# Patient Record
Sex: Female | Born: 1935 | Race: Black or African American | Hispanic: No | Marital: Single | Smoking: Never smoker
Health system: Southern US, Community
[De-identification: ages and names within clinical notes are randomized; demographics above are authoritative.]

## PROBLEM LIST (undated history)

## (undated) DIAGNOSIS — M949 Disorder of cartilage, unspecified: Secondary | ICD-10-CM

## (undated) DIAGNOSIS — Z86010 Personal history of colonic polyps: Secondary | ICD-10-CM

## (undated) DIAGNOSIS — M109 Gout, unspecified: Secondary | ICD-10-CM

## (undated) DIAGNOSIS — R42 Dizziness and giddiness: Secondary | ICD-10-CM

## (undated) DIAGNOSIS — D509 Iron deficiency anemia, unspecified: Secondary | ICD-10-CM

## (undated) DIAGNOSIS — E119 Type 2 diabetes mellitus without complications: Secondary | ICD-10-CM

## (undated) DIAGNOSIS — N259 Disorder resulting from impaired renal tubular function, unspecified: Secondary | ICD-10-CM

## (undated) DIAGNOSIS — Z905 Acquired absence of kidney: Secondary | ICD-10-CM

## (undated) DIAGNOSIS — C649 Malignant neoplasm of unspecified kidney, except renal pelvis: Secondary | ICD-10-CM

## (undated) DIAGNOSIS — I10 Essential (primary) hypertension: Secondary | ICD-10-CM

## (undated) DIAGNOSIS — G4710 Hypersomnia, unspecified: Secondary | ICD-10-CM

## (undated) DIAGNOSIS — M899 Disorder of bone, unspecified: Secondary | ICD-10-CM

## (undated) DIAGNOSIS — F411 Generalized anxiety disorder: Secondary | ICD-10-CM

## (undated) DIAGNOSIS — M1710 Unilateral primary osteoarthritis, unspecified knee: Secondary | ICD-10-CM

## (undated) DIAGNOSIS — S82401A Unspecified fracture of shaft of right fibula, initial encounter for closed fracture: Secondary | ICD-10-CM

## (undated) DIAGNOSIS — F0390 Unspecified dementia without behavioral disturbance: Secondary | ICD-10-CM

## (undated) DIAGNOSIS — G4733 Obstructive sleep apnea (adult) (pediatric): Secondary | ICD-10-CM

## (undated) DIAGNOSIS — N390 Urinary tract infection, site not specified: Secondary | ICD-10-CM

## (undated) DIAGNOSIS — K219 Gastro-esophageal reflux disease without esophagitis: Secondary | ICD-10-CM

## (undated) DIAGNOSIS — E785 Hyperlipidemia, unspecified: Secondary | ICD-10-CM

## (undated) DIAGNOSIS — M79609 Pain in unspecified limb: Secondary | ICD-10-CM

## (undated) HISTORY — DX: Iron deficiency anemia, unspecified: D50.9

## (undated) HISTORY — DX: Hypersomnia, unspecified: G47.10

## (undated) HISTORY — DX: Pain in unspecified limb: M79.609

## (undated) HISTORY — DX: Gout, unspecified: M10.9

## (undated) HISTORY — DX: Generalized anxiety disorder: F41.1

## (undated) HISTORY — DX: Acquired absence of kidney: Z90.5

## (undated) HISTORY — DX: Type 2 diabetes mellitus without complications: E11.9

## (undated) HISTORY — DX: Personal history of colonic polyps: Z86.010

## (undated) HISTORY — DX: Unilateral primary osteoarthritis, unspecified knee: M17.10

## (undated) HISTORY — DX: Obstructive sleep apnea (adult) (pediatric): G47.33

## (undated) HISTORY — DX: Essential (primary) hypertension: I10

## (undated) HISTORY — DX: Dizziness and giddiness: R42

## (undated) HISTORY — DX: Disorder of cartilage, unspecified: M94.9

## (undated) HISTORY — DX: Disorder resulting from impaired renal tubular function, unspecified: N25.9

## (undated) HISTORY — DX: Gastro-esophageal reflux disease without esophagitis: K21.9

## (undated) HISTORY — PX: OTHER SURGICAL HISTORY: SHX169

## (undated) HISTORY — DX: Disorder of bone, unspecified: M89.9

## (undated) HISTORY — DX: Hyperlipidemia, unspecified: E78.5

---

## 1997-11-22 ENCOUNTER — Emergency Department (HOSPITAL_COMMUNITY): Admission: EM | Admit: 1997-11-22 | Discharge: 1997-11-23 | Payer: Self-pay | Admitting: Internal Medicine

## 1997-11-22 ENCOUNTER — Emergency Department (HOSPITAL_COMMUNITY): Admission: EM | Admit: 1997-11-22 | Discharge: 1997-11-22 | Payer: Self-pay | Admitting: Internal Medicine

## 1998-01-11 ENCOUNTER — Inpatient Hospital Stay (HOSPITAL_COMMUNITY): Admission: RE | Admit: 1998-01-11 | Discharge: 1998-01-15 | Payer: Self-pay | Admitting: Urology

## 1999-01-10 ENCOUNTER — Other Ambulatory Visit: Admission: RE | Admit: 1999-01-10 | Discharge: 1999-01-10 | Payer: Self-pay | Admitting: Obstetrics and Gynecology

## 2000-12-08 ENCOUNTER — Inpatient Hospital Stay (HOSPITAL_COMMUNITY): Admission: EM | Admit: 2000-12-08 | Discharge: 2000-12-11 | Payer: Self-pay | Admitting: Emergency Medicine

## 2002-11-06 ENCOUNTER — Emergency Department (HOSPITAL_COMMUNITY): Admission: EM | Admit: 2002-11-06 | Discharge: 2002-11-06 | Payer: Self-pay | Admitting: Emergency Medicine

## 2002-11-06 ENCOUNTER — Encounter: Payer: Self-pay | Admitting: Emergency Medicine

## 2005-02-12 ENCOUNTER — Ambulatory Visit: Payer: Self-pay | Admitting: Internal Medicine

## 2005-06-05 ENCOUNTER — Inpatient Hospital Stay (HOSPITAL_COMMUNITY): Admission: EM | Admit: 2005-06-05 | Discharge: 2005-06-11 | Payer: Self-pay | Admitting: Emergency Medicine

## 2005-06-05 ENCOUNTER — Ambulatory Visit: Payer: Self-pay | Admitting: Internal Medicine

## 2005-06-24 ENCOUNTER — Ambulatory Visit: Payer: Self-pay | Admitting: Internal Medicine

## 2005-08-08 ENCOUNTER — Ambulatory Visit: Payer: Self-pay | Admitting: Internal Medicine

## 2005-12-01 ENCOUNTER — Ambulatory Visit: Payer: Self-pay | Admitting: Internal Medicine

## 2006-09-01 ENCOUNTER — Ambulatory Visit: Payer: Self-pay | Admitting: Internal Medicine

## 2006-09-01 LAB — CONVERTED CEMR LAB
Cholesterol: 145 mg/dL (ref 0–200)
HDL: 29.4 mg/dL — ABNORMAL LOW (ref >39.0)
Hgb A1c MFr Bld: 7.3 % — ABNORMAL HIGH (ref 4.6–6.0)
LDL Cholesterol: 94 mg/dL (ref 0–99)
Pro B Natriuretic peptide (BNP): 42 pg/mL (ref 0.0–100.0)
Total CHOL/HDL Ratio: 4.9
Triglycerides: 108 mg/dL (ref 0–149)
VLDL: 22 mg/dL (ref 0–40)

## 2007-04-22 DIAGNOSIS — E119 Type 2 diabetes mellitus without complications: Secondary | ICD-10-CM

## 2007-04-22 DIAGNOSIS — Z860100 Personal history of colon polyps, unspecified: Secondary | ICD-10-CM | POA: Insufficient documentation

## 2007-04-22 DIAGNOSIS — E785 Hyperlipidemia, unspecified: Secondary | ICD-10-CM

## 2007-04-22 DIAGNOSIS — D509 Iron deficiency anemia, unspecified: Secondary | ICD-10-CM | POA: Insufficient documentation

## 2007-04-22 DIAGNOSIS — G4733 Obstructive sleep apnea (adult) (pediatric): Secondary | ICD-10-CM

## 2007-04-22 DIAGNOSIS — N259 Disorder resulting from impaired renal tubular function, unspecified: Secondary | ICD-10-CM

## 2007-04-22 DIAGNOSIS — M1710 Unilateral primary osteoarthritis, unspecified knee: Secondary | ICD-10-CM | POA: Insufficient documentation

## 2007-04-22 DIAGNOSIS — Z905 Acquired absence of kidney: Secondary | ICD-10-CM

## 2007-04-22 DIAGNOSIS — M109 Gout, unspecified: Secondary | ICD-10-CM

## 2007-04-22 DIAGNOSIS — K219 Gastro-esophageal reflux disease without esophagitis: Secondary | ICD-10-CM

## 2007-04-22 DIAGNOSIS — IMO0002 Reserved for concepts with insufficient information to code with codable children: Secondary | ICD-10-CM

## 2007-04-22 DIAGNOSIS — F411 Generalized anxiety disorder: Secondary | ICD-10-CM | POA: Insufficient documentation

## 2007-04-22 DIAGNOSIS — G4730 Sleep apnea, unspecified: Secondary | ICD-10-CM

## 2007-04-22 DIAGNOSIS — I10 Essential (primary) hypertension: Secondary | ICD-10-CM | POA: Insufficient documentation

## 2007-04-22 DIAGNOSIS — Z794 Long term (current) use of insulin: Secondary | ICD-10-CM

## 2007-04-22 HISTORY — DX: Obstructive sleep apnea (adult) (pediatric): G47.33

## 2007-04-22 HISTORY — DX: Hyperlipidemia, unspecified: E78.5

## 2007-04-22 HISTORY — DX: Disorder resulting from impaired renal tubular function, unspecified: N25.9

## 2007-04-22 HISTORY — DX: Iron deficiency anemia, unspecified: D50.9

## 2007-04-22 HISTORY — DX: Reserved for concepts with insufficient information to code with codable children: IMO0002

## 2007-04-22 HISTORY — DX: Generalized anxiety disorder: F41.1

## 2007-04-22 HISTORY — DX: Gout, unspecified: M10.9

## 2007-04-22 HISTORY — DX: Personal history of colon polyps, unspecified: Z86.0100

## 2007-04-22 HISTORY — DX: Essential (primary) hypertension: I10

## 2007-04-22 HISTORY — DX: Gastro-esophageal reflux disease without esophagitis: K21.9

## 2007-04-22 HISTORY — DX: Type 2 diabetes mellitus without complications: E11.9

## 2007-04-22 HISTORY — DX: Personal history of colonic polyps: Z86.010

## 2007-04-22 HISTORY — DX: Acquired absence of kidney: Z90.5

## 2007-05-06 ENCOUNTER — Emergency Department (HOSPITAL_COMMUNITY): Admission: EM | Admit: 2007-05-06 | Discharge: 2007-05-06 | Payer: Self-pay | Admitting: Emergency Medicine

## 2007-06-04 ENCOUNTER — Encounter: Payer: Self-pay | Admitting: Internal Medicine

## 2007-08-26 ENCOUNTER — Encounter: Payer: Self-pay | Admitting: Internal Medicine

## 2007-08-30 ENCOUNTER — Encounter: Payer: Self-pay | Admitting: Internal Medicine

## 2007-12-13 ENCOUNTER — Telehealth: Payer: Self-pay | Admitting: Internal Medicine

## 2007-12-20 ENCOUNTER — Encounter: Payer: Self-pay | Admitting: Internal Medicine

## 2007-12-23 ENCOUNTER — Encounter (INDEPENDENT_AMBULATORY_CARE_PROVIDER_SITE_OTHER): Payer: Self-pay | Admitting: *Deleted

## 2007-12-23 ENCOUNTER — Ambulatory Visit: Payer: Self-pay | Admitting: Internal Medicine

## 2007-12-23 DIAGNOSIS — M79609 Pain in unspecified limb: Secondary | ICD-10-CM

## 2007-12-23 HISTORY — DX: Pain in unspecified limb: M79.609

## 2007-12-27 ENCOUNTER — Telehealth (INDEPENDENT_AMBULATORY_CARE_PROVIDER_SITE_OTHER): Payer: Self-pay | Admitting: *Deleted

## 2008-01-05 ENCOUNTER — Encounter: Payer: Self-pay | Admitting: Internal Medicine

## 2008-03-06 ENCOUNTER — Telehealth: Payer: Self-pay | Admitting: Internal Medicine

## 2008-04-03 ENCOUNTER — Emergency Department (HOSPITAL_COMMUNITY): Admission: EM | Admit: 2008-04-03 | Discharge: 2008-04-03 | Payer: Self-pay | Admitting: Emergency Medicine

## 2008-05-06 ENCOUNTER — Emergency Department (HOSPITAL_COMMUNITY): Admission: EM | Admit: 2008-05-06 | Discharge: 2008-05-06 | Payer: Self-pay | Admitting: Emergency Medicine

## 2008-05-18 ENCOUNTER — Telehealth: Payer: Self-pay | Admitting: Internal Medicine

## 2008-07-11 ENCOUNTER — Telehealth (INDEPENDENT_AMBULATORY_CARE_PROVIDER_SITE_OTHER): Payer: Self-pay | Admitting: *Deleted

## 2008-11-30 ENCOUNTER — Telehealth: Payer: Self-pay | Admitting: Internal Medicine

## 2008-12-12 ENCOUNTER — Telehealth: Payer: Self-pay | Admitting: Internal Medicine

## 2009-01-04 ENCOUNTER — Encounter: Payer: Self-pay | Admitting: Internal Medicine

## 2009-01-11 ENCOUNTER — Ambulatory Visit: Payer: Self-pay | Admitting: Internal Medicine

## 2009-01-11 DIAGNOSIS — G4710 Hypersomnia, unspecified: Secondary | ICD-10-CM | POA: Insufficient documentation

## 2009-01-11 HISTORY — DX: Hypersomnia, unspecified: G47.10

## 2009-01-18 ENCOUNTER — Ambulatory Visit: Payer: Self-pay | Admitting: Pulmonary Disease

## 2009-01-27 ENCOUNTER — Encounter: Payer: Self-pay | Admitting: Internal Medicine

## 2009-02-09 ENCOUNTER — Telehealth: Payer: Self-pay | Admitting: Internal Medicine

## 2009-02-12 ENCOUNTER — Encounter: Payer: Self-pay | Admitting: Internal Medicine

## 2009-02-12 DIAGNOSIS — M899 Disorder of bone, unspecified: Secondary | ICD-10-CM | POA: Insufficient documentation

## 2009-02-12 DIAGNOSIS — M949 Disorder of cartilage, unspecified: Secondary | ICD-10-CM

## 2009-02-12 HISTORY — DX: Disorder of bone, unspecified: M89.9

## 2009-03-30 ENCOUNTER — Inpatient Hospital Stay (HOSPITAL_COMMUNITY): Admission: EM | Admit: 2009-03-30 | Discharge: 2009-04-05 | Payer: Self-pay | Admitting: Emergency Medicine

## 2009-03-30 ENCOUNTER — Ambulatory Visit: Payer: Self-pay | Admitting: Internal Medicine

## 2009-04-02 ENCOUNTER — Encounter (INDEPENDENT_AMBULATORY_CARE_PROVIDER_SITE_OTHER): Payer: Self-pay | Admitting: Internal Medicine

## 2009-04-02 ENCOUNTER — Ambulatory Visit: Payer: Self-pay | Admitting: Vascular Surgery

## 2009-04-13 ENCOUNTER — Ambulatory Visit: Payer: Self-pay | Admitting: Internal Medicine

## 2009-04-19 ENCOUNTER — Telehealth: Payer: Self-pay | Admitting: Internal Medicine

## 2009-04-20 ENCOUNTER — Ambulatory Visit: Payer: Self-pay | Admitting: Internal Medicine

## 2009-04-20 DIAGNOSIS — R42 Dizziness and giddiness: Secondary | ICD-10-CM

## 2009-04-20 HISTORY — DX: Dizziness and giddiness: R42

## 2009-04-21 ENCOUNTER — Encounter: Payer: Self-pay | Admitting: Internal Medicine

## 2009-04-23 ENCOUNTER — Telehealth: Payer: Self-pay | Admitting: Internal Medicine

## 2009-04-23 LAB — CONVERTED CEMR LAB
BUN: 31 mg/dL — ABNORMAL HIGH (ref 6–23)
Basophils Absolute: 0.1 10*3/uL (ref 0.0–0.1)
Basophils Relative: 0.8 % (ref 0.0–3.0)
CO2: 27 meq/L (ref 19–32)
Calcium: 10 mg/dL (ref 8.4–10.5)
Chloride: 107 meq/L (ref 96–112)
Creatinine, Ser: 1.3 mg/dL — ABNORMAL HIGH (ref 0.4–1.2)
Eosinophils Absolute: 0.3 10*3/uL (ref 0.0–0.7)
Eosinophils Relative: 3.6 % (ref 0.0–5.0)
Folate: 6.8 ng/mL
GFR calc non Af Amer: 51.62 mL/min (ref >60)
Glucose, Bld: 105 mg/dL — ABNORMAL HIGH (ref 70–99)
HCT: 33 % — ABNORMAL LOW (ref 36.0–46.0)
Hemoglobin: 11.3 g/dL — ABNORMAL LOW (ref 12.0–15.0)
Iron: 52 ug/dL (ref 42–145)
Lymphocytes Relative: 16.4 % (ref 12.0–46.0)
Lymphs Abs: 1.3 10*3/uL (ref 0.7–4.0)
MCHC: 34.3 g/dL (ref 30.0–36.0)
MCV: 89.7 fL (ref 78.0–100.0)
Monocytes Absolute: 0.5 10*3/uL (ref 0.1–1.0)
Monocytes Relative: 6.5 % (ref 3.0–12.0)
Neutro Abs: 5.9 10*3/uL (ref 1.4–7.7)
Neutrophils Relative %: 72.7 % (ref 43.0–77.0)
Platelets: 263 10*3/uL (ref 150.0–400.0)
Potassium: 4.3 meq/L (ref 3.5–5.1)
RBC: 3.68 M/uL — ABNORMAL LOW (ref 3.87–5.11)
RDW: 14.1 % (ref 11.5–14.6)
Saturation Ratios: 17.3 % — ABNORMAL LOW (ref 20.0–50.0)
Sodium: 142 meq/L (ref 135–145)
Transferrin: 215.3 mg/dL (ref 212.0–360.0)
Vitamin B-12: 355 pg/mL (ref 211–911)
WBC: 8.1 10*3/uL (ref 4.5–10.5)

## 2009-04-30 ENCOUNTER — Encounter: Payer: Self-pay | Admitting: Internal Medicine

## 2009-05-16 ENCOUNTER — Telehealth: Payer: Self-pay | Admitting: Internal Medicine

## 2009-05-24 ENCOUNTER — Telehealth: Payer: Self-pay | Admitting: Internal Medicine

## 2009-06-21 ENCOUNTER — Encounter: Payer: Self-pay | Admitting: Internal Medicine

## 2009-07-09 ENCOUNTER — Encounter: Payer: Self-pay | Admitting: Internal Medicine

## 2009-07-17 ENCOUNTER — Encounter: Payer: Self-pay | Admitting: Internal Medicine

## 2009-07-17 ENCOUNTER — Telehealth: Payer: Self-pay | Admitting: Internal Medicine

## 2009-07-25 ENCOUNTER — Encounter: Payer: Self-pay | Admitting: Internal Medicine

## 2009-10-23 ENCOUNTER — Encounter: Payer: Self-pay | Admitting: Internal Medicine

## 2009-11-09 ENCOUNTER — Encounter: Payer: Self-pay | Admitting: Internal Medicine

## 2009-11-22 ENCOUNTER — Encounter: Payer: Self-pay | Admitting: Internal Medicine

## 2010-01-31 ENCOUNTER — Telehealth: Payer: Self-pay | Admitting: Internal Medicine

## 2010-02-06 ENCOUNTER — Telehealth: Payer: Self-pay | Admitting: Internal Medicine

## 2010-03-28 ENCOUNTER — Encounter: Payer: Self-pay | Admitting: Internal Medicine

## 2010-06-02 ENCOUNTER — Observation Stay (HOSPITAL_COMMUNITY)
Admission: EM | Admit: 2010-06-02 | Discharge: 2010-06-04 | Payer: Self-pay | Source: Home / Self Care | Attending: Internal Medicine | Admitting: Internal Medicine

## 2010-06-23 ENCOUNTER — Encounter: Payer: Self-pay | Admitting: Internal Medicine

## 2010-06-24 ENCOUNTER — Encounter: Payer: Self-pay | Admitting: Internal Medicine

## 2010-07-04 NOTE — Progress Notes (Signed)
Summary: call expected  Phone Note Call from Patient Call back at Home Phone [phone]   Summary of Call: FYI--[facility], and they will give [place] a call in regards to the patient. Initial call taken by: Lucious Groves,  July 17, 2009 4:54 PM  Follow-up for Phone Call        Closed phone note, paperwork for patient diabetes supplies has been completed. Follow-up by: Lucious Groves,  July 23, 2009 10:30 AM

## 2010-07-04 NOTE — Medication Information (Signed)
Summary: Glucose Testing Supplies/[facility]  Glucose Testing Supplies/[facility]   Imported By: Sherian Rein 07/10/2009 10:46:51  _____________________________________________________________________  External Attachment:    Type:   Image     Comment:   External Document

## 2010-07-04 NOTE — Medication Information (Signed)
Summary: [facility]  [facility]   Imported By: Lester Armington 07/21/2009 08:22:14  _____________________________________________________________________  External Attachment:    Type:   Image     Comment:   External Document

## 2010-07-04 NOTE — Letter (Signed)
Summary: Request for medical records/[facility]  Request for medical records/[facility]   Imported By: Sherian Rein 11/26/2009 10:21:07  _____________________________________________________________________  External Attachment:    Type:   Image     Comment:   External Document

## 2010-07-04 NOTE — Medication Information (Signed)
[facility]   Imported By: Lester Perry 07/30/2009 08:03:13  _____________________________________________________________________  External Attachment:    Type:   Image     Comment:   External Document

## 2010-07-04 NOTE — Medication Information (Signed)
Summary: Diabetes Supplies / [facility]  Diabetes Supplies / [facility]   Imported By: Lennie Odor 04/01/2010 09:50:41  _____________________________________________________________________  External Attachment:    Type:   Image     Comment:   External Document

## 2010-07-04 NOTE — Medication Information (Signed)
Summary: [facility]   Imported By: Lester Woodland 11/13/2009 13:20:54  _____________________________________________________________________  External Attachment:    Type:   Image     Comment:   External Document

## 2010-07-04 NOTE — Progress Notes (Signed)
Summary: medication refll  Phone Note Refill Request Message from:  Fax from Pharmacy on January 31, 2010 10:18 AM  Refills Requested: Medication #1:  LORAZEPAM 1 MG  TABS 1 by mouth two times a day as needed nerves   Dosage confirmed as above?Dosage Confirmed   Last Refilled: 10/18/2009   Notes: [facility] [phone] Initial call taken by: Zella Ball Ewing CMA Duncan Dull),  January 31, 2010 10:19 AM  Follow-up for Phone Call        done hardcopy to [facility] side B - dahlia  Follow-up by: Corwin Levins MD,  January 31, 2010 1:20 PM  Additional Follow-up for Phone Call Additional follow up Details #1::        Rx faxed to pharmacy Additional Follow-up by: Margaret Pyle, CMA,  January 31, 2010 1:21 PM    New/Updated Medications: LORAZEPAM 1 MG  TABS (LORAZEPAM) 1 by mouth two times a day as needed nerves - needs return office visit for further refills please Prescriptions: LORAZEPAM 1 MG  TABS (LORAZEPAM) 1 by mouth two times a day as needed nerves - needs return office visit for further refills please  #60 x 2   Entered and Authorized by:   Corwin Levins MD   Signed by:   Corwin Levins MD on 01/31/2010   Method used:   Print then Give to Patient   RxID:   [id number]

## 2010-07-04 NOTE — Medication Information (Signed)
Summary: [facility]  [facility]  [facility]  [facility]   Imported By: Lester Cimarron 07/27/2009 07:28:59  _____________________________________________________________________  External Attachment:    Type:   Image     Comment:   External Document

## 2010-07-04 NOTE — Letter (Signed)
Summary: [facility]  [facility]   Imported By: Sherian Rein 10/25/2009 12:20:53  _____________________________________________________________________  External Attachment:    Type:   Image     Comment:   External Document

## 2010-07-04 NOTE — Medication Information (Signed)
Summary: Diabetic Supplies/[facility]  Di[facility]   Imported By: Sherian Rein 07/26/2009 12:53:59  _____________________________________________________________________  External Attachment:    Type:   Image     Comment:   External Document

## 2010-07-04 NOTE — Letter (Signed)
Summary: CMN for Diabetes Supplies/[facility]  CMN for Diabetes Supplies/[facility]   Imported By: Sherian Rein 06/26/2009 08:22:02  _____________________________________________________________________  External Attachment:    Type:   Image     Comment:   External Document

## 2010-07-04 NOTE — Progress Notes (Signed)
  Phone Note Refill Request Message from:  Fax from Pharmacy on February 06, 2010 3:26 PM  Refills Requested: Medication #1:  KLOR-CON M20 20 MEQ  TBCR 1 by mouth once daily   Dosage confirmed as above?Dosage Confirmed   Notes: [facility] Initial call taken by: Scharlene Gloss CMA Duncan Dull),  February 06, 2010 3:26 PM    Prescriptions: KLOR-CON M20 20 MEQ  TBCR (POTASSIUM CHLORIDE CRYS CR) 1 by mouth once daily  #180 x 0   Entered by:   Scharlene Gloss CMA (AAMA)   Authorized by:   Corwin Levins MD   Signed by:   Scharlene Gloss CMA (AAMA) on 02/06/2010   Method used:   Faxed to ...       [facility]* (retail)       [address]       [place], [place]  [id number]       Ph: [phone]       Fax: [phone]   RxID:   [id number]

## 2010-07-17 ENCOUNTER — Telehealth: Payer: Self-pay | Admitting: Internal Medicine

## 2010-07-24 NOTE — Progress Notes (Signed)
  Phone Note Refill Request Message from:  Fax from Pharmacy on July 17, 2010 4:48 PM  Refills Requested: Medication #1:  GLIPIZIDE 10 MG TB24 take 2 by mouth once daily   Dosage confirmed as above?Dosage Confirmed   Notes: [facility] Initial call taken by: Scharlene Gloss CMA Duncan Dull),  July 17, 2010 4:48 PM    Prescriptions: GLIPIZIDE 10 MG TB24 (GLIPIZIDE) take 2 by mouth once daily  #60 x 0   Entered by:   Scharlene Gloss CMA (AAMA)   Authorized by:   Corwin Levins MD   Signed by:   Scharlene Gloss CMA (AAMA) on 07/17/2010   Method used:   Faxed to ...       [facility]* (retail)       [address]       [place], [place]  [id number]       Ph: [phone]       Fax: [phone]   RxID:   [id number]

## 2010-08-12 LAB — POCT I-STAT, CHEM 8
BUN: 16 mg/dL (ref 6–23)
Calcium, Ion: 1.22 mmol/L (ref 1.12–1.32)
Chloride: 109 mEq/L (ref 96–112)
Creatinine, Ser: 1.3 mg/dL — ABNORMAL HIGH (ref 0.4–1.2)
Glucose, Bld: 129 mg/dL — ABNORMAL HIGH (ref 70–99)
HCT: 26 % — ABNORMAL LOW (ref 36.0–46.0)
Hemoglobin: 8.8 g/dL — ABNORMAL LOW (ref 12.0–15.0)
Potassium: 3.8 mEq/L (ref 3.5–5.1)
Sodium: 142 mEq/L (ref 135–145)
TCO2: 25 mmol/L (ref 0–100)

## 2010-08-12 LAB — DIFFERENTIAL
Basophils Absolute: 0 10*3/uL (ref 0.0–0.1)
Basophils Relative: 0 % (ref 0–1)
Eosinophils Absolute: 0.1 10*3/uL (ref 0.0–0.7)
Eosinophils Relative: 1 % (ref 0–5)
Lymphocytes Relative: 13 % (ref 12–46)
Lymphs Abs: 1.6 10*3/uL (ref 0.7–4.0)
Monocytes Absolute: 1.2 10*3/uL — ABNORMAL HIGH (ref 0.1–1.0)
Monocytes Relative: 10 % (ref 3–12)
Neutro Abs: 9.5 10*3/uL — ABNORMAL HIGH (ref 1.7–7.7)
Neutrophils Relative %: 76 % (ref 43–77)

## 2010-08-12 LAB — COMPREHENSIVE METABOLIC PANEL
ALT: 26 U/L (ref 0–35)
AST: 19 U/L (ref 0–37)
Albumin: 2.5 g/dL — ABNORMAL LOW (ref 3.5–5.2)
Alkaline Phosphatase: 93 U/L (ref 39–117)
BUN: 18 mg/dL (ref 6–23)
CO2: 26 mEq/L (ref 19–32)
Calcium: 9.5 mg/dL (ref 8.4–10.5)
Chloride: 108 mEq/L (ref 96–112)
Creatinine, Ser: 1.33 mg/dL — ABNORMAL HIGH (ref 0.4–1.2)
GFR calc Af Amer: 47 mL/min — ABNORMAL LOW (ref >60)
GFR calc non Af Amer: 39 mL/min — ABNORMAL LOW (ref >60)
Glucose, Bld: 147 mg/dL — ABNORMAL HIGH (ref 70–99)
Potassium: 3.9 mEq/L (ref 3.5–5.1)
Sodium: 143 mEq/L (ref 135–145)
Total Bilirubin: 0.7 mg/dL (ref 0.3–1.2)
Total Protein: 6.9 g/dL (ref 6.0–8.3)

## 2010-08-12 LAB — URINALYSIS, ROUTINE W REFLEX MICROSCOPIC
Bilirubin Urine: NEGATIVE
Glucose, UA: NEGATIVE mg/dL
Hgb urine dipstick: NEGATIVE
Ketones, ur: NEGATIVE mg/dL
Nitrite: NEGATIVE
Protein, ur: NEGATIVE mg/dL
Specific Gravity, Urine: 1.016 (ref 1.005–1.030)
Urobilinogen, UA: 0.2 mg/dL (ref 0.0–1.0)
pH: 5 (ref 5.0–8.0)

## 2010-08-12 LAB — LIPID PANEL
Cholesterol: 115 mg/dL (ref 0–200)
HDL: 36 mg/dL — ABNORMAL LOW (ref >39)
Total CHOL/HDL Ratio: 3.2 RATIO
VLDL: 15 mg/dL (ref 0–40)

## 2010-08-12 LAB — CULTURE, BLOOD (ROUTINE X 2)
Culture  Setup Time: 201201021123
Culture  Setup Time: 201201021123
Culture: NO GROWTH
Culture: NO GROWTH

## 2010-08-12 LAB — CBC
HCT: 27.6 % — ABNORMAL LOW (ref 36.0–46.0)
HCT: 29 % — ABNORMAL LOW (ref 36.0–46.0)
Hemoglobin: 8.8 g/dL — ABNORMAL LOW (ref 12.0–15.0)
Hemoglobin: 9.2 g/dL — ABNORMAL LOW (ref 12.0–15.0)
MCH: 28.9 pg (ref 26.0–34.0)
MCH: 29 pg (ref 26.0–34.0)
MCHC: 31.7 g/dL (ref 30.0–36.0)
MCHC: 31.9 g/dL (ref 30.0–36.0)
MCV: 90.8 fL (ref 78.0–100.0)
MCV: 91.5 fL (ref 78.0–100.0)
Platelets: 321 10*3/uL (ref 150–400)
Platelets: 328 10*3/uL (ref 150–400)
RBC: 3.04 MIL/uL — ABNORMAL LOW (ref 3.87–5.11)
RBC: 3.17 MIL/uL — ABNORMAL LOW (ref 3.87–5.11)
RDW: 14.2 % (ref 11.5–15.5)
RDW: 14.3 % (ref 11.5–15.5)
WBC: 11.3 10*3/uL — ABNORMAL HIGH (ref 4.0–10.5)
WBC: 12.4 10*3/uL — ABNORMAL HIGH (ref 4.0–10.5)

## 2010-08-12 LAB — GLUCOSE, CAPILLARY
Glucose-Capillary: 130 mg/dL — ABNORMAL HIGH (ref 70–99)
Glucose-Capillary: 130 mg/dL — ABNORMAL HIGH (ref 70–99)
Glucose-Capillary: 180 mg/dL — ABNORMAL HIGH (ref 70–99)
Glucose-Capillary: 233 mg/dL — ABNORMAL HIGH (ref 70–99)

## 2010-08-12 LAB — HEMOGLOBIN A1C
Hgb A1c MFr Bld: 6.4 % — ABNORMAL HIGH (ref <5.7)
Mean Plasma Glucose: 137 mg/dL — ABNORMAL HIGH (ref <117)

## 2010-08-12 LAB — TSH: TSH: 2.199 u[IU]/mL (ref 0.350–4.500)

## 2010-08-12 LAB — URIC ACID: Uric Acid, Serum: 8.8 mg/dL — ABNORMAL HIGH (ref 2.4–7.0)

## 2010-08-20 ENCOUNTER — Other Ambulatory Visit: Payer: Self-pay | Admitting: Internal Medicine

## 2010-08-21 ENCOUNTER — Other Ambulatory Visit: Payer: Self-pay | Admitting: Internal Medicine

## 2010-08-21 NOTE — Telephone Encounter (Signed)
To robin   

## 2010-08-22 ENCOUNTER — Other Ambulatory Visit: Payer: Self-pay | Admitting: Internal Medicine

## 2010-08-30 ENCOUNTER — Other Ambulatory Visit: Payer: Self-pay

## 2010-08-30 MED ORDER — GLIPIZIDE ER 10 MG PO TB24: 10.0000 mg | ORAL_TABLET | Freq: Two times a day (BID) | ORAL | Status: DC

## 2010-09-03 ENCOUNTER — Ambulatory Visit: Payer: Self-pay | Admitting: Internal Medicine

## 2010-09-04 LAB — CBC
Hemoglobin: 9.6 g/dL — ABNORMAL LOW (ref 12.0–15.0)
MCV: 90.8 fL (ref 78.0–100.0)
RBC: 3.14 MIL/uL — ABNORMAL LOW (ref 3.87–5.11)
WBC: 7.7 10*3/uL (ref 4.0–10.5)

## 2010-09-04 LAB — GLUCOSE, CAPILLARY
Glucose-Capillary: 124 mg/dL — ABNORMAL HIGH (ref 70–99)
Glucose-Capillary: 126 mg/dL — ABNORMAL HIGH (ref 70–99)
Glucose-Capillary: 131 mg/dL — ABNORMAL HIGH (ref 70–99)
Glucose-Capillary: 143 mg/dL — ABNORMAL HIGH (ref 70–99)
Glucose-Capillary: 146 mg/dL — ABNORMAL HIGH (ref 70–99)
Glucose-Capillary: 148 mg/dL — ABNORMAL HIGH (ref 70–99)
Glucose-Capillary: 156 mg/dL — ABNORMAL HIGH (ref 70–99)
Glucose-Capillary: 164 mg/dL — ABNORMAL HIGH (ref 70–99)
Glucose-Capillary: 166 mg/dL — ABNORMAL HIGH (ref 70–99)

## 2010-09-04 LAB — BASIC METABOLIC PANEL
CO2: 27 mEq/L (ref 19–32)
Chloride: 106 mEq/L (ref 96–112)
Creatinine, Ser: 1.93 mg/dL — ABNORMAL HIGH (ref 0.4–1.2)
GFR calc Af Amer: 31 mL/min — ABNORMAL LOW (ref >60)
Sodium: 140 mEq/L (ref 135–145)

## 2010-09-05 LAB — URINALYSIS, MICROSCOPIC ONLY
Hgb urine dipstick: NEGATIVE
Nitrite: NEGATIVE
Specific Gravity, Urine: 1.017 (ref 1.005–1.030)
Urobilinogen, UA: 0.2 mg/dL (ref 0.0–1.0)

## 2010-09-05 LAB — GLUCOSE, CAPILLARY
Glucose-Capillary: 127 mg/dL — ABNORMAL HIGH (ref 70–99)
Glucose-Capillary: 138 mg/dL — ABNORMAL HIGH (ref 70–99)

## 2010-09-05 LAB — DIFFERENTIAL
Basophils Absolute: 0 10*3/uL (ref 0.0–0.1)
Eosinophils Relative: 2 % (ref 0–5)
Lymphocytes Relative: 12 % (ref 12–46)
Neutro Abs: 7.4 10*3/uL (ref 1.7–7.7)
Neutrophils Relative %: 80 % — ABNORMAL HIGH (ref 43–77)

## 2010-09-05 LAB — COMPREHENSIVE METABOLIC PANEL
Alkaline Phosphatase: 54 U/L (ref 39–117)
BUN: 16 mg/dL (ref 6–23)
BUN: 18 mg/dL (ref 6–23)
CO2: 28 mEq/L (ref 19–32)
CO2: 31 mEq/L (ref 19–32)
Chloride: 105 mEq/L (ref 96–112)
Creatinine, Ser: 1.18 mg/dL (ref 0.4–1.2)
GFR calc non Af Amer: 45 mL/min — ABNORMAL LOW (ref >60)
GFR calc non Af Amer: 50 mL/min — ABNORMAL LOW (ref >60)
Glucose, Bld: 131 mg/dL — ABNORMAL HIGH (ref 70–99)
Glucose, Bld: 144 mg/dL — ABNORMAL HIGH (ref 70–99)
Potassium: 3.2 mEq/L — ABNORMAL LOW (ref 3.5–5.1)
Total Bilirubin: 0.7 mg/dL (ref 0.3–1.2)
Total Protein: 5.9 g/dL — ABNORMAL LOW (ref 6.0–8.3)

## 2010-09-05 LAB — HOMOCYSTEINE: Homocysteine: 12.8 umol/L (ref 4.0–15.4)

## 2010-09-05 LAB — URINALYSIS, ROUTINE W REFLEX MICROSCOPIC
Bilirubin Urine: NEGATIVE
Nitrite: NEGATIVE
Protein, ur: NEGATIVE mg/dL
Urobilinogen, UA: 0.2 mg/dL (ref 0.0–1.0)

## 2010-09-05 LAB — CBC
HCT: 32 % — ABNORMAL LOW (ref 36.0–46.0)
HCT: 34.1 % — ABNORMAL LOW (ref 36.0–46.0)
Hemoglobin: 10.6 g/dL — ABNORMAL LOW (ref 12.0–15.0)
MCHC: 33 g/dL (ref 30.0–36.0)
MCHC: 33.3 g/dL (ref 30.0–36.0)
MCV: 90.7 fL (ref 78.0–100.0)
RBC: 3.76 MIL/uL — ABNORMAL LOW (ref 3.87–5.11)
RDW: 15.2 % (ref 11.5–15.5)
WBC: 9.2 10*3/uL (ref 4.0–10.5)

## 2010-09-05 LAB — LIPID PANEL
HDL: 26 mg/dL — ABNORMAL LOW (ref >39)
Triglycerides: 128 mg/dL (ref <150)

## 2010-09-05 LAB — URINE CULTURE: Culture: NO GROWTH

## 2010-09-05 LAB — HEMOGLOBIN A1C
Hgb A1c MFr Bld: 6.5 % — ABNORMAL HIGH (ref 4.6–6.1)
Mean Plasma Glucose: 140 mg/dL

## 2010-09-05 LAB — POCT CARDIAC MARKERS: Myoglobin, poc: 142 ng/mL (ref 12–200)

## 2010-09-27 ENCOUNTER — Other Ambulatory Visit: Payer: Self-pay | Admitting: Internal Medicine

## 2010-09-27 NOTE — Telephone Encounter (Signed)
Faxed hardcopy to pharmacy. 

## 2010-10-18 NOTE — Discharge Summary (Signed)
Katie Dawson, Katie Dawson                ACCOUNT NO.:  [id number]   MEDICAL RECORD NO.:  [id number]          PATIENT TYPE:  INP   LOCATION:  3038                         FACILITY:  [facility]   PHYSICIAN:  Rene Paci, M.D. LHCDATE OF BIRTH:  [birth year]   DATE OF ADMISSION:  06/05/2005  DATE OF DISCHARGE:  06/11/2005                                 DISCHARGE SUMMARY   DISCHARGE DIAGNOSES:  1.  Acute community acquired pneumonia, right lower lobe, improved.      Discharge O2 sats 95% on room air, tolerating p.o. antibiotics to      continue Ceftin for 2 week total treatment.  2.  Mild chronic renal insufficiency with solitary kidney status post right      nephrectomy secondary to mass in 2000.  3.  Acute flare of gout, left foot, improved continued p.o. prednisone      taper.  4.  History of hypertension, apparently severe with mild hypotension this      hospitalization.  Decrease medications as described below.  5.  Type II diabetes.  Slight exacerbation with steroids, but overall well      controlled.  6.  History of iron deficiency anemia with chronic disease secondary to      renal insufficiency.  7.  History of dyslipidemia.  8.  History of anxiety.  9.  History of right humerus fracture in September of 2002.  10. History of obstructive sleep apnea, not on CPAP.  11. History of chronic constipation.   DISCHARGE MEDICATIONS:  1.  Ceftin 500 mg p.o. b.i.d. x 5 days.  2.  Prednisone 10 mg tablets with 6 day taper from 60 mg, then 50 mg, then      40 mg, etc.  3.  Mucinex 1200 mg p.o. b.i.d. x 5 days, then p.r.n.  4.  Tussionex syrup 1 tsp q.8 hours p.r.n. severe cough.   Otherwise the medications are basically as prior to admission including:  1.  Iron 325 p.o. b.i.d.  2.  Stool softener b.i.d.  3.  Lasix 40 mg q. day.  4.  M-Dur 60 mg p.o. q.p.m.  5.  Labetalol 400 mg b.i.d.  6.  Hydralazine 25 mg t.i.d.  7.  Clonidine 0.3 mg b.i.d.  8.  Avandia 4 mg q. a.m.  9.  Potassium  20 mEq p.o. q. a.m.  10. Diovan HCTZ 80/12.5 p.o. q. a.m.  11. Lipitor 10 mg p.o. q.h.s.  12. Glucotrol 10 mg 1 q. a.m. (not 2 q. a.m. as prior to admission).  13. Norvasc 10 mg q. a.m. (not b.i.d. as prior to admission).  14. Lotensin 40 mg q. a.m. (not b.i.d. as prior to admission).  15. Lorazepam 0.5 mg p.o. q.4 hours p.r.n. anxiety.   Hospital follow up scheduled with primary care physician, Dr. Efrain Sella for  Friday January 19 at 2:15 p.m. to review pneumonia and other symptomatic  control as well as chronic medical issues.  The patient is also instructed  to call her nephrologist in [facility] in the next 2-3 weeks in follow up as  needed especially regarding control  of blood pressure.   CONDITION ON DISCHARGE:  Medically improved.  Tolerating p.o.  Ambulating  independently.   HOSPITAL COURSE:  1.  Acute pneumonia.  The patient is a pleasant 77 year old woman with      multiple medical issues as listed above who came to the emergency room      on the day of admission complaining of shortness of breath and fever and      chest tightness.  X-rays showed right lower lobe infiltration and she      had a white count of 13.6 with low grade fever.  She was begun on IV      Rocephin and Azithromycin as well as symptomatic treatment of Tussionex      and Mucinex plus p.r.n. O2 and nebs.  She persisted with cough and      shortness of breath for several days but was eventually improved and      changed to p.o. Ceftin which she has tolerated well after 4 days of IV      antibiotics.  At this time she has been weaned from O2.  She is using an      inhaler only as needed much less symptomatic with Mucinex and Tussionex      as listed above.  2.  Acute gout flare.  On the onset of hospitalization, the patient began to      complain of swelling and pain in her left foot inhibiting ambulation.      She has a history of gout and symptoms consistent with gout on physical      exam during this  time.  She was given an IV dose of Solu-Medrol over 24      hours with improvement of her symptoms and then changed to p.o.      prednisone taper as described above.  She is now ambulatory      independently with use of a walker as needed which is the same as her      status as prior to admission.  Of note she is not on any allopurinol or      Colchicine for prophylaxis.  We will defer to primary M.D. and/or renal      physician to address depending on episodic flares.  3.  Type II diabetes.  Even prior to steroids being initiated, the patient      had mild hypoglycemia on her home regimen.  As such Glucotrol was      decreased from 20 mg daily to 10 mg daily.  She did well on this      regimen, even on steroids.  Outpatient follow up to avoid over treatment      of diabetes.  4.  Hypertension.  Likewise, on patient's usual home regimen, the patient      had mild hypotension with systolic pressure in the 90's to 100 even in      the absence of Sepsis type symptoms.  Norvasc was reduced as with her      Lotensin which she has tolerated well.  She has not had any dizziness      and her systolic pressures are in the 161'W which she feels is normal      for her.  Continue other medications for what appears to be a history of      aggressive malignant hypertension with outpatient follow up per renal      M.D. whom she says follows her blood pressure.  5.  Other medical issues.  Patient's other medical issues are as listed      above and other medications are without change.      Rene Paci, M.D. [facility]  Electronically Signed     VL/MEDQ  D:  06/11/2005  T:  06/11/2005  Job:  [id number]

## 2010-10-18 NOTE — Discharge Summary (Signed)
[place]  Patient:    Katie Dawson, Katie Dawson                    MRN: [id number] Adm. Date:  [id number] Disc. Date: [id number] Attending:  Pierce Crane Dictator:   Sammuel Cooper. Mahar, P.A.                           Discharge Summary  DATE OF BIRTH:  2033/10/24  ADMISSION DIAGNOSES: 1. Right humeral neck fracture. 2. Type 2 diabetes mellitus. 3. Hypertension. 4. Right nephrectomy.  DISCHARGE DIAGNOSES: 1. Right humeral neck fracture, non-operative, currently in a sling. 2. Type 2 diabetes mellitus. 3. Hypertension. 4. Right nephrectomy.  PROCEDURES:  None.  CONSULTS:  None.  BRIEF HISTORY:  The patient is a 77 year old female who fell while getting out of her daughters car.  Her foot got tripped around the door, causing her fall out of the car onto the right elbow.  She had immediate onset of pain in her right upper extremity.  She was brought to the [facility] Emergency Department where x-rays revealed a right humeral neck fracture.  She was seen in the emergency department with a moderate amount of shoulder pain and admitted for observation and pain control purposes.  LABORATORY DATA:  A UA done on December 10, 2000, was negative.  X-RAYS:  X-rays done on December 08, 2000, of the humerus and right shoulder revealed a proximal right humerus fracture.  There was a transverse fracture across the surgical neck of the humerus.  This was impacted with posterior angulation of the shaft with respect to the humeral head.  Minimal degenerative changes in the acromioclavicular joint were noted.  The humerus with the surgical neck fracture as detailed above with impaction and angulation.  No additional bone abnormalities were identified in the humerus.   ELECTROCARDIOGRAPHY:  There was no EKG seen on the chart.  HOSPITAL COURSE:  On December 08, 2000, the patient was admitted for pain control management of the right humerus fracture.   The patient was non-operative. Physical therapy, as well as occupational therapy were consulted to assist with management of this patient to teach her activities of daily living and help her with ambulation purposes.  Both physical therapy and occupational therapy recommended that the patient will need some assistance at home for activities of daily living and 24-hour assistance initially secondary to some balance problems with her positioning the sling.  Discharge planning and care management were consulted to assist with the patients home needs and setting up some things for her for assistance at home.  On December 09, 2000, the patient was experiencing some dizziness that she thought was possibly secondary to the pain medications that she was on.  This continued to December 10, 2000.  She continued having some dizziness.  The dizziness was thought maybe secondary to the narcotics.  Therefore, we decreased narcotic use, but changed it to Darvocet in an attempt to relieve this patients dizziness as that was the risk for further falls.  It was decided to keep her overnight one more night until this dizziness was cleared up and the patient would be safe to go home and ambulate safely.  She did continue to have problems with pain management throughout this day.  The Darvocet was only holding her for about two to three hours before she was requesting another pain pill and we did not want  to put her back on the Percocet because it was causing the dizziness.  She was no longer dizzy when she was taking the Darvocet.  On December 11, 2000, the dizziness had cleared up.  Her only complaint at that point was just difficulty getting in and out of bed.  She appeared comfortable on examination.  Neurovascular checks were done on this patient throughout her hospital stay and she remained neurovascularly in the right upper extremity. She was hesitant to go home on December 11, 2000, secondary to she thought she would  have difficulty getting in and out of bed.  I did discuss at length with her that we would sent her home with assistance and that family members would be there throughout the weekend to assist her with her needs and assist her with activities of daily living as she needed.  At the conclusion of her conversation, she felt much more comfortable with going home and thought she would be ready to go home later that day.  In the afternoon on December 11, 2000, I did speak with the patients family members also regarding her home needs and her need for some assistance with activities of daily living.  It was felt that she would be safe to go home and able to ambulate without any assistance. [facility] was contacted for home physical therapy, as well as occupational therapy to begin upon discharge of this patient.  FOLLOW-UP:  Follow up in one week with Javier Docker, M.D.  Call for an appointment.  ACTIVITY:  The right upper extremity is to remain in an immobilizer at all times.  Nonweightbearing to the right upper extremity.  Physical therapy, occupational therapy, and an aid will be set up for the patients home needs through [facility].  The patient may apply ice to the right shoulder as needed.  No motion to the right upper extremity.  DIET:  Recommended ADA 1800 calorie diet.  Also recommend a low-sodium diet as tolerated.  MEDICATIONS ON DISCHARGE: 1. Darvocet-N, #40, one to two p.o. q.4-6h. p.r.n. pain. 2. Robaxin 500 mg one p.o. q.6-8h. p.r.n. pain, #20.  DISPOSITION:  The patient is being discharged to her home with home health physical therapy, occupational therapy, and an aid through [facility].  CONDITION ON DISCHARGE:  Stable and improved. DD:  12/25/00 TD:  12/26/00 Job: [id number]

## 2010-10-29 ENCOUNTER — Other Ambulatory Visit: Payer: Self-pay | Admitting: Internal Medicine

## 2010-10-31 ENCOUNTER — Other Ambulatory Visit: Payer: Self-pay | Admitting: Internal Medicine

## 2010-11-03 ENCOUNTER — Encounter: Payer: Self-pay | Admitting: Internal Medicine

## 2010-11-03 DIAGNOSIS — Z0000 Encounter for general adult medical examination without abnormal findings: Secondary | ICD-10-CM | POA: Insufficient documentation

## 2010-11-05 ENCOUNTER — Encounter: Payer: Self-pay | Admitting: Internal Medicine

## 2010-11-05 ENCOUNTER — Other Ambulatory Visit (INDEPENDENT_AMBULATORY_CARE_PROVIDER_SITE_OTHER): Payer: Medicare Other

## 2010-11-05 ENCOUNTER — Other Ambulatory Visit: Payer: Self-pay | Admitting: Internal Medicine

## 2010-11-05 ENCOUNTER — Ambulatory Visit (INDEPENDENT_AMBULATORY_CARE_PROVIDER_SITE_OTHER): Payer: Medicare Other | Admitting: Internal Medicine

## 2010-11-05 VITALS — BP 154/80 | HR 71 | Temp 97.8°F | Ht 65.0 in | Wt 295.2 lb

## 2010-11-05 DIAGNOSIS — Z23 Encounter for immunization: Secondary | ICD-10-CM

## 2010-11-05 DIAGNOSIS — M109 Gout, unspecified: Secondary | ICD-10-CM

## 2010-11-05 DIAGNOSIS — R5381 Other malaise: Secondary | ICD-10-CM

## 2010-11-05 DIAGNOSIS — Z0000 Encounter for general adult medical examination without abnormal findings: Secondary | ICD-10-CM

## 2010-11-05 DIAGNOSIS — R5383 Other fatigue: Secondary | ICD-10-CM | POA: Insufficient documentation

## 2010-11-05 DIAGNOSIS — D509 Iron deficiency anemia, unspecified: Secondary | ICD-10-CM

## 2010-11-05 DIAGNOSIS — E119 Type 2 diabetes mellitus without complications: Secondary | ICD-10-CM

## 2010-11-05 LAB — FOLATE: Folate: 5.4 ng/mL — ABNORMAL LOW (ref >5.9)

## 2010-11-05 LAB — CBC WITH DIFFERENTIAL/PLATELET
Basophils Relative: 0.4 % (ref 0.0–3.0)
Eosinophils Relative: 2.6 % (ref 0.0–5.0)
HCT: 35.4 % — ABNORMAL LOW (ref 36.0–46.0)
Hemoglobin: 12 g/dL (ref 12.0–15.0)
Lymphocytes Relative: 15.7 % (ref 12.0–46.0)
Lymphs Abs: 1.7 10*3/uL (ref 0.7–4.0)
Monocytes Relative: 5.5 % (ref 3.0–12.0)
Neutro Abs: 8 10*3/uL — ABNORMAL HIGH (ref 1.4–7.7)
RBC: 4.02 Mil/uL (ref 3.87–5.11)
WBC: 10.6 10*3/uL — ABNORMAL HIGH (ref 4.5–10.5)

## 2010-11-05 LAB — BASIC METABOLIC PANEL
Calcium: 9.8 mg/dL (ref 8.4–10.5)
GFR: 67.25 mL/min (ref >60.00)
Glucose, Bld: 228 mg/dL — ABNORMAL HIGH (ref 70–99)
Potassium: 4.4 mEq/L (ref 3.5–5.1)
Sodium: 140 mEq/L (ref 135–145)

## 2010-11-05 LAB — VITAMIN B12: Vitamin B-12: 354 pg/mL (ref 211–911)

## 2010-11-05 LAB — LIPID PANEL
HDL: 36.2 mg/dL — ABNORMAL LOW (ref >39.00)
Total CHOL/HDL Ratio: 4
VLDL: 42.4 mg/dL — ABNORMAL HIGH (ref 0.0–40.0)

## 2010-11-05 LAB — IBC PANEL: Saturation Ratios: 23.1 % (ref 20.0–50.0)

## 2010-11-05 LAB — TSH: TSH: 2 u[IU]/mL (ref 0.35–5.50)

## 2010-11-05 MED ORDER — TETANUS-DIPHTH-ACELL PERTUSSIS 5-2.5-18.5 LF-MCG/0.5 IM SUSP
0.5000 mL | Freq: Once | INTRAMUSCULAR | Status: AC
  Administered 2010-11-05: 0.5 mL via INTRAMUSCULAR

## 2010-11-05 NOTE — Assessment & Plan Note (Signed)
stable overall by hx and exam, most recent data reviewed with pt, and pt to continue medical treatment as before  Lab Results  Component Value Date   HGBA1C  Value: 6.4 (NOTE)                                                                       According to the ADA Clinical Practice Recommendations for 2011, when HbA1c is used as a screening test:   >=6.5%   Diagnostic of Diabetes Mellitus           (if abnormal result  is confirmed)  5.7-6.4%   Increased risk of developing Diabetes Mellitus  References:Diagnosis and Classification of Diabetes Mellitus,Diabetes Care,2011,34(Suppl 1):S62-S69 and Standards of Medical Care in         Diabetes - 2011,Diabetes Care,2011,34  (Suppl 1):S11-S61.* 06/03/2010

## 2010-11-05 NOTE — Assessment & Plan Note (Signed)
Due for f/u colonoscopy (was supposed to return at one yr, due to mult polyps the first time), and pt plans to review her papers at home, and call for appt

## 2010-11-05 NOTE — Progress Notes (Signed)
Subjective:    Patient ID: Katie Dawson, female    DOB: 1934-03-31, 77 y.o.   MRN: [id number]  HPI Here to f/u; overall doing ok,  Pt denies chest pain, increased sob or doe, wheezing, orthopnea, PND, increased LE swelling, palpitations, dizziness or syncope.  Pt denies new neurological symptoms such as new headache, or facial or extremity weakness or numbness   Pt denies polydipsia, polyuria, or low sugar symptoms such as weakness or confusion improved with po intake.  Pt states overall good compliance with meds, trying to follow lower cholesterol, diabetic diet, wt overall stable but little exercise however.  Did stop the actos due to ads on TV saying "bad things".   Does have sense of ongoing fatigue, but denies signficant hypersomnolence.  Has hx of anemia, no overt bleeding or bruising. Past Medical History  Diagnosis Date  . DIABETES MELLITUS, TYPE II 04/22/2007  . HYPERLIPIDEMIA 04/22/2007  . GOUT 04/22/2007  . ANEMIA-IRON DEFICIENCY 04/22/2007  . ANXIETY 04/22/2007  . OBSTRUCTIVE SLEEP APNEA 04/22/2007  . HYPERTENSION 04/22/2007  . GERD 04/22/2007  . RENAL INSUFFICIENCY 04/22/2007  . 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 FOOT PAIN, LEFT 12/23/2007  . OSTEOPENIA 02/12/2009  . INTERMITTENT VERTIGO 04/20/2009  . HYPERSOMNIA 01/11/2009  . COLONIC POLYPS, HX OF 04/22/2007  . NEPHRECTOMY, HX OF 04/22/2007   Past Surgical History  Procedure Date  . S/p right nephrectomy   . S/p parathyroid surgury     reports that she has never smoked. She does not have any smokeless tobacco history on file. Her alcohol and drug histories not on file. family history includes Heart disease in her mother. Allergies  Allergen Reactions  . Indomethacin     REACTION: renal insuff   Current Outpatient Prescriptions on File Prior to Visit  Medication Sig Dispense Refill  . amLODipine (NORVASC) 10 MG tablet Take 10 mg by mouth daily.        . ATIVAN 1 MG tablet TAKE 1 TABLET TWICE DAILYAS  NEEDED FOR NERVES.  60 each  2  . benazepril (LOTENSIN) 40 MG tablet Take 40 mg by mouth 2 (two) times daily.        . cloNIDine (CATAPRES) 0.1 MG tablet TAKE 1 TABLET TWICE      DAILY.  30 tablet  5  . Ferrous Sulfate (IRON) 325 (65 FE) MG TABS Take by mouth daily.        . furosemide (LASIX) 40 MG tablet Take 40 mg by mouth daily.        Marland Kitchen glucose blood (EASY CHECK GLUCOSE TEST) test strip 1 each by Other route 2 (two) times daily. Use as instructed       . GLUCOTROL XL 10 MG 24 hr tablet TAKE (2) TABLETS DAILY.  60 each  0  . isosorbide mononitrate (IMDUR) 60 MG 24 hr tablet Take 60 mg by mouth at bedtime.        Marland Kitchen labetalol (NORMODYNE) 200 MG tablet Take 200 mg by mouth. 2 by mouth two times daily       . simvastatin (ZOCOR) 20 MG tablet Take 20 mg by mouth daily.        Marland Kitchen DISCONTD: meclizine (ANTIVERT) 25 MG tablet Take 25 mg by mouth. 1 by mouth every 6 hours as needed for dizziness       . DISCONTD: minoxidil (LONITEN) 2.5 MG tablet Take 2.5 mg by mouth. 1 1/2 mg every day       . DISCONTD: pioglitazone (ACTOS)  45 MG tablet Take 45 mg by mouth daily.        Marland Kitchen DISCONTD: potassium chloride SA (KLOR-CON M20) 20 MEQ tablet Take 20 mEq by mouth daily.         No current facility-administered medications on file prior to visit.   Review of Systems All otherwise neg per pt     Objective:   Physical Exam BP 154/80  Pulse 71  Temp(Src) 97.8 F (36.6 C) (Oral)  Ht 5\' 5"  (1.651 m)  Wt 295 lb 4 oz (133.925 kg)  BMI 49.13 kg/m2  SpO2 95% Physical Exam  VS noted Constitutional: Pt appears well-developed and well-nourished.  HENT: Head: Normocephalic.  Right Ear: External ear normal.  Left Ear: External ear normal.  Eyes: Conjunctivae and EOM are normal. Pupils are equal, round, and reactive to light.  Neck: Normal range of motion. Neck supple.  Cardiovascular: Normal rate and regular rhythm.   Pulmonary/Chest: Effort normal and breath sounds normal.  Abd:  Soft, NT, non-distended, +  BS Neurological: Pt is alert. No cranial nerve deficit.  Skin: Skin is warm. No erythema.  Psychiatric: Pt behavior is normal. Thought content normal.         Assessment & Plan:

## 2010-11-05 NOTE — Assessment & Plan Note (Signed)
Etiology unclear, Exam otherwise benign, to check labs as documented, follow with expectant management  

## 2010-11-05 NOTE — Patient Instructions (Signed)
You had the tetanus shot today Continue all other medications as before Please go to LAB in the Basement for the blood and/or urine tests to be done today Please call the phone number [phone] (the PhoneTree System) for results of testing in 2-3 days;  When calling, simply dial the number, and when prompted enter the MRN number above (the Medical Record Number) and the # key, then the message should start. Please keep your appointments with your specialists as you have planned; the kidney doctor, as well as the GI doctor to re-do the colonoscopy (as you are due) Please return in 6 months, or sooner if needed

## 2010-11-05 NOTE — Assessment & Plan Note (Signed)
stable overall by hx and exam, most recent data reviewed with pt, and pt to continue medical treatment as before  Lab Results  Component Value Date   HGB 9.2* 06/03/2010

## 2010-11-05 NOTE — Assessment & Plan Note (Signed)
Mild recent symptoms, toes cleared up now;  stable overall by hx and exam, most recent data reviewed with pt, and pt to continue medical treatment as before  To check uric acid

## 2010-11-06 LAB — LDL CHOLESTEROL, DIRECT: Direct LDL: 109 mg/dL

## 2010-11-07 ENCOUNTER — Other Ambulatory Visit: Payer: Self-pay | Admitting: Internal Medicine

## 2010-11-07 MED ORDER — SITAGLIPTIN PHOS-METFORMIN HCL 50-1000 MG PO TABS: 1.0000 | ORAL_TABLET | Freq: Every day | ORAL | Status: DC

## 2010-11-07 MED ORDER — ATORVASTATIN CALCIUM 20 MG PO TABS: 20.0000 mg | ORAL_TABLET | Freq: Every day | ORAL | Status: DC

## 2010-11-08 ENCOUNTER — Telehealth: Payer: Self-pay

## 2010-11-08 NOTE — Telephone Encounter (Signed)
Pharmacy received Lipitor rx. Please advise as pt. Had been taking zocor from Dr. Leretha Dykes, is Lipitor to replace Zocor?

## 2010-11-08 NOTE — Telephone Encounter (Signed)
Yes, ok to stop the zocor as it is not working well

## 2010-11-08 NOTE — Telephone Encounter (Signed)
Pharmacy informed of MD's instructions 

## 2010-11-26 ENCOUNTER — Other Ambulatory Visit: Payer: Self-pay | Admitting: Internal Medicine

## 2010-12-19 ENCOUNTER — Ambulatory Visit: Payer: Medicare Other | Admitting: Internal Medicine

## 2010-12-19 DIAGNOSIS — Z0289 Encounter for other administrative examinations: Secondary | ICD-10-CM

## 2011-01-19 ENCOUNTER — Other Ambulatory Visit: Payer: Self-pay | Admitting: Internal Medicine

## 2011-01-20 NOTE — Telephone Encounter (Signed)
Faxed hardcopy to [facility] [address] [place] [phone] 

## 2011-02-02 ENCOUNTER — Other Ambulatory Visit: Payer: Self-pay | Admitting: Internal Medicine

## 2011-03-04 LAB — GLUCOSE, CAPILLARY: Glucose-Capillary: 108 — ABNORMAL HIGH

## 2011-03-04 LAB — POCT I-STAT, CHEM 8
BUN: 42 — ABNORMAL HIGH
Calcium, Ion: 1.21
Chloride: 105
Glucose, Bld: 69 — ABNORMAL LOW
HCT: 29 — ABNORMAL LOW
Potassium: 3.2 — ABNORMAL LOW

## 2011-03-06 LAB — URINE CULTURE

## 2011-03-06 LAB — URINALYSIS, ROUTINE W REFLEX MICROSCOPIC
Ketones, ur: NEGATIVE mg/dL
Nitrite: NEGATIVE
Specific Gravity, Urine: 1.015 (ref 1.005–1.030)
pH: 5.5 (ref 5.0–8.0)

## 2011-03-06 LAB — URINE MICROSCOPIC-ADD ON

## 2011-05-08 ENCOUNTER — Ambulatory Visit: Payer: Medicare Other | Admitting: Internal Medicine

## 2011-05-08 DIAGNOSIS — Z0289 Encounter for other administrative examinations: Secondary | ICD-10-CM

## 2011-05-12 ENCOUNTER — Other Ambulatory Visit: Payer: Self-pay | Admitting: Internal Medicine

## 2011-05-12 NOTE — Telephone Encounter (Signed)
Done hardcopy to [facility]  

## 2011-05-13 NOTE — Telephone Encounter (Signed)
Faxed hardcopy to [facility] 

## 2011-10-02 ENCOUNTER — Other Ambulatory Visit: Payer: Self-pay

## 2011-10-02 ENCOUNTER — Other Ambulatory Visit: Payer: Self-pay | Admitting: Internal Medicine

## 2011-10-02 MED ORDER — LORAZEPAM 1 MG PO TABS: 1.0000 mg | ORAL_TABLET | Freq: Two times a day (BID) | ORAL | Status: DC | PRN

## 2011-10-02 NOTE — Telephone Encounter (Signed)
Faxed hardcopy to pharmacy. 

## 2011-10-02 NOTE — Telephone Encounter (Signed)
Done hardcopy to [facility]  

## 2011-11-21 ENCOUNTER — Other Ambulatory Visit: Payer: Self-pay

## 2011-11-21 MED ORDER — BENAZEPRIL HCL 40 MG PO TABS: 40.0000 mg | ORAL_TABLET | Freq: Two times a day (BID) | ORAL | Status: DC

## 2011-11-27 ENCOUNTER — Ambulatory Visit: Payer: Medicare Other | Admitting: Internal Medicine

## 2011-11-27 DIAGNOSIS — Z0289 Encounter for other administrative examinations: Secondary | ICD-10-CM

## 2011-12-01 DIAGNOSIS — S82401A Unspecified fracture of shaft of right fibula, initial encounter for closed fracture: Secondary | ICD-10-CM

## 2011-12-01 HISTORY — DX: Unspecified fracture of shaft of right fibula, initial encounter for closed fracture: S82.401A

## 2011-12-05 ENCOUNTER — Other Ambulatory Visit: Payer: Self-pay | Admitting: Internal Medicine

## 2011-12-24 ENCOUNTER — Encounter (HOSPITAL_COMMUNITY): Payer: Self-pay | Admitting: Emergency Medicine

## 2011-12-24 ENCOUNTER — Inpatient Hospital Stay (HOSPITAL_COMMUNITY)
Admission: EM | Admit: 2011-12-24 | Discharge: 2011-12-29 | DRG: 563 | Disposition: A | Discharge status: Skilled Nursing Facility | Payer: Medicare HMO | Attending: Internal Medicine | Admitting: Internal Medicine

## 2011-12-24 ENCOUNTER — Emergency Department (HOSPITAL_COMMUNITY): Payer: Medicare HMO

## 2011-12-24 DIAGNOSIS — Y92009 Unspecified place in unspecified non-institutional (private) residence as the place of occurrence of the external cause: Secondary | ICD-10-CM

## 2011-12-24 DIAGNOSIS — Z6841 Body Mass Index (BMI) 40.0 and over, adult: Secondary | ICD-10-CM

## 2011-12-24 DIAGNOSIS — I10 Essential (primary) hypertension: Secondary | ICD-10-CM | POA: Diagnosis present

## 2011-12-24 DIAGNOSIS — F411 Generalized anxiety disorder: Secondary | ICD-10-CM

## 2011-12-24 DIAGNOSIS — N289 Disorder of kidney and ureter, unspecified: Secondary | ICD-10-CM | POA: Diagnosis present

## 2011-12-24 DIAGNOSIS — M899 Disorder of bone, unspecified: Secondary | ICD-10-CM | POA: Diagnosis present

## 2011-12-24 DIAGNOSIS — Z860100 Personal history of colon polyps, unspecified: Secondary | ICD-10-CM

## 2011-12-24 DIAGNOSIS — Z79899 Other long term (current) drug therapy: Secondary | ICD-10-CM

## 2011-12-24 DIAGNOSIS — IMO0001 Reserved for inherently not codable concepts without codable children: Secondary | ICD-10-CM | POA: Diagnosis present

## 2011-12-24 DIAGNOSIS — E119 Type 2 diabetes mellitus without complications: Secondary | ICD-10-CM | POA: Diagnosis present

## 2011-12-24 DIAGNOSIS — IMO0002 Reserved for concepts with insufficient information to code with codable children: Secondary | ICD-10-CM | POA: Diagnosis present

## 2011-12-24 DIAGNOSIS — K219 Gastro-esophageal reflux disease without esophagitis: Secondary | ICD-10-CM

## 2011-12-24 DIAGNOSIS — S62109A Fracture of unspecified carpal bone, unspecified wrist, initial encounter for closed fracture: Secondary | ICD-10-CM | POA: Diagnosis present

## 2011-12-24 DIAGNOSIS — R42 Dizziness and giddiness: Secondary | ICD-10-CM

## 2011-12-24 DIAGNOSIS — S82839A Other fracture of upper and lower end of unspecified fibula, initial encounter for closed fracture: Secondary | ICD-10-CM | POA: Diagnosis present

## 2011-12-24 DIAGNOSIS — M949 Disorder of cartilage, unspecified: Secondary | ICD-10-CM | POA: Diagnosis present

## 2011-12-24 DIAGNOSIS — S52599A Other fractures of lower end of unspecified radius, initial encounter for closed fracture: Principal | ICD-10-CM | POA: Diagnosis present

## 2011-12-24 DIAGNOSIS — N259 Disorder resulting from impaired renal tubular function, unspecified: Secondary | ICD-10-CM | POA: Diagnosis present

## 2011-12-24 DIAGNOSIS — M79609 Pain in unspecified limb: Secondary | ICD-10-CM

## 2011-12-24 DIAGNOSIS — R5383 Other fatigue: Secondary | ICD-10-CM

## 2011-12-24 DIAGNOSIS — W19XXXA Unspecified fall, initial encounter: Secondary | ICD-10-CM

## 2011-12-24 DIAGNOSIS — S82409A Unspecified fracture of shaft of unspecified fibula, initial encounter for closed fracture: Secondary | ICD-10-CM | POA: Diagnosis present

## 2011-12-24 DIAGNOSIS — G4733 Obstructive sleep apnea (adult) (pediatric): Secondary | ICD-10-CM

## 2011-12-24 DIAGNOSIS — W010XXA Fall on same level from slipping, tripping and stumbling without subsequent striking against object, initial encounter: Secondary | ICD-10-CM | POA: Diagnosis present

## 2011-12-24 DIAGNOSIS — N390 Urinary tract infection, site not specified: Secondary | ICD-10-CM | POA: Diagnosis present

## 2011-12-24 DIAGNOSIS — M1710 Unilateral primary osteoarthritis, unspecified knee: Secondary | ICD-10-CM

## 2011-12-24 DIAGNOSIS — E785 Hyperlipidemia, unspecified: Secondary | ICD-10-CM | POA: Diagnosis present

## 2011-12-24 DIAGNOSIS — M109 Gout, unspecified: Secondary | ICD-10-CM

## 2011-12-24 DIAGNOSIS — Z8249 Family history of ischemic heart disease and other diseases of the circulatory system: Secondary | ICD-10-CM

## 2011-12-24 DIAGNOSIS — D509 Iron deficiency anemia, unspecified: Secondary | ICD-10-CM | POA: Diagnosis present

## 2011-12-24 DIAGNOSIS — Z0000 Encounter for general adult medical examination without abnormal findings: Secondary | ICD-10-CM

## 2011-12-24 DIAGNOSIS — Z794 Long term (current) use of insulin: Secondary | ICD-10-CM

## 2011-12-24 DIAGNOSIS — Z905 Acquired absence of kidney: Secondary | ICD-10-CM

## 2011-12-24 LAB — CBC
HCT: 40.5 % (ref 36.0–46.0)
Hemoglobin: 13.3 g/dL (ref 12.0–15.0)
WBC: 13.8 10*3/uL — ABNORMAL HIGH (ref 4.0–10.5)

## 2011-12-24 LAB — COMPREHENSIVE METABOLIC PANEL
Alkaline Phosphatase: 93 U/L (ref 39–117)
BUN: 14 mg/dL (ref 6–23)
Chloride: 102 mEq/L (ref 96–112)
GFR calc Af Amer: >90 mL/min (ref >90)
Glucose, Bld: 372 mg/dL — ABNORMAL HIGH (ref 70–99)
Potassium: 3.7 mEq/L (ref 3.5–5.1)
Total Bilirubin: 0.5 mg/dL (ref 0.3–1.2)

## 2011-12-24 LAB — URINALYSIS, ROUTINE W REFLEX MICROSCOPIC
Ketones, ur: NEGATIVE mg/dL
Nitrite: NEGATIVE
Protein, ur: 100 mg/dL — AB
Urobilinogen, UA: 0.2 mg/dL (ref 0.0–1.0)
pH: 5.5 (ref 5.0–8.0)

## 2011-12-24 LAB — URINE MICROSCOPIC-ADD ON

## 2011-12-24 LAB — GLUCOSE, CAPILLARY
Glucose-Capillary: 351 mg/dL — ABNORMAL HIGH (ref 70–99)
Glucose-Capillary: 340 mg/dL — ABNORMAL HIGH (ref 70–99)

## 2011-12-24 LAB — DIFFERENTIAL
Basophils Relative: 0 % (ref 0–1)
Eosinophils Absolute: 0 10*3/uL (ref 0.0–0.7)
Neutrophils Relative %: 88 % — ABNORMAL HIGH (ref 43–77)

## 2011-12-24 MED ORDER — BIOTENE DRY MOUTH MT LIQD
15.0000 mL | Freq: Two times a day (BID) | OROMUCOSAL | Status: DC
  Administered 2011-12-24 – 2011-12-29 (×6): 15 mL via OROMUCOSAL

## 2011-12-24 MED ORDER — HYDROCODONE-ACETAMINOPHEN 5-325 MG PO TABS
1.0000 | ORAL_TABLET | ORAL | Status: DC | PRN
  Administered 2011-12-24 – 2011-12-25 (×3): 2 via ORAL
  Administered 2011-12-26: 1 via ORAL
  Administered 2011-12-27 – 2011-12-29 (×8): 2 via ORAL
  Filled 2011-12-24: qty 1
  Filled 2011-12-24 (×9): qty 2
  Filled 2011-12-24: qty 1
  Filled 2011-12-24 (×2): qty 2

## 2011-12-24 MED ORDER — AMLODIPINE BESYLATE 10 MG PO TABS
10.0000 mg | ORAL_TABLET | Freq: Every day | ORAL | Status: DC
  Administered 2011-12-24 – 2011-12-29 (×6): 10 mg via ORAL
  Filled 2011-12-24 (×6): qty 1

## 2011-12-24 MED ORDER — INSULIN ASPART 100 UNIT/ML ~~LOC~~ SOLN
0.0000 [IU] | Freq: Three times a day (TID) | SUBCUTANEOUS | Status: DC
  Administered 2011-12-24: 15 [IU] via SUBCUTANEOUS
  Administered 2011-12-25: 5 [IU] via SUBCUTANEOUS
  Administered 2011-12-25 (×2): 8 [IU] via SUBCUTANEOUS
  Administered 2011-12-26: 5 [IU] via SUBCUTANEOUS
  Administered 2011-12-26 – 2011-12-27 (×3): 8 [IU] via SUBCUTANEOUS
  Administered 2011-12-27: 5 [IU] via SUBCUTANEOUS
  Administered 2011-12-27 – 2011-12-28 (×2): 8 [IU] via SUBCUTANEOUS
  Administered 2011-12-28 – 2011-12-29 (×2): 3 [IU] via SUBCUTANEOUS
  Administered 2011-12-29: 5 [IU] via SUBCUTANEOUS

## 2011-12-24 MED ORDER — HEPARIN SODIUM (PORCINE) 5000 UNIT/ML IJ SOLN
5000.0000 [IU] | Freq: Three times a day (TID) | INTRAMUSCULAR | Status: DC
  Administered 2011-12-24 – 2011-12-29 (×16): 5000 [IU] via SUBCUTANEOUS
  Filled 2011-12-24 (×18): qty 1

## 2011-12-24 MED ORDER — CLONIDINE HCL 0.1 MG PO TABS
0.1000 mg | ORAL_TABLET | Freq: Two times a day (BID) | ORAL | Status: DC
  Administered 2011-12-24 – 2011-12-29 (×10): 0.1 mg via ORAL
  Filled 2011-12-24 (×11): qty 1

## 2011-12-24 MED ORDER — LIDOCAINE HCL 2 % IJ SOLN: 20.0000 mL | Freq: Once | INTRAMUSCULAR | Status: DC

## 2011-12-24 MED ORDER — LABETALOL HCL 200 MG PO TABS
200.0000 mg | ORAL_TABLET | Freq: Once | ORAL | Status: AC
  Administered 2011-12-24: 200 mg via ORAL
  Filled 2011-12-24: qty 1

## 2011-12-24 MED ORDER — ACETAMINOPHEN 650 MG RE SUPP: 650.0000 mg | Freq: Four times a day (QID) | RECTAL | Status: DC | PRN

## 2011-12-24 MED ORDER — DEXTROSE 5 % IV SOLN
1.0000 g | Freq: Once | INTRAVENOUS | Status: AC
  Administered 2011-12-24: 1 g via INTRAVENOUS
  Filled 2011-12-24: qty 10

## 2011-12-24 MED ORDER — GLIPIZIDE ER 5 MG PO TB24
5.0000 mg | ORAL_TABLET | Freq: Every day | ORAL | Status: DC
  Administered 2011-12-25 – 2011-12-26 (×2): 5 mg via ORAL
  Filled 2011-12-24 (×3): qty 1

## 2011-12-24 MED ORDER — ONDANSETRON HCL 4 MG/2ML IJ SOLN: 4.0000 mg | Freq: Four times a day (QID) | INTRAMUSCULAR | Status: DC | PRN

## 2011-12-24 MED ORDER — POLYETHYLENE GLYCOL 3350 17 G PO PACK
17.0000 g | PACK | Freq: Every day | ORAL | Status: DC | PRN
  Administered 2011-12-28: 17 g via ORAL
  Filled 2011-12-24 (×2): qty 1

## 2011-12-24 MED ORDER — ACETAMINOPHEN 325 MG PO TABS: 650.0000 mg | ORAL_TABLET | Freq: Four times a day (QID) | ORAL | Status: DC | PRN

## 2011-12-24 MED ORDER — SIMVASTATIN 20 MG PO TABS
20.0000 mg | ORAL_TABLET | Freq: Every evening | ORAL | Status: DC
  Administered 2011-12-24 – 2011-12-29 (×6): 20 mg via ORAL
  Filled 2011-12-24 (×6): qty 1

## 2011-12-24 MED ORDER — LORAZEPAM 1 MG PO TABS
1.0000 mg | ORAL_TABLET | Freq: Two times a day (BID) | ORAL | Status: DC | PRN
  Administered 2011-12-24: 1 mg via ORAL
  Filled 2011-12-24: qty 1

## 2011-12-24 MED ORDER — FENTANYL CITRATE 0.05 MG/ML IJ SOLN
50.0000 ug | Freq: Once | INTRAMUSCULAR | Status: AC
  Administered 2011-12-24: 50 ug via INTRAVENOUS
  Filled 2011-12-24: qty 2

## 2011-12-24 MED ORDER — BENAZEPRIL HCL 40 MG PO TABS
40.0000 mg | ORAL_TABLET | Freq: Two times a day (BID) | ORAL | Status: DC
  Administered 2011-12-24 – 2011-12-29 (×11): 40 mg via ORAL
  Filled 2011-12-24 (×12): qty 1

## 2011-12-24 MED ORDER — SODIUM CHLORIDE 0.9 % IV SOLN
INTRAVENOUS | Status: DC
  Administered 2011-12-24 – 2011-12-25 (×2): via INTRAVENOUS
  Administered 2011-12-27: 20 mL via INTRAVENOUS

## 2011-12-24 MED ORDER — ONDANSETRON HCL 4 MG PO TABS: 4.0000 mg | ORAL_TABLET | Freq: Four times a day (QID) | ORAL | Status: DC | PRN

## 2011-12-24 NOTE — Progress Notes (Signed)
Orthopedic Tech Progress Note Patient Details:  Katie Dawson 05-14-1934 [id number]  Ortho Devices Type of Ortho Device: Arm foam sling;Sugartong splint Ortho Device/Splint Location: finger traps Ortho Device/Splint Interventions: Application   Cammer, Mickie Bail 12/24/2011, 11:31 AM

## 2011-12-24 NOTE — ED Notes (Signed)
Patient transferred to 5511.

## 2011-12-24 NOTE — ED Provider Notes (Addendum)
Medical screening examination/treatment/procedure(s) were conducted as a shared visit with non-physician practitioner(s) and myself.  I personally evaluated the patient during the encounter     Pt slipped and fell in the bathroom while going to urinate. States she hit her head, complaining of pain in R wrist and R leg. Rolled from LSB. Sent for imaging.   Cherryl Babin B. Bernette Mayers, MD 12/24/11 0950  11:20 AM Discussed imaging with Dr. Mina Marble who is at the bedside to reduce and splint the wrist. Will then need to address mobility as the patient also has a proximal fibula fracture. She has no tenderness to medial maleolus. Doubt Maisonneuve fx.   Jazmin Vensel B. Bernette Mayers, MD 12/24/11 1122

## 2011-12-24 NOTE — Progress Notes (Signed)
Katie Dawson [id number] Admission Data: 12/24/2011 6:24 PM Attending Provider: Marinda Elk, MD  XBM:[id number] Jonny Ruiz, MD Consults/ Treatment Team:    Katie Dawson is a 78 y.o. female patient admitted from ED after a fall at home and is positive for a UTI awake, alert  & orientated  X 3,  No Order, VSS - Blood pressure 204/78, pulse 82, temperature 98.5 F (36.9 C), temperature source Oral, resp. rate 20, SpO2 96.00%., no c/o shortness of breath, no c/o chest pain, no distress noted.   IV site WDL:  hand left, condition patent, no redness and leaking with a transparent dsg. IV team called for in IV site  Allergies:   Allergies  Allergen Reactions  . Indomethacin     REACTION: renal insuff     Past Medical History  Diagnosis Date  . DIABETES MELLITUS, TYPE II 04/22/2007  . HYPERLIPIDEMIA 04/22/2007  . GOUT 04/22/2007  . ANEMIA-IRON DEFICIENCY 04/22/2007  . ANXIETY 04/22/2007  . OBSTRUCTIVE SLEEP APNEA 04/22/2007  . HYPERTENSION 04/22/2007  . GERD 04/22/2007  . RENAL INSUFFICIENCY 04/22/2007  . 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 FOOT PAIN, LEFT 12/23/2007  . OSTEOPENIA 02/12/2009  . INTERMITTENT VERTIGO 04/20/2009  . HYPERSOMNIA 01/11/2009  . COLONIC POLYPS, HX OF 04/22/2007  . NEPHRECTOMY, HX OF 04/22/2007    History:  obtained from the patient.   Pt orientation to unit, room and routine. Information packet given to patient/family and safety video watched.  Admission INP armband ID verified with patient/family, and in place. SR up x 2, fall risk assessment complete with Patient and family verbalizing understanding of risks associated with falls. Pt verbalizes an understanding of how to use the call bell and to call for help before getting out of bed.  Skin, clean-dry- intact without evidence of bruising, or skin tears.   Pt is obese and has yeast and redness under breast folds, abdominal folds and leg folds. Antifungal powder applied.Pt also has a cast on her  right arm wrapped in an ace bandage.     Will cont to monitor and assist as needed.  Cindra Eves, RN 12/24/2011 6:24 PM

## 2011-12-24 NOTE — ED Notes (Signed)
Patient transported to X-ray 

## 2011-12-24 NOTE — Consult Note (Signed)
Reason for Consult:right radius fracture Referring Physician: NANCE MCCOMBS is an 78 y.o. female.  HPI: s/p fall with displaced right distal radius fracture  Past Medical History  Diagnosis Date  . DIABETES MELLITUS, TYPE II 04/22/2007  . HYPERLIPIDEMIA 04/22/2007  . GOUT 04/22/2007  . ANEMIA-IRON DEFICIENCY 04/22/2007  . ANXIETY 04/22/2007  . OBSTRUCTIVE SLEEP APNEA 04/22/2007  . HYPERTENSION 04/22/2007  . GERD 04/22/2007  . RENAL INSUFFICIENCY 04/22/2007  . 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 FOOT PAIN, LEFT 12/23/2007  . OSTEOPENIA 02/12/2009  . INTERMITTENT VERTIGO 04/20/2009  . HYPERSOMNIA 01/11/2009  . COLONIC POLYPS, HX OF 04/22/2007  . NEPHRECTOMY, HX OF 04/22/2007    Past Surgical History  Procedure Date  . S/p right nephrectomy   . S/p parathyroid surgury     Family History  Problem Relation Age of Onset  . Heart disease Mother     Social History:  reports that she has never smoked. She does not have any smokeless tobacco history on file. She reports that she does not drink alcohol or use illicit drugs.  Allergies:  Allergies  Allergen Reactions  . Indomethacin     REACTION: renal insuff    Medications: I have reviewed the patient's current medications.  No results found for this or any previous visit (from the past 48 hour(s)).  Dg Wrist Complete Right  12/24/2011  *RADIOLOGY REPORT*  Clinical Data: Fall, wrist deformity.  RIGHT WRIST - COMPLETE 3+ VIEW  Comparison: None.  Findings: There is a comminuted fracture of the distal radius which extends to the joint space. A long segment oblique fracture extends more proximally toward the radial shaft.   Mild dorsal angulation. Moderate displacement of the distal radius and carpal bones away from the ulna.  Ulnar styloid intact.  No carpal bone fracture.  IMPRESSION: Distal radius fracture as described.  Original Report Authenticated By: Elsie Stain, M.D.   Ct Head Wo  Contrast  12/24/2011  *RADIOLOGY REPORT*  Clinical Data:  Fall.  Trauma to the head and neck.  CT HEAD WITHOUT CONTRAST CT CERVICAL SPINE WITHOUT CONTRAST  Technique:  Multidetector CT imaging of the head and cervical spine was performed following the standard protocol without intravenous contrast.  Multiplanar CT image reconstructions of the cervical spine were also generated.  Comparison:  03/30/2009  CT HEAD  Findings: The brain shows age related atrophy with chronic appearing small vessel changes affecting the hemispheric deep white matter.  No sign of acute infarction, mass lesion, hemorrhage, hydrocephalus or extra-axial collection.  No skull fracture.  No fluid in the sinuses.  There is right frontal scalp swelling. Atherosclerotic calcification effects major vessels at the base of the brain.  IMPRESSION: Age related atrophy and chronic small vessel change.  No acute intracranial finding.  Right frontal scalp swelling.  No skull fracture.  CT CERVICAL SPINE  Findings: No fracture or traumatic malalignment.  No soft tissue swelling.  There is ordinary degenerative arthritis at the C1-2 level.  There are shallow central disc protrusions at C2-3 and C3- 4.  C4-5 shows spondylosis with endplate osteophytes and shallow protrusion of disc material.  The C5-6 shows pronounced chronic spondylosis with endplate osteophytes more prominent on the right than the left.  This narrows the right side of the canal and both neural foramina right more than left.  IMPRESSION: No acute or traumatic finding.  Degenerative changes as above with spinal stenosis because of osteophytes at the C5-6 level worse on the right.  Original Report Authenticated By: Thomasenia Sales, M.D.   Ct Cervical Spine Wo Contrast  12/24/2011  *RADIOLOGY REPORT*  Clinical Data:  Fall.  Trauma to the head and neck.  CT HEAD WITHOUT CONTRAST CT CERVICAL SPINE WITHOUT CONTRAST  Technique:  Multidetector CT imaging of the head and cervical spine was  performed following the standard protocol without intravenous contrast.  Multiplanar CT image reconstructions of the cervical spine were also generated.  Comparison:  03/30/2009  CT HEAD  Findings: The brain shows age related atrophy with chronic appearing small vessel changes affecting the hemispheric deep white matter.  No sign of acute infarction, mass lesion, hemorrhage, hydrocephalus or extra-axial collection.  No skull fracture.  No fluid in the sinuses.  There is right frontal scalp swelling. Atherosclerotic calcification effects major vessels at the base of the brain.  IMPRESSION: Age related atrophy and chronic small vessel change.  No acute intracranial finding.  Right frontal scalp swelling.  No skull fracture.  CT CERVICAL SPINE  Findings: No fracture or traumatic malalignment.  No soft tissue swelling.  There is ordinary degenerative arthritis at the C1-2 level.  There are shallow central disc protrusions at C2-3 and C3- 4.  C4-5 shows spondylosis with endplate osteophytes and shallow protrusion of disc material.  The C5-6 shows pronounced chronic spondylosis with endplate osteophytes more prominent on the right than the left.  This narrows the right side of the canal and both neural foramina right more than left.  IMPRESSION: No acute or traumatic finding.  Degenerative changes as above with spinal stenosis because of osteophytes at the C5-6 level worse on the right.  Original Report Authenticated By: Thomasenia Sales, M.D.   Dg Knee Complete 4 Views Right  12/24/2011  *RADIOLOGY REPORT*  Clinical Data: Fall.  Swelling.  RIGHT KNEE - COMPLETE 4+ VIEW  Comparison: None.  Findings: Fibular head fracture.  Sclerotic appearance of the right lateral femoral condyle may represent result of osteonecrosis or bony infarct.  There is slight incongruity of the articular surface which may be related to superimposed degenerative changes although difficult to exclude subtle impaction fracture of the right lateral  femoral condyle.  Tricompartment degenerative changes.  Small suprapatellar joint effusion.  IMPRESSION: Fibular head fracture.  Sclerotic appearance of the right lateral femoral condyle may represent result of osteonecrosis or bony infarct.  There is slight incongruity of the articular surface which may be related to superimposed degenerative changes although difficult to exclude subtle impaction fracture of the right lateral femoral condyle.  Tricompartment degenerative changes.  Small suprapatellar joint effusion.  Original Report Authenticated By: Fuller Canada, M.D.    Review of Systems  All other systems reviewed and are negative.   Blood pressure 169/76, pulse 71, temperature 98.5 F (36.9 C), temperature source Oral, resp. rate 16, SpO2 96.00%. Physical Exam  Constitutional: She is oriented to person, place, and time. She appears well-developed and well-nourished.  HENT:  Head: Normocephalic.  Cardiovascular: Normal rate.   Respiratory: Effort normal.  Musculoskeletal:       Right wrist: She exhibits tenderness, bony tenderness, swelling and deformity.  Neurological: She is alert and oriented to person, place, and time.  Skin: Skin is warm.  Psychiatric: She has a normal mood and affect. Her speech is normal and behavior is normal. Thought content normal.    Assessment/Plan: As above  Patient given 2% lidocaine hematoma block at bedside and then gentle closed reduction and splinting   Will see in my office this  Tuesday to discuss further treatment options   Pain control as per ER staff  Malissia Rabbani A 12/24/2011, 11:28 AM

## 2011-12-24 NOTE — ED Provider Notes (Signed)
History     CSN: [id number]  Arrival date & time 12/24/11  4098   First MD Initiated Contact with Patient 12/24/11 [phone]      Chief Complaint  Patient presents with  . Fall    (Consider location/radiation/quality/duration/timing/severity/associated sxs/prior treatment) HPI Comments: 78 y/o female brought to ED by EMS s/p fall this morning. States she got out of bed to go to the bathroom and could not hold it, began urinating on herself and slipped on her urine causing her to hit her forehead and mouth on the counter and landed on her right wrist. Fall was not witnessed. She screamed for help and her grandson called EMS. Denies any LOC, dizziness, or visual disturbance. She usually does not have an incontinence issue. Denies being on any blood thinners. Rates wrist pain 10/10. Patient has dentures which were not in place at the time of fall. She has 2 teeth of her own on the bottom which are still intact. Admits to right knee pain.  Patient is a 78 y.o. female presenting with fall. The history is provided by the patient.  Fall Pertinent negatives include no abdominal pain.    Past Medical History  Diagnosis Date  . DIABETES MELLITUS, TYPE II 04/22/2007  . HYPERLIPIDEMIA 04/22/2007  . GOUT 04/22/2007  . ANEMIA-IRON DEFICIENCY 04/22/2007  . ANXIETY 04/22/2007  . OBSTRUCTIVE SLEEP APNEA 04/22/2007  . HYPERTENSION 04/22/2007  . GERD 04/22/2007  . RENAL INSUFFICIENCY 04/22/2007  . 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 FOOT PAIN, LEFT 12/23/2007  . OSTEOPENIA 02/12/2009  . INTERMITTENT VERTIGO 04/20/2009  . HYPERSOMNIA 01/11/2009  . COLONIC POLYPS, HX OF 04/22/2007  . NEPHRECTOMY, HX OF 04/22/2007    Past Surgical History  Procedure Date  . S/p right nephrectomy   . S/p parathyroid surgury     Family History  Problem Relation Age of Onset  . Heart disease Mother     History  Substance Use Topics  . Smoking status: Never Smoker   . Smokeless tobacco: Not on file    . Alcohol Use: No    OB History    Grav Para Term Preterm Abortions TAB SAB Ect Mult Living                  Review of Systems  HENT: Positive for neck pain. Negative for nosebleeds.   Respiratory: Negative for shortness of breath.   Cardiovascular: Negative for chest pain.  Gastrointestinal: Negative for abdominal pain.  Genitourinary: Positive for urgency.       Incontinence  Musculoskeletal:       Forehead injury, right wrist and right knee pain  Skin: Positive for wound.  Neurological: Negative for dizziness and syncope.       No LOC  Psychiatric/Behavioral: Negative for confusion.    Allergies  Indomethacin  Home Medications   Current Outpatient Rx  Name Route Sig Dispense Refill  . AMLODIPINE BESYLATE 10 MG PO TABS Oral Take 10 mg by mouth daily.      . ATORVASTATIN CALCIUM 20 MG PO TABS Oral Take 1 tablet (20 mg total) by mouth daily. 90 tablet 3  . BENAZEPRIL HCL 40 MG PO TABS Oral Take 1 tablet (40 mg total) by mouth 2 (two) times daily. 180 tablet 1  . CLONIDINE HCL 0.1 MG PO TABS  TAKE 1 TABLET TWICE DAILY. 60 tablet 9  . IRON 325 (65 FE) MG PO TABS Oral Take by mouth daily.      . FUROSEMIDE 40 MG PO  TABS Oral Take 40 mg by mouth daily.      Marland Kitchen GLUCOSE BLOOD VI STRP Other 1 each by Other route 2 (two) times daily. Use as instructed     . GLUCOTROL XL 10 MG PO TB24  TAKE (2) TABLETS DAILY. 180 each 0  . ISOSORBIDE MONONITRATE 60 MG PO TB24 Oral Take 60 mg by mouth at bedtime.      Marland Kitchen LABETALOL HCL 200 MG PO TABS  TAKE 1 TABLET TWICE DAILY. 60 tablet 0  . LORAZEPAM 1 MG PO TABS Oral Take 1 tablet (1 mg total) by mouth 2 (two) times daily as needed for anxiety. 60 tablet 2  . SITAGLIPTIN-METFORMIN HCL 50-1000 MG PO TABS Oral Take 1 tablet by mouth daily with breakfast. 90 tablet 3    BP 169/76  Pulse 71  Temp 98.5 F (36.9 C) (Oral)  Resp 16  SpO2 96%  Physical Exam  Constitutional: She is oriented to person, place, and time. She is cooperative. She  appears distressed. Cervical collar in place.  HENT:       Square 1 in diameter mass on forehead. Multiple abrasions present near lips and on face. No lacerations. Her two permanent teeth intact.  Eyes: Conjunctivae and EOM are normal. Pupils are equal, round, and reactive to light.  Neck: No spinous process tenderness present.  Cardiovascular: Normal rate and regular rhythm.   Pulmonary/Chest: Effort normal and breath sounds normal.  Abdominal: Soft. Bowel sounds are normal. There is no tenderness.  Musculoskeletal:       Right wrist: She exhibits tenderness and deformity.       Right knee: tenderness found. Medial joint line tenderness noted.  Neurological: She is alert and oriented to person, place, and time. No cranial nerve deficit or sensory deficit.  Skin: Abrasion (face) noted.  Psychiatric: Her behavior is normal. Her mood appears anxious.    ED Course  Procedures (including critical care time)  Labs Reviewed  URINALYSIS, ROUTINE W REFLEX MICROSCOPIC - Abnormal; Notable for the following:    APPearance HAZY (*)     Glucose, UA >1000 (*)     Hgb urine dipstick SMALL (*)     Protein, ur 100 (*)     Leukocytes, UA SMALL (*)     All other components within normal limits  URINE MICROSCOPIC-ADD ON - Abnormal; Notable for the following:    Squamous Epithelial / LPF FEW (*)     Bacteria, UA FEW (*)     All other components within normal limits   Dg Wrist Complete Right  12/24/2011  *RADIOLOGY REPORT*  Clinical Data: Fall, wrist deformity.  RIGHT WRIST - COMPLETE 3+ VIEW  Comparison: None.  Findings: There is a comminuted fracture of the distal radius which extends to the joint space. A long segment oblique fracture extends more proximally toward the radial shaft.   Mild dorsal angulation. Moderate displacement of the distal radius and carpal bones away from the ulna.  Ulnar styloid intact.  No carpal bone fracture.  IMPRESSION: Distal radius fracture as described.  Original Report  Authenticated By: Elsie Stain, M.D.   Ct Head Wo Contrast  12/24/2011  *RADIOLOGY REPORT*  Clinical Data:  Fall.  Trauma to the head and neck.  CT HEAD WITHOUT CONTRAST CT CERVICAL SPINE WITHOUT CONTRAST  Technique:  Multidetector CT imaging of the head and cervical spine was performed following the standard protocol without intravenous contrast.  Multiplanar CT image reconstructions of the cervical spine were also generated.  Comparison:  03/30/2009  CT HEAD  Findings: The brain shows age related atrophy with chronic appearing small vessel changes affecting the hemispheric deep white matter.  No sign of acute infarction, mass lesion, hemorrhage, hydrocephalus or extra-axial collection.  No skull fracture.  No fluid in the sinuses.  There is right frontal scalp swelling. Atherosclerotic calcification effects major vessels at the base of the brain.  IMPRESSION: Age related atrophy and chronic small vessel change.  No acute intracranial finding.  Right frontal scalp swelling.  No skull fracture.  CT CERVICAL SPINE  Findings: No fracture or traumatic malalignment.  No soft tissue swelling.  There is ordinary degenerative arthritis at the C1-2 level.  There are shallow central disc protrusions at C2-3 and C3- 4.  C4-5 shows spondylosis with endplate osteophytes and shallow protrusion of disc material.  The C5-6 shows pronounced chronic spondylosis with endplate osteophytes more prominent on the right than the left.  This narrows the right side of the canal and both neural foramina right more than left.  IMPRESSION: No acute or traumatic finding.  Degenerative changes as above with spinal stenosis because of osteophytes at the C5-6 level worse on the right.  Original Report Authenticated By: Thomasenia Sales, M.D.   Ct Cervical Spine Wo Contrast  12/24/2011  *RADIOLOGY REPORT*  Clinical Data:  Fall.  Trauma to the head and neck.  CT HEAD WITHOUT CONTRAST CT CERVICAL SPINE WITHOUT CONTRAST  Technique:   Multidetector CT imaging of the head and cervical spine was performed following the standard protocol without intravenous contrast.  Multiplanar CT image reconstructions of the cervical spine were also generated.  Comparison:  03/30/2009  CT HEAD  Findings: The brain shows age related atrophy with chronic appearing small vessel changes affecting the hemispheric deep white matter.  No sign of acute infarction, mass lesion, hemorrhage, hydrocephalus or extra-axial collection.  No skull fracture.  No fluid in the sinuses.  There is right frontal scalp swelling. Atherosclerotic calcification effects major vessels at the base of the brain.  IMPRESSION: Age related atrophy and chronic small vessel change.  No acute intracranial finding.  Right frontal scalp swelling.  No skull fracture.  CT CERVICAL SPINE  Findings: No fracture or traumatic malalignment.  No soft tissue swelling.  There is ordinary degenerative arthritis at the C1-2 level.  There are shallow central disc protrusions at C2-3 and C3- 4.  C4-5 shows spondylosis with endplate osteophytes and shallow protrusion of disc material.  The C5-6 shows pronounced chronic spondylosis with endplate osteophytes more prominent on the right than the left.  This narrows the right side of the canal and both neural foramina right more than left.  IMPRESSION: No acute or traumatic finding.  Degenerative changes as above with spinal stenosis because of osteophytes at the C5-6 level worse on the right.  Original Report Authenticated By: Thomasenia Sales, M.D.   Dg Knee Complete 4 Views Right  12/24/2011  *RADIOLOGY REPORT*  Clinical Data: Fall.  Swelling.  RIGHT KNEE - COMPLETE 4+ VIEW  Comparison: None.  Findings: Fibular head fracture.  Sclerotic appearance of the right lateral femoral condyle may represent result of osteonecrosis or bony infarct.  There is slight incongruity of the articular surface which may be related to superimposed degenerative changes although difficult  to exclude subtle impaction fracture of the right lateral femoral condyle.  Tricompartment degenerative changes.  Small suprapatellar joint effusion.  IMPRESSION: Fibular head fracture.  Sclerotic appearance of the right lateral femoral condyle may represent  result of osteonecrosis or bony infarct.  There is slight incongruity of the articular surface which may be related to superimposed degenerative changes although difficult to exclude subtle impaction fracture of the right lateral femoral condyle.  Tricompartment degenerative changes.  Small suprapatellar joint effusion.  Original Report Authenticated By: Fuller Canada, M.D.     No diagnosis found.    MDM  Patient being admitted. Multiple injuries and presence of UTI. Patient care overseen by Dr. Bernette Mayers.        Trevor Mace, PA-C 12/24/11 1235

## 2011-12-24 NOTE — ED Notes (Signed)
Report called to [facility] on 5500. Patient is unchanged from baseline. Right arm in cast. No c/o pain.  Alert, oriented x4. Granddaughter went home for awhile- will be back later. IV site is clean, dry, intact and covered with Tegaderm.

## 2011-12-24 NOTE — H&P (Signed)
[facility] History and Physical  Katie Dawson ZOX:[id number] DOB: 1933-08-09 DOA: 12/24/2011   PCP: Oliver Barre, MD   Chief Complaint: Fall  HPI:  78 year old female with past medical history of hypertension, diabetes mellitus status post nephrectomy appropriate answers liver lesion and no chemotherapy that comes in for fall. She relates she has been having dysuria more frequently than usual, she woke up this morning try to go to the bathroom but became incontinent and slipped and fell in her legs, she also hit her head. She relates she has been slightly dizzy upon standing, but she continued to take her blood pressure medication. She relates she feels like she is always thirsty. She does not lose consciousness during this time. He does relate that after she fell she started having aches in her wrist and her left leg. She relates no back pain.  Review of Systems:  Constitutional:  No weight loss, night sweats, Fevers, chills, fatigue.  HEENT:  No headaches, Difficulty swallowing,Tooth/dental problems,Sore throat,  No sneezing, itching, ear ache, nasal congestion, post nasal drip,  Cardio-vascular:  No chest pain, Orthopnea, PND, swelling in lower extremities, anasarca, dizziness, palpitations  GI:  No heartburn, indigestion, abdominal pain, nausea, vomiting, diarrhea, change in bowel habits, loss of appetite  Resp:  No shortness of breath with exertion or at rest. No excess mucus, no productive cough, No non-productive cough, No coughing up of blood.No change in color of mucus.No wheezing.No chest wall deformity  Skin:  no rash or lesions.   Musculoskeletal:  No joint pain or swelling. No decreased range of motion. No back pain.  Psych:  No change in mood or affect. No depression or anxiety. No memory loss.    Past Medical History  Diagnosis Date  . DIABETES MELLITUS, TYPE II 04/22/2007  . HYPERLIPIDEMIA 04/22/2007  . GOUT 04/22/2007  . ANEMIA-IRON DEFICIENCY 04/22/2007    . ANXIETY 04/22/2007  . OBSTRUCTIVE SLEEP APNEA 04/22/2007  . HYPERTENSION 04/22/2007  . GERD 04/22/2007  . RENAL INSUFFICIENCY 04/22/2007  . 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 FOOT PAIN, LEFT 12/23/2007  . OSTEOPENIA 02/12/2009  . INTERMITTENT VERTIGO 04/20/2009  . HYPERSOMNIA 01/11/2009  . COLONIC POLYPS, HX OF 04/22/2007  . NEPHRECTOMY, HX OF 04/22/2007   Past Surgical History  Procedure Date  . S/p right nephrectomy   . S/p parathyroid surgury    Social History:  reports that she has never smoked. She does not have any smokeless tobacco history on file. She reports that she does not drink alcohol or use illicit drugs.  Allergies  Allergen Reactions  . Indomethacin     REACTION: renal insuff    Family History  Problem Relation Age of Onset  . Heart disease Mother     Prior to Admission medications   Medication Sig Start Date End Date Taking? Authorizing Provider  amLODipine (NORVASC) 10 MG tablet Take 10 mg by mouth daily.     Yes Historical Provider, MD  benazepril (LOTENSIN) 40 MG tablet Take 1 tablet (40 mg total) by mouth 2 (two) times daily. 11/21/11  Yes Corwin Levins, MD  cloNIDine (CATAPRES) 0.1 MG tablet TAKE 1 TABLET TWICE DAILY. 02/02/11  Yes Corwin Levins, MD  Ferrous Sulfate (IRON) 325 (65 FE) MG TABS Take by mouth daily.     Yes Historical Provider, MD  GLUCOTROL XL 10 MG 24 hr tablet TAKE (2) TABLETS DAILY. 12/05/11  Yes Corwin Levins, MD  isosorbide mononitrate (IMDUR) 60 MG 24 hr tablet Take 60  mg by mouth at bedtime.     Yes Historical Provider, MD  labetalol (NORMODYNE) 200 MG tablet TAKE 1 TABLET TWICE DAILY. 12/05/11  Yes Corwin Levins, MD  LORazepam (ATIVAN) 1 MG tablet Take 1 tablet (1 mg total) by mouth 2 (two) times daily as needed for anxiety. 10/02/11  Yes Corwin Levins, MD  potassium chloride SA (K-DUR,KLOR-CON) 20 MEQ tablet Take 20 mEq by mouth daily.   Yes Historical Provider, MD  simvastatin (ZOCOR) 20 MG tablet Take 20 mg by mouth every  evening.   Yes Historical Provider, MD   Physical Exam: Filed Vitals:   12/24/11 0917 12/24/11 1338  BP: 169/76 180/88  Pulse: 71 73  Temp: 98.5 F (36.9 C) 98 F (36.7 C)  TempSrc: Oral Oral  Resp: 16 28  SpO2: 96% 98%   BP 180/88  Pulse 73  Temp 98 F (36.7 C) (Oral)  Resp 28  SpO2 98%  General Appearance:    Alert, cooperative, no distress, appears stated age, morbidly obese there is a bad smell when you enter   Head:    Normocephalic, without obvious abnormality, atraumatic  Eyes:    PERRL, conjunctiva/corneas clear, EOM's intact, fundi    benign, both eyes  Ears:    Normal TM's and external ear canals, both ears  Nose:   Nares normal, septum midline, mucosa normal, no drainage    or sinus tenderness  Throat:   Lips, mucosa, and tongue normal; teeth and gums normal  Neck:   Supple, symmetrical, trachea midline, no adenopathy;    thyroid:  no enlargement/tenderness/nodules; no carotid   bruit or JVD  Back:     Symmetric, no curvature, ROM normal, no CVA tenderness  Lungs:     Clear to auscultation bilaterally, respirations unlabored  Chest Wall:    No tenderness or deformity   Heart:    Regular rate and rhythm, S1 and S2 normal, no murmur, rub   or gallop     Abdomen:     Soft, non-tender, bowel sounds active all four quadrants,    no masses, no organomegaly, mild suprapubic tenderness         Extremities:   Extremities normal, atraumatic, no cyanosis or edema her left upper extremity is in a cast. The left lower extremity is painful to palpation she is able to mobilize joint.   Pulses:   2+ and symmetric all extremities  Skin:   Skin color, texture, turgor normal, no rashes or lesions  Lymph nodes:   Cervical, supraclavicular, and axillary nodes normal  Neurologic:   CNII-XII intact, normal strength, sensation and reflexes    throughout    Labs on Admission:  Basic Metabolic Panel: No results found for this basename:  NA:5,K:2,CL:5,CO2:5,GLUCOSE:5,BUN:5,CREATININE:5,CALCIUM:5,MG:5,PHOS:5 in the last 168 hours Liver Function Tests: No results found for this basename: AST:5,ALT:5,ALKPHOS:5,BILITOT:5,PROT:5,ALBUMIN:5 in the last 168 hours No results found for this basename: LIPASE:5,AMYLASE:5 in the last 168 hours No results found for this basename: AMMONIA:5 in the last 168 hours CBC: No results found for this basename: WBC:5,NEUTROABS:5,HGB:5,HCT:5,MCV:5,PLT:5 in the last 168 hours Cardiac Enzymes: No results found for this basename: CKTOTAL:5,CKMB:5,CKMBINDEX:5,TROPONINI:5 in the last 168 hours BNP: No components found with this basename: POCBNP:5 CBG: No results found for this basename: GLUCAP:5 in the last 168 hours  Radiological Exams on Admission: Dg Wrist Complete Right  12/24/2011  *RADIOLOGY REPORT*  Clinical Data: Fall, wrist deformity.  RIGHT WRIST - COMPLETE 3+ VIEW  Comparison: None.  Findings: There is a comminuted  fracture of the distal radius which extends to the joint space. A long segment oblique fracture extends more proximally toward the radial shaft.   Mild dorsal angulation. Moderate displacement of the distal radius and carpal bones away from the ulna.  Ulnar styloid intact.  No carpal bone fracture.  IMPRESSION: Distal radius fracture as described.  Original Report Authenticated By: Elsie Stain, M.D.   Ct Head Wo Contrast  12/24/2011  *RADIOLOGY REPORT*  Clinical Data:  Fall.  Trauma to the head and neck.  CT HEAD WITHOUT CONTRAST CT CERVICAL SPINE WITHOUT CONTRAST  Technique:  Multidetector CT imaging of the head and cervical spine was performed following the standard protocol without intravenous contrast.  Multiplanar CT image reconstructions of the cervical spine were also generated.  Comparison:  03/30/2009  CT HEAD  Findings: The brain shows age related atrophy with chronic appearing small vessel changes affecting the hemispheric deep white matter.  No sign of acute infarction,  mass lesion, hemorrhage, hydrocephalus or extra-axial collection.  No skull fracture.  No fluid in the sinuses.  There is right frontal scalp swelling. Atherosclerotic calcification effects major vessels at the base of the brain.  IMPRESSION: Age related atrophy and chronic small vessel change.  No acute intracranial finding.  Right frontal scalp swelling.  No skull fracture.  CT CERVICAL SPINE  Findings: No fracture or traumatic malalignment.  No soft tissue swelling.  There is ordinary degenerative arthritis at the C1-2 level.  There are shallow central disc protrusions at C2-3 and C3- 4.  C4-5 shows spondylosis with endplate osteophytes and shallow protrusion of disc material.  The C5-6 shows pronounced chronic spondylosis with endplate osteophytes more prominent on the right than the left.  This narrows the right side of the canal and both neural foramina right more than left.  IMPRESSION: No acute or traumatic finding.  Degenerative changes as above with spinal stenosis because of osteophytes at the C5-6 level worse on the right.  Original Report Authenticated By: Thomasenia Sales, M.D.   Ct Cervical Spine Wo Contrast  12/24/2011  *RADIOLOGY REPORT*  Clinical Data:  Fall.  Trauma to the head and neck.  CT HEAD WITHOUT CONTRAST CT CERVICAL SPINE WITHOUT CONTRAST  Technique:  Multidetector CT imaging of the head and cervical spine was performed following the standard protocol without intravenous contrast.  Multiplanar CT image reconstructions of the cervical spine were also generated.  Comparison:  03/30/2009  CT HEAD  Findings: The brain shows age related atrophy with chronic appearing small vessel changes affecting the hemispheric deep white matter.  No sign of acute infarction, mass lesion, hemorrhage, hydrocephalus or extra-axial collection.  No skull fracture.  No fluid in the sinuses.  There is right frontal scalp swelling. Atherosclerotic calcification effects major vessels at the base of the brain.   IMPRESSION: Age related atrophy and chronic small vessel change.  No acute intracranial finding.  Right frontal scalp swelling.  No skull fracture.  CT CERVICAL SPINE  Findings: No fracture or traumatic malalignment.  No soft tissue swelling.  There is ordinary degenerative arthritis at the C1-2 level.  There are shallow central disc protrusions at C2-3 and C3- 4.  C4-5 shows spondylosis with endplate osteophytes and shallow protrusion of disc material.  The C5-6 shows pronounced chronic spondylosis with endplate osteophytes more prominent on the right than the left.  This narrows the right side of the canal and both neural foramina right more than left.  IMPRESSION: No acute or traumatic finding.  Degenerative changes as above with spinal stenosis because of osteophytes at the C5-6 level worse on the right.  Original Report Authenticated By: Thomasenia Sales, M.D.   Dg Knee Complete 4 Views Right  12/24/2011  *RADIOLOGY REPORT*  Clinical Data: Fall.  Swelling.  RIGHT KNEE - COMPLETE 4+ VIEW  Comparison: None.  Findings: Fibular head fracture.  Sclerotic appearance of the right lateral femoral condyle may represent result of osteonecrosis or bony infarct.  There is slight incongruity of the articular surface which may be related to superimposed degenerative changes although difficult to exclude subtle impaction fracture of the right lateral femoral condyle.  Tricompartment degenerative changes.  Small suprapatellar joint effusion.  IMPRESSION: Fibular head fracture.  Sclerotic appearance of the right lateral femoral condyle may represent result of osteonecrosis or bony infarct.  There is slight incongruity of the articular surface which may be related to superimposed degenerative changes although difficult to exclude subtle impaction fracture of the right lateral femoral condyle.  Tricompartment degenerative changes.  Small suprapatellar joint effusion.  Original Report Authenticated By: Fuller Canada, M.D.     EKG: Independently reviewed. None  Assessment/Plan: Principal Problem: Fall most likely secondary to UTI (lower urinary tract infection) Admit her to MedSurg, we'll get urine culture, and continue her on Rocephin. She is status post nephrectomy secondary to cancerous lesion, so will need inpatient IV antibiotics. We'll start her on IV fluids get a basic metabolic panel and a CBC with differential. I will also go ahead and start her on IV fluids as she has been complaining of mild dizziness upon standing, and polyuria. Will stop her nitrates as this may be contributing to orthostatic hypotension.  Wrist fracture: Hand surgeon Dr. Alanda Amass has evaluated her he did a closed reduction in the emergency room and will like to followup with her as an outpatient to discuss further management.  Fibular head closed fracture: I'm going to call orthopedics to further evaluate her. Also get physical therapy to see her. I've already discussed with her the options about home health versus skilled nursing facility she seems to understand.  DIABETES MELLITUS, TYPE II: Continue glipizide check a hemoglobin A1c, start on sliding scale insulin.   History of ANEMIA-IRON DEFICIENCY: Check CBC we'll continue her sulfate.   HYPERTENSION Her blood pressure is mildly elevated. This could be secondary to pain. We'll continue all her other blood pressure medications except for him to her and she is complaining of orthostatic hypotension. We'll start her on IV fluids and continue to monitor her blood pressure very closely. We will pressure would be hard to control he would be noted that she would need a diuretic and she's currently not on one.  RENAL INSUFFICIENCY Baseline creatinine 1.0 from 11/05/2010. She's currently only has one kidney. There is no basic metabolic panel ordered at this time we'll go ahead and order one.  Time spend: Greater than 45 minutes Code Status: Full code Family Communication: Patient  and granddaughter Disposition Plan: Inpatient  Marinda Elk, MD  [facility] Pager [phone]  If 7PM-7AM, please contact night-coverage [email or web address] Password [email or web address] 12/24/2011, 4:10 PM

## 2011-12-24 NOTE — ED Notes (Signed)
Ring taken off right hand given to family member at bedside.  Hand surgeon at bedside.

## 2011-12-24 NOTE — ED Notes (Signed)
Patient lives at home walking to bathroom could not hold her urine and urinated while walking slipped and fall. Hit head and counter right forehead deformity right wrist pain 10/10 sharp. Fentanyl 150 mcg IV pain 3-4/10 achy Left hand 20G. Radial pulses +2 full sensation. NSR on monitor. Ax4 No LOC.

## 2011-12-25 ENCOUNTER — Inpatient Hospital Stay (HOSPITAL_COMMUNITY): Payer: Medicare HMO

## 2011-12-25 LAB — HEMOGLOBIN A1C: Hgb A1c MFr Bld: 11.8 % — ABNORMAL HIGH (ref <5.7)

## 2011-12-25 LAB — COMPREHENSIVE METABOLIC PANEL
Albumin: 2.9 g/dL — ABNORMAL LOW (ref 3.5–5.2)
BUN: 17 mg/dL (ref 6–23)
Chloride: 104 mEq/L (ref 96–112)
Creatinine, Ser: 0.9 mg/dL (ref 0.50–1.10)
GFR calc Af Amer: 71 mL/min — ABNORMAL LOW (ref >90)
Total Bilirubin: 0.4 mg/dL (ref 0.3–1.2)
Total Protein: 6.7 g/dL (ref 6.0–8.3)

## 2011-12-25 LAB — CBC
MCV: 88.9 fL (ref 78.0–100.0)
Platelets: 263 10*3/uL (ref 150–400)
RBC: 4.31 MIL/uL (ref 3.87–5.11)
WBC: 12.6 10*3/uL — ABNORMAL HIGH (ref 4.0–10.5)

## 2011-12-25 LAB — TSH: TSH: 1.324 u[IU]/mL (ref 0.350–4.500)

## 2011-12-25 LAB — GLUCOSE, CAPILLARY
Glucose-Capillary: 254 mg/dL — ABNORMAL HIGH (ref 70–99)
Glucose-Capillary: 243 mg/dL — ABNORMAL HIGH (ref 70–99)
Glucose-Capillary: 251 mg/dL — ABNORMAL HIGH (ref 70–99)

## 2011-12-25 MED ORDER — ISOSORBIDE MONONITRATE ER 60 MG PO TB24
60.0000 mg | ORAL_TABLET | Freq: Every day | ORAL | Status: DC
  Filled 2011-12-25: qty 1

## 2011-12-25 MED ORDER — INSULIN GLARGINE 100 UNIT/ML ~~LOC~~ SOLN
20.0000 [IU] | Freq: Every day | SUBCUTANEOUS | Status: DC
  Administered 2011-12-25: 20 [IU] via SUBCUTANEOUS

## 2011-12-25 MED ORDER — POTASSIUM CHLORIDE CRYS ER 20 MEQ PO TBCR
20.0000 meq | EXTENDED_RELEASE_TABLET | Freq: Every day | ORAL | Status: DC
  Administered 2011-12-26 – 2011-12-29 (×4): 20 meq via ORAL
  Filled 2011-12-25 (×4): qty 1

## 2011-12-25 MED ORDER — ISOSORBIDE MONONITRATE ER 60 MG PO TB24
120.0000 mg | ORAL_TABLET | Freq: Every day | ORAL | Status: DC
  Administered 2011-12-25 – 2011-12-28 (×4): 120 mg via ORAL
  Filled 2011-12-25 (×5): qty 2

## 2011-12-25 MED ORDER — DEXTROSE 5 % IV SOLN
1.0000 g | INTRAVENOUS | Status: DC
  Administered 2011-12-25 – 2011-12-27 (×3): 1 g via INTRAVENOUS
  Filled 2011-12-25 (×3): qty 10

## 2011-12-25 MED ORDER — POTASSIUM CHLORIDE CRYS ER 20 MEQ PO TBCR
40.0000 meq | EXTENDED_RELEASE_TABLET | Freq: Two times a day (BID) | ORAL | Status: AC
  Administered 2011-12-25 (×2): 40 meq via ORAL
  Filled 2011-12-25 (×2): qty 2

## 2011-12-25 MED ORDER — LABETALOL HCL 5 MG/ML IV SOLN
5.0000 mg | INTRAVENOUS | Status: DC | PRN
  Administered 2011-12-25: 5 mg via INTRAVENOUS
  Filled 2011-12-25 (×4): qty 4

## 2011-12-25 MED ORDER — MORPHINE SULFATE 2 MG/ML IJ SOLN
2.0000 mg | INTRAMUSCULAR | Status: DC | PRN
  Administered 2011-12-27 – 2011-12-28 (×3): 2 mg via INTRAVENOUS
  Filled 2011-12-25 (×3): qty 1

## 2011-12-25 NOTE — Care Management Note (Unsigned)
    Page 1 of 1   12/25/2011     5:21:41 PM   CARE MANAGEMENT NOTE 12/25/2011  Patient:  ALIYAH, ABEYTA   Account Number:  [id number]  Date Initiated:  12/25/2011  Documentation initiated by:  Letha Cape  Subjective/Objective Assessment:   dx uti  admit- lives with spouse.     Action/Plan:   pt eval- snf   Anticipated DC Date:  12/28/2011   Anticipated DC Plan:  HOME W HOME HEALTH SERVICES  In-house referral  Clinical Social Worker      DC Planning Services  CM consult      Choice offered to / List presented to:             Status of service:  In process, will continue to follow Medicare Important Message given?   (If response is "NO", the following Medicare IM given date fields will be blank) Date Medicare IM given:   Date Additional Medicare IM given:    Discharge Disposition:    Per UR Regulation:  Reviewed for med. necessity/level of care/duration of stay  If discussed at Long Length of Stay Meetings, dates discussed:    Comments:  12/25/11 15:30 Letha Cape RN, BSN [phone] patient lives with spouse, per phsycial therapy recs snf, CSW referral. Spoke to patient about going to snf, she was not really sure about but states she would think about it, informed her CSW would talke to her more about the process.

## 2011-12-25 NOTE — Progress Notes (Signed)
Physical Therapy Note: order received, chart reviewed and spoke with RN and Dr.Feliz-Ortiz continue to await weight bearing status of Right hand and leg prior to evaluation initiation. Thanks Delaney Meigs, PT [phone]

## 2011-12-25 NOTE — Evaluation (Signed)
Physical Therapy Evaluation Patient Details Name: Katie Dawson MRN: [id number] DOB: 1933/09/05 Today's Date: 12/25/2011 Time: 4098-1191 PT Time Calculation (min): 29 min  PT Assessment / Plan / Recommendation Clinical Impression  Pt admitted with UTI after fall at home with right wrist fx and right fibula fx, awaiting weight bearing status and assuming NWB both until clarified. Pt very limited by pain with all movement. Will follow acutely to maximize mobility and transfers. Pt incontinent of urine end of session and assisted nursing with positioning and linen change.     PT Assessment  Patient needs continued PT services    Follow Up Recommendations  Skilled nursing facility    Barriers to Discharge Decreased caregiver support      Equipment Recommendations  Defer to next venue    Recommendations for Other Services     Frequency Min 3X/week    Precautions / Restrictions Precautions Precautions: Fall Restrictions Weight Bearing Restrictions: Yes RUE Weight Bearing: Non weight bearing RLE Weight Bearing: Non weight bearing Other Position/Activity Restrictions: assuming NWB RUE and RLE until clarified by MD due to fx   Pertinent Vitals/Pain Unable to rate but states pain at RUE and RLE, RN aware      Mobility  Bed Mobility Bed Mobility: Rolling Right;Rolling Left;Supine to Sit;Sit to Supine;Sitting - Scoot to [place] of Bed;Scooting to [facility] Rolling Right: 1: +1 Total assist Rolling Left: 1: +1 Total assist Supine to Sit: 4: Min assist;HOB elevated (HOB 20 degrees) Sitting - Scoot to Edge of Bed: 3: Mod assist Sit to Supine: 3: Mod assist;HOB flat Scooting to HOB: 3: Mod assist;Other (comment) (bed in trendelenburg, pt able to bridge with LLE to scoot) Details for Bed Mobility Assistance: assist to elevate bil LE onto bed from sitting. For sitting assist to elevate trunk and pt able to move legs to EOB with encouragement and increased time. Pt with extensive assist for  rolling.  Transfers Transfers: Not assessed    Exercises General Exercises - Lower Extremity Short Arc Quad: AROM;Right;5 reps;Seated;Left;10 reps (5 reps on RLE with limited motion) Hip Flexion/Marching: AROM;Left;10 reps;Seated   PT Diagnosis: Difficulty walking;Acute pain;Generalized weakness  PT Problem List: Decreased strength;Decreased range of motion;Decreased activity tolerance;Pain;Obesity PT Treatment Interventions: Functional mobility training;Therapeutic activities;Therapeutic exercise;Patient/family education   PT Goals Acute Rehab PT Goals PT Goal Formulation: With patient Time For Goal Achievement: 01/08/12 Potential to Achieve Goals: Fair Pt will go Supine/Side to Sit: with supervision PT Goal: Supine/Side to Sit - Progress: Goal set today Pt will go Sit to Supine/Side: with min assist PT Goal: Sit to Supine/Side - Progress: Goal set today Pt will Transfer Bed to Chair/Chair to Bed: with +2 total assist;Other (comment) (pt =50%) PT Transfer Goal: Bed to Chair/Chair to Bed - Progress: Goal set today Pt will Perform Home Exercise Program: with supervision, verbal cues required/provided PT Goal: Perform Home Exercise Program - Progress: Goal set today  Visit Information  Last PT Received On: 12/25/11 Assistance Needed: +2    Subjective Data  Subjective: oh, I can't move that leg Patient Stated Goal: be able to go home   Prior Functioning  Home Living Lives With: Spouse Available Help at Discharge: Available 24 hours/day (grandchildren) Type of Home: House Home Access: Stairs to enter [employer] of Steps: 4 Entrance Stairs-Rails: Right;Left;Can reach both Home Layout: Two level;Able to live on main level with bedroom/bathroom Bathroom Shower/Tub: Engineer, manufacturing systems: Standard Home Adaptive Equipment: Shower chair with back;Walker - rolling;Quad cane;Bedside commode/3-in-1;Wheelchair - manual Prior Function  Level of Independence:  Independent Able to Take Stairs?: Yes Driving: Yes 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 Communication Communication: No difficulties Dominant Hand: Right    Cognition  Overall Cognitive Status: Appears within functional limits for tasks assessed/performed Arousal/Alertness: Awake/alert Orientation Level: Appears intact for tasks assessed Behavior During Session: [facility] for tasks performed    Extremity/Trunk Assessment Right Upper Extremity Assessment RUE ROM/Strength/Tone: Deficits;Due to pain Right Lower Extremity Assessment RLE ROM/Strength/Tone: Deficits;Due to pain;Unable to fully assess RLE ROM/Strength/Tone Deficits: pt would barely lift right foot off bed and floor and would state pain with any touch to leg even at thigh Left Lower Extremity Assessment LLE ROM/Strength/Tone: Deficits LLE ROM/Strength/Tone Deficits: grossly 2+/5 strength with limited ROM due to body habitus   Balance Balance Balance Assessed: Yes Static Sitting Balance Static Sitting - Balance Support: No upper extremity supported;Feet supported Static Sitting - Level of Assistance: 5: Stand by assistance Static Sitting - Comment/# of Minutes: 5  End of Session PT - End of Session Activity Tolerance: Patient limited by pain Patient left: in bed;with call bell/phone within reach Nurse Communication: Mobility status  GP     Delorse Lek 12/25/2011, 4:00 PM  Delaney Meigs, PT [phone]

## 2011-12-25 NOTE — Progress Notes (Signed)
Inpatient Diabetes Program Recommendations  AACE/ADA: New Consensus Statement on Inpatient Glycemic Control (2009)  Target Ranges:  Prepandial:   less than 140 mg/dL      Peak postprandial:   less than 180 mg/dL (1-2 hours)      Critically ill patients:  140 - 180 mg/dL    Inpatient Diabetes Program Recommendations Insulin - Basal: Lantus or Levemir 10 units  HgbA1C: =11.8  May need to add basal insulin to home regimen.    Thank you  Katie Dawson [facility] Inpatient Diabetes Coordinator [phone]

## 2011-12-25 NOTE — Progress Notes (Signed)
Pt has an elevated bp of 194/76. MD on call was notified. RN will wait for medication that was ordered to arrive to unit

## 2011-12-25 NOTE — Progress Notes (Addendum)
[facility] PROGRESS NOTE  Katie Dawson ZOX:[id number] DOB: [birth year] DOA: 12/24/2011   Assessment/Plan: Patient Active Hospital Problem List: UTI (lower urinary tract infection) (12/24/2011) -continue Rocephin. Has remained afebrile leukocytosis improving. -Urine cultures are pending   DIABETES MELLITUS, TYPE II (04/22/2007) -blood glucose continued to be high resume her glipizide. -My guess she'll need Lantus we'll start her on low-dose Lantus and continue sliding scale insulin.  ANEMIA-IRON DEFICIENCY (04/22/2007) -MCV within normal limits, continue her sulfate.   HYPERTENSION (04/22/2007) -Blood pressure continued to be high  -Increase and/or continue to monitor her blood pressure.  RENAL INSUFFICIENCY (04/22/2007) -continues to be at baseline. It is to note that she's had a previous nephrectomy for a cancerous lesion.  Closed fibular fracture (12/24/2011) -further recommendations per pending. PT consult pending at this time for full weightbearing exercises. -? Weight bearing status.  Wrist fracture, closed (12/24/2011) -close reduction in the ED followup with Dr. Liz Malady as an outpatient in 2 weeks. -Increase narcotics , will add IV medications when necessary.    Code Status: Full code Family Communication: Other [phone] Disposition Plan: SNF     LOS: 1 day   Procedures:  Closed reduction  Antibiotics:  Rocephin 12/23/2028    Subjective: She continues to have pain in her left wrist. Tolerating her diet, however was no further complaints.  Objective: Filed Vitals:   12/24/11 1423 12/24/11 2034 12/25/11 0056 12/25/11 0604  BP: 204/78 178/71  194/76  Pulse: 82 73  78  Temp: 98.5 F (36.9 C) 98 F (36.7 C)  97.9 F (36.6 C)  TempSrc: Oral Oral  Oral  Resp: 20 20  18   Height:   5\' 6"  (1.676 m)   Weight:   133.811 kg (295 lb)   SpO2: 96% 95%  95%    Intake/Output Summary (Last 24 hours) at 12/25/11 0947 Last data filed at 12/25/11  0500  Gross per 24 hour  Intake 1227.5 ml  Output      0 ml  Net 1227.5 ml   Weight change:   Exam:  General: Alert, awake, oriented x3, in no acute distress.  HEENT: No bruits, no goiter.  Heart: Regular rate and rhythm, without murmurs, rubs, gallops.  Lungs: Good air movement, bilateral air movement.  Abdomen: Soft, nontender, nondistended, positive bowel sounds.  Neuro: Grossly intact, nonfocal.   Data Reviewed: Basic Metabolic Panel:  Lab 12/25/11 5621 12/24/11 1740  NA 142 141  K 3.4* 3.7  CL 104 102  CO2 27 26  GLUCOSE 323* 372*  BUN 17 14  CREATININE 0.90 0.76  CALCIUM 9.6 9.6  MG -- --  PHOS -- --   Liver Function Tests:  Lab 12/25/11 0526 12/24/11 1740  AST 10 14  ALT 13 16  ALKPHOS 82 93  BILITOT 0.4 0.5  PROT 6.7 7.3  ALBUMIN 2.9* 3.2*   No results found for this basename: LIPASE:5,AMYLASE:5 in the last 168 hours No results found for this basename: AMMONIA:5 in the last 168 hours CBC:  Lab 12/25/11 0526 12/24/11 1740  WBC 12.6* 13.8*  NEUTROABS -- 12.2*  HGB 12.4 13.3  HCT 38.3 40.5  MCV 88.9 88.0  PLT 263 261   Cardiac Enzymes: No results found for this basename: CKTOTAL:5,CKMB:5,CKMBINDEX:5,TROPONINI:5 in the last 168 hours BNP: No components found with this basename: POCBNP:5 CBG:  Lab 12/25/11 0805 12/24/11 2142 12/24/11 1717  GLUCAP 254* 340* 351*    No results found for this or any previous visit (from the past 240 hour(s)).  Studies: Dg Wrist Complete Right  12/24/2011  *RADIOLOGY REPORT*  Clinical Data: Fall, wrist deformity.  RIGHT WRIST - COMPLETE 3+ VIEW  Comparison: None.  Findings: There is a comminuted fracture of the distal radius which extends to the joint space. A long segment oblique fracture extends more proximally toward the radial shaft.   Mild dorsal angulation. Moderate displacement of the distal radius and carpal bones away from the ulna.  Ulnar styloid intact.  No carpal bone fracture.  IMPRESSION: Distal  radius fracture as described.  Original Report Authenticated By: Elsie Stain, M.D.   Ct Head Wo Contrast  12/24/2011  *RADIOLOGY REPORT*  Clinical Data:  Fall.  Trauma to the head and neck.  CT HEAD WITHOUT CONTRAST CT CERVICAL SPINE WITHOUT CONTRAST  Technique:  Multidetector CT imaging of the head and cervical spine was performed following the standard protocol without intravenous contrast.  Multiplanar CT image reconstructions of the cervical spine were also generated.  Comparison:  03/30/2009  CT HEAD  Findings: The brain shows age related atrophy with chronic appearing small vessel changes affecting the hemispheric deep white matter.  No sign of acute infarction, mass lesion, hemorrhage, hydrocephalus or extra-axial collection.  No skull fracture.  No fluid in the sinuses.  There is right frontal scalp swelling. Atherosclerotic calcification effects major vessels at the base of the brain.  IMPRESSION: Age related atrophy and chronic small vessel change.  No acute intracranial finding.  Right frontal scalp swelling.  No skull fracture.  CT CERVICAL SPINE  Findings: No fracture or traumatic malalignment.  No soft tissue swelling.  There is ordinary degenerative arthritis at the C1-2 level.  There are shallow central disc protrusions at C2-3 and C3- 4.  C4-5 shows spondylosis with endplate osteophytes and shallow protrusion of disc material.  The C5-6 shows pronounced chronic spondylosis with endplate osteophytes more prominent on the right than the left.  This narrows the right side of the canal and both neural foramina right more than left.  IMPRESSION: No acute or traumatic finding.  Degenerative changes as above with spinal stenosis because of osteophytes at the C5-6 level worse on the right.  Original Report Authenticated By: Thomasenia Sales, M.D.   Ct Cervical Spine Wo Contrast  12/24/2011  *RADIOLOGY REPORT*  Clinical Data:  Fall.  Trauma to the head and neck.  CT HEAD WITHOUT CONTRAST CT CERVICAL  SPINE WITHOUT CONTRAST  Technique:  Multidetector CT imaging of the head and cervical spine was performed following the standard protocol without intravenous contrast.  Multiplanar CT image reconstructions of the cervical spine were also generated.  Comparison:  03/30/2009  CT HEAD  Findings: The brain shows age related atrophy with chronic appearing small vessel changes affecting the hemispheric deep white matter.  No sign of acute infarction, mass lesion, hemorrhage, hydrocephalus or extra-axial collection.  No skull fracture.  No fluid in the sinuses.  There is right frontal scalp swelling. Atherosclerotic calcification effects major vessels at the base of the brain.  IMPRESSION: Age related atrophy and chronic small vessel change.  No acute intracranial finding.  Right frontal scalp swelling.  No skull fracture.  CT CERVICAL SPINE  Findings: No fracture or traumatic malalignment.  No soft tissue swelling.  There is ordinary degenerative arthritis at the C1-2 level.  There are shallow central disc protrusions at C2-3 and C3- 4.  C4-5 shows spondylosis with endplate osteophytes and shallow protrusion of disc material.  The C5-6 shows pronounced chronic spondylosis with endplate osteophytes more  prominent on the right than the left.  This narrows the right side of the canal and both neural foramina right more than left.  IMPRESSION: No acute or traumatic finding.  Degenerative changes as above with spinal stenosis because of osteophytes at the C5-6 level worse on the right.  Original Report Authenticated By: Thomasenia Sales, M.D.   Dg Knee Complete 4 Views Right  12/24/2011  *RADIOLOGY REPORT*  Clinical Data: Fall.  Swelling.  RIGHT KNEE - COMPLETE 4+ VIEW  Comparison: None.  Findings: Fibular head fracture.  Sclerotic appearance of the right lateral femoral condyle may represent result of osteonecrosis or bony infarct.  There is slight incongruity of the articular surface which may be related to superimposed  degenerative changes although difficult to exclude subtle impaction fracture of the right lateral femoral condyle.  Tricompartment degenerative changes.  Small suprapatellar joint effusion.  IMPRESSION: Fibular head fracture.  Sclerotic appearance of the right lateral femoral condyle may represent result of osteonecrosis or bony infarct.  There is slight incongruity of the articular surface which may be related to superimposed degenerative changes although difficult to exclude subtle impaction fracture of the right lateral femoral condyle.  Tricompartment degenerative changes.  Small suprapatellar joint effusion.  Original Report Authenticated By: Fuller Canada, M.D.    Scheduled Meds:   . amLODipine  10 mg Oral Daily  . antiseptic oral rinse  15 mL Mouth Rinse BID  . benazepril  40 mg Oral BID  . cefTRIAXone (ROCEPHIN)  IV  1 g Intravenous Once  . cefTRIAXone (ROCEPHIN)  IV  1 g Intravenous Q24H  . cloNIDine  0.1 mg Oral BID  . fentaNYL  50 mcg Intravenous Once  . glipiZIDE  5 mg Oral Q breakfast  . heparin  5,000 Units Subcutaneous Q8H  . insulin aspart  0-15 Units Subcutaneous TID WC  . isosorbide mononitrate  60 mg Oral QHS  . labetalol  200 mg Oral Once  . simvastatin  20 mg Oral QPM  . DISCONTD: lidocaine  20 mL Other Once   Continuous Infusions:   . sodium chloride 75 mL/hr at 12/25/11 0500    Lambert Keto, MD  [facility] Pager [phone]  If 7PM-7AM, please contact night-coverage [email or web address] Password [facility] 12/25/2011, 9:47 AM

## 2011-12-25 NOTE — Consult Note (Signed)
Katie Barre, MD Chief Complaint: fall History: 78 year old female with past medical history of hypertension, diabetes mellitus status post nephrectomy appropriate answers liver lesion and no chemotherapy that comes in for fall. She relates she has been having dysuria more frequently than usual, she woke up this morning try to go to the bathroom but became incontinent and slipped and fell in her legs, she also hit her head. She relates she has been slightly dizzy upon standing, but she continued to take her blood pressure medication. She relates she feels like she is always thirsty. She does not lose consciousness during this time. He does relate that after she fell she started having aches in her wrist and her left leg. She relates no back pain.  Past Medical History  Diagnosis Date  . DIABETES MELLITUS, TYPE II 04/22/2007  . HYPERLIPIDEMIA 04/22/2007  . GOUT 04/22/2007  . ANEMIA-IRON DEFICIENCY 04/22/2007  . ANXIETY 04/22/2007  . OBSTRUCTIVE SLEEP APNEA 04/22/2007  . HYPERTENSION 04/22/2007  . GERD 04/22/2007  . RENAL INSUFFICIENCY 04/22/2007  . 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 FOOT PAIN, LEFT 12/23/2007  . OSTEOPENIA 02/12/2009  . INTERMITTENT VERTIGO 04/20/2009  . HYPERSOMNIA 01/11/2009  . COLONIC POLYPS, HX OF 04/22/2007  . NEPHRECTOMY, HX OF 04/22/2007    Allergies  Allergen Reactions  . Indomethacin     REACTION: renal insuff    No current facility-administered medications on file prior to encounter.   Current Outpatient Prescriptions on File Prior to Encounter  Medication Sig Dispense Refill  . amLODipine (NORVASC) 10 MG tablet Take 10 mg by mouth daily.        . benazepril (LOTENSIN) 40 MG tablet Take 1 tablet (40 mg total) by mouth 2 (two) times daily.  180 tablet  1  . cloNIDine (CATAPRES) 0.1 MG tablet TAKE 1 TABLET TWICE DAILY.  60 tablet  9  . Ferrous Sulfate (IRON) 325 (65 FE) MG TABS Take by mouth daily.        Marland Kitchen GLUCOTROL XL 10 MG 24 hr tablet TAKE  (2) TABLETS DAILY.  180 each  0  . isosorbide mononitrate (IMDUR) 60 MG 24 hr tablet Take 60 mg by mouth at bedtime.        Marland Kitchen labetalol (NORMODYNE) 200 MG tablet TAKE 1 TABLET TWICE DAILY.  60 tablet  0  . LORazepam (ATIVAN) 1 MG tablet Take 1 tablet (1 mg total) by mouth 2 (two) times daily as needed for anxiety.  60 tablet  2  . potassium chloride SA (K-DUR,KLOR-CON) 20 MEQ tablet Take 20 mEq by mouth daily.      . simvastatin (ZOCOR) 20 MG tablet Take 20 mg by mouth every evening.        Physical Exam: Filed Vitals:   12/25/11 0956  BP: 167/74  Pulse:   Temp:   Resp:   no sob/cp A+O x3 abd soft/nt Right wrist:  Swelling in the hand.  spint inplace R le: NVI         Compartments soft/nt         Sensation intact         Pain with ankle ROM         No deformity No other extremity complaints  Image: Dg Wrist Complete Right  12/24/2011  *RADIOLOGY REPORT*  Clinical Data: Fall, wrist deformity.  RIGHT WRIST - COMPLETE 3+ VIEW  Comparison: None.  Findings: There is a comminuted fracture of the distal radius which extends to the joint space. A long segment oblique  fracture extends more proximally toward the radial shaft.   Mild dorsal angulation. Moderate displacement of the distal radius and carpal bones away from the ulna.  Ulnar styloid intact.  No carpal bone fracture.  IMPRESSION: Distal radius fracture as described.  Original Report Authenticated By: Elsie Stain, M.D.   Ct Head Wo Contrast  12/24/2011  *RADIOLOGY REPORT*  Clinical Data:  Fall.  Trauma to the head and neck.  CT HEAD WITHOUT CONTRAST CT CERVICAL SPINE WITHOUT CONTRAST  Technique:  Multidetector CT imaging of the head and cervical spine was performed following the standard protocol without intravenous contrast.  Multiplanar CT image reconstructions of the cervical spine were also generated.  Comparison:  03/30/2009  CT HEAD  Findings: The brain shows age related atrophy with chronic appearing small vessel changes  affecting the hemispheric deep white matter.  No sign of acute infarction, mass lesion, hemorrhage, hydrocephalus or extra-axial collection.  No skull fracture.  No fluid in the sinuses.  There is right frontal scalp swelling. Atherosclerotic calcification effects major vessels at the base of the brain.  IMPRESSION: Age related atrophy and chronic small vessel change.  No acute intracranial finding.  Right frontal scalp swelling.  No skull fracture.  CT CERVICAL SPINE  Findings: No fracture or traumatic malalignment.  No soft tissue swelling.  There is ordinary degenerative arthritis at the C1-2 level.  There are shallow central disc protrusions at C2-3 and C3- 4.  C4-5 shows spondylosis with endplate osteophytes and shallow protrusion of disc material.  The C5-6 shows pronounced chronic spondylosis with endplate osteophytes more prominent on the right than the left.  This narrows the right side of the canal and both neural foramina right more than left.  IMPRESSION: No acute or traumatic finding.  Degenerative changes as above with spinal stenosis because of osteophytes at the C5-6 level worse on the right.  Original Report Authenticated By: Thomasenia Sales, M.D.   Ct Cervical Spine Wo Contrast  12/24/2011  *RADIOLOGY REPORT*  Clinical Data:  Fall.  Trauma to the head and neck.  CT HEAD WITHOUT CONTRAST CT CERVICAL SPINE WITHOUT CONTRAST  Technique:  Multidetector CT imaging of the head and cervical spine was performed following the standard protocol without intravenous contrast.  Multiplanar CT image reconstructions of the cervical spine were also generated.  Comparison:  03/30/2009  CT HEAD  Findings: The brain shows age related atrophy with chronic appearing small vessel changes affecting the hemispheric deep white matter.  No sign of acute infarction, mass lesion, hemorrhage, hydrocephalus or extra-axial collection.  No skull fracture.  No fluid in the sinuses.  There is right frontal scalp swelling.  Atherosclerotic calcification effects major vessels at the base of the brain.  IMPRESSION: Age related atrophy and chronic small vessel change.  No acute intracranial finding.  Right frontal scalp swelling.  No skull fracture.  CT CERVICAL SPINE  Findings: No fracture or traumatic malalignment.  No soft tissue swelling.  There is ordinary degenerative arthritis at the C1-2 level.  There are shallow central disc protrusions at C2-3 and C3- 4.  C4-5 shows spondylosis with endplate osteophytes and shallow protrusion of disc material.  The C5-6 shows pronounced chronic spondylosis with endplate osteophytes more prominent on the right than the left.  This narrows the right side of the canal and both neural foramina right more than left.  IMPRESSION: No acute or traumatic finding.  Degenerative changes as above with spinal stenosis because of osteophytes at the C5-6 level worse  on the right.  Original Report Authenticated By: Thomasenia Sales, M.D.   Dg Knee Complete 4 Views Right  12/24/2011  *RADIOLOGY REPORT*  Clinical Data: Fall.  Swelling.  RIGHT KNEE - COMPLETE 4+ VIEW  Comparison: None.  Findings: Fibular head fracture.  Sclerotic appearance of the right lateral femoral condyle may represent result of osteonecrosis or bony infarct.  There is slight incongruity of the articular surface which may be related to superimposed degenerative changes although difficult to exclude subtle impaction fracture of the right lateral femoral condyle.  Tricompartment degenerative changes.  Small suprapatellar joint effusion.  IMPRESSION: Fibular head fracture.  Sclerotic appearance of the right lateral femoral condyle may represent result of osteonecrosis or bony infarct.  There is slight incongruity of the articular surface which may be related to superimposed degenerative changes although difficult to exclude subtle impaction fracture of the right lateral femoral condyle.  Tricompartment degenerative changes.  Small suprapatellar  joint effusion.  Original Report Authenticated By: Fuller Canada, M.D.    A/P: 78 yr old with multiple medical issues who fell.  Complains of right leg and wrist pain.  Right wrist fracture managed by Dr Mina Marble.  Called to see patient concerning right fibular head fracture.  No evidence of peroneal nerve dysfunction.  Complains of ankle pain - recommend xrays to r/o fracture.  Will monitor exam.  No need for surgical management of fibular head fracture.

## 2011-12-26 ENCOUNTER — Inpatient Hospital Stay (HOSPITAL_COMMUNITY): Payer: Medicare HMO

## 2011-12-26 DIAGNOSIS — I10 Essential (primary) hypertension: Secondary | ICD-10-CM | POA: Diagnosis present

## 2011-12-26 LAB — GLUCOSE, CAPILLARY
Glucose-Capillary: 256 mg/dL — ABNORMAL HIGH (ref 70–99)
Glucose-Capillary: 267 mg/dL — ABNORMAL HIGH (ref 70–99)
Glucose-Capillary: 253 mg/dL — ABNORMAL HIGH (ref 70–99)

## 2011-12-26 LAB — URINE CULTURE: Colony Count: 2000

## 2011-12-26 MED ORDER — HYDROCHLOROTHIAZIDE 25 MG PO TABS
12.5000 mg | ORAL_TABLET | Freq: Every day | ORAL | Status: DC
  Administered 2011-12-26: 12.5 mg via ORAL
  Filled 2011-12-26 (×2): qty 0.5

## 2011-12-26 MED ORDER — INSULIN GLARGINE 100 UNIT/ML ~~LOC~~ SOLN
40.0000 [IU] | Freq: Every day | SUBCUTANEOUS | Status: DC
  Administered 2011-12-26: 40 [IU] via SUBCUTANEOUS

## 2011-12-26 MED ORDER — GLIPIZIDE ER 10 MG PO TB24
10.0000 mg | ORAL_TABLET | Freq: Every day | ORAL | Status: DC
  Administered 2011-12-27 – 2011-12-29 (×3): 10 mg via ORAL
  Filled 2011-12-26 (×4): qty 1

## 2011-12-26 NOTE — Progress Notes (Signed)
Subjective: Pt c/o pain at right ankel and left foot.  No splint applied yet.    Objective: Vital signs in last 24 hours: Temp:  [98.3 F (36.8 C)-99.9 F (37.7 C)] 99.9 F (37.7 C) (07/26 0538) Pulse Rate:  [83-92] 85  (07/26 0538) Resp:  [16-20] 20  (07/26 0538) BP: (153-209)/(73-84) 165/82 mmHg (07/26 1006) SpO2:  [91 %-95 %] 93 % (07/26 0538)  Intake/Output from previous day: 07/25 0701 - 07/26 0700 In: 532.3 [I.V.:532.3] Out: -  Intake/Output this shift:     Basename 12/25/11 0526 12/24/11 1740  HGB 12.4 13.3    Basename 12/25/11 0526 12/24/11 1740  WBC 12.6* 13.8*  RBC 4.31 4.60  HCT 38.3 40.5  PLT 263 261    Basename 12/25/11 0526 12/24/11 1740  NA 142 141  K 3.4* 3.7  CL 104 102  CO2 27 26  BUN 17 14  CREATININE 0.90 0.76  GLUCOSE 323* 372*  CALCIUM 9.6 9.6   No results found for this basename: LABPT:2,INR:2 in the last 72 hours  PE:  Left foot without gross deformity, bruising or swelling.  TTP at hallux.  R ankle swollen.  No gros deformity.  TTP at [facility].  5/5 strength in PF and DF.  Sens to LT intact.  Xray:  L foot films show no fx or dislocation.  R ankle films show nondisplaced lat mal fx.  Assessment/Plan: R ankle / proximal fibula fx.  Closed treatment with SLS.  NWB on R LE.  Pt will need to f/u with me in two weeks in clnic to have a cast put on.  L foot contusion.  wbat in regular footwear.  Expect sxs to resolve spontaneously.  I'll see the pt again Sunday.   Toni Arthurs 12/26/2011, 12:46 PM

## 2011-12-26 NOTE — Progress Notes (Addendum)
Clinical Social Work Department CLINICAL SOCIAL WORK PLACEMENT NOTE 12/26/2011  Patient:  PARKER, SAWATZKY  Account Number:  [id number] Admit date:  12/24/2011  Clinical Social Worker:  Jacelyn Grip  Date/time:  12/26/2011 10:30 AM  Clinical Social Work is seeking post-discharge placement for this patient at the following level of care:   SKILLED NURSING   (*CSW will update this form in Epic as items are completed)   12/26/2011  Patient/family provided with [facility] Department of Clinical Social Work's list of facilities offering this level of care within the geographic area requested by the patient (or if unable, by the patient's family).  12/26/2011  Patient/family informed of their freedom to choose among providers that offer the needed level of care, that participate in Medicare, Medicaid or managed care program needed by the patient, have an available bed and are willing to accept the patient.  12/26/2011  Patient/family informed of [facility]' ownership interest in [facility], as well as of the fact that they are under no obligation to receive care at this facility.  PASARR submitted to EDS on 12/26/2011 PASARR number received from EDS on 12/29/2011  FL2 transmitted to all facilities in geographic area requested by pt/family on  12/26/2011 FL2 transmitted to all facilities within larger geographic area on   Patient informed that his/her managed care company has contracts with or will negotiate with  certain facilities, including the following:     Patient/family informed of bed offers received:12/26/2011 and 12/29/2011   Patient chooses bed at [facility] Physician recommends and patient chooses bed at    Patient to be transferred to  on  [facility] on 12/29/2011 Patient to be transferred to facility by ambulance  The following physician request were entered in Epic:   Additional Comments:   Jacklynn Lewis,  MSW, [facility]  Clinical Social Work [phone]

## 2011-12-26 NOTE — Progress Notes (Signed)
Orthopedic Tech Progress Note Patient Details:  Katie Dawson 1933/06/08 [id number]  Casting Type of Cast: Short leg cast Cast Location: right LE Cast Material: Fiberglass Cast Intervention: Application     Kenyetta Wimbish T 12/26/2011, 5:00 PM

## 2011-12-26 NOTE — Progress Notes (Signed)
PT Cancellation Note  Treatment cancelled today due to pt awaiting splint from ortho tech for RLE prior to further mobility. Will attempt at later date once splint in place and continue with NWB status.  Delaney Meigs, PT [phone]

## 2011-12-26 NOTE — Progress Notes (Deleted)
Ortho tech pager number: [phone]

## 2011-12-26 NOTE — Progress Notes (Signed)
[facility] PROGRESS NOTE  RADA ZEGERS WUJ:[id number] DOB: 08/25/1933 DOA: 12/24/2011   Assessment/Plan: Patient Active Hospital Problem List: UTI (lower urinary tract infection) (12/24/2011) -continue Rocephin. Has remained afebrile leukocytosis improving. -Urine cultures are pending   DIABETES MELLITUS, TYPE II (04/22/2007) -blood glucose continues to be high, increase her glipizide. -Go ahead and increase Lantus.  ANEMIA-IRON DEFICIENCY (04/22/2007) -MCV within normal limits, continue her sulfate.   HYPERTENSION (04/22/2007) -Blood pressure continued to be high  -Increase and/or continue to monitor her blood pressure. -Start hydrochlorothiazide.  RENAL INSUFFICIENCY (04/22/2007) -continues to be at baseline. It is to note that she's had a previous nephrectomy for a cancerous lesion.  Closed fibular fracture (12/24/2011) -old who was consulted they recommended conservative management. -Nonweightbearing status for one month. -We'll need skilled nursing facility  Wrist fracture, closed (12/24/2011) -close reduction in the ED followup with Dr. Liz Malady as an outpatient in 2 weeks. -Increase narcotics , will add IV medications when necessary.    Code Status: Full code Family Communication: Other [phone] Disposition Plan: SNF     LOS: 2 days   Procedures:  Closed reduction  Antibiotics:  Rocephin 12/23/2028    Subjective: She continues to have pain in her left wrist. Tolerating her diet, however was no further complaints.  Objective: Filed Vitals:   12/25/11 2053 12/25/11 2231 12/26/11 0538 12/26/11 1006  BP: 209/77 170/84 165/84 165/82  Pulse: 92 86 85   Temp: 99.3 F (37.4 C) 99.2 F (37.3 C) 99.9 F (37.7 C)   TempSrc: Oral Oral Oral   Resp: 16 18 20    Height:      Weight:      SpO2:  91% 93%     Intake/Output Summary (Last 24 hours) at 12/26/11 1234 Last data filed at 12/25/11 1800  Gross per 24 hour  Intake 312.25 ml  Output       0 ml  Net 312.25 ml   Weight change:   Exam:  General: Alert, awake, oriented x3, in no acute distress.  HEENT: No bruits, no goiter.  Heart: Regular rate and rhythm, without murmurs, rubs, gallops.  Lungs: Good air movement, bilateral air movement.  Abdomen: Soft, nontender, nondistended, positive bowel sounds.  Neuro: Grossly intact, nonfocal.   Data Reviewed: Basic Metabolic Panel:  Lab 12/25/11 7846 12/24/11 1740  NA 142 141  K 3.4* 3.7  CL 104 102  CO2 27 26  GLUCOSE 323* 372*  BUN 17 14  CREATININE 0.90 0.76  CALCIUM 9.6 9.6  MG -- --  PHOS -- --   Liver Function Tests:  Lab 12/25/11 0526 12/24/11 1740  AST 10 14  ALT 13 16  ALKPHOS 82 93  BILITOT 0.4 0.5  PROT 6.7 7.3  ALBUMIN 2.9* 3.2*   No results found for this basename: LIPASE:5,AMYLASE:5 in the last 168 hours No results found for this basename: AMMONIA:5 in the last 168 hours CBC:  Lab 12/25/11 0526 12/24/11 1740  WBC 12.6* 13.8*  NEUTROABS -- 12.2*  HGB 12.4 13.3  HCT 38.3 40.5  MCV 88.9 88.0  PLT 263 261   Cardiac Enzymes: No results found for this basename: CKTOTAL:5,CKMB:5,CKMBINDEX:5,TROPONINI:5 in the last 168 hours BNP: No components found with this basename: POCBNP:5 CBG:  Lab 12/26/11 1127 12/26/11 0801 12/25/11 2121 12/25/11 1746 12/25/11 1151  GLUCAP 267* 256* 209* 251* 243*    No results found for this or any previous visit (from the past 240 hour(s)).   Studies: Dg Wrist Complete Right  12/24/2011  *  RADIOLOGY REPORT*  Clinical Data: Fall, wrist deformity.  RIGHT WRIST - COMPLETE 3+ VIEW  Comparison: None.  Findings: There is a comminuted fracture of the distal radius which extends to the joint space. A long segment oblique fracture extends more proximally toward the radial shaft.   Mild dorsal angulation. Moderate displacement of the distal radius and carpal bones away from the ulna.  Ulnar styloid intact.  No carpal bone fracture.  IMPRESSION: Distal radius fracture as  described.  Original Report Authenticated By: Elsie Stain, M.D.   Ct Head Wo Contrast  12/24/2011  *RADIOLOGY REPORT*  Clinical Data:  Fall.  Trauma to the head and neck.  CT HEAD WITHOUT CONTRAST CT CERVICAL SPINE WITHOUT CONTRAST  Technique:  Multidetector CT imaging of the head and cervical spine was performed following the standard protocol without intravenous contrast.  Multiplanar CT image reconstructions of the cervical spine were also generated.  Comparison:  03/30/2009  CT HEAD  Findings: The brain shows age related atrophy with chronic appearing small vessel changes affecting the hemispheric deep white matter.  No sign of acute infarction, mass lesion, hemorrhage, hydrocephalus or extra-axial collection.  No skull fracture.  No fluid in the sinuses.  There is right frontal scalp swelling. Atherosclerotic calcification effects major vessels at the base of the brain.  IMPRESSION: Age related atrophy and chronic small vessel change.  No acute intracranial finding.  Right frontal scalp swelling.  No skull fracture.  CT CERVICAL SPINE  Findings: No fracture or traumatic malalignment.  No soft tissue swelling.  There is ordinary degenerative arthritis at the C1-2 level.  There are shallow central disc protrusions at C2-3 and C3- 4.  C4-5 shows spondylosis with endplate osteophytes and shallow protrusion of disc material.  The C5-6 shows pronounced chronic spondylosis with endplate osteophytes more prominent on the right than the left.  This narrows the right side of the canal and both neural foramina right more than left.  IMPRESSION: No acute or traumatic finding.  Degenerative changes as above with spinal stenosis because of osteophytes at the C5-6 level worse on the right.  Original Report Authenticated By: Thomasenia Sales, M.D.   Ct Cervical Spine Wo Contrast  12/24/2011  *RADIOLOGY REPORT*  Clinical Data:  Fall.  Trauma to the head and neck.  CT HEAD WITHOUT CONTRAST CT CERVICAL SPINE WITHOUT  CONTRAST  Technique:  Multidetector CT imaging of the head and cervical spine was performed following the standard protocol without intravenous contrast.  Multiplanar CT image reconstructions of the cervical spine were also generated.  Comparison:  03/30/2009  CT HEAD  Findings: The brain shows age related atrophy with chronic appearing small vessel changes affecting the hemispheric deep white matter.  No sign of acute infarction, mass lesion, hemorrhage, hydrocephalus or extra-axial collection.  No skull fracture.  No fluid in the sinuses.  There is right frontal scalp swelling. Atherosclerotic calcification effects major vessels at the base of the brain.  IMPRESSION: Age related atrophy and chronic small vessel change.  No acute intracranial finding.  Right frontal scalp swelling.  No skull fracture.  CT CERVICAL SPINE  Findings: No fracture or traumatic malalignment.  No soft tissue swelling.  There is ordinary degenerative arthritis at the C1-2 level.  There are shallow central disc protrusions at C2-3 and C3- 4.  C4-5 shows spondylosis with endplate osteophytes and shallow protrusion of disc material.  The C5-6 shows pronounced chronic spondylosis with endplate osteophytes more prominent on the right than the left.  This narrows the right side of the canal and both neural foramina right more than left.  IMPRESSION: No acute or traumatic finding.  Degenerative changes as above with spinal stenosis because of osteophytes at the C5-6 level worse on the right.  Original Report Authenticated By: Thomasenia Sales, M.D.   Dg Knee Complete 4 Views Right  12/24/2011  *RADIOLOGY REPORT*  Clinical Data: Fall.  Swelling.  RIGHT KNEE - COMPLETE 4+ VIEW  Comparison: None.  Findings: Fibular head fracture.  Sclerotic appearance of the right lateral femoral condyle may represent result of osteonecrosis or bony infarct.  There is slight incongruity of the articular surface which may be related to superimposed degenerative  changes although difficult to exclude subtle impaction fracture of the right lateral femoral condyle.  Tricompartment degenerative changes.  Small suprapatellar joint effusion.  IMPRESSION: Fibular head fracture.  Sclerotic appearance of the right lateral femoral condyle may represent result of osteonecrosis or bony infarct.  There is slight incongruity of the articular surface which may be related to superimposed degenerative changes although difficult to exclude subtle impaction fracture of the right lateral femoral condyle.  Tricompartment degenerative changes.  Small suprapatellar joint effusion.  Original Report Authenticated By: Fuller Canada, M.D.    Scheduled Meds:    . amLODipine  10 mg Oral Daily  . antiseptic oral rinse  15 mL Mouth Rinse BID  . benazepril  40 mg Oral BID  . cefTRIAXone (ROCEPHIN)  IV  1 g Intravenous Q24H  . cloNIDine  0.1 mg Oral BID  . glipiZIDE  5 mg Oral Q breakfast  . heparin  5,000 Units Subcutaneous Q8H  . insulin aspart  0-15 Units Subcutaneous TID WC  . insulin glargine  20 Units Subcutaneous QHS  . isosorbide mononitrate  120 mg Oral QHS  . potassium chloride SA  20 mEq Oral Daily  . potassium chloride  40 mEq Oral BID  . simvastatin  20 mg Oral QPM  . DISCONTD: isosorbide mononitrate  60 mg Oral QHS   Continuous Infusions:    . sodium chloride 20 mL/hr at 12/25/11 0957    Lambert Keto, MD  [facility] Pager [phone]  If 7PM-7AM, please contact night-coverage [email or web address] Password [facility] 12/26/2011, 12:34 PM

## 2011-12-26 NOTE — Clinical Social Work Psychosocial (Signed)
     Clinical Social Work Department BRIEF PSYCHOSOCIAL ASSESSMENT 12/26/2011  Patient:  Katie Dawson, Katie Dawson     Account Number:  [id number]     Admit date:  12/24/2011  Clinical Social Worker:  Jacelyn Grip  Date/Time:  12/26/2011 10:45 AM  Referred by:  Physician  Date Referred:  12/26/2011 Referred for  SNF Placement   Other Referral:   Interview type:  Patient Other interview type:    PSYCHOSOCIAL DATA Living Status:  FAMILY Admitted from facility:   Level of care:   Primary support name:  Marchelle Folks [place]/other/[phone] Primary support relationship to patient:  FAMILY Degree of support available:   unclear at this time, but patient reports that she lives with family    CURRENT CONCERNS Current Concerns  Post-Acute Placement   Other Concerns:    SOCIAL WORK ASSESSMENT / PLAN CSW met with pt at bedside to discuss discharge planning. Discussed recommendation for SNF placement at discharge. Pt reports that she lives with family and recognizes that she is going to require a higher level of care at discharge due to not being able to bear weight on left leg. CSW explained process of SNF placement and pt reports that she has been to SNF before but unable to recall the name of SNF. Pt agreeable to SNF search in [facility]. CSW explained that pt insurance may require copayments and will require authorization prior to discharge from hospital. CSW completed FL2 and initiated SNF search to [facility]. CSW to follow up with pt in regard to bed offers. CSW to facilitate pt discharge needs when pt medically stable for discharge and insurance authorization received.   Assessment/plan status:  Psychosocial Support/Ongoing Assessment of Needs Other assessment/ plan:   discharge planning   Information/referral to community resources:   [facility] list    PATIENTS/FAMILYS RESPONSE TO PLAN OF CARE: Pt alert and oriented. Pt hesitant about rehab knowing that she  will be unable to bear weight on her left leg. Pt reports that her family may assist her with decision, but knows that ultimately it will be her decision on where she wants to go for rehab.

## 2011-12-26 NOTE — Progress Notes (Signed)
Clinical Social Worker provided pt bed offers available at this time. Pt reviewed bed offers and stated that she wanted to take time to look over bed offers and discuss with family. Clinical Social Worker will ask weekend Clinical Social Worker to follow up with any updated bed offers and get decision for SNF. Pt expressed interest in [name] during discussion. Clinical Social Worker notified [name] of pt interest. Clinical Social Worker contacted [facility] hospital liaison, Hortencia Pilar and notified of pt need for authorization for SNF placement. Clinical Social Worker to continue to follow and facilitate pt discharge needs when pt medically ready for discharge and insurance authorization received.  Katie Dawson, MSW, LCSWA  Clinical Social Work [phone]

## 2011-12-26 NOTE — Progress Notes (Signed)
Inpatient Diabetes Program Recommendations  AACE/ADA: New Consensus Statement on Inpatient Glycemic Control (2009)  Target Ranges:  Prepandial:   less than 140 mg/dL      Peak postprandial:   less than 180 mg/dL (1-2 hours)      Critically ill patients:  140 - 180 mg/dL   Reason for Visit: CBGs 12/25/11  [phone] mg/dl       09[birth year]02/45  425-956 mg/dl  Inpatient Diabetes Program Recommendations Insulin - Basal: . Increase Lantus to 25 units daily if CBGs continue to be greater than 180 mg/dl.  Titrate as needed. HgbA1C: .  Note:

## 2011-12-27 DIAGNOSIS — I10 Essential (primary) hypertension: Secondary | ICD-10-CM

## 2011-12-27 LAB — GLUCOSE, CAPILLARY
Glucose-Capillary: 224 mg/dL — ABNORMAL HIGH (ref 70–99)
Glucose-Capillary: 255 mg/dL — ABNORMAL HIGH (ref 70–99)
Glucose-Capillary: 299 mg/dL — ABNORMAL HIGH (ref 70–99)
Glucose-Capillary: 213 mg/dL — ABNORMAL HIGH (ref 70–99)

## 2011-12-27 MED ORDER — HYDROCHLOROTHIAZIDE 12.5 MG PO CAPS
12.5000 mg | ORAL_CAPSULE | Freq: Every day | ORAL | Status: DC
  Administered 2011-12-27 – 2011-12-29 (×3): 12.5 mg via ORAL
  Filled 2011-12-27 (×3): qty 1

## 2011-12-27 MED ORDER — INSULIN GLARGINE 100 UNIT/ML ~~LOC~~ SOLN
60.0000 [IU] | Freq: Every day | SUBCUTANEOUS | Status: DC
  Administered 2011-12-27 – 2011-12-28 (×2): 60 [IU] via SUBCUTANEOUS

## 2011-12-27 NOTE — Progress Notes (Signed)
Noted her fingers on rt.hand are more swollen & sl.discolored .fingers warm to touch & mobile.;Elevated on pillow,& called the ortho.MD on call.Endorsed to am nurse to follow it up.

## 2011-12-27 NOTE — Progress Notes (Signed)
[facility] PROGRESS NOTE  Katie Dawson:[id number] DOB: [birth year] DOA: 12/24/2011   Assessment/Plan: Patient Active Hospital Problem List: UTI (lower urinary tract infection) (12/24/2011) -D/c Rocephin. Has remained afebrile leukocytosis improving. -Urine cultures <2,000 colonies.  DIABETES MELLITUS, TYPE II (04/22/2007) -blood glucose continues to be high, increase her glipizide. -Increase Lantus.  HYPERTENSION (04/22/2007) -Blood pressure continues to improve but high, monitor.  -cont hydrochlorothiazide, clonidine, Norvasc, benazepril and Imdur. HCTZ can take up to 2 weeks to see effect. -basic metabolic panel in am.  RENAL INSUFFICIENCY (04/22/2007) -continues to be at baseline. It is to note that she's had a previous nephrectomy for a cancerous lesion.  Closed fibular fracture (12/24/2011) -Ortho consulted they recommended conservative management. -Nonweightbearing status for one month. -We'll need skilled nursing facility  Wrist fracture, closed (12/24/2011) -close reduction in the ED followup with Dr. Liz Malady as an outpatient in 2 weeks. -Increase narcotics , will add IV medications when necessary.    Code Status: Full code Family Communication: Other [phone] Disposition Plan: SNF     LOS: 3 days   Procedures:  Closed reduction  Antibiotics:  Rocephin 12/23/2028    Subjective: She continues to have pain in her left wrist. Tolerating her diet, however was no further complaints.  Objective: Filed Vitals:   12/26/11 1006 12/26/11 2210 12/26/11 2330 12/27/11 0626  BP: 165/82 184/68 166/73 155/73  Pulse:  90  73  Temp:  99 F (37.2 C)  99.5 F (37.5 C)  TempSrc:  Oral  Oral  Resp:  18  18  Height:      Weight:    125.4 kg (276 lb 7.3 oz)  SpO2:  93%  94%    Intake/Output Summary (Last 24 hours) at 12/27/11 1140 Last data filed at 12/27/11 0900  Gross per 24 hour  Intake 1389.33 ml  Output      0 ml  Net 1389.33 ml    Weight change:   Exam:  General: Alert, awake, oriented x3, in no acute distress.  Heart: Regular rate and rhythm, without murmurs, rubs, gallops.  Lungs: Good air movement, good air movement Abdomen: Soft, nontender, nondistended, positive bowel sounds.  Extremities: warm to touch, bruise. Able to make a fist good capillary refill.   Data Reviewed: Basic Metabolic Panel:  Lab 12/25/11 5621 12/24/11 1740  NA 142 141  K 3.4* 3.7  CL 104 102  CO2 27 26  GLUCOSE 323* 372*  BUN 17 14  CREATININE 0.90 0.76  CALCIUM 9.6 9.6  MG -- --  PHOS -- --   Liver Function Tests:  Lab 12/25/11 0526 12/24/11 1740  AST 10 14  ALT 13 16  ALKPHOS 82 93  BILITOT 0.4 0.5  PROT 6.7 7.3  ALBUMIN 2.9* 3.2*   No results found for this basename: LIPASE:5,AMYLASE:5 in the last 168 hours No results found for this basename: AMMONIA:5 in the last 168 hours CBC:  Lab 12/25/11 0526 12/24/11 1740  WBC 12.6* 13.8*  NEUTROABS -- 12.2*  HGB 12.4 13.3  HCT 38.3 40.5  MCV 88.9 88.0  PLT 263 261   Cardiac Enzymes: No results found for this basename: CKTOTAL:5,CKMB:5,CKMBINDEX:5,TROPONINI:5 in the last 168 hours BNP: No components found with this basename: POCBNP:5 CBG:  Lab 12/27/11 0801 12/26/11 2205 12/26/11 1656 12/26/11 1127 12/26/11 0801  GLUCAP 224* 253* 246* 267* 256*    Recent Results (from the past 240 hour(s))  URINE CULTURE     Status: Normal   Collection Time   12/25/11 10:17  AM      Component Value Range Status Comment   Specimen Description URINE, CLEAN CATCH   Final    Special Requests NONE   Final    Culture  Setup Time 12/25/2011 10:34   Final    Colony Count 2,000 COLONIES/ML   Final    Culture INSIGNIFICANT GROWTH   Final    Report Status 12/26/2011 FINAL   Final      Studies: Dg Wrist Complete Right  12/24/2011  *RADIOLOGY REPORT*  Clinical Data: Fall, wrist deformity.  RIGHT WRIST - COMPLETE 3+ VIEW  Comparison: None.  Findings: There is a comminuted fracture  of the distal radius which extends to the joint space. A long segment oblique fracture extends more proximally toward the radial shaft.   Mild dorsal angulation. Moderate displacement of the distal radius and carpal bones away from the ulna.  Ulnar styloid intact.  No carpal bone fracture.  IMPRESSION: Distal radius fracture as described.  Original Report Authenticated By: Elsie Stain, M.D.   Ct Head Wo Contrast  12/24/2011  *RADIOLOGY REPORT*  Clinical Data:  Fall.  Trauma to the head and neck.  CT HEAD WITHOUT CONTRAST CT CERVICAL SPINE WITHOUT CONTRAST  Technique:  Multidetector CT imaging of the head and cervical spine was performed following the standard protocol without intravenous contrast.  Multiplanar CT image reconstructions of the cervical spine were also generated.  Comparison:  03/30/2009  CT HEAD  Findings: The brain shows age related atrophy with chronic appearing small vessel changes affecting the hemispheric deep white matter.  No sign of acute infarction, mass lesion, hemorrhage, hydrocephalus or extra-axial collection.  No skull fracture.  No fluid in the sinuses.  There is right frontal scalp swelling. Atherosclerotic calcification effects major vessels at the base of the brain.  IMPRESSION: Age related atrophy and chronic small vessel change.  No acute intracranial finding.  Right frontal scalp swelling.  No skull fracture.  CT CERVICAL SPINE  Findings: No fracture or traumatic malalignment.  No soft tissue swelling.  There is ordinary degenerative arthritis at the C1-2 level.  There are shallow central disc protrusions at C2-3 and C3- 4.  C4-5 shows spondylosis with endplate osteophytes and shallow protrusion of disc material.  The C5-6 shows pronounced chronic spondylosis with endplate osteophytes more prominent on the right than the left.  This narrows the right side of the canal and both neural foramina right more than left.  IMPRESSION: No acute or traumatic finding.  Degenerative  changes as above with spinal stenosis because of osteophytes at the C5-6 level worse on the right.  Original Report Authenticated By: Thomasenia Sales, M.D.   Ct Cervical Spine Wo Contrast  12/24/2011  *RADIOLOGY REPORT*  Clinical Data:  Fall.  Trauma to the head and neck.  CT HEAD WITHOUT CONTRAST CT CERVICAL SPINE WITHOUT CONTRAST  Technique:  Multidetector CT imaging of the head and cervical spine was performed following the standard protocol without intravenous contrast.  Multiplanar CT image reconstructions of the cervical spine were also generated.  Comparison:  03/30/2009  CT HEAD  Findings: The brain shows age related atrophy with chronic appearing small vessel changes affecting the hemispheric deep white matter.  No sign of acute infarction, mass lesion, hemorrhage, hydrocephalus or extra-axial collection.  No skull fracture.  No fluid in the sinuses.  There is right frontal scalp swelling. Atherosclerotic calcification effects major vessels at the base of the brain.  IMPRESSION: Age related atrophy and chronic small vessel change.  No acute intracranial finding.  Right frontal scalp swelling.  No skull fracture.  CT CERVICAL SPINE  Findings: No fracture or traumatic malalignment.  No soft tissue swelling.  There is ordinary degenerative arthritis at the C1-2 level.  There are shallow central disc protrusions at C2-3 and C3- 4.  C4-5 shows spondylosis with endplate osteophytes and shallow protrusion of disc material.  The C5-6 shows pronounced chronic spondylosis with endplate osteophytes more prominent on the right than the left.  This narrows the right side of the canal and both neural foramina right more than left.  IMPRESSION: No acute or traumatic finding.  Degenerative changes as above with spinal stenosis because of osteophytes at the C5-6 level worse on the right.  Original Report Authenticated By: Thomasenia Sales, M.D.   Dg Knee Complete 4 Views Right  12/24/2011  *RADIOLOGY REPORT*  Clinical  Data: Fall.  Swelling.  RIGHT KNEE - COMPLETE 4+ VIEW  Comparison: None.  Findings: Fibular head fracture.  Sclerotic appearance of the right lateral femoral condyle may represent result of osteonecrosis or bony infarct.  There is slight incongruity of the articular surface which may be related to superimposed degenerative changes although difficult to exclude subtle impaction fracture of the right lateral femoral condyle.  Tricompartment degenerative changes.  Small suprapatellar joint effusion.  IMPRESSION: Fibular head fracture.  Sclerotic appearance of the right lateral femoral condyle may represent result of osteonecrosis or bony infarct.  There is slight incongruity of the articular surface which may be related to superimposed degenerative changes although difficult to exclude subtle impaction fracture of the right lateral femoral condyle.  Tricompartment degenerative changes.  Small suprapatellar joint effusion.  Original Report Authenticated By: Fuller Canada, M.D.    Scheduled Meds:    . amLODipine  10 mg Oral Daily  . antiseptic oral rinse  15 mL Mouth Rinse BID  . benazepril  40 mg Oral BID  . cefTRIAXone (ROCEPHIN)  IV  1 g Intravenous Q24H  . cloNIDine  0.1 mg Oral BID  . glipiZIDE  10 mg Oral Q breakfast  . heparin  5,000 Units Subcutaneous Q8H  . hydrochlorothiazide  12.5 mg Oral Daily  . insulin aspart  0-15 Units Subcutaneous TID WC  . insulin glargine  40 Units Subcutaneous QHS  . isosorbide mononitrate  120 mg Oral QHS  . potassium chloride SA  20 mEq Oral Daily  . simvastatin  20 mg Oral QPM  . DISCONTD: glipiZIDE  5 mg Oral Q breakfast  . DISCONTD: hydrochlorothiazide  12.5 mg Oral Daily  . DISCONTD: insulin glargine  20 Units Subcutaneous QHS   Continuous Infusions:    . sodium chloride 20 mL/hr at 12/25/11 0957    Lambert Keto, MD  [facility] Pager [phone]  If 7PM-7AM, please contact night-coverage [email or web address] Password [email or web address] 12/27/2011,  11:40 AM

## 2011-12-28 DIAGNOSIS — M79609 Pain in unspecified limb: Secondary | ICD-10-CM

## 2011-12-28 LAB — BASIC METABOLIC PANEL
CO2: 26 mEq/L (ref 19–32)
Calcium: 9.4 mg/dL (ref 8.4–10.5)
GFR calc non Af Amer: 54 mL/min — ABNORMAL LOW (ref >90)
Potassium: 3.9 mEq/L (ref 3.5–5.1)
Sodium: 142 mEq/L (ref 135–145)

## 2011-12-28 LAB — CBC
MCH: 29 pg (ref 26.0–34.0)
MCHC: 32.3 g/dL (ref 30.0–36.0)
Platelets: 289 10*3/uL (ref 150–400)
RBC: 3.66 MIL/uL — ABNORMAL LOW (ref 3.87–5.11)

## 2011-12-28 LAB — GLUCOSE, CAPILLARY: Glucose-Capillary: 116 mg/dL — ABNORMAL HIGH (ref 70–99)

## 2011-12-28 MED ORDER — HYDROCHLOROTHIAZIDE 12.5 MG PO CAPS: 12.5000 mg | ORAL_CAPSULE | Freq: Every day | ORAL | Status: DC

## 2011-12-28 MED ORDER — METFORMIN HCL 500 MG PO TABS: 500.0000 mg | ORAL_TABLET | Freq: Two times a day (BID) | ORAL | Status: DC

## 2011-12-28 MED ORDER — INSULIN GLARGINE 100 UNIT/ML ~~LOC~~ SOLN: 60.0000 [IU] | Freq: Every day | SUBCUTANEOUS | Status: DC

## 2011-12-28 MED ORDER — HYDROCODONE-ACETAMINOPHEN 5-325 MG PO TABS: 1.0000 | ORAL_TABLET | ORAL | Status: AC | PRN

## 2011-12-28 NOTE — Progress Notes (Signed)
[facility] PROGRESS NOTE  Katie Dawson AVW:[id number] DOB: 1933/12/19 DOA: 12/24/2011   Assessment/Plan: Patient Active Hospital Problem List: Closed fibular fracture (12/24/2011) -Ortho consulted they recommended conservative management. -Nonweightbearing status for one month. -We'll need skilled nursing facility  Wrist fracture, closed (12/24/2011) -close reduction in the ED followup with Dr. Liz Malady as an outpatient in 2 weeks. -Pain slightly control better than yesterday with narcotics.   DIABETES MELLITUS, TYPE II (04/22/2007) -blood glucose continues to be high, increase her glipizide. -Increase Lantus.  HYPERTENSION (04/22/2007) -Blood pressure continues to improve but high, monitor.  -cont hydrochlorothiazide, clonidine, Norvasc, benazepril and Imdur. HCTZ can take up to 2 weeks to see effect. -basic metabolic panel shows no electrolyte imbalance the  RENAL INSUFFICIENCY (04/22/2007) -continues to be at baseline. It is to note that she's had a previous nephrectomy for a cancerous lesion.    Code Status: Full code Family Communication: Other [phone] Disposition Plan: SNF     LOS: 4 days   Procedures:  Closed reduction  Antibiotics:  Rocephin 12/23/2028    Subjective: She continues to have pain in her left wrist. Complaining of left wrist and hand swelling   Objective: Filed Vitals:   12/27/11 0626 12/27/11 1358 12/27/11 2144 12/28/11 0526  BP: 155/73 169/77 170/75 151/71  Pulse: 73 78 75 85  Temp: 99.5 F (37.5 C) 98.2 F (36.8 C) 98.3 F (36.8 C) 98.5 F (36.9 C)  TempSrc: Oral Oral Oral Oral  Resp: 18 18 18 18   Height:      Weight: 125.4 kg (276 lb 7.3 oz)     SpO2: 94% 95% 96% 91%    Intake/Output Summary (Last 24 hours) at 12/28/11 0956 Last data filed at 12/28/11 0600  Gross per 24 hour  Intake 950.67 ml  Output      0 ml  Net 950.67 ml   Weight change:   Exam:  General: Alert, awake, oriented x3, in no acute  distress.  Heart: Regular rate and rhythm, without murmurs, rubs, gallops.  Lungs: Good air movement, good air movement Abdomen: Soft, nontender, nondistended, positive bowel sounds.  Extremities: warm to touch, bruise. Able to make a fist good capillary refill.   Data Reviewed: Basic Metabolic Panel:  Lab 12/28/11 6578 12/25/11 0526 12/24/11 1740  NA 142 142 141  K 3.9 3.4* --  CL 106 104 102  CO2 26 27 26   GLUCOSE 126* 323* 372*  BUN 17 17 14   CREATININE 0.99 0.90 0.76  CALCIUM 9.4 9.6 9.6  MG -- -- --  PHOS -- -- --   Liver Function Tests:  Lab 12/25/11 0526 12/24/11 1740  AST 10 14  ALT 13 16  ALKPHOS 82 93  BILITOT 0.4 0.5  PROT 6.7 7.3  ALBUMIN 2.9* 3.2*   No results found for this basename: LIPASE:5,AMYLASE:5 in the last 168 hours No results found for this basename: AMMONIA:5 in the last 168 hours CBC:  Lab 12/28/11 0535 12/25/11 0526 12/24/11 1740  WBC 11.8* 12.6* 13.8*  NEUTROABS -- -- 12.2*  HGB 10.6* 12.4 13.3  HCT 32.8* 38.3 40.5  MCV 89.6 88.9 88.0  PLT 289 263 261   Cardiac Enzymes: No results found for this basename: CKTOTAL:5,CKMB:5,CKMBINDEX:5,TROPONINI:5 in the last 168 hours BNP: No components found with this basename: POCBNP:5 CBG:  Lab 12/28/11 0754 12/27/11 2134 12/27/11 1630 12/27/11 1207 12/27/11 0801  GLUCAP 116* 213* 299* 255* 224*    Recent Results (from the past 240 hour(s))  URINE CULTURE  Status: Normal   Collection Time   12/25/11 10:17 AM      Component Value Range Status Comment   Specimen Description URINE, CLEAN CATCH   Final    Special Requests NONE   Final    Culture  Setup Time 12/25/2011 10:34   Final    Colony Count 2,000 COLONIES/ML   Final    Culture INSIGNIFICANT GROWTH   Final    Report Status 12/26/2011 FINAL   Final      Studies: Dg Wrist Complete Right  12/24/2011  *RADIOLOGY REPORT*  Clinical Data: Fall, wrist deformity.  RIGHT WRIST - COMPLETE 3+ VIEW  Comparison: None.  Findings: There is a  comminuted fracture of the distal radius which extends to the joint space. A long segment oblique fracture extends more proximally toward the radial shaft.   Mild dorsal angulation. Moderate displacement of the distal radius and carpal bones away from the ulna.  Ulnar styloid intact.  No carpal bone fracture.  IMPRESSION: Distal radius fracture as described.  Original Report Authenticated By: Elsie Stain, M.D.   Ct Head Wo Contrast  12/24/2011  *RADIOLOGY REPORT*  Clinical Data:  Fall.  Trauma to the head and neck.  CT HEAD WITHOUT CONTRAST CT CERVICAL SPINE WITHOUT CONTRAST  Technique:  Multidetector CT imaging of the head and cervical spine was performed following the standard protocol without intravenous contrast.  Multiplanar CT image reconstructions of the cervical spine were also generated.  Comparison:  03/30/2009  CT HEAD  Findings: The brain shows age related atrophy with chronic appearing small vessel changes affecting the hemispheric deep white matter.  No sign of acute infarction, mass lesion, hemorrhage, hydrocephalus or extra-axial collection.  No skull fracture.  No fluid in the sinuses.  There is right frontal scalp swelling. Atherosclerotic calcification effects major vessels at the base of the brain.  IMPRESSION: Age related atrophy and chronic small vessel change.  No acute intracranial finding.  Right frontal scalp swelling.  No skull fracture.  CT CERVICAL SPINE  Findings: No fracture or traumatic malalignment.  No soft tissue swelling.  There is ordinary degenerative arthritis at the C1-2 level.  There are shallow central disc protrusions at C2-3 and C3- 4.  C4-5 shows spondylosis with endplate osteophytes and shallow protrusion of disc material.  The C5-6 shows pronounced chronic spondylosis with endplate osteophytes more prominent on the right than the left.  This narrows the right side of the canal and both neural foramina right more than left.  IMPRESSION: No acute or traumatic  finding.  Degenerative changes as above with spinal stenosis because of osteophytes at the C5-6 level worse on the right.  Original Report Authenticated By: Thomasenia Sales, M.D.   Ct Cervical Spine Wo Contrast  12/24/2011  *RADIOLOGY REPORT*  Clinical Data:  Fall.  Trauma to the head and neck.  CT HEAD WITHOUT CONTRAST CT CERVICAL SPINE WITHOUT CONTRAST  Technique:  Multidetector CT imaging of the head and cervical spine was performed following the standard protocol without intravenous contrast.  Multiplanar CT image reconstructions of the cervical spine were also generated.  Comparison:  03/30/2009  CT HEAD  Findings: The brain shows age related atrophy with chronic appearing small vessel changes affecting the hemispheric deep white matter.  No sign of acute infarction, mass lesion, hemorrhage, hydrocephalus or extra-axial collection.  No skull fracture.  No fluid in the sinuses.  There is right frontal scalp swelling. Atherosclerotic calcification effects major vessels at the base of the brain.  IMPRESSION: Age related atrophy and chronic small vessel change.  No acute intracranial finding.  Right frontal scalp swelling.  No skull fracture.  CT CERVICAL SPINE  Findings: No fracture or traumatic malalignment.  No soft tissue swelling.  There is ordinary degenerative arthritis at the C1-2 level.  There are shallow central disc protrusions at C2-3 and C3- 4.  C4-5 shows spondylosis with endplate osteophytes and shallow protrusion of disc material.  The C5-6 shows pronounced chronic spondylosis with endplate osteophytes more prominent on the right than the left.  This narrows the right side of the canal and both neural foramina right more than left.  IMPRESSION: No acute or traumatic finding.  Degenerative changes as above with spinal stenosis because of osteophytes at the C5-6 level worse on the right.  Original Report Authenticated By: Thomasenia Sales, M.D.   Dg Knee Complete 4 Views Right  12/24/2011   *RADIOLOGY REPORT*  Clinical Data: Fall.  Swelling.  RIGHT KNEE - COMPLETE 4+ VIEW  Comparison: None.  Findings: Fibular head fracture.  Sclerotic appearance of the right lateral femoral condyle may represent result of osteonecrosis or bony infarct.  There is slight incongruity of the articular surface which may be related to superimposed degenerative changes although difficult to exclude subtle impaction fracture of the right lateral femoral condyle.  Tricompartment degenerative changes.  Small suprapatellar joint effusion.  IMPRESSION: Fibular head fracture.  Sclerotic appearance of the right lateral femoral condyle may represent result of osteonecrosis or bony infarct.  There is slight incongruity of the articular surface which may be related to superimposed degenerative changes although difficult to exclude subtle impaction fracture of the right lateral femoral condyle.  Tricompartment degenerative changes.  Small suprapatellar joint effusion.  Original Report Authenticated By: Fuller Canada, M.D.    Scheduled Meds:    . amLODipine  10 mg Oral Daily  . antiseptic oral rinse  15 mL Mouth Rinse BID  . benazepril  40 mg Oral BID  . cloNIDine  0.1 mg Oral BID  . glipiZIDE  10 mg Oral Q breakfast  . heparin  5,000 Units Subcutaneous Q8H  . hydrochlorothiazide  12.5 mg Oral Daily  . insulin aspart  0-15 Units Subcutaneous TID WC  . insulin glargine  60 Units Subcutaneous QHS  . isosorbide mononitrate  120 mg Oral QHS  . potassium chloride SA  20 mEq Oral Daily  . simvastatin  20 mg Oral QPM  . DISCONTD: cefTRIAXone (ROCEPHIN)  IV  1 g Intravenous Q24H  . DISCONTD: hydrochlorothiazide  12.5 mg Oral Daily  . DISCONTD: insulin glargine  40 Units Subcutaneous QHS   Continuous Infusions:    . sodium chloride 20 mL (12/27/11 1345)    Lambert Keto, MD  [facility] Pager [phone]  If 7PM-7AM, please contact night-coverage [email or web address] Password [facility] 12/28/2011, 9:56  AM

## 2011-12-28 NOTE — Discharge Summary (Addendum)
Physician Discharge Summary  Katie Dawson UJW:[id number] DOB: [birth year] DOA: 12/24/2011  PCP: Oliver Barre, MD  Admit date: 12/24/2011 Discharge date: 12/29/2011  Discharge Diagnoses:  Principal Problem:  *Wrist fracture, closed Active Problems:  DIABETES MELLITUS, TYPE II  ANEMIA-IRON DEFICIENCY  HYPERTENSION  RENAL INSUFFICIENCY  Closed fibular fracture  Hypertension   Discharge Condition: Stable for DC  Disposition:  Follow-up Information    Follow up with Oliver Barre, MD in 2 weeks. [facility] follow)    Contact information:   [address] [facility] [address] [place] [address] [place] [address] [phone]       Follow up with Toni Arthurs, MD in 3 weeks. (Further evaluation of fractures)    Contact information:   [address] [place] [address] [phone]          Diet: Low-carb modified diet Wt Readings from Last 3 Encounters:  12/27/11 125.4 kg (276 lb 7.3 oz)  11/05/10 133.925 kg (295 lb 4 oz)  04/20/09 132.45 kg (292 lb)    History of present illness:  78 year old female with past medical history of hypertension, diabetes mellitus status post nephrectomy appropriate answers liver lesion and no chemotherapy that comes in for fall. She relates she has been having dysuria more frequently than usual, she woke up this morning try to go to the bathroom but became incontinent and slipped and fell in her legs, she also hit her head. She relates she has been slightly dizzy upon standing, but she continued to take her blood pressure medication. She relates she feels like she is always thirsty. She does not lose consciousness during this time. He does relate that after she fell she started having aches in her wrist and her left leg. She relates no back pain.   Hospital Course:  Principal Problem:  *Wrist fracture, closed: -She was evaluated by ortho hand surgeon -close reduction in the ED followup with Dr. Deirdre Pippins as an  outpatient in 2 weeks.  -Pain slightly control better than yesterday with narcotics.   Closed fibular fracture (12/24/2011) -Ortho consulted they recommended conservative management.  -Nonweightbearing status for one month.  -We'll need skilled nursing facility  DIABETES MELLITUS, TYPE II That  was only on glipizide here she was started on Lantus and increase it to 60 units plus sliding and her blood sugars remain controlled ranging from 213-116 she will need further titration of her Lantus as an outpatient. Also metformin was added for better blood glucose control. Her hemoglobin A1c at the time of admission was 11.8 and followup with her primary care Dr. as an outpatient and titrate medications as tolerated   ANEMIA-IRON DEFICIENCY -Remained stable no changes were made.  HYPERTENSION -Her blood pressure was high on admission current home dose of medications. She was started on a diuretic as she was not a diuretic at home. Her home medications were maxed out and her blood pressure started trending to go down. We will reevaluate in 2 weeks when her hydrochlorothiazide has taken full effect on her blood pressure and followup with her primary care doctor and titrate blood pressure medications as tolerated as an outpatient.   RENAL INSUFFICIENCY  -Stable no changes were made.  Discharge Exam: Filed Vitals:   12/28/11 0526  BP: 151/71  Pulse: 85  Temp: 98.5 F (36.9 C)  Resp: 18   Filed Vitals:   12/27/11 0626 12/27/11 1358 12/27/11 2144 12/28/11 0526  BP: 155/73 169/77 170/75 151/71  Pulse: 73 78 75  85  Temp: 99.5 F (37.5 C) 98.2 F (36.8 C) 98.3 F (36.8 C) 98.5 F (36.9 C)  TempSrc: Oral Oral Oral Oral  Resp: 18 18 18 18   Height:      Weight: 125.4 kg (276 lb 7.3 oz)     SpO2: 94% 95% 96% 91%   General: Awake alert oriented x3 no acute distress Cardiovascular: Regular rate and rhythm with positive S1 and S2 Respiratory: Air movement clear to auscultation  Discharge  Instructions  Discharge Orders    Future Orders Please Complete By Expires   Diet - low sodium heart healthy      Increase activity slowly        Medication List  As of 12/28/2011 10:02 AM   TAKE these medications         amLODipine 10 MG tablet   Commonly known as: NORVASC   Take 10 mg by mouth daily.      benazepril 40 MG tablet   Commonly known as: LOTENSIN   Take 1 tablet (40 mg total) by mouth 2 (two) times daily.      cloNIDine 0.1 MG tablet   Commonly known as: CATAPRES   TAKE 1 TABLET TWICE DAILY.      GLUCOTROL XL 10 MG 24 hr tablet   Generic drug: glipiZIDE   TAKE (2) TABLETS DAILY.      hydrochlorothiazide 12.5 MG capsule   Commonly known as: MICROZIDE   Take 1 capsule (12.5 mg total) by mouth daily.      HYDROcodone-acetaminophen 5-325 MG per tablet   Commonly known as: NORCO/VICODIN   Take 1-2 tablets by mouth every 4 (four) hours as needed.      insulin glargine 100 UNIT/ML injection   Commonly known as: LANTUS   Inject 60 Units into the skin at bedtime.      Iron 325 (65 FE) MG Tabs   Take by mouth daily.      isosorbide mononitrate 60 MG 24 hr tablet   Commonly known as: IMDUR   Take 60 mg by mouth at bedtime.      labetalol 200 MG tablet   Commonly known as: NORMODYNE   TAKE 1 TABLET TWICE DAILY.      LORazepam 1 MG tablet   Commonly known as: ATIVAN   Take 1 tablet (1 mg total) by mouth 2 (two) times daily as needed for anxiety.      metFORMIN 500 MG tablet   Commonly known as: GLUCOPHAGE   Take 1 tablet (500 mg total) by mouth 2 (two) times daily with a meal.      potassium chloride SA 20 MEQ tablet   Commonly known as: K-DUR,KLOR-CON   Take 20 mEq by mouth daily.      simvastatin 20 MG tablet   Commonly known as: ZOCOR   Take 20 mg by mouth every evening.              The results of significant diagnostics from this hospitalization (including imaging, microbiology, ancillary and laboratory) are listed below for reference.     Significant Diagnostic Studies: Dg Wrist Complete Right  12/24/2011  *RADIOLOGY REPORT*  Clinical Data: Fall, wrist deformity.  RIGHT WRIST - COMPLETE 3+ VIEW  Comparison: None.  Findings: There is a comminuted fracture of the distal radius which extends to the joint space. A long segment oblique fracture extends more proximally toward the radial shaft.   Mild dorsal angulation. Moderate displacement of the distal radius and carpal bones  away from the ulna.  Ulnar styloid intact.  No carpal bone fracture.  IMPRESSION: Distal radius fracture as described.  Original Report Authenticated By: Elsie Stain, M.D.   Dg Ankle Complete Right  12/25/2011  *RADIOLOGY REPORT*  Clinical Data: Fall with pain and swelling in the entire ankle.  RIGHT ANKLE - COMPLETE 3+ VIEW  Comparison: Right foot report from 11/06/2002.  Images are not available.  Findings: Three views of the ankle demonstrate a calcaneal spurring along the plantar aspect and along the Achilles tendon insertion. There is marked soft tissue swelling, particularly along the lateral aspect.  There is a minimally-displaced fracture involving the distal fibula. This is an oblique type fracture that extends to the level of the ankle joint.  The ankle is located.  IMPRESSION: Minimally-displaced fracture of the distal fibula.  These results will be called to the ordering clinician or representative by the Radiologist Assistant, and communication documented in the PACS Dashboard.  Original Report Authenticated By: Richarda Overlie, M.D.   Ct Head Wo Contrast  12/24/2011  *RADIOLOGY REPORT*  Clinical Data:  Fall.  Trauma to the head and neck.  CT HEAD WITHOUT CONTRAST CT CERVICAL SPINE WITHOUT CONTRAST  Technique:  Multidetector CT imaging of the head and cervical spine was performed following the standard protocol without intravenous contrast.  Multiplanar CT image reconstructions of the cervical spine were also generated.  Comparison:  03/30/2009  CT HEAD   Findings: The brain shows age related atrophy with chronic appearing small vessel changes affecting the hemispheric deep white matter.  No sign of acute infarction, mass lesion, hemorrhage, hydrocephalus or extra-axial collection.  No skull fracture.  No fluid in the sinuses.  There is right frontal scalp swelling. Atherosclerotic calcification effects major vessels at the base of the brain.  IMPRESSION: Age related atrophy and chronic small vessel change.  No acute intracranial finding.  Right frontal scalp swelling.  No skull fracture.  CT CERVICAL SPINE  Findings: No fracture or traumatic malalignment.  No soft tissue swelling.  There is ordinary degenerative arthritis at the C1-2 level.  There are shallow central disc protrusions at C2-3 and C3- 4.  C4-5 shows spondylosis with endplate osteophytes and shallow protrusion of disc material.  The C5-6 shows pronounced chronic spondylosis with endplate osteophytes more prominent on the right than the left.  This narrows the right side of the canal and both neural foramina right more than left.  IMPRESSION: No acute or traumatic finding.  Degenerative changes as above with spinal stenosis because of osteophytes at the C5-6 level worse on the right.  Original Report Authenticated By: Thomasenia Sales, M.D.   Ct Cervical Spine Wo Contrast  12/24/2011  *RADIOLOGY REPORT*  Clinical Data:  Fall.  Trauma to the head and neck.  CT HEAD WITHOUT CONTRAST CT CERVICAL SPINE WITHOUT CONTRAST  Technique:  Multidetector CT imaging of the head and cervical spine was performed following the standard protocol without intravenous contrast.  Multiplanar CT image reconstructions of the cervical spine were also generated.  Comparison:  03/30/2009  CT HEAD  Findings: The brain shows age related atrophy with chronic appearing small vessel changes affecting the hemispheric deep white matter.  No sign of acute infarction, mass lesion, hemorrhage, hydrocephalus or extra-axial collection.  No  skull fracture.  No fluid in the sinuses.  There is right frontal scalp swelling. Atherosclerotic calcification effects major vessels at the base of the brain.  IMPRESSION: Age related atrophy and chronic small vessel change.  No  acute intracranial finding.  Right frontal scalp swelling.  No skull fracture.  CT CERVICAL SPINE  Findings: No fracture or traumatic malalignment.  No soft tissue swelling.  There is ordinary degenerative arthritis at the C1-2 level.  There are shallow central disc protrusions at C2-3 and C3- 4.  C4-5 shows spondylosis with endplate osteophytes and shallow protrusion of disc material.  The C5-6 shows pronounced chronic spondylosis with endplate osteophytes more prominent on the right than the left.  This narrows the right side of the canal and both neural foramina right more than left.  IMPRESSION: No acute or traumatic finding.  Degenerative changes as above with spinal stenosis because of osteophytes at the C5-6 level worse on the right.  Original Report Authenticated By: Thomasenia Sales, M.D.   Dg Knee Complete 4 Views Right  12/24/2011  *RADIOLOGY REPORT*  Clinical Data: Fall.  Swelling.  RIGHT KNEE - COMPLETE 4+ VIEW  Comparison: None.  Findings: Fibular head fracture.  Sclerotic appearance of the right lateral femoral condyle may represent result of osteonecrosis or bony infarct.  There is slight incongruity of the articular surface which may be related to superimposed degenerative changes although difficult to exclude subtle impaction fracture of the right lateral femoral condyle.  Tricompartment degenerative changes.  Small suprapatellar joint effusion.  IMPRESSION: Fibular head fracture.  Sclerotic appearance of the right lateral femoral condyle may represent result of osteonecrosis or bony infarct.  There is slight incongruity of the articular surface which may be related to superimposed degenerative changes although difficult to exclude subtle impaction fracture of the right  lateral femoral condyle.  Tricompartment degenerative changes.  Small suprapatellar joint effusion.  Original Report Authenticated By: Fuller Canada, M.D.   Dg Foot 2 Views Left  12/26/2011  *RADIOLOGY REPORT*  Clinical Data: Foot painful to touch.  LEFT FOOT - 2 VIEW  Comparison: None.  Findings: There is no evidence of fracture or dislocation.  There is no evidence of arthropathy or other focal bony abnormality. Soft tissues are unremarkable.  IMPRESSION: No fracture or erosive arthropathy.  Original Report Authenticated By: Elsie Stain, M.D.    Microbiology: Recent Results (from the past 240 hour(s))  URINE CULTURE     Status: Normal   Collection Time   12/25/11 10:17 AM      Component Value Range Status Comment   Specimen Description URINE, CLEAN CATCH   Final    Special Requests NONE   Final    Culture  Setup Time 12/25/2011 10:34   Final    Colony Count 2,000 COLONIES/ML   Final    Culture INSIGNIFICANT GROWTH   Final    Report Status 12/26/2011 FINAL   Final      Labs: Basic Metabolic Panel:  Lab 12/28/11 1610 12/25/11 0526 12/24/11 1740  NA 142 142 141  K 3.9 3.4* --  CL 106 104 102  CO2 26 27 26   GLUCOSE 126* 323* 372*  BUN 17 17 14   CREATININE 0.99 0.90 0.76  CALCIUM 9.4 9.6 9.6  MG -- -- --  PHOS -- -- --   Liver Function Tests:  Lab 12/25/11 0526 12/24/11 1740  AST 10 14  ALT 13 16  ALKPHOS 82 93  BILITOT 0.4 0.5  PROT 6.7 7.3  ALBUMIN 2.9* 3.2*   No results found for this basename: LIPASE:5,AMYLASE:5 in the last 168 hours No results found for this basename: AMMONIA:5 in the last 168 hours CBC:  Lab 12/28/11 0535 12/25/11 0526 12/24/11 1740  WBC 11.8* 12.6* 13.8*  NEUTROABS -- -- 12.2*  HGB 10.6* 12.4 13.3  HCT 32.8* 38.3 40.5  MCV 89.6 88.9 88.0  PLT 289 263 261   Cardiac Enzymes: No results found for this basename: CKTOTAL:5,CKMB:5,CKMBINDEX:5,TROPONINI:5 in the last 168 hours BNP: No components found with this basename:  POCBNP:5 CBG:  Lab 12/28/11 0754 12/27/11 2134 12/27/11 1630 12/27/11 1207 12/27/11 0801  GLUCAP 116* 213* 299* 255* 224*    Time coordinating discharge: Greater than 35 minutes  Signed:  Marinda Elk  [facility] 12/28/2011, 10:02 AM

## 2011-12-28 NOTE — Progress Notes (Signed)
Subjective: Pt c/o mild pain in right ankle. No pain in L foot.  Denies numbness, tingling or weakness in R LE.   Objective: Vital signs in last 24 hours: Temp:  [98.2 F (36.8 C)-98.5 F (36.9 C)] 98.5 F (36.9 C) (07/28 0526) Pulse Rate:  [75-85] 85  (07/28 0526) Resp:  [18] 18  (07/28 0526) BP: (151-170)/(71-77) 151/71 mmHg (07/28 0526) SpO2:  [91 %-96 %] 91 % (07/28 0526)  Intake/Output from previous day: 07/27 0701 - 07/28 0700 In: 1190.7 [P.O.:720; I.V.:470.7] Out: -  Intake/Output this shift:     Basename 12/28/11 0535  HGB 10.6*    Basename 12/28/11 0535  WBC 11.8*  RBC 3.66*  HCT 32.8*  PLT 289    Basename 12/28/11 0535  NA 142  K 3.9  CL 106  CO2 26  BUN 17  CREATININE 0.99  GLUCOSE 126*  CALCIUM 9.4   No results found for this basename: LABPT:2,INR:2 in the last 72 hours  R LE splint in place.  toes with brisk cap refill.  sens to LT intact.  PF and DF strength 5/5.  Assessment/Plan: R ankle fx.  Continue NWB on R LE.  I'll see her back in clinic in 2 weeks for cast change.  I'll sign off now.  PLease call with any questions.  [phone]   Toni Arthurs 12/28/2011, 10:38 AM

## 2011-12-28 NOTE — Progress Notes (Signed)
D/C summary noted. Insurance authorization for SNF pending, weekday CSW will follow up.   Lia Foyer, LCSWA [facility] Clinical Social Worker Contact #: [phone] (weekend)

## 2011-12-29 DIAGNOSIS — K219 Gastro-esophageal reflux disease without esophagitis: Secondary | ICD-10-CM

## 2011-12-29 LAB — GLUCOSE, CAPILLARY
Glucose-Capillary: 201 mg/dL — ABNORMAL HIGH (ref 70–99)
Glucose-Capillary: 156 mg/dL — ABNORMAL HIGH (ref 70–99)

## 2011-12-29 NOTE — Plan of Care (Signed)
Problem: Phase II Progression Outcomes Goal: Progress activity as tolerated unless otherwise ordered Outcome: Not Progressing NWB to RUE and RLE

## 2011-12-29 NOTE — Progress Notes (Signed)
Clinical Social Worker met with pt at bedside to discuss decision for SNF placement. Pt chooses bed at [facility]. Clinical Social Worker contacted facility and faxed clinical information from weekend and facility requested updated PT note to submit to [facility], but pt has not had PT treatment since Friday. Clinical Social Worker notified PT to request PT treatment this morning if possible. Clinical Social Worker contacted [facility] hospital liaison, Hortencia Pilar and left voice message. Clinical Social Worker has not yet received pt pasarr number, pasarr number is currently under nursing review. Current barriers to discharge are insurance authorization and pasarr number. Clinical Social Worker to facilitate pt discharge needs once insurance authorization and pasarr number received.  Jacklynn Lewis, MSW, LCSWA  Clinical Social Work [phone]

## 2011-12-29 NOTE — Progress Notes (Signed)
Katie Dawson to be D/C'd Rehab per MD order.  Discussed with the patient and all questions fully answered.   [name], [name]  Home Medication Instructions [id number]   Printed on:12/29/11 1535  Medication Information                    amLODipine (NORVASC) 10 MG tablet Take 10 mg by mouth daily.             Ferrous Sulfate (IRON) 325 (65 FE) MG TABS Take by mouth daily.             isosorbide mononitrate (IMDUR) 60 MG 24 hr tablet Take 60 mg by mouth at bedtime.             cloNIDine (CATAPRES) 0.1 MG tablet TAKE 1 TABLET TWICE DAILY.           LORazepam (ATIVAN) 1 MG tablet Take 1 tablet (1 mg total) by mouth 2 (two) times daily as needed for anxiety.           benazepril (LOTENSIN) 40 MG tablet Take 1 tablet (40 mg total) by mouth 2 (two) times daily.           labetalol (NORMODYNE) 200 MG tablet TAKE 1 TABLET TWICE DAILY.           GLUCOTROL XL 10 MG 24 hr tablet TAKE (2) TABLETS DAILY.           simvastatin (ZOCOR) 20 MG tablet Take 20 mg by mouth every evening.           potassium chloride SA (K-DUR,KLOR-CON) 20 MEQ tablet Take 20 mEq by mouth daily.           hydrochlorothiazide (MICROZIDE) 12.5 MG capsule Take 1 capsule (12.5 mg total) by mouth daily.           HYDROcodone-acetaminophen (NORCO/VICODIN) 5-325 MG per tablet Take 1-2 tablets by mouth every 4 (four) hours as needed.           insulin glargine (LANTUS) 100 UNIT/ML injection Inject 60 Units into the skin at bedtime.           metFORMIN (GLUCOPHAGE) 500 MG tablet Take 1 tablet (500 mg total) by mouth 2 (two) times daily with a meal.             VVS, Skin clean, dry and intact without evidence of skin break down, no evidence of skin tears noted. IV catheter discontinued intact. Site without signs and symptoms of complications. Dressing and pressure applied.  An After Visit Summary was printed and placed in packet to give to facility. Patient escorted via stretcher, and D/C to [facility] via  [facility]. Report called and given to [facility].   Kennyth Arnold D 12/29/2011 3:35 PM

## 2011-12-29 NOTE — Progress Notes (Signed)
Clinical Museum/gallery conservator number for patient to discharge to [facility]. Clinical Social Worker discussed with pt at bedside and contact pt daughter, Lamar Laundry with pt permission to discuss. Pt daughter plans to arrive at [facility] at approximately 4:15pm and per facility, this Clinical Social Worker can arrange transportation at that time.  Clinical Social Worker facilitated pt discharge needs including contacting [facility], sending pt discharge information via [facility], providing contact information to RN to call report to facility, notifying pt at bedside and pt daughter via telephone. Discharge packet provided in [facility] for ambulance transport and ambulance transport arranged for pt to discharge to [facility]. No further social work needs identified at this time. Clinical Social Worker signing off.  Jacklynn Lewis, MSW, LCSWA  Clinical Social Work [phone]

## 2011-12-29 NOTE — Progress Notes (Signed)
Physical Therapy Treatment Patient Details Name: Katie Dawson MRN: [id number] DOB: Jul 15, 1933 Today's Date: 12/29/2011 Time: 4098-1191 PT Time Calculation (min): 26 min  PT Assessment / Plan / Recommendation Comments on Treatment Session  Pt s/p fall with right wrist fx and right fibula fx progressing slowly due to pain. Pt with edema and discoloration right fingers and highly encouraged pt to move fingers and maintain elevation. Pt resistant to P.T. moving fingers and provided cueing as pt performe AAROM of right fingers and elevated on pillows end of session. Pt educated for need to assist with movement to decrease pain rather than resisting  rolling and sitting which only makes tranfers more difficult. Pt more assistive on second trial of rolling right and left than first today. Pt incontinent of urine on arrival and linens changed with pt educated for the importance of calling staff when wet due to skin breakdown potential. Pt educated for importance of mobility and encouraged to move all extremities throughout the day. Will continue to follow.     Follow Up Recommendations       Barriers to Discharge        Equipment Recommendations       Recommendations for Other Services    Frequency     Plan Discharge plan remains appropriate;Frequency remains appropriate    Precautions / Restrictions Precautions Precautions: Fall Restrictions Weight Bearing Restrictions: Yes RUE Weight Bearing: Non weight bearing RLE Weight Bearing: Non weight bearing Other Position/Activity Restrictions: 0   Pertinent Vitals/Pain 8/10 right wrist and leg at all times    Mobility  Bed Mobility Bed Mobility: Sitting - Scoot to Edge of Bed;Sit to Supine;Rolling Right;Rolling Left;Right Sidelying to Sit Rolling Right: 2: Max assist;With rail Rolling Left: 1: +1 Total assist Right Sidelying to Sit: HOB elevated;1: +2 Total assist (HOB 15 degrees) Right Sidelying to Sit: Patient Percentage: 30% Sitting -  Scoot to Edge of Bed: 3: Mod assist Sit to Supine: 1: +2 Total assist;HOB flat Sit to Supine: Patient Percentage: 30% Scooting to HOB: 1: +2 Total assist (bed in trendelenburg) Scooting to [facility]: Patient Percentage: 40% Details for Bed Mobility Assistance: cueing for use of rail to assist with rolling to Right side and assist at pelvis and trunk with max cueing to roll to left. Side to sit and return to supine pt required assist for leg positioning and to elevate trunk with going to sit. Scooting to [facility] pt assisted with rail and LLE Transfers Transfers: Not assessed Ambulation/Gait Ambulation/Gait Assistance: Not tested (comment)    Exercises General Exercises - Lower Extremity Short Arc Quad: 10 reps;Right;Left;AROM;AAROM;Seated (AAROM on RLE) Hip Flexion/Marching: AROM;AAROM;Right;Left;10 reps;Seated (AAROM on RLE)   PT Diagnosis:    PT Problem List:   PT Treatment Interventions:     PT Goals Acute Rehab PT Goals Pt will go Supine/Side to Sit: with HOB 0 degrees;with min assist PT Goal: Supine/Side to Sit - Progress: Revised due to lack of progress PT Goal: Sit to Supine/Side - Progress: Progressing toward goal PT Goal: Perform Home Exercise Program - Progress: Progressing toward goal  Visit Information  Last PT Received On: 12/29/11 Assistance Needed: +2    Subjective Data  Subjective: oh, this whole side just hurts   Cognition  Overall Cognitive Status: Appears within functional limits for tasks assessed/performed Arousal/Alertness: Awake/alert Orientation Level: Appears intact for tasks assessed Behavior During Session: [facility] for tasks performed    Balance  Static Sitting Balance Static Sitting - Balance Support: Feet supported Static Sitting - Level  of Assistance: 6: Modified independent (Device/Increase time) Static Sitting - Comment/# of Minutes: 12  End of Session PT - End of Session Activity Tolerance: Patient limited by pain Patient left: in bed;with call  bell/phone within reach (bed in chair position) Nurse Communication: Mobility status   GP     Toney Sang Beth 12/29/2011, 10:47 AM Delaney Meigs, PT [phone]

## 2011-12-29 NOTE — Progress Notes (Signed)
Inpatient Diabetes Program Recommendations  AACE/ADA: New Consensus Statement on Inpatient Glycemic Control (2009)  Target Ranges:  Prepandial:   less than 140 mg/dL      Peak postprandial:   less than 180 mg/dL (1-2 hours)      Critically ill patients:  140 - 180 mg/dL   Reason for Visit: Post-prandial hyperglycemia  Inpatient Diabetes Program Recommendations Insulin - Basal: Fastings are controlled. Pt does NOT need increase in Lantus. Insulin - Meal Coverage: Please add 3-4 units tidwc Novolog meal coverage.  (Glipizide does not seem to be effective). HgbA1C: xxx  Note: Thank you, Lenor Coffin, RN, CNS, Diabetes Coordinator [phone])

## 2011-12-30 ENCOUNTER — Other Ambulatory Visit: Payer: Self-pay | Admitting: Orthopedic Surgery

## 2012-01-01 ENCOUNTER — Encounter (HOSPITAL_COMMUNITY): Payer: Self-pay

## 2012-01-01 MED ORDER — CHLORHEXIDINE GLUCONATE 4 % EX LIQD: 60.0000 mL | Freq: Once | CUTANEOUS | Status: DC

## 2012-01-01 NOTE — Progress Notes (Signed)
PAT instructions given to Inetta Fermo at [facility] nursing home.

## 2012-01-02 ENCOUNTER — Ambulatory Visit (HOSPITAL_COMMUNITY): Payer: Medicare HMO

## 2012-01-02 ENCOUNTER — Encounter (HOSPITAL_COMMUNITY): Payer: Self-pay | Admitting: *Deleted

## 2012-01-02 ENCOUNTER — Ambulatory Visit (HOSPITAL_COMMUNITY)
Admission: RE | Admit: 2012-01-02 | Discharge: 2012-01-03 | Disposition: A | Discharge status: Skilled Nursing Facility | Payer: Medicare HMO | Source: Ambulatory Visit | Attending: Orthopedic Surgery | Admitting: Orthopedic Surgery

## 2012-01-02 ENCOUNTER — Encounter (HOSPITAL_COMMUNITY): Payer: Self-pay | Admitting: Anesthesiology

## 2012-01-02 ENCOUNTER — Ambulatory Visit (HOSPITAL_COMMUNITY): Payer: Medicare HMO | Admitting: Anesthesiology

## 2012-01-02 ENCOUNTER — Encounter (HOSPITAL_COMMUNITY)
Admission: RE | Disposition: A | Discharge status: Skilled Nursing Facility | Payer: Self-pay | Source: Ambulatory Visit | Attending: Orthopedic Surgery

## 2012-01-02 DIAGNOSIS — E119 Type 2 diabetes mellitus without complications: Secondary | ICD-10-CM | POA: Insufficient documentation

## 2012-01-02 DIAGNOSIS — K219 Gastro-esophageal reflux disease without esophagitis: Secondary | ICD-10-CM | POA: Insufficient documentation

## 2012-01-02 DIAGNOSIS — S5290XA Unspecified fracture of unspecified forearm, initial encounter for closed fracture: Secondary | ICD-10-CM

## 2012-01-02 DIAGNOSIS — F411 Generalized anxiety disorder: Secondary | ICD-10-CM | POA: Insufficient documentation

## 2012-01-02 DIAGNOSIS — S62109A Fracture of unspecified carpal bone, unspecified wrist, initial encounter for closed fracture: Secondary | ICD-10-CM | POA: Diagnosis present

## 2012-01-02 DIAGNOSIS — S63016A Dislocation of distal radioulnar joint of unspecified wrist, initial encounter: Secondary | ICD-10-CM | POA: Insufficient documentation

## 2012-01-02 DIAGNOSIS — M109 Gout, unspecified: Secondary | ICD-10-CM

## 2012-01-02 DIAGNOSIS — E785 Hyperlipidemia, unspecified: Secondary | ICD-10-CM | POA: Insufficient documentation

## 2012-01-02 DIAGNOSIS — D509 Iron deficiency anemia, unspecified: Secondary | ICD-10-CM

## 2012-01-02 DIAGNOSIS — G4730 Sleep apnea, unspecified: Secondary | ICD-10-CM | POA: Insufficient documentation

## 2012-01-02 DIAGNOSIS — R5381 Other malaise: Secondary | ICD-10-CM

## 2012-01-02 DIAGNOSIS — I10 Essential (primary) hypertension: Secondary | ICD-10-CM | POA: Insufficient documentation

## 2012-01-02 DIAGNOSIS — S52309A Unspecified fracture of shaft of unspecified radius, initial encounter for closed fracture: Secondary | ICD-10-CM | POA: Insufficient documentation

## 2012-01-02 DIAGNOSIS — W19XXXA Unspecified fall, initial encounter: Secondary | ICD-10-CM | POA: Insufficient documentation

## 2012-01-02 DIAGNOSIS — S82409A Unspecified fracture of shaft of unspecified fibula, initial encounter for closed fracture: Secondary | ICD-10-CM

## 2012-01-02 DIAGNOSIS — M1710 Unilateral primary osteoarthritis, unspecified knee: Secondary | ICD-10-CM | POA: Insufficient documentation

## 2012-01-02 DIAGNOSIS — R5383 Other fatigue: Secondary | ICD-10-CM

## 2012-01-02 DIAGNOSIS — G4733 Obstructive sleep apnea (adult) (pediatric): Secondary | ICD-10-CM

## 2012-01-02 HISTORY — PX: ORIF WRIST FRACTURE: SHX2133

## 2012-01-02 HISTORY — DX: Urinary tract infection, site not specified: N39.0

## 2012-01-02 LAB — GLUCOSE, CAPILLARY
Glucose-Capillary: 78 mg/dL (ref 70–99)
Glucose-Capillary: 83 mg/dL (ref 70–99)
Glucose-Capillary: 73 mg/dL (ref 70–99)

## 2012-01-02 LAB — SURGICAL PCR SCREEN
MRSA, PCR: NEGATIVE
Staphylococcus aureus: NEGATIVE

## 2012-01-02 SURGERY — OPEN REDUCTION INTERNAL FIXATION (ORIF) WRIST FRACTURE
Anesthesia: General | Site: Wrist | Laterality: Right | Wound class: Clean

## 2012-01-02 MED ORDER — CEFAZOLIN SODIUM 1-5 GM-% IV SOLN
1.0000 g | Freq: Three times a day (TID) | INTRAVENOUS | Status: DC
  Administered 2012-01-03 (×2): 1 g via INTRAVENOUS
  Filled 2012-01-02 (×3): qty 50

## 2012-01-02 MED ORDER — FERROUS SULFATE 325 (65 FE) MG PO TABS
ORAL_TABLET | Freq: Every day | ORAL | Status: DC
  Administered 2012-01-02: 325 mg via ORAL
  Filled 2012-01-02: qty 1

## 2012-01-02 MED ORDER — HYDROMORPHONE HCL PF 1 MG/ML IJ SOLN
0.2500 mg | INTRAMUSCULAR | Status: DC | PRN
  Administered 2012-01-02 (×2): 0.5 mg via INTRAVENOUS

## 2012-01-02 MED ORDER — BENAZEPRIL HCL 40 MG PO TABS
40.0000 mg | ORAL_TABLET | Freq: Two times a day (BID) | ORAL | Status: DC
Start: 2012-01-02 — End: 2012-01-03
  Administered 2012-01-02 – 2012-01-03 (×2): 40 mg via ORAL
  Filled 2012-01-02 (×4): qty 1

## 2012-01-02 MED ORDER — LABETALOL HCL 200 MG PO TABS
200.0000 mg | ORAL_TABLET | Freq: Once | ORAL | Status: AC
  Administered 2012-01-02: 200 mg via ORAL
  Filled 2012-01-02: qty 1

## 2012-01-02 MED ORDER — LACTATED RINGERS IV SOLN
INTRAVENOUS | Status: DC | PRN
  Administered 2012-01-02 (×2): via INTRAVENOUS

## 2012-01-02 MED ORDER — HYDROMORPHONE HCL PF 1 MG/ML IJ SOLN
0.5000 mg | INTRAMUSCULAR | Status: DC | PRN
  Administered 2012-01-02: 0.5 mg via INTRAVENOUS
  Administered 2012-01-03: 1 mg via INTRAVENOUS
  Filled 2012-01-02 (×2): qty 1

## 2012-01-02 MED ORDER — HYDROMORPHONE HCL PF 1 MG/ML IJ SOLN
INTRAMUSCULAR | Status: AC
  Administered 2012-01-02: 0.5 mg via INTRAVENOUS
  Filled 2012-01-02: qty 1

## 2012-01-02 MED ORDER — INSULIN GLARGINE 100 UNIT/ML ~~LOC~~ SOLN: 45.0000 [IU] | Freq: Every day | SUBCUTANEOUS | Status: DC

## 2012-01-02 MED ORDER — LIDOCAINE HCL (CARDIAC) 20 MG/ML IV SOLN
INTRAVENOUS | Status: DC | PRN
  Administered 2012-01-02: 60 mg via INTRAVENOUS

## 2012-01-02 MED ORDER — BUPIVACAINE HCL (PF) 0.25 % IJ SOLN
INTRAMUSCULAR | Status: DC | PRN
  Administered 2012-01-02: 10 mL

## 2012-01-02 MED ORDER — MUPIROCIN 2 % EX OINT
TOPICAL_OINTMENT | CUTANEOUS | Status: AC
  Filled 2012-01-02: qty 22

## 2012-01-02 MED ORDER — BUPIVACAINE HCL (PF) 0.25 % IJ SOLN
INTRAMUSCULAR | Status: AC
  Filled 2012-01-02: qty 30

## 2012-01-02 MED ORDER — FERROUS SULFATE 325 (65 FE) MG PO TABS
325.0000 mg | ORAL_TABLET | Freq: Every day | ORAL | Status: DC
  Administered 2012-01-03: 325 mg via ORAL
  Filled 2012-01-02 (×4): qty 1

## 2012-01-02 MED ORDER — 0.9 % SODIUM CHLORIDE (POUR BTL) OPTIME
TOPICAL | Status: DC | PRN
  Administered 2012-01-02: 1000 mL

## 2012-01-02 MED ORDER — ENOXAPARIN SODIUM 30 MG/0.3ML ~~LOC~~ SOLN: 30.0000 mg | Freq: Two times a day (BID) | SUBCUTANEOUS | Status: DC

## 2012-01-02 MED ORDER — INSULIN GLARGINE 100 UNIT/ML ~~LOC~~ SOLN: 60.0000 [IU] | Freq: Every day | SUBCUTANEOUS | Status: DC

## 2012-01-02 MED ORDER — CLONIDINE HCL 0.2 MG PO TABS
0.2000 mg | ORAL_TABLET | Freq: Every day | ORAL | Status: DC
  Administered 2012-01-02 – 2012-01-03 (×2): 0.2 mg via ORAL
  Filled 2012-01-02 (×2): qty 1

## 2012-01-02 MED ORDER — SIMVASTATIN 20 MG PO TABS
20.0000 mg | ORAL_TABLET | Freq: Every evening | ORAL | Status: DC
  Administered 2012-01-02: 20 mg via ORAL
  Filled 2012-01-02 (×2): qty 1

## 2012-01-02 MED ORDER — CEFAZOLIN SODIUM 1-5 GM-% IV SOLN
1.0000 g | INTRAVENOUS | Status: AC
  Administered 2012-01-02: 1 g via INTRAVENOUS
  Filled 2012-01-02: qty 50

## 2012-01-02 MED ORDER — LACTATED RINGERS IV SOLN
INTRAVENOUS | Status: DC
  Administered 2012-01-02 – 2012-01-03 (×2): via INTRAVENOUS

## 2012-01-02 MED ORDER — POTASSIUM CHLORIDE CRYS ER 20 MEQ PO TBCR
20.0000 meq | EXTENDED_RELEASE_TABLET | Freq: Every day | ORAL | Status: DC
  Administered 2012-01-02 – 2012-01-03 (×2): 20 meq via ORAL
  Filled 2012-01-02 (×2): qty 1

## 2012-01-02 MED ORDER — GLIPIZIDE ER 5 MG PO TB24
5.0000 mg | ORAL_TABLET | Freq: Once | ORAL | Status: DC
  Administered 2012-01-02: 5 mg via ORAL
  Filled 2012-01-02: qty 1

## 2012-01-02 MED ORDER — AMLODIPINE BESYLATE 10 MG PO TABS
10.0000 mg | ORAL_TABLET | Freq: Every day | ORAL | Status: DC
  Administered 2012-01-02 – 2012-01-03 (×2): 10 mg via ORAL
  Filled 2012-01-02 (×2): qty 1

## 2012-01-02 MED ORDER — OXYCODONE-ACETAMINOPHEN 5-325 MG PO TABS
1.0000 | ORAL_TABLET | ORAL | Status: DC | PRN
  Administered 2012-01-02 – 2012-01-03 (×4): 2 via ORAL
  Filled 2012-01-02 (×5): qty 2

## 2012-01-02 MED ORDER — MUPIROCIN 2 % EX OINT
TOPICAL_OINTMENT | Freq: Once | CUTANEOUS | Status: AC
  Administered 2012-01-02: 1 via NASAL

## 2012-01-02 MED ORDER — PROPOFOL 10 MG/ML IV EMUL
INTRAVENOUS | Status: DC | PRN
  Administered 2012-01-02: 180 mg via INTRAVENOUS

## 2012-01-02 MED ORDER — ISOSORBIDE MONONITRATE ER 60 MG PO TB24
60.0000 mg | ORAL_TABLET | Freq: Every day | ORAL | Status: DC
Start: 2012-01-02 — End: 2012-01-03
  Administered 2012-01-02: 60 mg via ORAL
  Filled 2012-01-02 (×2): qty 1

## 2012-01-02 MED ORDER — ENOXAPARIN SODIUM 30 MG/0.3ML ~~LOC~~ SOLN
30.0000 mg | Freq: Two times a day (BID) | SUBCUTANEOUS | Status: DC
  Administered 2012-01-03: 30 mg via SUBCUTANEOUS
  Filled 2012-01-02 (×3): qty 0.3

## 2012-01-02 MED ORDER — FENTANYL CITRATE 0.05 MG/ML IJ SOLN
INTRAMUSCULAR | Status: DC | PRN
  Administered 2012-01-02 (×2): 50 ug via INTRAVENOUS
  Administered 2012-01-02: 100 ug via INTRAVENOUS
  Administered 2012-01-02 (×2): 50 ug via INTRAVENOUS

## 2012-01-02 MED ORDER — LORAZEPAM 1 MG PO TABS: 1.0000 mg | ORAL_TABLET | Freq: Two times a day (BID) | ORAL | Status: DC | PRN

## 2012-01-02 MED ORDER — HYDROCODONE-ACETAMINOPHEN 5-325 MG PO TABS: 1.0000 | ORAL_TABLET | ORAL | Status: DC | PRN

## 2012-01-02 MED ORDER — HYDROCHLOROTHIAZIDE 12.5 MG PO CAPS
12.5000 mg | ORAL_CAPSULE | Freq: Every day | ORAL | Status: DC
  Administered 2012-01-02 – 2012-01-03 (×2): 12.5 mg via ORAL
  Filled 2012-01-02 (×2): qty 1

## 2012-01-02 MED ORDER — CEFAZOLIN SODIUM 1-5 GM-% IV SOLN
INTRAVENOUS | Status: DC | PRN
  Administered 2012-01-02: 2 g via INTRAVENOUS

## 2012-01-02 MED ORDER — LABETALOL HCL 100 MG PO TABS
100.0000 mg | ORAL_TABLET | Freq: Once | ORAL | Status: AC
  Administered 2012-01-02: 100 mg via ORAL
  Filled 2012-01-02: qty 1

## 2012-01-02 MED ORDER — INSULIN ASPART 100 UNIT/ML ~~LOC~~ SOLN: 0.0000 [IU] | Freq: Three times a day (TID) | SUBCUTANEOUS | Status: DC

## 2012-01-02 MED ORDER — METFORMIN HCL 500 MG PO TABS: 500.0000 mg | ORAL_TABLET | Freq: Two times a day (BID) | ORAL | Status: DC

## 2012-01-02 MED ORDER — CEFAZOLIN SODIUM-DEXTROSE 2-3 GM-% IV SOLR
INTRAVENOUS | Status: AC
  Filled 2012-01-02: qty 50

## 2012-01-02 SURGICAL SUPPLY — 68 items
BANDAGE ELASTIC 3 VELCRO ST LF (GAUZE/BANDAGES/DRESSINGS) ×2 IMPLANT
BANDAGE ELASTIC 4 VELCRO ST LF (GAUZE/BANDAGES/DRESSINGS) ×2 IMPLANT
BANDAGE GAUZE ELAST BULKY 4 IN (GAUZE/BANDAGES/DRESSINGS) ×2 IMPLANT
BIT DRILL 2 FAST STEP (BIT) ×2 IMPLANT
BIT DRILL 2.5X4 QC (BIT) ×2 IMPLANT
BNDG CMPR 9X4 STRL LF SNTH (GAUZE/BANDAGES/DRESSINGS) ×1
BNDG ESMARK 4X9 LF (GAUZE/BANDAGES/DRESSINGS) ×2 IMPLANT
CANISTER SUCTION 2500CC (MISCELLANEOUS) ×2 IMPLANT
CLOTH BEACON ORANGE TIMEOUT ST (SAFETY) ×2 IMPLANT
CORDS BIPOLAR (ELECTRODE) ×2 IMPLANT
COVER SURGICAL LIGHT HANDLE (MISCELLANEOUS) ×2 IMPLANT
CUFF TOURNIQUET SINGLE 18IN (TOURNIQUET CUFF) IMPLANT
CUFF TOURNIQUET SINGLE 24IN (TOURNIQUET CUFF) ×2 IMPLANT
DRAPE OEC MINIVIEW 54X84 (DRAPES) ×2 IMPLANT
DRAPE SURG 17X23 STRL (DRAPES) ×4 IMPLANT
DURAPREP 26ML APPLICATOR (WOUND CARE) ×2 IMPLANT
ELECT REM PT RETURN 9FT ADLT (ELECTROSURGICAL)
ELECTRODE REM PT RTRN 9FT ADLT (ELECTROSURGICAL) IMPLANT
GAUZE XEROFORM 1X8 LF (GAUZE/BANDAGES/DRESSINGS) ×2 IMPLANT
GLOVE BIO SURGEON STRL SZ8.5 (GLOVE) ×2 IMPLANT
GLOVE BIOGEL PI IND STRL 6.5 (GLOVE) ×1 IMPLANT
GLOVE BIOGEL PI IND STRL 7.0 (GLOVE) ×3 IMPLANT
GLOVE BIOGEL PI INDICATOR 6.5 (GLOVE) ×1
GLOVE BIOGEL PI INDICATOR 7.0 (GLOVE) ×3
GLOVE ECLIPSE 6.5 STRL STRAW (GLOVE) ×2 IMPLANT
GLOVE SURG SS PI 6.5 STRL IVOR (GLOVE) ×2 IMPLANT
GOWN BRE IMP SLV AUR LG STRL (GOWN DISPOSABLE) ×2 IMPLANT
GOWN SRG XL XLNG 56XLVL 4 (GOWN DISPOSABLE) ×1 IMPLANT
GOWN STRL NON-REIN LRG LVL3 (GOWN DISPOSABLE) ×6 IMPLANT
GOWN STRL NON-REIN XL XLG LVL4 (GOWN DISPOSABLE) ×2
KIT BASIN OR (CUSTOM PROCEDURE TRAY) ×2 IMPLANT
KIT ROOM TURNOVER OR (KITS) ×2 IMPLANT
MANIFOLD NEPTUNE II (INSTRUMENTS) IMPLANT
NEEDLE HYPO 25GX1X1/2 BEV (NEEDLE) IMPLANT
NEEDLE HYPO 25X1 1.5 SAFETY (NEEDLE) ×2 IMPLANT
NS IRRIG 1000ML POUR BTL (IV SOLUTION) ×2 IMPLANT
PACK ORTHO EXTREMITY (CUSTOM PROCEDURE TRAY) ×2 IMPLANT
PAD ARMBOARD 7.5X6 YLW CONV (MISCELLANEOUS) ×2 IMPLANT
PAD CAST 3X4 CTTN HI CHSV (CAST SUPPLIES) ×1 IMPLANT
PAD CAST 4YDX4 CTTN HI CHSV (CAST SUPPLIES) ×2 IMPLANT
PADDING CAST ABS 4INX4YD NS (CAST SUPPLIES) ×1
PADDING CAST ABS COTTON 4X4 ST (CAST SUPPLIES) ×1 IMPLANT
PADDING CAST COTTON 3X4 STRL (CAST SUPPLIES) ×2
PADDING CAST COTTON 4X4 STRL (CAST SUPPLIES) ×4
PEG FULLY THREADED 2.5X22MM (Peg) ×8 IMPLANT
PEG THREADED 2.5MMX22MM LONG (Peg) ×2 IMPLANT
PENCIL BUTTON HOLSTER BLD 10FT (ELECTRODE) IMPLANT
PLATE XLONG 24.4X89.5 RT (Plate) ×2 IMPLANT
SCREW CORT 3.5X14 LNG (Screw) ×10 IMPLANT
SCREW MULTI DIRECT 18MM (Screw) ×2 IMPLANT
SCREW PEG 2.5X22 NONLOCK (Screw) ×4 IMPLANT
SCREW PEG LOCK 2.5X20 (Peg) ×2 IMPLANT
SPONGE GAUZE 4X4 12PLY (GAUZE/BANDAGES/DRESSINGS) ×2 IMPLANT
SPONGE LAP 4X18 X RAY DECT (DISPOSABLE) ×2 IMPLANT
SPONGE SCRUB IODOPHOR (GAUZE/BANDAGES/DRESSINGS) IMPLANT
STAPLER VISISTAT 35W (STAPLE) ×2 IMPLANT
SUCTION FRAZIER TIP 10 FR DISP (SUCTIONS) ×2 IMPLANT
SUT PROLENE 3 0 PS 2 (SUTURE) IMPLANT
SUT PROLENE 4 0 P 3 18 (SUTURE) ×2 IMPLANT
SUT VIC AB 2-0 FS1 27 (SUTURE) ×2 IMPLANT
SUT VIC AB 3-0 FS2 27 (SUTURE) IMPLANT
SUT VICRYL 4-0 PS2 18IN ABS (SUTURE) IMPLANT
SYR CONTROL 10ML LL (SYRINGE) ×2 IMPLANT
TOWEL OR 17X24 6PK STRL BLUE (TOWEL DISPOSABLE) ×2 IMPLANT
TOWEL OR 17X26 10 PK STRL BLUE (TOWEL DISPOSABLE) ×2 IMPLANT
TUBE CONNECTING 12X1/4 (SUCTIONS) ×2 IMPLANT
UNDERPAD 30X30 INCONTINENT (UNDERPADS AND DIAPERS) ×2 IMPLANT
WATER STERILE IRR 1000ML POUR (IV SOLUTION) IMPLANT

## 2012-01-02 NOTE — Addendum Note (Signed)
Addendum  created 01/02/12 1640 by Bedelia Person, MD   Modules edited:Anesthesia Attestations, Notes Section

## 2012-01-02 NOTE — Anesthesia Procedure Notes (Addendum)
Procedure Name: LMA Insertion Date/Time: 01/02/2012 1:41 PM Performed by: Glendora Score A Pre-anesthesia Checklist: Patient identified, Emergency Drugs available, Suction available and Patient being monitored Patient Re-evaluated:Patient Re-evaluated prior to inductionOxygen Delivery Method: Circle system utilized Preoxygenation: Pre-oxygenation with 100% oxygen Intubation Type: IV induction LMA: LMA with gastric port inserted LMA Size: 4.0 Number of attempts: 1 Placement Confirmation: positive ETCO2 and breath sounds checked- equal and bilateral Tube secured with: Tape Dental Injury: Teeth and Oropharynx as per pre-operative assessment    Anesthesia Regional Block:   Narrative:

## 2012-01-02 NOTE — Brief Op Note (Signed)
01/02/2012  3:24 PM  PATIENT:  Katie Dawson  78 y.o. female  PRE-OPERATIVE DIAGNOSIS:  fracture right distal radius   POST-OPERATIVE DIAGNOSIS:  fracture right distal radius   PROCEDURE:  Procedure(s) (LRB): OPEN REDUCTION INTERNAL FIXATION (ORIF) WRIST FRACTURE (Right)  SURGEON:  Surgeon(s) and Role:    * Marlowe Shores, MD - Primary  PHYSICIAN ASSISTANT:   ASSISTANTS: none   ANESTHESIA:   general  EBL:  Total I/O In: 1250 [I.V.:1250] Out: -   BLOOD ADMINISTERED:none  DRAINS: none   LOCAL MEDICATIONS USED:  MARCAINE   10cc  SPECIMEN:  No Specimen  DISPOSITION OF SPECIMEN:  N/A  COUNTS:  YES  TOURNIQUET:   Total Tourniquet Time Documented: Upper Arm (Right) - 81 minutes  DICTATION: .Other Dictation: Dictation Number [id number]  PLAN OF CARE: Admit for overnight observation  PATIENT DISPOSITION:  PACU - hemodynamically stable.   Delay start of Pharmacological VTE agent (>24hrs) due to surgical blood loss or risk of bleeding: yes

## 2012-01-02 NOTE — Transfer of Care (Signed)
Immediate Anesthesia Transfer of Care Note  Patient: Katie Dawson  Procedure(s) Performed: Procedure(s) (LRB): OPEN REDUCTION INTERNAL FIXATION (ORIF) WRIST FRACTURE (Right)  Patient Location: PACU  Anesthesia Type: General  Level of Consciousness: awake and alert   Airway & Oxygen Therapy: Patient Spontanous Breathing and Patient connected to nasal cannula oxygen  Post-op Assessment: Report given to PACU RN and Post -op Vital signs reviewed and stable  Post vital signs: Reviewed and stable  Complications: No apparent anesthesia complications

## 2012-01-02 NOTE — Progress Notes (Signed)
Per Dr. Randa Evens do not repeat labs

## 2012-01-02 NOTE — H&P (Signed)
Katie Dawson is an 78 y.o. female.   Chief Complaint: right wrist pain HPI: as above s/p fall with displaced right distal radius fracture  Past Medical History  Diagnosis Date  . DIABETES MELLITUS, TYPE II 04/22/2007  . HYPERLIPIDEMIA 04/22/2007  . GOUT 04/22/2007  . ANEMIA-IRON DEFICIENCY 04/22/2007  . ANXIETY 04/22/2007  . OBSTRUCTIVE SLEEP APNEA 04/22/2007  . HYPERTENSION 04/22/2007  . GERD 04/22/2007  . RENAL INSUFFICIENCY 04/22/2007  . 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 FOOT PAIN, LEFT 12/23/2007  . OSTEOPENIA 02/12/2009  . INTERMITTENT VERTIGO 04/20/2009  . HYPERSOMNIA 01/11/2009  . COLONIC POLYPS, HX OF 04/22/2007  . NEPHRECTOMY, HX OF 04/22/2007  . Urinary tract infection     hx of    Past Surgical History  Procedure Date  . S/p right nephrectomy   . S/p parathyroid surgury     Family History  Problem Relation Age of Onset  . Heart disease Mother    Social History:  reports that she has never smoked. She has never used smokeless tobacco. She reports that she does not drink alcohol or use illicit drugs.  Allergies:  Allergies  Allergen Reactions  . Indomethacin     REACTION: renal insuff    Medications Prior to Admission  Medication Sig Dispense Refill  . amLODipine (NORVASC) 10 MG tablet Take 10 mg by mouth daily.        . benazepril (LOTENSIN) 40 MG tablet Take 1 tablet (40 mg total) by mouth 2 (two) times daily.  180 tablet  1  . cloNIDine (CATAPRES) 0.1 MG tablet TAKE 1 TABLET TWICE DAILY.  60 tablet  9  . Ferrous Sulfate (IRON) 325 (65 FE) MG TABS Take by mouth daily.        Marland Kitchen GLUCOTROL XL 10 MG 24 hr tablet TAKE (2) TABLETS DAILY.  180 each  0  . hydrochlorothiazide (MICROZIDE) 12.5 MG capsule Take 1 capsule (12.5 mg total) by mouth daily.      Marland Kitchen HYDROcodone-acetaminophen (NORCO/VICODIN) 5-325 MG per tablet Take 1-2 tablets by mouth every 4 (four) hours as needed.  30 tablet  0  . insulin glargine (LANTUS) 100 UNIT/ML injection Inject 60  Units into the skin at bedtime.  10 mL    . isosorbide mononitrate (IMDUR) 60 MG 24 hr tablet Take 60 mg by mouth at bedtime.        Marland Kitchen labetalol (NORMODYNE) 200 MG tablet TAKE 1 TABLET TWICE DAILY.  60 tablet  0  . LORazepam (ATIVAN) 1 MG tablet Take 1 tablet (1 mg total) by mouth 2 (two) times daily as needed for anxiety.  60 tablet  2  . metFORMIN (GLUCOPHAGE) 500 MG tablet Take 1 tablet (500 mg total) by mouth 2 (two) times daily with a meal.      . potassium chloride SA (K-DUR,KLOR-CON) 20 MEQ tablet Take 20 mEq by mouth daily.      . simvastatin (ZOCOR) 20 MG tablet Take 20 mg by mouth every evening.        Results for orders placed during the hospital encounter of 01/02/12 (from the past 48 hour(s))  SURGICAL PCR SCREEN     Status: Normal   Collection Time   01/02/12 10:44 AM      Component Value Range Comment   MRSA, PCR NEGATIVE  NEGATIVE    Staphylococcus aureus NEGATIVE  NEGATIVE   GLUCOSE, CAPILLARY     Status: Normal   Collection Time   01/02/12 11:06 AM  Component Value Range Comment   Glucose-Capillary 78  70 - 99 mg/dL    Comment 1 Notify RN      Dg Chest Port 1 View  01/02/2012  *RADIOLOGY REPORT*  Clinical Data: Preop  PORTABLE CHEST - 1 VIEW  Comparison: 03/30/2009  Findings: Mild cardiomegaly.  Normal vascularity.  No pneumothorax or pleural effusion.  No mass or consolidation.  IMPRESSION: Cardiomegaly without edema.  Original Report Authenticated By: Donavan Burnet, M.D.    Review of Systems  All other systems reviewed and are negative.    Blood pressure 143/66, pulse 72, temperature 98.1 F (36.7 C), temperature source Oral, resp. rate 18, weight 122.131 kg (269 lb 4 oz), SpO2 95.00%. Physical Exam  Constitutional: She is oriented to person, place, and time. She appears well-developed and well-nourished.  HENT:  Head: Normocephalic and atraumatic.  Cardiovascular: Normal rate.   Respiratory: Effort normal.  Musculoskeletal:       Right wrist: She exhibits  tenderness, bony tenderness, crepitus and deformity.  Neurological: She is alert and oriented to person, place, and time.  Skin: Skin is warm.  Psychiatric: She has a normal mood and affect. Her speech is normal and behavior is normal. Thought content normal.     Assessment/Plan As above   Plan ORIF above and admit to medicine  Laveta Gilkey A 01/02/2012, 1:25 PM

## 2012-01-02 NOTE — Anesthesia Preprocedure Evaluation (Addendum)
Anesthesia Evaluation    Airway Mallampati: II      Dental   Pulmonary sleep apnea ,  breath sounds clear to auscultation        Cardiovascular hypertension, Pt. on medications Rhythm:Regular Rate:Normal     Neuro/Psych Anxiety    GI/Hepatic Neg liver ROS, GERD-  ,  Endo/Other    Renal/GU negative Renal ROS     Musculoskeletal   Abdominal   Peds  Hematology   Anesthesia Other Findings   Reproductive/Obstetrics                           Anesthesia Physical Anesthesia Plan  ASA: III  Anesthesia Plan: General   Post-op Pain Management:    Induction: Intravenous  Airway Management Planned: Oral ETT  Additional Equipment:   Intra-op Plan:   Post-operative Plan: Possible Post-op intubation/ventilation  Informed Consent: I have reviewed the patients History and Physical, chart, labs and discussed the procedure including the risks, benefits and alternatives for the proposed anesthesia with the patient or authorized representative who has indicated his/her understanding and acceptance.   Dental advisory given  Plan Discussed with: CRNA, Anesthesiologist and Surgeon  Anesthesia Plan Comments:        Anesthesia Quick Evaluation

## 2012-01-02 NOTE — Anesthesia Postprocedure Evaluation (Signed)
  Anesthesia Post-op Note  Patient: Katie Dawson  Procedure(s) Performed: Procedure(s) (LRB): OPEN REDUCTION INTERNAL FIXATION (ORIF) WRIST FRACTURE (Right)  Patient Location: PACU  Anesthesia Type: General  Level of Consciousness: awake  Airway and Oxygen Therapy: Patient Spontanous Breathing  Post-op Pain: mild  Post-op Assessment: Post-op Vital signs reviewed, Patient's Cardiovascular Status Stable, Respiratory Function Stable, Patent Airway, No signs of Nausea or vomiting and Pain level controlled  Post-op Vital Signs: stable  Complications: No apparent anesthesia complications

## 2012-01-02 NOTE — Op Note (Signed)
See dictated note [phone]

## 2012-01-02 NOTE — Addendum Note (Signed)
Addendum  created 01/02/12 1631 by Bedelia Person, MD   Modules edited:Anesthesia Attestations, Notes Section

## 2012-01-02 NOTE — Consult Note (Signed)
Reason for Consult: Evaluation and management recommendations for diabetes mellitus, HTN, CKD, OSA and other medical problems Referring Physician:Dr. Mina Marble - Orthopedic Surgery  Katie Dawson is an 78 y.o. female.  HPI: This patient is a pleasant female who presented for scheduled surgery today for ORIF of her right wrist performed by orthopedist Dr. Mina Marble.  She has a history of frequent falls and was recently discharged from the hospital from a right closed fibular fracture (treated conservatively) NWB x 1 month.  Pt is a resident of the [facility].  Pt has PMH detailed below but mostly significant for DM, HTN, OSA, CKD, Anemia and has a history of having had a nephrectomy.  The patient had surgery today and did very well.  A medical consult was requested to help monitor patient's medical conditions while she is inpatient and recovering from surgery.    Past Medical History  Diagnosis Date  . DIABETES MELLITUS, TYPE II 04/22/2007  . HYPERLIPIDEMIA 04/22/2007  . GOUT 04/22/2007  . ANEMIA-IRON DEFICIENCY 04/22/2007  . ANXIETY 04/22/2007  . OBSTRUCTIVE SLEEP APNEA 04/22/2007  . HYPERTENSION 04/22/2007  . GERD 04/22/2007  . RENAL INSUFFICIENCY 04/22/2007  . 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 FOOT PAIN, LEFT 12/23/2007  . OSTEOPENIA 02/12/2009  . INTERMITTENT VERTIGO 04/20/2009  . HYPERSOMNIA 01/11/2009  . COLONIC POLYPS, HX OF 04/22/2007  . NEPHRECTOMY, HX OF 04/22/2007  . Urinary tract infection     hx of    Past Surgical History  Procedure Date  . S/p right nephrectomy   . S/p parathyroid surgury   . S/p orif left wrist 01/02/2012    Family History  Problem Relation Age of Onset  . Heart disease Mother     Social History:  reports that she has never smoked. She has never used smokeless tobacco. She reports that she does not drink alcohol or use illicit drugs.  Allergies:  Allergies  Allergen Reactions  . Indomethacin     REACTION:  renal insuff    Medications: I have reviewed the patient's current medications.  Results for orders placed during the hospital encounter of 01/02/12 (from the past 48 hour(s))  SURGICAL PCR SCREEN     Status: Normal   Collection Time   01/02/12 10:44 AM      Component Value Range Comment   MRSA, PCR NEGATIVE  NEGATIVE    Staphylococcus aureus NEGATIVE  NEGATIVE   GLUCOSE, CAPILLARY     Status: Normal   Collection Time   01/02/12 11:06 AM      Component Value Range Comment   Glucose-Capillary 78  70 - 99 mg/dL    Comment 1 Notify RN     GLUCOSE, CAPILLARY     Status: Normal   Collection Time   01/02/12  3:35 PM      Component Value Range Comment   Glucose-Capillary 83  70 - 99 mg/dL    Comment 1 Documented in Chart      Comment 2 Notify RN       Dg Chest Port 1 View  01/02/2012  *RADIOLOGY REPORT*  Clinical Data: Preop  PORTABLE CHEST - 1 VIEW  Comparison: 03/30/2009  Findings: Mild cardiomegaly.  Normal vascularity.  No pneumothorax or pleural effusion.  No mass or consolidation.  IMPRESSION: Cardiomegaly without edema.  Original Report Authenticated By: Donavan Burnet, M.D.    Review of Systems  Constitutional: Negative for fever, chills, weight loss, malaise/fatigue and diaphoresis.  HENT: Positive for congestion and sore throat.  Negative for hearing loss, nosebleeds, neck pain, tinnitus and ear discharge.   Eyes: Negative.   Respiratory: Negative.   Cardiovascular: Positive for leg swelling. Negative for chest pain, palpitations, orthopnea, claudication and PND.  Gastrointestinal: Positive for abdominal pain. Negative for heartburn, nausea, vomiting, diarrhea, blood in stool and melena.  Musculoskeletal: Positive for joint pain and falls. Negative for myalgias and back pain.  Neurological: Positive for weakness. Negative for dizziness, tingling, tremors, speech change, focal weakness and headaches.  Endo/Heme/Allergies: Negative.   Psychiatric/Behavioral: Negative.    Blood  pressure 155/74, pulse 65, temperature 98.2 F (36.8 C), temperature source Oral, resp. rate 21, weight 122.131 kg (269 lb 4 oz), SpO2 95.00%. Physical Exam  Constitutional: She is oriented to person, place, and time. She appears well-developed and well-nourished. No distress.  HENT:  Head: Normocephalic and atraumatic.  Mouth/Throat: Oropharynx is clear and moist. No oropharyngeal exudate.  Eyes: Pupils are equal, round, and reactive to light. Left eye exhibits no discharge. No scleral icterus.  Neck: Normal range of motion. Neck supple. No JVD present. No thyromegaly present.  Cardiovascular: Normal rate and normal heart sounds.  Exam reveals no gallop and no friction rub.   No murmur heard. Respiratory: Effort normal and breath sounds normal. No respiratory distress. She has no wheezes. She has no rales. She exhibits no tenderness.  GI: Soft. She exhibits no distension and no mass. There is no tenderness. There is no rebound and no guarding.  Musculoskeletal: She exhibits edema and tenderness.       Casted right foot Bandaged right wrist 2+ edema bilateral UEs/LEs   Neurological: She is alert and oriented to person, place, and time. No cranial nerve deficit. She exhibits normal muscle tone. Coordination normal.  Skin: Skin is warm and dry. No rash noted. She is not diaphoretic. No erythema.  Psychiatric: She has a normal mood and affect. Her behavior is normal.    Impression/Recommendations:  Diabetes Mellitus, type 2 - recommend continuing current orders to monitor blood glucose closely and provide supplemental insulin as needed to treat high BS readings (>140).   Hypertension - resuming home medications, monitor BP, treat pain effectively.   CKD - IVFs running now post-op, repeat BMP in AM  Anemia - check CBC in AM  GERD - recommend using protonix 40 mg daily  OSA   Please see orders.  Will continue to monitor and follow along with you.  Thank you very much for the consult.     Standley Dakins MD [facility]  [facility] [place], [place]  01/02/2012, 4:10 PM

## 2012-01-02 NOTE — Preoperative (Addendum)
Beta Blockers   Reason not to administer Beta Blockers:Not Applicable 

## 2012-01-02 NOTE — Anesthesia Postprocedure Evaluation (Signed)
  Anesthesia Post-op Note  Patient: Katie Dawson  Procedure(s) Performed: Procedure(s) (LRB): OPEN REDUCTION INTERNAL FIXATION (ORIF) WRIST FRACTURE (Right)  Patient Location: PACU  Anesthesia Type: General  Level of Consciousness: awake, alert , oriented and patient cooperative  Airway and Oxygen Therapy: Patient Spontanous Breathing and Patient connected to nasal cannula oxygen  Post-op Pain: mild  Post-op Assessment: Post-op Vital signs reviewed, Patient's Cardiovascular Status Stable, Respiratory Function Stable, Patent Airway, No signs of Nausea or vomiting and Pain level controlled  Post-op Vital Signs: stable  Complications: No apparent anesthesia complications

## 2012-01-02 NOTE — Progress Notes (Signed)
Notified   DR EDWARDS    OF GLUCOSE 78, PATIENT ASYMPTOMATIC WILL SEND TO OR.

## 2012-01-03 DIAGNOSIS — G4733 Obstructive sleep apnea (adult) (pediatric): Secondary | ICD-10-CM

## 2012-01-03 DIAGNOSIS — D509 Iron deficiency anemia, unspecified: Secondary | ICD-10-CM

## 2012-01-03 DIAGNOSIS — E119 Type 2 diabetes mellitus without complications: Secondary | ICD-10-CM

## 2012-01-03 DIAGNOSIS — K219 Gastro-esophageal reflux disease without esophagitis: Secondary | ICD-10-CM

## 2012-01-03 DIAGNOSIS — I10 Essential (primary) hypertension: Secondary | ICD-10-CM

## 2012-01-03 LAB — COMPREHENSIVE METABOLIC PANEL
ALT: 70 U/L — ABNORMAL HIGH (ref 0–35)
Alkaline Phosphatase: 323 U/L — ABNORMAL HIGH (ref 39–117)
BUN: 24 mg/dL — ABNORMAL HIGH (ref 6–23)
CO2: 26 mEq/L (ref 19–32)
GFR calc Af Amer: 68 mL/min — ABNORMAL LOW (ref >90)
GFR calc non Af Amer: 59 mL/min — ABNORMAL LOW (ref >90)
Glucose, Bld: 90 mg/dL (ref 70–99)
Potassium: 4.4 mEq/L (ref 3.5–5.1)
Sodium: 135 mEq/L (ref 135–145)
Total Bilirubin: 0.4 mg/dL (ref 0.3–1.2)
Total Protein: 6.6 g/dL (ref 6.0–8.3)

## 2012-01-03 LAB — CBC
HCT: 32.8 % — ABNORMAL LOW (ref 36.0–46.0)
Hemoglobin: 10.7 g/dL — ABNORMAL LOW (ref 12.0–15.0)
MCHC: 32.6 g/dL (ref 30.0–36.0)
RBC: 3.6 MIL/uL — ABNORMAL LOW (ref 3.87–5.11)

## 2012-01-03 LAB — GLUCOSE, CAPILLARY
Glucose-Capillary: 123 mg/dL — ABNORMAL HIGH (ref 70–99)
Glucose-Capillary: 86 mg/dL (ref 70–99)

## 2012-01-03 LAB — HEMOGLOBIN A1C
Hgb A1c MFr Bld: 11.2 % — ABNORMAL HIGH (ref <5.7)
Mean Plasma Glucose: 275 mg/dL — ABNORMAL HIGH (ref <117)

## 2012-01-03 MED ORDER — OXYCODONE-ACETAMINOPHEN 5-325 MG PO TABS: 1.0000 | ORAL_TABLET | ORAL | Status: AC | PRN

## 2012-01-03 MED ORDER — FERROUS SULFATE 325 (65 FE) MG PO TABS: 325.0000 mg | ORAL_TABLET | Freq: Every day | ORAL | Status: DC

## 2012-01-03 NOTE — Progress Notes (Signed)
Clinical Social Work Department BRIEF PSYCHOSOCIAL ASSESSMENT 01/03/2012  Patient:  Katie Dawson, Katie Dawson     Account Number:  [id number]     Admit date:  01/02/2012  Clinical Social Worker:  Skip Mayer  Date/Time:  01/03/2012 11:00 AM  Referred by:  RN  Date Referred:  01/03/2012 Referred for  Other - See comment   Other Referral:   From SNF   Interview type:  Patient Other interview type:    PSYCHOSOCIAL DATA Living Status:  FACILITY Admitted from facility:  [facility] Level of care:  Skilled Nursing Facility Primary support name:  [name] Primary support relationship to patient:  CHILD, ADULT Degree of support available:   Adequate, per pt    CURRENT CONCERNS Current Concerns  Post-Acute Placement   Other Concerns:    SOCIAL WORK ASSESSMENT / PLAN CSW called by RN for pt return to SNF.  Pt admitted from [facility] 8/2 and will d/c today 8/3.  RN supervisor at SNF aware of d/c.  CSW contacted pt's S-I-L, [name] [phone], and informed her of d/c at pt's request.  CSW signing off as no other CSW needs identified at this time.   Assessment/plan status:  Information/Referral to [facility] Other assessment/ plan:   Information/referral to community resources:   [employer]  PTAR    PATIENT'S/FAMILY'S RESPONSE TO PLAN OF CARE: Pt reports brief stay at [facility] has "been pleasant but would be better if they would season their food."  Pt reports agreeable to return to SNF.        [name], MSW, [place] [phone] (weekend)

## 2012-01-03 NOTE — Discharge Summary (Addendum)
Physician Discharge Summary  Patient ID: Katie Dawson MRN: [id number] DOB/AGE: Jul 02, 1933 78 y.o.  Admit date: 01/02/2012 Discharge date: 01/03/2012  Admission Diagnoses:Wrist fracture, closed   Discharge Diagnoses:  Principal Problem:  *Wrist fracture, closed   Discharged Condition: stable  Hospital Course: pod 1 s/p orif right radius fracture with no significant changes noted  Plan transfer back to nursing home for continued rehab  Consults:medicine  Significant Diagnostic Studies: none  Treatments: surgery: orif right radius  Discharge Exam: Blood pressure 168/59, pulse 68, temperature 98.6 F (37 C), temperature source Oral, resp. rate 18, height 5\' 6"  (1.676 m), weight 133.811 kg (295 lb), SpO2 96.00%. Doing well pod 1  NVI  Up in bed with pt  Will trtansfer back to nursing home  Disposition: 03-Skilled Nursing Facility  Discharge Orders    Future Appointments: Provider: Department: Dept Phone: Center:   01/12/2012 2:00 PM Corwin Levins, MD [facility] [phone] [facility]     Future Orders Please Complete By Expires   Call MD / Call 911      Comments:   If you experience chest pain or shortness of breath, CALL 911 and be transported to the hospital emergency room.  If you develope a fever above 101 F, pus (white drainage) or increased drainage or redness at the wound, or calf pain, call your surgeon's office.   Constipation Prevention      Comments:   Drink plenty of fluids.  Prune juice may be helpful.  You may use a stool softener, such as Colace (over the counter) 100 mg twice a day.  Use MiraLax (over the counter) for constipation as needed.   Increase activity slowly as tolerated      Discharge instructions      Comments:   Do not change dressing  followup Thursday in my office     Medication List  As of 01/03/2012  4:10 PM   TAKE these medications         amLODipine 10 MG tablet   Commonly known as: NORVASC   Take 10 mg by mouth daily.      benazepril 40 MG  tablet   Commonly known as: LOTENSIN   Take 1 tablet (40 mg total) by mouth 2 (two) times daily.      cloNIDine 0.1 MG tablet   Commonly known as: CATAPRES   TAKE 1 TABLET TWICE DAILY.      Iron 325 (65 FE) MG Tabs   Take by mouth daily.      ferrous sulfate 325 (65 FE) MG tablet   Take 1 tablet (325 mg total) by mouth daily with breakfast.      GLUCOTROL XL 10 MG 24 hr tablet   Generic drug: glipiZIDE   TAKE (2) TABLETS DAILY.      hydrochlorothiazide 12.5 MG capsule   Commonly known as: MICROZIDE   Take 1 capsule (12.5 mg total) by mouth daily.      HYDROcodone-acetaminophen 5-325 MG per tablet   Commonly known as: NORCO/VICODIN   Take 1-2 tablets by mouth every 4 (four) hours as needed.      insulin glargine 100 UNIT/ML injection   Commonly known as: LANTUS   Inject 60 Units into the skin at bedtime.      isosorbide mononitrate 60 MG 24 hr tablet   Commonly known as: IMDUR   Take 60 mg by mouth at bedtime.      labetalol 200 MG tablet   Commonly known as: NORMODYNE  TAKE 1 TABLET TWICE DAILY.      LORazepam 1 MG tablet   Commonly known as: ATIVAN   Take 1 tablet (1 mg total) by mouth 2 (two) times daily as needed for anxiety.      metFORMIN 500 MG tablet   Commonly known as: GLUCOPHAGE   Take 1 tablet (500 mg total) by mouth 2 (two) times daily with a meal.      oxyCODONE-acetaminophen 5-325 MG per tablet   Commonly known as: PERCOCET/ROXICET   Take 1 tablet by mouth every 4 (four) hours as needed for pain.      potassium chloride SA 20 MEQ tablet   Commonly known as: K-DUR,KLOR-CON   Take 20 mEq by mouth daily.      simvastatin 20 MG tablet   Commonly known as: ZOCOR   Take 20 mg by mouth every evening.           Follow-up Information    Follow up with Marlowe Shores, MD on 01/08/2012.   Contact information:   [address] [place] [address] [phone]          Signed: Marlowe Shores 01/03/2012, 4:10 PM

## 2012-01-03 NOTE — Op Note (Signed)
[name], [name] NO.:  [id number]  MEDICAL RECORD NO.:  [id number]  LOCATION:  5N05C                        FACILITY:  [facility]  PHYSICIAN:  Artist Pais. Teyonna Plaisted, M.D.DATE OF BIRTH:  10/05/1933  DATE OF PROCEDURE:  01/02/2012 DATE OF DISCHARGE:                              OPERATIVE REPORT   PREOPERATIVE DIAGNOSIS:  Displaced Galeazzi fracture-dislocation, right distal radius.  POSTOPERATIVE DIAGNOSIS:  Displaced Galeazzi fracture-dislocation, right distal radius.  PROCEDURE:  Open reduction and internal fixation of above.  SURGEON:  Artist Pais. Mina Marble, MD  ASSISTANT:  None.  ANESTHESIA:  General.  No complication.  No drains.  DESCRIPTION OF PROCEDURE:  The patient was taken to the operating suite after induction of adequate general anesthesia.  Right upper extremity was prepped and draped in sterile fashion.  An Esmarch was used to exsanguinate the limb.  Tourniquet was inflated to 250 mmHg.  At this point in time, a volar incision was made on the distal radius area. Skin was incised 8-10 cm of the FCR tendon.  The sheath of the FCR was incised.  FCR was retracted in the midline, radial artery of the lateral side.  Dissection was carried down at the level of the pronator quadratus, this was subperiosteally stripped off the distal radius revealing a comminuted fracture of the radius Galeazzi variant with DRUJ incongruity.  A standard exposure was performed including release of the brachioradialis off of the distal fragment.  There was significant comminution of the shaft fragment including a large volar spike and an ulnar spike, this was carefully reduced, held with a clamp and a single screw was placed from radial to ulnar in midlateral position to hold this.  Once this was done, an extended DVR right plate was fastened to the proximal shaft, fixed with the appropriate cortical screws.  We then reduced the distal fragment, which was  intra-articular in multiple pieces onto the shaft and fixed it with fully-threaded pegs and one multidirectional screw into the styloid.  Intraoperative fluoroscopy revealed an adequate reduction in AP, lateral and oblique view.  The wound was thoroughly irrigated.  The median nerve was carefully identified and traced into the carpal canal.  The transverse carpal wound was divided decompressing the nerve under direct vision. The wound was irrigated again, loosely closed in layers with 2-0 undyed Vicryl for the soft tissues and staples on the skin.  Xeroform, 4x4s, fluffs, and a volar splint was applied.  The patient tolerated the procedure well and went to the recovery room in stable fashion.     Artist Pais Mina Marble, M.D.     MAW/MEDQ  D:  01/02/2012  T:  01/03/2012  Job:  [id number]

## 2012-01-03 NOTE — Progress Notes (Signed)
[facility] Progress Note  01/03/2012  Subjective: Pt without complaints.  She is going back to rehab today.  Her BS have remained controlled.    Objective:  Vital signs in last 24 hours: Filed Vitals:   01/02/12 1637 01/02/12 1641 01/02/12 2145 01/03/12 0547  BP: 175/59  166/49 168/59  Pulse: 71  65 68  Temp: 98 F (36.7 C)  98.3 F (36.8 C) 98.6 F (37 C)  TempSrc: Oral     Resp: 20  18 18   Height: 5\' 6"  (1.676 m)     Weight: 133.811 kg (295 lb)     SpO2: 100% 100% 98% 96%   Weight change: 11.68 kg (25 lb 12 oz)  Intake/Output Summary (Last 24 hours) at 01/03/12 1306 Last data filed at 01/03/12 0443  Gross per 24 hour  Intake 1785.83 ml  Output      0 ml  Net 1785.83 ml   Lab Results  Component Value Date   HGBA1C 11.2* 01/02/2012   HGBA1C 11.8* 12/24/2011   HGBA1C 8.7* 11/05/2010   Lab Results  Component Value Date   LDLCALC  Value: 64        Total Cholesterol/HDL:CHD Risk Coronary Heart Disease Risk Table                     Men   Women  1/2 Average Risk   3.4   3.3  Average Risk       5.0   4.4  2 X Average Risk   9.6   7.1  3 X Average Risk  23.4   11.0        Use the calculated Patient Ratio above and the CHD Risk Table to determine the patient's CHD Risk.        ATP III CLASSIFICATION (LDL):  <100     mg/dL   Optimal  621-308  mg/dL   Near or Above                    Optimal  130-159  mg/dL   Borderline  657-846  mg/dL   High  >962     mg/dL   Very High 02/05/2840   CREATININE 0.93 01/03/2012    Review of Systems As above, otherwise all reviewed and reported negative  Physical Exam General - awake, no distress, cooperative HEENT - NCAT, MMM Lungs - BBS, CTA CV - normal s1, s2 sounds Abd - soft, nondistended, no masses, nontender Ext - right wrist cast wound C/D/I  Lab Results: Results for orders placed during the hospital encounter of 01/02/12 (from the past 24 hour(s))  GLUCOSE, CAPILLARY     Status: Normal   Collection Time   01/02/12  3:35 PM   Component Value Range   Glucose-Capillary 83  70 - 99 mg/dL   Comment 1 Documented in Chart     Comment 2 Notify RN    HEMOGLOBIN A1C     Status: Abnormal   Collection Time   01/02/12  5:28 PM      Component Value Range   Hemoglobin A1C 11.2 (*) <5.7 %   Mean Plasma Glucose 275 (*) <117 mg/dL  GLUCOSE, CAPILLARY     Status: Normal   Collection Time   01/02/12  9:44 PM      Component Value Range   Glucose-Capillary 73  70 - 99 mg/dL  GLUCOSE, CAPILLARY     Status: Abnormal   Collection Time   01/03/12  3:18  AM      Component Value Range   Glucose-Capillary 123 (*) 70 - 99 mg/dL  CBC     Status: Abnormal   Collection Time   01/03/12  5:20 AM      Component Value Range   WBC 13.3 (*) 4.0 - 10.5 K/uL   RBC 3.60 (*) 3.87 - 5.11 MIL/uL   Hemoglobin 10.7 (*) 12.0 - 15.0 g/dL   HCT 16.1 (*) 09.6 - 04.5 %   MCV 91.1  78.0 - 100.0 fL   MCH 29.7  26.0 - 34.0 pg   MCHC 32.6  30.0 - 36.0 g/dL   RDW 40.9  81.1 - 91.4 %   Platelets 490 (*) 150 - 400 K/uL  COMPREHENSIVE METABOLIC PANEL     Status: Abnormal   Collection Time   01/03/12  5:20 AM      Component Value Range   Sodium 135  135 - 145 mEq/L   Potassium 4.4  3.5 - 5.1 mEq/L   Chloride 98  96 - 112 mEq/L   CO2 26  19 - 32 mEq/L   Glucose, Bld 90  70 - 99 mg/dL   BUN 24 (*) 6 - 23 mg/dL   Creatinine, Ser 7.82  0.50 - 1.10 mg/dL   Calcium 9.9  8.4 - 95.6 mg/dL   Total Protein 6.6  6.0 - 8.3 g/dL   Albumin 2.3 (*) 3.5 - 5.2 g/dL   AST 37  0 - 37 U/L   ALT 70 (*) 0 - 35 U/L   Alkaline Phosphatase 323 (*) 39 - 117 U/L   Total Bilirubin 0.4  0.3 - 1.2 mg/dL   GFR calc non Af Amer 59 (*) >90 mL/min   GFR calc Af Amer 68 (*) >90 mL/min  GLUCOSE, CAPILLARY     Status: Normal   Collection Time   01/03/12  7:27 AM      Component Value Range   Glucose-Capillary 86  70 - 99 mg/dL    Micro Results: Recent Results (from the past 240 hour(s))  URINE CULTURE     Status: Normal   Collection Time   12/25/11 10:17 AM      Component Value Range  Status Comment   Specimen Description URINE, CLEAN CATCH   Final    Special Requests NONE   Final    Culture  Setup Time 12/25/2011 10:34   Final    Colony Count 2,000 COLONIES/ML   Final    Culture INSIGNIFICANT GROWTH   Final    Report Status 12/26/2011 FINAL   Final   SURGICAL PCR SCREEN     Status: Normal   Collection Time   01/02/12 10:44 AM      Component Value Range Status Comment   MRSA, PCR NEGATIVE  NEGATIVE Final    Staphylococcus aureus NEGATIVE  NEGATIVE Final     Medications:  Scheduled Meds:   . amLODipine  10 mg Oral Daily  . benazepril  40 mg Oral BID  .  ceFAZolin (ANCEF) IV  1 g Intravenous NOW  .  ceFAZolin (ANCEF) IV  1 g Intravenous Q8H  . cloNIDine  0.2 mg Oral Daily  . enoxaparin (LOVENOX) injection  30 mg Subcutaneous Q12H  . ferrous sulfate  325 mg Oral Q breakfast  . hydrochlorothiazide  12.5 mg Oral Daily  . insulin aspart  0-9 Units Subcutaneous TID WC  . insulin glargine  45 Units Subcutaneous QHS  . isosorbide mononitrate  60 mg Oral QHS  .  labetalol  100 mg Oral Once  . mupirocin ointment      . potassium chloride SA  20 mEq Oral Daily  . simvastatin  20 mg Oral QPM  . DISCONTD: chlorhexidine  60 mL Topical Once  . DISCONTD: enoxaparin (LOVENOX) injection  30 mg Subcutaneous Q12H  . DISCONTD: ferrous sulfate   Oral Daily  . DISCONTD: glipiZIDE  5 mg Oral Once  . DISCONTD: insulin glargine  60 Units Subcutaneous QHS  . DISCONTD: metFORMIN  500 mg Oral BID WC   Continuous Infusions:   . lactated ringers 50 mL/hr at 01/03/12 0649   PRN Meds:.HYDROcodone-acetaminophen, HYDROmorphone (DILAUDID) injection, LORazepam, oxyCODONE-acetaminophen, DISCONTD: 0.9 % irrigation (POUR BTL), DISCONTD: bupivacaine, DISCONTD:  HYDROmorphone (DILAUDID) injection  Assessment/Plan: Diabetes Mellitus, type 2 - well controlled, resume home meds as pt eating and drinking well, follow up with PCP for further management  Hypertension - resume home medications,  monitor BP, treat pain effectively.  CKD -  stable Anemia - hemoglobin stable Post op care ORIF right wrist - post care per orthopedics, pt to return to rehab today for further care.     LOS: 1 day   Clanford Johnson 01/03/2012, 1:06 PM  Cleora Fleet, MD, CDE, FAAFP [facility] [place], [place]  [phone]

## 2012-01-03 NOTE — Progress Notes (Signed)
Subjective: 1 Day Post-Op Procedure(s) (LRB): OPEN REDUCTION INTERNAL FIXATION (ORIF) WRIST FRACTURE (Right) Patient reports pain as moderate.    Objective: Vital signs in last 24 hours: Temp:  [98 F (36.7 C)-98.6 F (37 C)] 98.6 F (37 C) (08/03 0547) Pulse Rate:  [63-72] 68  (08/03 0547) Resp:  [17-27] 18  (08/03 0547) BP: (143-175)/(49-86) 168/59 mmHg (08/03 0547) SpO2:  [95 %-100 %] 96 % (08/03 0547) FiO2 (%):  [100 %] 100 % (08/02 1641) Weight:  [133.811 kg (295 lb)] 133.811 kg (295 lb) (08/02 1637)  Intake/Output from previous day: 08/02 0701 - 08/03 0700 In: 1785.8 [I.V.:1735.8; IV Piggyback:50] Out: -  Intake/Output this shift:     Basename 01/03/12 0520  HGB 10.7*    Basename 01/03/12 0520  WBC 13.3*  RBC 3.60*  HCT 32.8*  PLT 490*    Basename 01/03/12 0520  NA 135  K 4.4  CL 98  CO2 26  BUN 24*  CREATININE 0.93  GLUCOSE 90  CALCIUM 9.9   No results found for this basename: LABPT:2,INR:2 in the last 72 hours  Neurologically intact  Assessment/Plan: 1 Day Post-Op Procedure(s) (LRB): OPEN REDUCTION INTERNAL FIXATION (ORIF) WRIST FRACTURE (Right) Discharge to SNF  [facility] 01/03/2012, 9:05 AM

## 2012-01-03 NOTE — Evaluation (Signed)
Physical Therapy Evaluation Patient Details Name: Katie Dawson MRN: [id number] DOB: 05/19/1934 Today's Date: 01/03/2012 Time: 4098-1191 PT Time Calculation (min): 31 min  PT Assessment / Plan / Recommendation Clinical Impression  Pt with previous admission 12/25/11 after sustaining a  R wrist fx and R fibular fx from fall at home.  Pt was d/c'd to SNF and return to [facility] for ORIF R wrist 01/02/12.  D/C plan is for return to SNF today.    PT Assessment  All further PT needs can be met in the next venue of care    Follow Up Recommendations  Skilled nursing facility    Barriers to Discharge Decreased caregiver support      Equipment Recommendations  Defer to next venue    Recommendations for Other Services     Frequency      Precautions / Restrictions Precautions Precautions: Fall Restrictions RUE Weight Bearing: Non weight bearing RLE Weight Bearing: Non weight bearing   Pertinent Vitals/Pain       Mobility  Bed Mobility Rolling Right: 2: Max assist;With rail Supine to Sit: 1: +2 Total assist;HOB elevated Supine to Sit: Patient Percentage: 30% Sitting - Scoot to Edge of Bed: 2: Max assist Sit to Supine: 1: +2 Total assist;HOB flat Sit to Supine: Patient Percentage: 20% Details for Bed Mobility Assistance: verbal cues for sequencing Transfers Transfers: Heritage manager Transfers: 1: +2 Total assist;With armrests Squat Pivot Transfers: Patient Percentage: 30% Details for Transfer Assistance: verbal cues for hand placement, sequencing, weight bearing status RUE/LE    Exercises     PT Diagnosis: Difficulty walking;Acute pain;Generalized weakness  PT Problem List: Decreased strength;Decreased activity tolerance;Decreased mobility;Pain;Obesity;Decreased knowledge of precautions PT Treatment Interventions:     PT Goals    Visit Information  Last PT Received On: 01/03/12 Assistance Needed: +2    Subjective Data  Subjective: "My arm really  hurts." Patient Stated Goal: home   Prior Functioning  Home Living Lives With: Spouse Available Help at Discharge: Family;Available 24 hours/day Type of Home: House Home Access: Stairs to enter [employer] of Steps: 4 Entrance Stairs-Rails: Right;Left;Can reach both Home Layout: Two level;Able to live on main level with bedroom/bathroom Bathroom Shower/Tub: Engineer, manufacturing systems: Standard Home Adaptive Equipment: Shower chair with back;Walker - rolling;Quad cane;Bedside commode/3-in-1;Wheelchair - manual Prior Function Level of Independence: Independent Able to Take Stairs?: Yes Driving: Yes Vocation: Retired Musician: No difficulties Dominant Hand: Right    Cognition  Overall Cognitive Status: Appears within functional limits for tasks assessed/performed Arousal/Alertness: Awake/alert Orientation Level: Appears intact for tasks assessed Behavior During Session: [facility] for tasks performed    Extremity/Trunk Assessment Right Upper Extremity Assessment RUE ROM/Strength/Tone: Deficits;Due to pain   Balance    End of Session PT - End of Session Equipment Utilized During Treatment: Gait belt Activity Tolerance: Patient limited by pain Patient left: in bed;with call bell/phone within reach Nurse Communication: Mobility status;Weight bearing status  GP Functional Limitation: Mobility: Walking and moving around Mobility: Walking and Moving Around Current Status [phone]): At least 80 percent but less than 100 percent impaired, limited or restricted Mobility: Walking and Moving Around Goal Status [phone]): At least 80 percent but less than 100 percent impaired, limited or restricted Mobility: Walking and Moving Around Discharge Status [phone]): At least 80 percent but less than 100 percent impaired, limited or restricted   Ilda Foil 01/03/2012, 11:59 AM  Aida Raider, PT  Office # [phone] Pager [phone]

## 2012-01-05 ENCOUNTER — Encounter (HOSPITAL_COMMUNITY): Payer: Self-pay | Admitting: Orthopedic Surgery

## 2012-01-05 LAB — GLUCOSE, CAPILLARY
Glucose-Capillary: 58 mg/dL — ABNORMAL LOW (ref 70–99)
Glucose-Capillary: 81 mg/dL (ref 70–99)
Glucose-Capillary: 170 mg/dL — ABNORMAL HIGH (ref 70–99)

## 2012-01-12 ENCOUNTER — Ambulatory Visit: Payer: Medicare HMO | Admitting: Internal Medicine

## 2012-01-12 DIAGNOSIS — Z0289 Encounter for other administrative examinations: Secondary | ICD-10-CM

## 2012-02-11 ENCOUNTER — Encounter: Payer: Self-pay | Admitting: Internal Medicine

## 2012-02-11 ENCOUNTER — Ambulatory Visit (INDEPENDENT_AMBULATORY_CARE_PROVIDER_SITE_OTHER): Payer: Medicare HMO | Admitting: Internal Medicine

## 2012-02-11 VITALS — BP 152/72 | HR 77 | Temp 98.0°F | Ht 65.0 in | Wt 260.0 lb

## 2012-02-11 DIAGNOSIS — D509 Iron deficiency anemia, unspecified: Secondary | ICD-10-CM

## 2012-02-11 DIAGNOSIS — E785 Hyperlipidemia, unspecified: Secondary | ICD-10-CM

## 2012-02-11 DIAGNOSIS — Z0000 Encounter for general adult medical examination without abnormal findings: Secondary | ICD-10-CM

## 2012-02-11 DIAGNOSIS — E538 Deficiency of other specified B group vitamins: Secondary | ICD-10-CM

## 2012-02-11 DIAGNOSIS — E119 Type 2 diabetes mellitus without complications: Secondary | ICD-10-CM

## 2012-02-11 DIAGNOSIS — I10 Essential (primary) hypertension: Secondary | ICD-10-CM

## 2012-02-11 MED ORDER — HYDROCODONE-ACETAMINOPHEN 5-500 MG PO CAPS: 1.0000 | ORAL_CAPSULE | Freq: Four times a day (QID) | ORAL | Status: DC | PRN

## 2012-02-11 MED ORDER — BENAZEPRIL HCL 40 MG PO TABS: 40.0000 mg | ORAL_TABLET | Freq: Two times a day (BID) | ORAL | Status: DC

## 2012-02-11 MED ORDER — PRAVASTATIN SODIUM 40 MG PO TABS: 40.0000 mg | ORAL_TABLET | Freq: Every day | ORAL | Status: DC

## 2012-02-11 MED ORDER — HYDROCHLOROTHIAZIDE 12.5 MG PO CAPS: 12.5000 mg | ORAL_CAPSULE | Freq: Every day | ORAL | Status: DC

## 2012-02-11 MED ORDER — GLUCOSE BLOOD VI STRP: ORAL_STRIP | Status: AC

## 2012-02-11 MED ORDER — LABETALOL HCL 200 MG PO TABS: 200.0000 mg | ORAL_TABLET | Freq: Two times a day (BID) | ORAL | Status: DC

## 2012-02-11 MED ORDER — METFORMIN HCL 500 MG PO TABS: 500.0000 mg | ORAL_TABLET | Freq: Two times a day (BID) | ORAL | Status: DC

## 2012-02-11 MED ORDER — ISOSORBIDE MONONITRATE ER 60 MG PO TB24: 60.0000 mg | ORAL_TABLET | Freq: Every day | ORAL | Status: AC

## 2012-02-11 MED ORDER — GLIPIZIDE ER 10 MG PO TB24: ORAL_TABLET | ORAL | Status: DC

## 2012-02-11 MED ORDER — INSULIN GLARGINE 100 UNIT/ML ~~LOC~~ SOLN: 60.0000 [IU] | Freq: Every day | SUBCUTANEOUS | Status: DC

## 2012-02-11 MED ORDER — ASPIRIN 81 MG PO TBEC: 81.0000 mg | DELAYED_RELEASE_TABLET | Freq: Every day | ORAL | Status: AC

## 2012-02-11 MED ORDER — AMLODIPINE BESYLATE 10 MG PO TABS: 10.0000 mg | ORAL_TABLET | Freq: Every day | ORAL | Status: DC

## 2012-02-11 MED ORDER — INSULIN PEN NEEDLE 32G X 4 MM MISC: 1.0000 "application " | Freq: Every day | Status: DC

## 2012-02-11 MED ORDER — LANCETS MISC: 1.0000 "application " | Freq: Every day | Status: DC

## 2012-02-11 MED ORDER — CLONIDINE HCL 0.1 MG PO TABS: 0.1000 mg | ORAL_TABLET | Freq: Two times a day (BID) | ORAL | Status: DC

## 2012-02-11 NOTE — Assessment & Plan Note (Signed)
For f/u lab next visit,  Lab Results  Component Value Date   WBC 13.3* 01/03/2012   HGB 10.7* 01/03/2012   HCT 32.8* 01/03/2012   MCV 91.1 01/03/2012   PLT 490* 01/03/2012

## 2012-02-11 NOTE — Assessment & Plan Note (Signed)
stable overall by hx and exam, most recent data reviewed with pt, and pt to continue medical treatment as before BP Readings from Last 3 Encounters:  02/11/12 152/72  01/03/12 168/59  01/03/12 168/59

## 2012-02-11 NOTE — Progress Notes (Signed)
Subjective:    Patient ID: Katie Dawson, female    DOB: 03-04-1934, 78 y.o.   MRN: [id number]  HPI  Here to f/u after being lost to f/u since June 2012, has had 2 hospns related to unfortunate fall with right wrist and fibular fractue;  Had normal a1c June 2012 but has become remarkably worse since then with last 11.2 approx 1 mo ago;  Pt has been started on lantus at 60 units per day, but on further questioning we find she only has had the treatment while hospd x 2 and during her recent post hosp rehab stay.  Has been home for 2 days and not taking insulin as she is thoroughly confused as she has never been taught about insulin administration, or how to check cbg;s.  No prior DM education.  CBG 188 today.  Needs all med refills as well, including ongoing pain control.  Pt denies chest pain, increased sob or doe, wheezing, orthopnea, PND, increased LE swelling, palpitations, dizziness or syncope.   Pt denies polydipsia, polyuria, or low sugar symptoms such as weakness or confusion improved with po intake.  Pt denies new neurological symptoms such as new headache, or facial or extremity weakness or numbness Past Medical History  Diagnosis Date  . DIABETES MELLITUS, TYPE II 04/22/2007  . HYPERLIPIDEMIA 04/22/2007  . GOUT 04/22/2007  . ANEMIA-IRON DEFICIENCY 04/22/2007  . ANXIETY 04/22/2007  . OBSTRUCTIVE SLEEP APNEA 04/22/2007  . HYPERTENSION 04/22/2007  . GERD 04/22/2007  . RENAL INSUFFICIENCY 04/22/2007  . 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 FOOT PAIN, LEFT 12/23/2007  . OSTEOPENIA 02/12/2009  . INTERMITTENT VERTIGO 04/20/2009  . HYPERSOMNIA 01/11/2009  . COLONIC POLYPS, HX OF 04/22/2007  . NEPHRECTOMY, HX OF 04/22/2007  . Urinary tract infection     hx of   Past Surgical History  Procedure Date  . S/p right nephrectomy   . S/p parathyroid surgury   . S/p orif left wrist 01/02/2012  . Orif wrist fracture 01/02/2012    Procedure: OPEN REDUCTION INTERNAL FIXATION (ORIF) WRIST  FRACTURE;  Surgeon: Marlowe Shores, MD;  Location: [facility] OR;  Service: Orthopedics;  Laterality: Right;  Open Reduction Internal Fixation Right Distal Radius     reports that she has never smoked. She has never used smokeless tobacco. She reports that she does not drink alcohol or use illicit drugs. family history includes Heart disease in her mother. Allergies  Allergen Reactions  . Indomethacin     REACTION: renal insuff   Current Outpatient Prescriptions on File Prior to Visit  Medication Sig Dispense Refill  . Ferrous Sulfate (IRON) 325 (65 FE) MG TABS Take by mouth daily.        . pravastatin (PRAVACHOL) 40 MG tablet Take 1 tablet (40 mg total) by mouth daily.  90 tablet  3  . DISCONTD: amLODipine (NORVASC) 10 MG tablet Take 10 mg by mouth daily.        Marland Kitchen DISCONTD: benazepril (LOTENSIN) 40 MG tablet Take 1 tablet (40 mg total) by mouth 2 (two) times daily.  180 tablet  1  . DISCONTD: cloNIDine (CATAPRES) 0.1 MG tablet TAKE 1 TABLET TWICE DAILY.  60 tablet  9  . DISCONTD: ferrous sulfate 325 (65 FE) MG tablet Take 1 tablet (325 mg total) by mouth daily with breakfast.  30 tablet  0  . DISCONTD: GLUCOTROL XL 10 MG 24 hr tablet TAKE (2) TABLETS DAILY.  180 each  0  . DISCONTD: hydrochlorothiazide (MICROZIDE) 12.5 MG capsule Take  1 capsule (12.5 mg total) by mouth daily.      Marland Kitchen DISCONTD: labetalol (NORMODYNE) 200 MG tablet TAKE 1 TABLET TWICE DAILY.  60 tablet  0  . DISCONTD: metFORMIN (GLUCOPHAGE) 500 MG tablet Take 1 tablet (500 mg total) by mouth 2 (two) times daily with a meal.      . DISCONTD: insulin glargine (LANTUS) 100 UNIT/ML injection Inject 60 Units into the skin at bedtime.  10 mL     Review of Systems Review of Systems  Constitutional: Negative for diaphoresis and unexpected weight change.  HENT: Negative for tinnitus.   Eyes: Negative for photophobia and visual disturbance.  Respiratory: Negative for choking and stridor.   Gastrointestinal: Negative for vomiting and  blood in stool.  Genitourinary: Negative for hematuria and decreased urine volume.  Musculoskeletal: Negative for gait problem.  Skin: Negative for color change and wound.  Neurological: Negative for tremors and numbness. but has occasional dizziness Psychiatric/Behavioral: Negative for decreased concentration. The patient is not hyperactive.      Objective:   Physical Exam BP 152/72  Pulse 77  Temp 98 F (36.7 C) (Oral)  Ht 5\' 5"  (1.651 m)  Wt 260 lb (117.935 kg)  BMI 43.27 kg/m2  SpO2 98% Physical Exam  VS noted Constitutional: Pt appears well-developed and well-nourished. /morbid obese HENT: Head: Normocephalic.  Right Ear: External ear normal.  Left Ear: External ear normal.  Eyes: Conjunctivae and EOM are normal. Pupils are equal, round, and reactive to light.  Neck: Normal range of motion. Neck supple.  Cardiovascular: Normal rate and regular rhythm.   Pulmonary/Chest: Effort normal and breath sounds normal.  Abd:  Soft, NT, non-distended, + BS Neurological: Pt is alert. Not confused Skin: Skin is warm. No erythema. No rash Psychiatric: Pt behavior is normal. Thought content normal. 1+ nervous    Assessment & Plan:

## 2012-02-11 NOTE — Assessment & Plan Note (Signed)
?   Control, although on her med list for several months, pt states was taking lantus only when given to her in the hospital, not sure how to admin; none now for 2 days since home from post hospn rehab - for education on admin today, and new rx or lantus solostar, needles; f/u 1 month Lab Results  Component Value Date   HGBA1C 11.2* 01/02/2012

## 2012-02-11 NOTE — Patient Instructions (Addendum)
Your blood sugar was 188 today You should re-start the lantus today - the nurse should show how to give today You will be contacted regarding the referral for: Diabetes education class Please start Aspirin 81 mg - 1 per day - COATED only Your medication list should be correct today All of your medications were refilled today; Continue all other medications as before You had the flu shot today Please check your blood sugar ONCE per day, but you can vary the time, such as before breakfast, lunch, dinner and bedtime; please write down your sugar levels and bring with you to your next visit Please return in 1 mo with Lab testing done 3-5 days before - to include the A1c and anemia tests

## 2012-02-11 NOTE — Assessment & Plan Note (Signed)
stable overall by hx and exam, most recent data reviewed with pt, and pt to continue medical treatment as before Lab Results  Component Value Date   [facility]  Value: 64        Total Cholesterol/HDL:CHD Risk Coronary Heart Disease Risk Table                     Men   Women  1/2 Average Risk   3.4   3.3  Average Risk       5.0   4.4  2 X Average Risk   9.6   7.1  3 X Average Risk  23.4   11.0        Use the calculated Patient Ratio above and the CHD Risk Table to determine the patient's CHD Risk.        ATP III CLASSIFICATION (LDL):  <100     mg/dL   Optimal  161-096  mg/dL   Near or Above                    Optimal  130-159  mg/dL   Borderline  045-409  mg/dL   High  >811     mg/dL   Very High 01/31/4781

## 2012-02-16 ENCOUNTER — Telehealth: Payer: Self-pay

## 2012-02-16 MED ORDER — HYDROCHLOROTHIAZIDE 12.5 MG PO CAPS: 12.5000 mg | ORAL_CAPSULE | Freq: Every day | ORAL | Status: DC

## 2012-02-16 MED ORDER — METFORMIN HCL 500 MG PO TABS: 500.0000 mg | ORAL_TABLET | Freq: Two times a day (BID) | ORAL | Status: DC

## 2012-02-16 NOTE — Telephone Encounter (Signed)
Refill done per emr 

## 2012-02-16 NOTE — Telephone Encounter (Signed)
Pharmacy requesting a prescription for HCTZ if you want her to continue as was on since returning from rehab.  Advise as [facility] needs rx.

## 2012-02-20 ENCOUNTER — Telehealth: Payer: Self-pay

## 2012-02-20 NOTE — Telephone Encounter (Signed)
Called HHRN left message to call back 

## 2012-02-20 NOTE — Telephone Encounter (Signed)
HHRN called stating pt has not received Rx for Metformin that was supposed to be sent to the pharmacy 09/16 but her fasting blood sugars have been 58-99 with Lantus 60 units QD and no Metformin, HHRN wanted to be sure MD wanted to continue pt's Metformin or perhaps decrease Lantus please advise.

## 2012-02-20 NOTE — Telephone Encounter (Signed)
Last a1c just one mo ago was > 11% (very high)  Ok to cont lantus at 55 units,  And stay off metformin for now, but needs to check CBG's qid (before meals and bedtime)  for 3 days and call with results  Robin to adjust med list

## 2012-02-20 NOTE — Telephone Encounter (Signed)
[facility] informed of MD results.

## 2012-02-23 ENCOUNTER — Telehealth: Payer: Self-pay | Admitting: Internal Medicine

## 2012-02-23 MED ORDER — INSULIN GLARGINE 100 UNIT/ML ~~LOC~~ SOLN: 55.0000 [IU] | Freq: Every day | SUBCUTANEOUS | Status: DC

## 2012-02-23 MED ORDER — METFORMIN HCL 500 MG PO TABS: 500.0000 mg | ORAL_TABLET | Freq: Two times a day (BID) | ORAL | Status: DC

## 2012-02-23 NOTE — Telephone Encounter (Signed)
Not sure why all the problem with pt getting and taking her lantus  lantus rx sent again

## 2012-02-23 NOTE — Telephone Encounter (Signed)
Caller: Lora/Sister-n-law; Patient Name: Katie Dawson; PCP: Oliver Barre (Adults only); Best Callback Phone Number: [phone]; Call regarding elevated Blood Sugar, 311 on 9-23. Caretaker gave remaining Levemir at time of call. Patient uses Levemir 60 units at bedtime, Patient has not used for last 2 nights due to thinking she was out.  Rechecked finger stick at 1500 = 250.  Blood sugar improving. Advised Caretaker to recheck finger stick at 1600 and follow up if not improving after increasing water intake. Per Epic, 02-20-12, Message was left with [facility] to follow up with Patient, Patient has not heard from [facility].  Per Epic, Lantus 55 units was ordered on 9-11, script not at Pharmacy, [place], [phone]. All emergent symptoms ruled out per Diabetes Control Protocol, see in 4 hours. Please review with MD, follow up with Patient if MD feels she should be seen, blood sugar is improving during traige after last of Levimir given, Lantus script needs to be sent to Pharmacy.

## 2012-02-24 ENCOUNTER — Telehealth: Payer: Self-pay

## 2012-02-24 MED ORDER — HYDROXYZINE HCL 25 MG PO TABS: ORAL_TABLET | ORAL | Status: DC

## 2012-02-24 NOTE — Telephone Encounter (Signed)
Family informed.

## 2012-02-24 NOTE — Telephone Encounter (Signed)
Fax from [facility] stating patient misplaced insulin she received on 02/16/12 from pharmacy and too early per insurance to fill, patient would like samples.  Also pharmacy stated patient needs rx for levemir flexpen.  Please advise

## 2012-02-24 NOTE — Telephone Encounter (Signed)
I would not feel comfortable with this for her (increased lorcet); but I would like to offer hydroxyzine for nerves  - done per emr; hopefully this would help

## 2012-02-24 NOTE — Telephone Encounter (Signed)
Called informed the pharmacy of instructions.

## 2012-02-24 NOTE — Telephone Encounter (Signed)
[facility] informed of all MD instructions for this patient.

## 2012-02-24 NOTE — Telephone Encounter (Signed)
Pharmacy requesting refill on patients hydrocodone, but stated in nursing home patient was taking 2 q 6 hrs. And still wants to take 2, please advise

## 2012-02-24 NOTE — Telephone Encounter (Signed)
I dont really understand the pt problem with her insulin (although I suspect the reason for problems may be cost of med that she is not stating)  She was seen sept 11, and left with a medication list including lantus  She has never been prescribed the levemir, although I think at one point she was given sample from the office  She does not need a prescription for levemir  OK with me for early refill of her lantus, though she should understand this may cause her to do this cashpay, which be unavoidable due to lack of samples of levemir or lantus today

## 2012-02-25 ENCOUNTER — Telehealth: Payer: Self-pay

## 2012-02-25 MED ORDER — BUSPIRONE HCL 7.5 MG PO TABS: 7.5000 mg | ORAL_TABLET | Freq: Three times a day (TID) | ORAL | Status: DC

## 2012-02-25 NOTE — Telephone Encounter (Signed)
Pharmacy fax stating patient copay for Hydroxyzine was $44.76, she cannot afford this can it be changed.

## 2012-02-25 NOTE — Telephone Encounter (Signed)
Ok for trial change to buspar 7.5 tid prn

## 2012-02-26 NOTE — Telephone Encounter (Signed)
Pharmacy informed and they will contact the patient and contact as well.

## 2012-03-05 ENCOUNTER — Ambulatory Visit: Payer: Medicare HMO | Admitting: Dietician

## 2012-03-08 ENCOUNTER — Other Ambulatory Visit: Payer: Self-pay | Admitting: Internal Medicine

## 2012-03-08 NOTE — Telephone Encounter (Signed)
Done hardcopy to [facility]  

## 2012-03-08 NOTE — Telephone Encounter (Signed)
Faxed hardcopy to pharmacy. 

## 2012-03-12 ENCOUNTER — Ambulatory Visit: Payer: Medicare HMO | Admitting: Internal Medicine

## 2012-03-12 DIAGNOSIS — Z0289 Encounter for other administrative examinations: Secondary | ICD-10-CM

## 2012-03-20 ENCOUNTER — Other Ambulatory Visit: Payer: Self-pay | Admitting: Internal Medicine

## 2012-04-05 ENCOUNTER — Ambulatory Visit: Payer: Medicare HMO | Admitting: Internal Medicine

## 2012-04-08 ENCOUNTER — Other Ambulatory Visit (INDEPENDENT_AMBULATORY_CARE_PROVIDER_SITE_OTHER): Payer: Medicaid Other

## 2012-04-08 ENCOUNTER — Ambulatory Visit (INDEPENDENT_AMBULATORY_CARE_PROVIDER_SITE_OTHER): Payer: Medicaid Other | Admitting: Internal Medicine

## 2012-04-08 ENCOUNTER — Telehealth: Payer: Self-pay | Admitting: Internal Medicine

## 2012-04-08 ENCOUNTER — Encounter: Payer: Self-pay | Admitting: Internal Medicine

## 2012-04-08 VITALS — BP 142/80 | HR 63 | Temp 97.3°F | Ht 66.0 in | Wt 255.5 lb

## 2012-04-08 DIAGNOSIS — F29 Unspecified psychosis not due to a substance or known physiological condition: Secondary | ICD-10-CM

## 2012-04-08 DIAGNOSIS — M1710 Unilateral primary osteoarthritis, unspecified knee: Secondary | ICD-10-CM

## 2012-04-08 DIAGNOSIS — E119 Type 2 diabetes mellitus without complications: Secondary | ICD-10-CM

## 2012-04-08 DIAGNOSIS — E559 Vitamin D deficiency, unspecified: Secondary | ICD-10-CM

## 2012-04-08 DIAGNOSIS — R410 Disorientation, unspecified: Secondary | ICD-10-CM | POA: Insufficient documentation

## 2012-04-08 DIAGNOSIS — F329 Major depressive disorder, single episode, unspecified: Secondary | ICD-10-CM

## 2012-04-08 MED ORDER — CITALOPRAM HYDROBROMIDE 10 MG PO TABS: 10.0000 mg | ORAL_TABLET | Freq: Every day | ORAL | Status: DC

## 2012-04-08 MED ORDER — INSULIN GLARGINE 100 UNIT/ML ~~LOC~~ SOLN: 60.0000 [IU] | Freq: Every day | SUBCUTANEOUS | Status: DC

## 2012-04-08 NOTE — Telephone Encounter (Signed)
Pt has an appt today.  Her daughter called to let Dr. Jonny Ruiz know she has been having anger issues since her husband passed away.  Yells and curses people.  She is having memory problems.

## 2012-04-08 NOTE — Telephone Encounter (Signed)
noted 

## 2012-04-08 NOTE — Patient Instructions (Addendum)
Ok to stop the hydrocodone since it seems to make you "loopy" Please start Vitamin D (OTC) at 2000 units per day Please try the tramadol for pain instead, and Take all new medications as prescribed - the citalopram 10 per day for nerves Continue all other medications as before Please go to LAB in the Basement for the blood and/or urine tests to be done today You will be contacted by phone if any changes need to be made immediately.  Otherwise, you will receive a letter about your results with an explanation. Please remember to sign up for My Chart at your earliest convenience, as this will be important to you in the future with finding out test results. Please return in 3 weeks, or sooner if  needed

## 2012-04-08 NOTE — Progress Notes (Signed)
Subjective:    Patient ID: Katie Dawson, female    DOB: 1933/11/28, 78 y.o.   MRN: [id number]  HPI  Here to f/u with granddaughter present who helps with hx;  Pt has been more confused, agitated recently with some family friction, pt with loud outbursts and expletives, seems not as capable cognitive function, all of which pt denies or minimizesm, except does mention she gets "foggy" with taking the vicodin for pain.  Her Husband died recently while in NH, died in hosp, not sudden.  Family notes sent with her and phone call indicates pt with ? Memory problem (she attributes to pain medication - hydrocodone) but she cont' to take as it works well for pain;  Is now willing to try tramadol for now as she only takes one per day. Living with daughter, wants to go back to her home, but not clear if possible.  Pt states she feels she has friction with the family as she has "stayed too long."  Has had significant worsening depressive symptoms, but no suicidal ideation, or panic, though has ongoing anxiety as well.     Saw hand surgeon yesterday in f/u surgury, doing better, cast off, now for cont'd PT of distal RUE.  Famly remains supportive.  Did have recent vit d low per GYN, but pt does not take vit d Past Medical History  Diagnosis Date  . DIABETES MELLITUS, TYPE II 04/22/2007  . HYPERLIPIDEMIA 04/22/2007  . GOUT 04/22/2007  . ANEMIA-IRON DEFICIENCY 04/22/2007  . ANXIETY 04/22/2007  . OBSTRUCTIVE SLEEP APNEA 04/22/2007  . HYPERTENSION 04/22/2007  . GERD 04/22/2007  . RENAL INSUFFICIENCY 04/22/2007  . 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 FOOT PAIN, LEFT 12/23/2007  . OSTEOPENIA 02/12/2009  . INTERMITTENT VERTIGO 04/20/2009  . HYPERSOMNIA 01/11/2009  . COLONIC POLYPS, HX OF 04/22/2007  . NEPHRECTOMY, HX OF 04/22/2007  . Urinary tract infection     hx of   Past Surgical History  Procedure Date  . S/p right nephrectomy   . S/p parathyroid surgury   . S/p orif left wrist 01/02/2012  .  Orif wrist fracture 01/02/2012    Procedure: OPEN REDUCTION INTERNAL FIXATION (ORIF) WRIST FRACTURE;  Surgeon: Marlowe Shores, MD;  Location: [facility] OR;  Service: Orthopedics;  Laterality: Right;  Open Reduction Internal Fixation Right Distal Radius     reports that she has never smoked. She has never used smokeless tobacco. She reports that she does not drink alcohol or use illicit drugs. family history includes Heart disease in her mother. Allergies  Allergen Reactions  . Indomethacin     REACTION: renal insuff   Current Outpatient Prescriptions on File Prior to Visit  Medication Sig Dispense Refill  . amLODipine (NORVASC) 10 MG tablet Take 1 tablet (10 mg total) by mouth daily.  90 tablet  3  . aspirin 81 MG EC tablet Take 1 tablet (81 mg total) by mouth daily. Swallow whole.  30 tablet  12  . benazepril (LOTENSIN) 40 MG tablet Take 1 tablet (40 mg total) by mouth 2 (two) times daily.  180 tablet  3  . cloNIDine (CATAPRES) 0.1 MG tablet Take 1 tablet (0.1 mg total) by mouth 2 (two) times daily.  180 tablet  3  . Ferrous Sulfate (IRON) 325 (65 FE) MG TABS Take by mouth daily.        Marland Kitchen glipiZIDE (GLUCOTROL XL) 10 MG 24 hr tablet TAKE (2) TABLETS DAILY.  180 tablet  3  . glucose blood test  strip Use as instructed  100 each  12  . hydrocodone-acetaminophen (LORCET-HD) 5-500 MG per capsule Take 1 capsule by mouth every 6 (six) hours as needed.  60 capsule  1  . insulin glargine (LANTUS) 100 UNIT/ML injection Inject 60 Units into the skin at bedtime.  10 mL  11  . Insulin Pen Needle (BD PEN NEEDLE NANO U/F) 32G X 4 MM MISC 1 application by Does not apply route daily.  100 each  11  . isosorbide mononitrate (IMDUR) 60 MG 24 hr tablet Take 1 tablet (60 mg total) by mouth at bedtime.  90 tablet  3  . labetalol (NORMODYNE) 200 MG tablet Take 1 tablet (200 mg total) by mouth 2 (two) times daily.  180 tablet  3  . Lancets MISC 1 application by Does not apply route daily.  100 each  11  . LORTAB 5 5-500  MG per tablet TAKE ONE TABLET EVERY 6 HOURS AS NEEDED.  60 tablet  3  . metFORMIN (GLUCOPHAGE) 500 MG tablet Take 1 tablet (500 mg total) by mouth 2 (two) times daily with a meal.  180 tablet  3  . pravastatin (PRAVACHOL) 40 MG tablet Take 1 tablet (40 mg total) by mouth daily.  90 tablet  3  . citalopram (CELEXA) 10 MG tablet Take 1 tablet (10 mg total) by mouth daily.  90 tablet  3   Review of Systems  Constitutional: Negative for diaphoresis and unexpected weight change.  HENT: Negative for tinnitus.   Eyes: Negative for photophobia and visual disturbance.  Respiratory: Negative for choking and stridor.   Gastrointestinal: Negative for vomiting and blood in stool.  Genitourinary: Negative for hematuria and decreased urine volume.  Musculoskeletal: Negative for gait problem.  Skin: Negative for color change and wound.  Neurological: Negative for tremors and numbness.  Psychiatric/Behavioral: Negative for decreased concentration. The patient is not hyperactive.       Objective:   Physical Exam BP 142/80  Pulse 63  Temp 97.3 F (36.3 C) (Oral)  Ht 5\' 6"  (1.676 m)  Wt 255 lb 8 oz (115.894 kg)  BMI 41.24 kg/m2  SpO2 97% Physical Exam  VS noted Constitutional: Pt appears well-developed and well-nourished. Lavella Lemons HENT: Head: Normocephalic.  Right Ear: External ear normal.  Left Ear: External ear normal.  Eyes: Conjunctivae and EOM are normal. Pupils are equal, round, and reactive to light.  Neck: Normal range of motion. Neck supple.  Cardiovascular: Normal rate and regular rhythm.   Pulmonary/Chest: Effort normal and breath sounds normal.  Abd:  Soft, NT, non-distended, + BS Neurological: Pt is alert. Not confused - oriented x 3, motor intact Skin: Skin is warm. No erythema. No rash Psychiatric: Pt behavior is normal. Thought content normal. Not agitated, paranoid or hallucinations     Assessment & Plan:

## 2012-04-09 ENCOUNTER — Encounter: Payer: Self-pay | Admitting: Internal Medicine

## 2012-04-09 LAB — URINALYSIS, ROUTINE W REFLEX MICROSCOPIC
Ketones, ur: NEGATIVE
Specific Gravity, Urine: 1.025 (ref 1.000–1.030)
Total Protein, Urine: 30
Urine Glucose: NEGATIVE
Urobilinogen, UA: 0.2 (ref 0.0–1.0)
pH: 5.5 (ref 5.0–8.0)

## 2012-04-09 LAB — CBC WITH DIFFERENTIAL/PLATELET
Basophils Absolute: 0 10*3/uL (ref 0.0–0.1)
Basophils Relative: 0.3 % (ref 0.0–3.0)
HCT: 36.9 % (ref 36.0–46.0)
Hemoglobin: 12 g/dL (ref 12.0–15.0)
Lymphs Abs: 1.7 10*3/uL (ref 0.7–4.0)
Monocytes Relative: 5.6 % (ref 3.0–12.0)
Neutro Abs: 8.4 10*3/uL — ABNORMAL HIGH (ref 1.4–7.7)
RDW: 14.9 % — ABNORMAL HIGH (ref 11.5–14.6)

## 2012-04-09 LAB — BASIC METABOLIC PANEL
Calcium: 9.6 mg/dL (ref 8.4–10.5)
GFR: 70.12 mL/min (ref >60.00)
Glucose, Bld: 190 mg/dL — ABNORMAL HIGH (ref 70–99)
Sodium: 141 mEq/L (ref 135–145)

## 2012-04-09 LAB — HEPATIC FUNCTION PANEL
Albumin: 3.4 g/dL — ABNORMAL LOW (ref 3.5–5.2)
Alkaline Phosphatase: 65 U/L (ref 39–117)
Bilirubin, Direct: 0.1 mg/dL (ref 0.0–0.3)

## 2012-04-09 LAB — MICROALBUMIN / CREATININE URINE RATIO
Creatinine,U: 84.1 mg/dL
Microalb, Ur: 48.6 mg/dL — ABNORMAL HIGH (ref 0.0–1.9)

## 2012-04-09 LAB — LIPID PANEL
HDL: 26.9 mg/dL — ABNORMAL LOW (ref >39.00)
Total CHOL/HDL Ratio: 6
VLDL: 32.8 mg/dL (ref 0.0–40.0)

## 2012-04-09 LAB — HEMOGLOBIN A1C: Hgb A1c MFr Bld: 6.9 % — ABNORMAL HIGH (ref 4.6–6.5)

## 2012-04-10 ENCOUNTER — Encounter: Payer: Self-pay | Admitting: Internal Medicine

## 2012-04-10 DIAGNOSIS — F329 Major depressive disorder, single episode, unspecified: Secondary | ICD-10-CM | POA: Insufficient documentation

## 2012-04-10 DIAGNOSIS — E559 Vitamin D deficiency, unspecified: Secondary | ICD-10-CM | POA: Insufficient documentation

## 2012-04-10 DIAGNOSIS — F32A Depression, unspecified: Secondary | ICD-10-CM | POA: Insufficient documentation

## 2012-04-10 NOTE — Assessment & Plan Note (Signed)
For vit D 2000 units per day

## 2012-04-10 NOTE — Assessment & Plan Note (Signed)
With ongoing pain, to try change hydrocodone to tramadol prn,  to f/u any worsening symptoms or concerns

## 2012-04-10 NOTE — Assessment & Plan Note (Signed)
And recent grief;  For celexa 10 qd,  to f/u any worsening symptoms or concerns, verified nonsuicidal, declines counseling

## 2012-04-10 NOTE — Assessment & Plan Note (Addendum)
Unclear etiology, but suspect possible med related/vicodin - to d/c;  Also with recent husband death  - grief and depression may certainly be factors;  To also tx psych issues, cont family support, and to neurology if not improved  Note:  Total time for pt hx, exam, review of record with pt in the room, determination of diagnoses and plan for further eval and tx is > 40 min, with over 50% spent in coordination and counseling of patient

## 2012-04-10 NOTE — Assessment & Plan Note (Signed)
stable overall by hx and exam, most recent data reviewed with pt, and pt to continue medical treatment as before Lab Results  Component Value Date   HGBA1C 6.9* 04/08/2012

## 2012-05-04 ENCOUNTER — Ambulatory Visit: Payer: Medicare HMO | Admitting: Internal Medicine

## 2012-05-04 DIAGNOSIS — Z0289 Encounter for other administrative examinations: Secondary | ICD-10-CM

## 2012-06-07 ENCOUNTER — Telehealth: Payer: Self-pay

## 2012-06-07 MED ORDER — HYDROCODONE-ACETAMINOPHEN 5-325 MG PO TABS: 1.0000 | ORAL_TABLET | Freq: Four times a day (QID) | ORAL | Status: DC | PRN

## 2012-06-07 NOTE — Telephone Encounter (Signed)
Done hardcopy to [facility]  

## 2012-06-07 NOTE — Telephone Encounter (Signed)
Pharmacy fax to inform manufacturers are phasing out the hydrocodone 5/500 and they will need a new script for 5/325.  Please advise

## 2012-06-08 NOTE — Telephone Encounter (Signed)
Faxed hardcopy to pharmacy. 

## 2012-08-30 ENCOUNTER — Ambulatory Visit: Payer: Medicare HMO | Admitting: Internal Medicine

## 2012-08-30 DIAGNOSIS — Z0289 Encounter for other administrative examinations: Secondary | ICD-10-CM

## 2012-09-01 DIAGNOSIS — Z0279 Encounter for issue of other medical certificate: Secondary | ICD-10-CM

## 2012-09-10 ENCOUNTER — Ambulatory Visit: Payer: Medicare HMO | Admitting: Internal Medicine

## 2012-09-10 DIAGNOSIS — Z0289 Encounter for other administrative examinations: Secondary | ICD-10-CM

## 2012-09-15 ENCOUNTER — Telehealth: Payer: Self-pay

## 2012-09-15 NOTE — Telephone Encounter (Signed)
Faxed personal care service form to number on form on 09/10/12. [facility]. Called to followup.  Informed when form was faxed and faxed copy of form to their office at [phone] as well.  Called the contact person Simmie Davies to inform.

## 2012-10-06 ENCOUNTER — Emergency Department (HOSPITAL_COMMUNITY)
Admission: EM | Admit: 2012-10-06 | Discharge: 2012-10-06 | Disposition: A | Discharge status: Home / Self Care | Payer: PRIVATE HEALTH INSURANCE | Attending: Emergency Medicine | Admitting: Emergency Medicine

## 2012-10-06 ENCOUNTER — Emergency Department (HOSPITAL_COMMUNITY): Payer: PRIVATE HEALTH INSURANCE

## 2012-10-06 ENCOUNTER — Encounter (HOSPITAL_COMMUNITY): Payer: Self-pay | Admitting: *Deleted

## 2012-10-06 DIAGNOSIS — Z8639 Personal history of other endocrine, nutritional and metabolic disease: Secondary | ICD-10-CM | POA: Insufficient documentation

## 2012-10-06 DIAGNOSIS — Z8781 Personal history of (healed) traumatic fracture: Secondary | ICD-10-CM | POA: Insufficient documentation

## 2012-10-06 DIAGNOSIS — Z8669 Personal history of other diseases of the nervous system and sense organs: Secondary | ICD-10-CM | POA: Insufficient documentation

## 2012-10-06 DIAGNOSIS — R011 Cardiac murmur, unspecified: Secondary | ICD-10-CM | POA: Insufficient documentation

## 2012-10-06 DIAGNOSIS — IMO0002 Reserved for concepts with insufficient information to code with codable children: Secondary | ICD-10-CM | POA: Insufficient documentation

## 2012-10-06 DIAGNOSIS — R509 Fever, unspecified: Secondary | ICD-10-CM | POA: Insufficient documentation

## 2012-10-06 DIAGNOSIS — E119 Type 2 diabetes mellitus without complications: Secondary | ICD-10-CM | POA: Insufficient documentation

## 2012-10-06 DIAGNOSIS — Z860100 Personal history of colon polyps, unspecified: Secondary | ICD-10-CM | POA: Insufficient documentation

## 2012-10-06 DIAGNOSIS — G4733 Obstructive sleep apnea (adult) (pediatric): Secondary | ICD-10-CM | POA: Insufficient documentation

## 2012-10-06 DIAGNOSIS — M25569 Pain in unspecified knee: Secondary | ICD-10-CM | POA: Insufficient documentation

## 2012-10-06 DIAGNOSIS — M7989 Other specified soft tissue disorders: Secondary | ICD-10-CM | POA: Insufficient documentation

## 2012-10-06 DIAGNOSIS — Z79899 Other long term (current) drug therapy: Secondary | ICD-10-CM | POA: Insufficient documentation

## 2012-10-06 DIAGNOSIS — G8929 Other chronic pain: Secondary | ICD-10-CM | POA: Insufficient documentation

## 2012-10-06 DIAGNOSIS — Z87440 Personal history of urinary (tract) infections: Secondary | ICD-10-CM | POA: Insufficient documentation

## 2012-10-06 DIAGNOSIS — M25561 Pain in right knee: Secondary | ICD-10-CM

## 2012-10-06 DIAGNOSIS — Z862 Personal history of diseases of the blood and blood-forming organs and certain disorders involving the immune mechanism: Secondary | ICD-10-CM | POA: Insufficient documentation

## 2012-10-06 DIAGNOSIS — F411 Generalized anxiety disorder: Secondary | ICD-10-CM | POA: Insufficient documentation

## 2012-10-06 DIAGNOSIS — K219 Gastro-esophageal reflux disease without esophagitis: Secondary | ICD-10-CM | POA: Insufficient documentation

## 2012-10-06 DIAGNOSIS — M1710 Unilateral primary osteoarthritis, unspecified knee: Secondary | ICD-10-CM

## 2012-10-06 DIAGNOSIS — Z87448 Personal history of other diseases of urinary system: Secondary | ICD-10-CM | POA: Insufficient documentation

## 2012-10-06 DIAGNOSIS — Z8739 Personal history of other diseases of the musculoskeletal system and connective tissue: Secondary | ICD-10-CM | POA: Insufficient documentation

## 2012-10-06 DIAGNOSIS — Z794 Long term (current) use of insulin: Secondary | ICD-10-CM | POA: Insufficient documentation

## 2012-10-06 DIAGNOSIS — E785 Hyperlipidemia, unspecified: Secondary | ICD-10-CM | POA: Insufficient documentation

## 2012-10-06 DIAGNOSIS — E669 Obesity, unspecified: Secondary | ICD-10-CM | POA: Insufficient documentation

## 2012-10-06 DIAGNOSIS — Z905 Acquired absence of kidney: Secondary | ICD-10-CM | POA: Insufficient documentation

## 2012-10-06 DIAGNOSIS — D509 Iron deficiency anemia, unspecified: Secondary | ICD-10-CM | POA: Insufficient documentation

## 2012-10-06 DIAGNOSIS — F29 Unspecified psychosis not due to a substance or known physiological condition: Secondary | ICD-10-CM | POA: Insufficient documentation

## 2012-10-06 DIAGNOSIS — I10 Essential (primary) hypertension: Secondary | ICD-10-CM | POA: Insufficient documentation

## 2012-10-06 HISTORY — DX: Unspecified fracture of shaft of right fibula, initial encounter for closed fracture: S82.401A

## 2012-10-06 MED ORDER — IBUPROFEN 200 MG PO TABS
600.0000 mg | ORAL_TABLET | Freq: Once | ORAL | Status: AC
  Administered 2012-10-06: 600 mg via ORAL
  Filled 2012-10-06: qty 3

## 2012-10-06 MED ORDER — HYDROCODONE-ACETAMINOPHEN 5-325 MG PO TABS
1.0000 | ORAL_TABLET | Freq: Once | ORAL | Status: AC
  Administered 2012-10-06: 1 via ORAL
  Filled 2012-10-06: qty 1

## 2012-10-06 NOTE — ED Notes (Signed)
Per ems pt reports nontraumatic knee pain. Woke up with right knee hurt. Some swelling around right knee. Hx of fracture below right knee in Oct 2013. Pt reports she has seen pcp for it and has neuropathy. Hx of diabetes and circulation issues in right leg.

## 2012-10-06 NOTE — ED Provider Notes (Signed)
History     CSN: [id number]  Arrival date & time 10/06/12  0272   First MD Initiated Contact with Patient 10/06/12 [phone]     Chief Complaint  Patient presents with  . Knee Pain   (Consider location/radiation/quality/duration/timing/severity/associated sxs/prior treatment) HPI Comments: Katie Dawson is a 79 year old African American female with PMH of HTN, DM2, obesity, gout, OSA, confusion, s/p right wrist ORIF fx, s/p right fibular fracture, and B/L knee OA presenting to the ED today with complaints of right leg pain x2 days.  Katie Dawson claims for the past two days her right leg has been hurting more than usual to the point that she could not walk yesterday and this morning.  The pain is localized to her right knee and she also claims it is more swollen than normal.  She denies any trauma to the area but says she easily forgets things as well so she does not know.  Denies recently eating any red meat or drinking any alcohol and does not recall ever having gout.  One of her caretakers is present in the room this morning as well who says she last saw her on Friday and she was walking with her cane as usual.  However, this morning, she was told she was in a lot of pain and could not walk and that is why her family called the ambulance.  Katie Dawson explains that the pain is better this morning than yesterday and that her PCP office was closed this morning and she was still not able to put pressure on her right leg to walk.  She denies any recent illnesses, prior surgeries to the area but does claim to have hurt her right ankle and rist in August 2013.  Currently the pain has resolved but tender to palpation.  She reports feeling feverish at home with intermittent sweating but says that is normal for her and did not take her temperature.  Denies any chest pain, shortness of breath, N/V/D, abdominal pain, or any urinary complaints at this time.   Of note, in July 2013 she was admitted to the hospital s/p fall  and found to have right distal radius and fibular fracture.  She was seen by Dr. Victorino Dike at that time, cast was applied to right leg with 1 month of no weight bearing advised at that time.  s/p ORIF of right wrist 01/2012.    Patient is a 79 y.o. female presenting with knee pain. The history is provided by the patient and a caregiver. No language interpreter was used.  Knee Pain Location:  Knee (Right) Time since incident:  2 days Injury: no   Knee location:  R knee Pain details:    Quality:  Sharp   Radiates to:  L leg   Severity:  Moderate   Onset quality:  Gradual   Duration:  2 days   Timing:  Constant   Progression:  Waxing and waning Chronicity:  Chronic Dislocation: no   Foreign body present:  No foreign bodies Prior injury to area:  Unable to specify Relieved by:  Arthritis medications Worsened by:  Bearing weight and activity Ineffective treatments:  Rest Associated symptoms: swelling   Risk factors: obesity    Past Medical History  Diagnosis Date  . DIABETES MELLITUS, TYPE II 04/22/2007  . HYPERLIPIDEMIA 04/22/2007  . GOUT 04/22/2007  . ANEMIA-IRON DEFICIENCY 04/22/2007  . ANXIETY 04/22/2007  . OBSTRUCTIVE SLEEP APNEA 04/22/2007  . HYPERTENSION 04/22/2007  . GERD 04/22/2007  .  RENAL INSUFFICIENCY 04/22/2007  . 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 FOOT PAIN, LEFT 12/23/2007  . OSTEOPENIA 02/12/2009  . INTERMITTENT VERTIGO 04/20/2009  . HYPERSOMNIA 01/11/2009  . COLONIC POLYPS, HX OF 04/22/2007  . NEPHRECTOMY, HX OF 04/22/2007  . Urinary tract infection     hx of  . Right fibular fracture July 2013   Past Surgical History  Procedure Laterality Date  . S/p right nephrectomy    . S/p parathyroid surgury    . Orif wrist fracture  01/02/2012    Procedure: OPEN REDUCTION INTERNAL FIXATION (ORIF) WRIST FRACTURE;  Surgeon: Marlowe Shores, MD;  Location: [facility] OR;  Service: Orthopedics;  Laterality: Right;  Open Reduction Internal Fixation Right Distal Radius     Family History  Problem Relation Age of Onset  . Heart disease Mother    History  Substance Use Topics  . Smoking status: Never Smoker   . Smokeless tobacco: Never Used  . Alcohol Use: No   OB History   Grav Para Term Preterm Abortions TAB SAB Ect Mult Living                 Review of Systems  Constitutional: Negative.   HENT: Negative.   Eyes: Negative.   Respiratory: Negative.   Cardiovascular: Negative.   Gastrointestinal: Negative.   Endocrine: Negative.   Genitourinary: Negative.   Musculoskeletal: Positive for joint swelling, arthralgias and gait problem.  Skin: Negative.   Allergic/Immunologic: Negative.   Neurological: Negative.   Hematological: Negative.   Psychiatric/Behavioral: Positive for confusion.    Allergies  Indomethacin  Home Medications   Current Outpatient Rx  Name  Route  Sig  Dispense  Refill  . amLODipine (NORVASC) 10 MG tablet   Oral   Take 1 tablet (10 mg total) by mouth daily.   90 tablet   3   . aspirin 81 MG EC tablet   Oral   Take 1 tablet (81 mg total) by mouth daily. Swallow whole.   30 tablet   12   . benazepril (LOTENSIN) 40 MG tablet   Oral   Take 1 tablet (40 mg total) by mouth 2 (two) times daily.   180 tablet   3   . citalopram (CELEXA) 10 MG tablet   Oral   Take 1 tablet (10 mg total) by mouth daily.   90 tablet   3   . cloNIDine (CATAPRES) 0.1 MG tablet   Oral   Take 1 tablet (0.1 mg total) by mouth 2 (two) times daily.   180 tablet   3   . Ferrous Sulfate (IRON) 325 (65 FE) MG TABS   Oral   Take by mouth daily.           Marland Kitchen glipiZIDE (GLUCOTROL XL) 10 MG 24 hr tablet      TAKE (2) TABLETS DAILY.   180 tablet   3   . glucose blood test strip      Use as instructed   100 each   12   . HYDROcodone-acetaminophen (NORCO/VICODIN) 5-325 MG per tablet   Oral   Take 1 tablet by mouth every 6 (six) hours as needed for pain.   60 tablet   1   . insulin glargine (LANTUS) 100 UNIT/ML  injection   Subcutaneous   Inject 60 Units into the skin at bedtime.   10 mL   11   . Insulin Pen Needle (BD PEN NEEDLE NANO U/F) 32G X 4 MM MISC  Does not apply   1 application by Does not apply route daily.   100 each   11   . isosorbide mononitrate (IMDUR) 60 MG 24 hr tablet   Oral   Take 1 tablet (60 mg total) by mouth at bedtime.   90 tablet   3   . labetalol (NORMODYNE) 200 MG tablet   Oral   Take 1 tablet (200 mg total) by mouth 2 (two) times daily.   180 tablet   3   . Lancets MISC   Does not apply   1 application by Does not apply route daily.   100 each   11   . metFORMIN (GLUCOPHAGE) 500 MG tablet   Oral   Take 1 tablet (500 mg total) by mouth 2 (two) times daily with a meal.   180 tablet   3   . pravastatin (PRAVACHOL) 40 MG tablet   Oral   Take 1 tablet (40 mg total) by mouth daily.   90 tablet   3    BP 179/55  Pulse 80  Temp(Src) 97.8 F (36.6 C) (Oral)  Resp 16  SpO2 95%  Physical Exam  Constitutional: She is oriented to person, place, and time. She appears well-developed and well-nourished.  obese  HENT:  Head: Normocephalic and atraumatic.  Eyes: Conjunctivae and EOM are normal. Pupils are equal, round, and reactive to light.  Neck: Normal range of motion. Neck supple.  Cardiovascular: Normal rate, regular rhythm and intact distal pulses.   Murmur heard. Loudest at upper sternal borders  Pulmonary/Chest: Effort normal and breath sounds normal. She has no wheezes.  Abdominal: Soft. Bowel sounds are normal. There is no tenderness.  obese  Musculoskeletal: She exhibits edema and tenderness.  Anterior right knee tenderness to palpation, +mild edema, limited mobility of RLE compared to left.  Able to raise right leg with complaints of pain at right knee.  Limited flexion at right knee due to pain.  Non erythematous.   Neurological: She is alert and oriented to person, place, and time. No cranial nerve deficit.  Skin: Skin is warm and  dry.  Psychiatric: She has a normal mood and affect. Her behavior is normal.  Intermittent confusion at baseline   ED Course  Procedures (including critical care time)  Labs Reviewed  GLUCOSE, CAPILLARY - Abnormal; Notable for the following:    Glucose-Capillary 61 (*)    All other components within normal limits   Dg Knee Complete 4 Views Right  10/06/2012  *RADIOLOGY REPORT*  Clinical Data: Right knee pain.  RIGHT KNEE - COMPLETE 4+ VIEW  Comparison: 12/24/2011  Findings: Proximal fibular fracture has healed since the prior exam.  There is some progression of degenerative osteoarthritis of the knee which most severely affects the lateral compartment. Reactive sclerosis and subchondral cystic changes of the lateral femoral condyle are more pronounced by x-ray.  There also is new calcification paralleling the medial femoral condyle and likely related to previous injury to the medial collateral ligament.  No acute fracture or significant joint fluid is identified with probable component of some amount of suprapatellar joint fluid.  IMPRESSION:  1.  Interval healing of proximal fibular fractures seen previously. 2.  Progression of degenerative changes, particularly affecting the lateral joint space. 3.  New calcification adjacent to the medial femoral condyle and likely reflecting previous injury to the medial collateral ligament.   Original Report Authenticated By: Irish Lack, M.D.     No diagnosis found.   MDM  Katie Dawson is a 79 year old obese female with HTN and DM2 presenting to the ED with complaints of right leg pain x2 days.  R leg pain--localized mainly to right knee.  Tender to palpation, limited ROM. Denies trauma to area.  Suspect more non-traumatic (denies trauma to area), intrinsic and structural in etiology.  Less likely inflammatory based on presentation, afebrile, no erythema or warmth at site. Could be worsening of OA vs gout (document history) vs. Dislocation or meniscal  injury.  Hx of prior fibular fracture treated conservatively in 2013.  Xray of right knee at that time in 2013 concerning for osteonecrosis or bony infarct along with tricompartment degenerative changes and small suprapatellar joint effusion.  Has seen Dr. Victorino Dike in the past.   -right knee xray--Interval healing of prior proximal fibular fractures. Progression of degenerative changes, particularly affecting the lateral joint space. New calcification adjacent to the medial femoral condyle and likely reflecting previous injury to the medial collateral ligament. -knee immobilizer -pain control prn, has vicodin at home -follow up with pcp and Dr. Victorino Dike (ortho) as soon as possible  Discussed with Dr. Judeth Horn, MD 10/06/12 1131  I performed a history and physical examination of  JACKLYN BRANAN and discussed her management with Dr. Virgina Organ. I agree with the history, physical, assessment, and plan of care, with the following exceptions: None I was present for the following procedures: None  Time Spent in Critical Care of the patient: None  Time spent in discussions with the patient and family: 5 minutes  Katie Dawson  Derwood Kaplan, MD 10/06/12 1635

## 2012-10-06 NOTE — ED Notes (Signed)
[id number]<AV> Expected date:<BR> Expected time:<BR> Means of arrival:<BR> Comments:<BR> ems- right knee pain

## 2012-10-06 NOTE — ED Notes (Signed)
Ortho called, will be down shortly

## 2012-10-06 NOTE — Discharge Instructions (Signed)
Knee Pain Knee pain can be a result of an injury or other medical conditions. Treatment will depend on the cause of your pain. HOME CARE  Only take medicine as told by your doctor.  Keep a healthy weight. Being overweight can make the knee hurt more.  Stretch before exercising or playing sports.  If there is constant knee pain, change the way you exercise. Ask your doctor for advice.  Make sure shoes fit well. Choose the right shoe for the sport or activity.  Protect your knees. Wear kneepads if needed.  Rest when you are tired. GET HELP RIGHT AWAY IF:   Your knee pain does not stop.  Your knee pain does not get better.  Your knee joint feels hot to the touch.  You have a temperature by mouth above 102 F (38.9 C), not controlled by medicine.  Your baby is older than 3 months with a rectal temperature of 102 F (38.9 C) or higher.  Your baby is 15 months old or younger with a rectal temperature of 100.4 F (38 C) or higher. MAKE SURE YOU:   Understand these instructions.  Will watch this condition.  Will get help right away if you are not doing well or get worse. Document Released: 08/15/2008 Document Revised: 08/11/2011 Document Reviewed: 08/15/2008 [facility] Patient Information 2013 [place], [place]. Knee Immobilization You have been prescribed a knee immobilizer. This is used to support and protect an injured or painful knee. Knee immobilizers keep your knee from being used while it is healing. Some of the common immobilizers used include splints (air, plaster, fiberglass or aluminum) or casts. Wear your knee immobilizer as instructed and only remove it as instructed. If the immobilizer is used to protect broken bones, torn ligaments or torn cartilage, it may be 4 to 6 weeks before you are able to begin rehabilitating your knee, and it may be longer than that before you are able to return to athletic activity. HOME CARE INSTRUCTIONS   Use powder to control irritation  from sweat and friction.  Adjust the immobilizer to be firm but not tight. Signs of an immobilizer that is too tight include:  Swelling.  Numbness.  Color change in your foot or ankle.  Increased pain.  While resting, raise your leg above the level of your heart. This reduces throbbing and helps healing. Prop it up with pillows.  Remove the immobilizer to bathe and sleep. Wear it other times until you see your doctor again. When your splint or cast is removed:   Stretching and strengthening are important in caring for and preventing knee injuries. If your knee begins to get sore while you are conditioning, decrease or back off your activities until you no longer have discomfort. Then gradually resume your activities.  When strengthening your knee, increase your activities a little at a time so as not to develop injuries from over use.  Work out an exercise plan with your caregiver or physical therapist to get the best program for you. SEEK IMMEDIATE MEDICAL CARE IF:   Your knee seems to be getting worse rather than better.  You have increasing pain or swelling in the knee, foot, or ankle.  You have problems caused by the knee immobilizer or it breaks or needs replacement.  You have increased swelling or redness or soreness (inflammation) in your knee.  Your leg becomes warm and more painful.  You develop an unexplained temperature over 102 F (38.9 C). MAKE SURE YOU:   Understand these instructions.  Will watch your condition.  Will get help right away if you are not doing well or get worse. Document Released: 05/19/2005 Document Revised: 08/11/2011 Document Reviewed: 11/04/2006 [facility] Patient Information 2013 [place], [place].

## 2012-11-09 ENCOUNTER — Telehealth: Payer: Self-pay | Admitting: Internal Medicine

## 2012-11-09 MED ORDER — INSULIN GLARGINE 100 UNIT/ML ~~LOC~~ SOLN: 60.0000 [IU] | Freq: Every day | SUBCUTANEOUS | Status: DC

## 2012-11-09 NOTE — Telephone Encounter (Signed)
Sonya Gwynne called Triage states refill for Lantus would not be filled by Pharmacy until 6.13.14.

## 2012-11-09 NOTE — Telephone Encounter (Signed)
Ok - to robin to handle 

## 2012-11-09 NOTE — Telephone Encounter (Signed)
Called pt could not leave message however,  Rx has been sent to [facility].

## 2012-11-09 NOTE — Telephone Encounter (Signed)
Katie Dawson called triage, states pt lost her insulin a week ago and needs a refill.  Please advise

## 2012-11-09 NOTE — Telephone Encounter (Signed)
Spoke with Katie Dawson advised her to come to clinic before 5pm to receive sample until refill date.

## 2012-11-14 ENCOUNTER — Other Ambulatory Visit: Payer: Self-pay | Admitting: Internal Medicine

## 2012-11-15 ENCOUNTER — Other Ambulatory Visit: Payer: Self-pay

## 2012-11-15 MED ORDER — HYDROCODONE-ACETAMINOPHEN 5-325 MG PO TABS: 1.0000 | ORAL_TABLET | Freq: Four times a day (QID) | ORAL | Status: DC | PRN

## 2012-11-15 NOTE — Telephone Encounter (Signed)
Done hardcopy to [facility]  

## 2012-11-15 NOTE — Telephone Encounter (Signed)
Faxed hardcopy to [place] 

## 2013-01-30 ENCOUNTER — Other Ambulatory Visit: Payer: Self-pay | Admitting: Internal Medicine

## 2013-02-04 ENCOUNTER — Telehealth: Payer: Self-pay | Admitting: Internal Medicine

## 2013-02-04 NOTE — Telephone Encounter (Signed)
The daughter called to inform patients dementia has worsened.  Informed she would write down things to inform the MD of for next OV February 10, 2013 and give to [facility] before seeing the MD

## 2013-02-04 NOTE — Telephone Encounter (Signed)
Request to speak to nurse about pt, states her mental status is getting worse

## 2013-02-04 NOTE — Telephone Encounter (Signed)
Called left message to call back 

## 2013-02-10 ENCOUNTER — Ambulatory Visit (INDEPENDENT_AMBULATORY_CARE_PROVIDER_SITE_OTHER): Payer: Medicare HMO | Admitting: Internal Medicine

## 2013-02-10 ENCOUNTER — Other Ambulatory Visit (INDEPENDENT_AMBULATORY_CARE_PROVIDER_SITE_OTHER): Payer: Medicare HMO

## 2013-02-10 ENCOUNTER — Encounter: Payer: Self-pay | Admitting: Internal Medicine

## 2013-02-10 VITALS — BP 144/70 | HR 75 | Temp 98.4°F | Ht 65.5 in | Wt 260.5 lb

## 2013-02-10 DIAGNOSIS — D509 Iron deficiency anemia, unspecified: Secondary | ICD-10-CM

## 2013-02-10 DIAGNOSIS — I10 Essential (primary) hypertension: Secondary | ICD-10-CM

## 2013-02-10 DIAGNOSIS — F0390 Unspecified dementia without behavioral disturbance: Secondary | ICD-10-CM

## 2013-02-10 DIAGNOSIS — E119 Type 2 diabetes mellitus without complications: Secondary | ICD-10-CM

## 2013-02-10 DIAGNOSIS — R413 Other amnesia: Secondary | ICD-10-CM

## 2013-02-10 DIAGNOSIS — I359 Nonrheumatic aortic valve disorder, unspecified: Secondary | ICD-10-CM

## 2013-02-10 DIAGNOSIS — E538 Deficiency of other specified B group vitamins: Secondary | ICD-10-CM

## 2013-02-10 DIAGNOSIS — I350 Nonrheumatic aortic (valve) stenosis: Secondary | ICD-10-CM

## 2013-02-10 LAB — BASIC METABOLIC PANEL
CO2: 24 mEq/L (ref 19–32)
Calcium: 9.9 mg/dL (ref 8.4–10.5)
Sodium: 142 mEq/L (ref 135–145)

## 2013-02-10 LAB — URINALYSIS, ROUTINE W REFLEX MICROSCOPIC
Bilirubin Urine: NEGATIVE
Nitrite: NEGATIVE
RBC / HPF: NONE SEEN (ref 0–?)
Specific Gravity, Urine: 1.025 (ref 1.000–1.030)
pH: 5.5 (ref 5.0–8.0)

## 2013-02-10 LAB — TSH: TSH: 2.24 u[IU]/mL (ref 0.35–5.50)

## 2013-02-10 LAB — CBC WITH DIFFERENTIAL/PLATELET
Basophils Relative: 0.3 % (ref 0.0–3.0)
Eosinophils Relative: 2.8 % (ref 0.0–5.0)
MCV: 87.2 fl (ref 78.0–100.0)
Monocytes Relative: 5.6 % (ref 3.0–12.0)
Neutrophils Relative %: 76.5 % (ref 43.0–77.0)
Platelets: 288 10*3/uL (ref 150.0–400.0)
RBC: 4.26 Mil/uL (ref 3.87–5.11)
WBC: 12.8 10*3/uL — ABNORMAL HIGH (ref 4.5–10.5)

## 2013-02-10 LAB — HEPATIC FUNCTION PANEL
ALT: 20 U/L (ref 0–35)
Albumin: 3.6 g/dL (ref 3.5–5.2)
Alkaline Phosphatase: 65 U/L (ref 39–117)
Bilirubin, Direct: 0.1 mg/dL (ref 0.0–0.3)

## 2013-02-10 LAB — LIPID PANEL
HDL: 30.1 mg/dL — ABNORMAL LOW (ref >39.00)
Total CHOL/HDL Ratio: 6

## 2013-02-10 LAB — IBC PANEL: Saturation Ratios: 19.1 % — ABNORMAL LOW (ref 20.0–50.0)

## 2013-02-10 LAB — MICROALBUMIN / CREATININE URINE RATIO
Creatinine,U: 126.2 mg/dL
Microalb Creat Ratio: 16.4 mg/g (ref 0.0–30.0)
Microalb, Ur: 20.7 mg/dL — ABNORMAL HIGH (ref 0.0–1.9)

## 2013-02-10 LAB — VITAMIN B12: Vitamin B-12: 308 pg/mL (ref 211–911)

## 2013-02-10 MED ORDER — INSULIN GLARGINE 100 UNIT/ML ~~LOC~~ SOLN: 60.0000 [IU] | Freq: Every day | SUBCUTANEOUS | Status: DC

## 2013-02-10 MED ORDER — DONEPEZIL HCL 5 MG PO TABS: 5.0000 mg | ORAL_TABLET | Freq: Every evening | ORAL | Status: DC | PRN

## 2013-02-10 NOTE — Assessment & Plan Note (Signed)
?   Control, but ok to d/c the glipizide for safetly, cont the lantus , metformin, for labs today

## 2013-02-10 NOTE — Assessment & Plan Note (Addendum)
prob alzheimers type, for eval since new with Head MRI, labs, start aricept 5, declines neurology referral for now  Note:  Total time for pt hx, exam, review of record with pt in the room, determination of diagnoses and plan for further eval and tx is > 40 min, with over 50% spent in coordination and counseling of patient

## 2013-02-10 NOTE — Assessment & Plan Note (Signed)
With louder murmur, last echo 2010, for f/u echo today, has recurrent dizziness not clear if related

## 2013-02-10 NOTE — Progress Notes (Signed)
Subjective:    Patient ID: Katie Dawson, female    DOB: 10/11/1933, 79 y.o.   MRN: [id number]  HPI  Here to f/u; overall doing ok,  Pt denies chest pain, increased sob or doe, wheezing, orthopnea, PND, increased LE swelling, palpitations, or syncope, though has recurring dizziness  Pt denies polydipsia, polyuria, or low sugar symptoms such as weakness or confusion improved with po intake.  Pt denies new neurological symptoms such as new headache, or facial or extremity weakness or numbness.   Pt states overall good compliance with meds, has been trying to follow lower cholesterol, diabetic diet, with wt overall stable,  but little exercise however.  Daughter report 6 mo worsening new onsaet memory loss, cant understand meds or take them onhe own now, cannot recall if she just had an insulin shot, and cant remember events from the day before that she should, and prior had been not a problem.  Daughter requests med review and stop any if not needed, asks for memory tx, and pt now lives with daughter Past Medical History  Diagnosis Date  . DIABETES MELLITUS, TYPE II 04/22/2007  . HYPERLIPIDEMIA 04/22/2007  . GOUT 04/22/2007  . ANEMIA-IRON DEFICIENCY 04/22/2007  . ANXIETY 04/22/2007  . OBSTRUCTIVE SLEEP APNEA 04/22/2007  . HYPERTENSION 04/22/2007  . GERD 04/22/2007  . RENAL INSUFFICIENCY 04/22/2007  . 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 FOOT PAIN, LEFT 12/23/2007  . OSTEOPENIA 02/12/2009  . INTERMITTENT VERTIGO 04/20/2009  . HYPERSOMNIA 01/11/2009  . COLONIC POLYPS, HX OF 04/22/2007  . NEPHRECTOMY, HX OF 04/22/2007  . Urinary tract infection     hx of  . Right fibular fracture July 2013   Past Surgical History  Procedure Laterality Date  . S/p right nephrectomy    . S/p parathyroid surgury    . Orif wrist fracture  01/02/2012    Procedure: OPEN REDUCTION INTERNAL FIXATION (ORIF) WRIST FRACTURE;  Surgeon: Marlowe Shores, MD;  Location: [facility] OR;  Service: Orthopedics;   Laterality: Right;  Open Reduction Internal Fixation Right Distal Radius     reports that she has never smoked. She has never used smokeless tobacco. She reports that she does not drink alcohol or use illicit drugs. family history includes Heart disease in her mother. Allergies  Allergen Reactions  . Indomethacin     REACTION: renal insuff   Current Outpatient Prescriptions on File Prior to Visit  Medication Sig Dispense Refill  . aspirin 81 MG EC tablet Take 1 tablet (81 mg total) by mouth daily. Swallow whole.  30 tablet  12  . benazepril (LOTENSIN) 40 MG tablet Take 1 tablet (40 mg total) by mouth 2 (two) times daily.  180 tablet  3  . busPIRone (BUSPAR) 7.5 MG tablet TAKE 1 TABLET THREE TIMES DAILY AS NEEDED FOR NERVES.  90 tablet  1  . citalopram (CELEXA) 10 MG tablet Take 1 tablet (10 mg total) by mouth daily.  90 tablet  3  . cloNIDine (CATAPRES) 0.1 MG tablet Take 1 tablet (0.1 mg total) by mouth 2 (two) times daily.  180 tablet  3  . Ferrous Sulfate (IRON) 325 (65 FE) MG TABS Take by mouth daily.        Marland Kitchen glipiZIDE (GLUCOTROL XL) 10 MG 24 hr tablet TAKE (2) TABLETS DAILY.  180 tablet  3  . glucose blood test strip Use as instructed  100 each  12  . hydrochlorothiazide (MICROZIDE) 12.5 MG capsule TAKE (1) CAPSULE DAILY.  90 capsule  0  .  HYDROcodone-acetaminophen (NORCO/VICODIN) 5-325 MG per tablet Take 1 tablet by mouth every 6 (six) hours as needed for pain.  60 tablet  1  . insulin glargine (LANTUS) 100 UNIT/ML injection Inject 0.6 mLs (60 Units total) into the skin at bedtime.  10 mL  7  . Insulin Pen Needle (BD PEN NEEDLE NANO U/F) 32G X 4 MM MISC 1 application by Does not apply route daily.  100 each  11  . isosorbide mononitrate (IMDUR) 60 MG 24 hr tablet Take 1 tablet (60 mg total) by mouth at bedtime.  90 tablet  3  . labetalol (NORMODYNE) 200 MG tablet Take 1 tablet (200 mg total) by mouth 2 (two) times daily.  180 tablet  3  . Lancets MISC 1 application by Does not apply  route daily.  100 each  11  . metFORMIN (GLUCOPHAGE) 500 MG tablet Take 1 tablet (500 mg total) by mouth 2 (two) times daily with a meal.  180 tablet  3  . pravastatin (PRAVACHOL) 40 MG tablet Take 1 tablet (40 mg total) by mouth daily.  90 tablet  3  . amLODipine (NORVASC) 10 MG tablet Take 1 tablet (10 mg total) by mouth daily.  90 tablet  3   No current facility-administered medications on file prior to visit.   Review of Systems  Constitutional: Negative for unexpected weight change, or unusual diaphoresis  HENT: Negative for tinnitus.   Eyes: Negative for photophobia and visual disturbance.  Respiratory: Negative for choking and stridor.   Gastrointestinal: Negative for vomiting and blood in stool.  Genitourinary: Negative for hematuria and decreased urine volume.  Musculoskeletal: Negative for acute joint swelling Skin: Negative for color change and wound.  Neurological: Negative for tremors and numbness other than noted  Psychiatric/Behavioral: Negative for decreased concentration or  hyperactivity.       Objective:   Physical Exam BP 144/70  Pulse 75  Temp(Src) 98.4 F (36.9 C) (Oral)  Ht 5' 5.5" (1.664 m)  Wt 260 lb 8 oz (118.162 kg)  BMI 42.67 kg/m2  SpO2 95% VS noted,  Constitutional: Pt appears well-developed and well-nourished.  HENT: Head: NCAT.  Right Ear: External ear normal.  Left Ear: External ear normal.  Eyes: Conjunctivae and EOM are normal. Pupils are equal, round, and reactive to light.  Neck: Normal range of motion. Neck supple.  Cardiovascular: Normal rate and regular rhythm.   Pulmonary/Chest: Effort normal and breath sounds normal.  Abd:  Soft, NT, non-distended, + BS Neurological: Pt is alert. Confused, oriented to name, motor 5/5 Skin: Skin is warm. No erythema. No LE edema Psychiatric: Pt behavior is normal. Not depressed affect.     Assessment & Plan:

## 2013-02-10 NOTE — Assessment & Plan Note (Signed)
stable overall by history and exam, recent data reviewed with pt, and pt to continue medical treatment as before,  to f/u any worsening symptoms or concerns BP Readings from Last 3 Encounters:  02/10/13 144/70  10/06/12 156/82  04/08/12 142/80

## 2013-02-10 NOTE — Patient Instructions (Addendum)
Please remember to followup with your GYN for the yearly pap smear and/or mammogram, such as at [facility] on [address], or [facility] on [address] Please continue your efforts at being more active, low cholesterol diet, and weight control. You are otherwise up to date with prevention measures today.  OK to stop the glipizide  Please take all new medication as prescribed  - the aricept (generic) at 5 mg per day  Please continue all other medications as before, and refills have been done if requested. Please have the pharmacy call with any other refills you may need.  You will be contacted regarding the referral for: Head MRI, and Echocardiogram  Please go to the LAB in the Basement (turn left off the elevator) for the tests to be done today You will be contacted by phone if any changes need to be made immediately.  Otherwise, you will receive a letter about your results with an explanation, but please check with MyChart first.  Please remember to sign up for My Chart if you have not done so, as this will be important to you in the future with finding out test results, communicating by private email, and scheduling acute appointments online when needed.  Please return in 3 months, or sooner if needed

## 2013-02-11 DIAGNOSIS — Z0279 Encounter for issue of other medical certificate: Secondary | ICD-10-CM

## 2013-02-11 LAB — RPR

## 2013-02-18 ENCOUNTER — Other Ambulatory Visit (HOSPITAL_COMMUNITY): Payer: Medicare HMO

## 2013-02-25 ENCOUNTER — Other Ambulatory Visit: Payer: Medicare HMO

## 2013-03-01 ENCOUNTER — Other Ambulatory Visit: Payer: Medicare HMO

## 2013-03-09 ENCOUNTER — Other Ambulatory Visit: Payer: Self-pay | Admitting: Internal Medicine

## 2013-03-11 ENCOUNTER — Inpatient Hospital Stay: Admission: RE | Admit: 2013-03-11 | Payer: Medicare HMO | Source: Ambulatory Visit

## 2013-03-29 ENCOUNTER — Other Ambulatory Visit: Payer: Self-pay | Admitting: Internal Medicine

## 2013-05-06 ENCOUNTER — Ambulatory Visit: Payer: Medicare HMO | Admitting: Internal Medicine

## 2013-05-06 DIAGNOSIS — Z0289 Encounter for other administrative examinations: Secondary | ICD-10-CM

## 2013-05-13 ENCOUNTER — Ambulatory Visit: Payer: Medicare HMO | Admitting: Internal Medicine

## 2013-05-15 ENCOUNTER — Observation Stay (HOSPITAL_COMMUNITY)
Admission: EM | Admit: 2013-05-15 | Discharge: 2013-05-18 | Disposition: A | Discharge status: Skilled Nursing Facility | Payer: PRIVATE HEALTH INSURANCE | Attending: Internal Medicine | Admitting: Internal Medicine

## 2013-05-15 ENCOUNTER — Emergency Department (HOSPITAL_COMMUNITY): Payer: PRIVATE HEALTH INSURANCE

## 2013-05-15 ENCOUNTER — Encounter (HOSPITAL_COMMUNITY): Payer: Self-pay | Admitting: Emergency Medicine

## 2013-05-15 DIAGNOSIS — M109 Gout, unspecified: Secondary | ICD-10-CM

## 2013-05-15 DIAGNOSIS — R4182 Altered mental status, unspecified: Secondary | ICD-10-CM | POA: Insufficient documentation

## 2013-05-15 DIAGNOSIS — D509 Iron deficiency anemia, unspecified: Secondary | ICD-10-CM | POA: Insufficient documentation

## 2013-05-15 DIAGNOSIS — F411 Generalized anxiety disorder: Secondary | ICD-10-CM | POA: Insufficient documentation

## 2013-05-15 DIAGNOSIS — Z86010 Personal history of colonic polyps: Secondary | ICD-10-CM

## 2013-05-15 DIAGNOSIS — M1710 Unilateral primary osteoarthritis, unspecified knee: Secondary | ICD-10-CM

## 2013-05-15 DIAGNOSIS — F329 Major depressive disorder, single episode, unspecified: Secondary | ICD-10-CM

## 2013-05-15 DIAGNOSIS — R410 Disorientation, unspecified: Secondary | ICD-10-CM

## 2013-05-15 DIAGNOSIS — G609 Hereditary and idiopathic neuropathy, unspecified: Principal | ICD-10-CM | POA: Insufficient documentation

## 2013-05-15 DIAGNOSIS — G629 Polyneuropathy, unspecified: Secondary | ICD-10-CM

## 2013-05-15 DIAGNOSIS — N179 Acute kidney failure, unspecified: Secondary | ICD-10-CM | POA: Diagnosis present

## 2013-05-15 DIAGNOSIS — M549 Dorsalgia, unspecified: Secondary | ICD-10-CM | POA: Diagnosis present

## 2013-05-15 DIAGNOSIS — I350 Nonrheumatic aortic (valve) stenosis: Secondary | ICD-10-CM

## 2013-05-15 DIAGNOSIS — K219 Gastro-esophageal reflux disease without esophagitis: Secondary | ICD-10-CM | POA: Insufficient documentation

## 2013-05-15 DIAGNOSIS — M6281 Muscle weakness (generalized): Secondary | ICD-10-CM | POA: Insufficient documentation

## 2013-05-15 DIAGNOSIS — E785 Hyperlipidemia, unspecified: Secondary | ICD-10-CM

## 2013-05-15 DIAGNOSIS — E559 Vitamin D deficiency, unspecified: Secondary | ICD-10-CM

## 2013-05-15 DIAGNOSIS — Z0000 Encounter for general adult medical examination without abnormal findings: Secondary | ICD-10-CM

## 2013-05-15 DIAGNOSIS — Z905 Acquired absence of kidney: Secondary | ICD-10-CM | POA: Insufficient documentation

## 2013-05-15 DIAGNOSIS — F0390 Unspecified dementia without behavioral disturbance: Secondary | ICD-10-CM

## 2013-05-15 DIAGNOSIS — M79609 Pain in unspecified limb: Secondary | ICD-10-CM | POA: Insufficient documentation

## 2013-05-15 DIAGNOSIS — R5383 Other fatigue: Secondary | ICD-10-CM

## 2013-05-15 DIAGNOSIS — IMO0001 Reserved for inherently not codable concepts without codable children: Secondary | ICD-10-CM | POA: Diagnosis present

## 2013-05-15 DIAGNOSIS — IMO0002 Reserved for concepts with insufficient information to code with codable children: Secondary | ICD-10-CM | POA: Insufficient documentation

## 2013-05-15 DIAGNOSIS — N259 Disorder resulting from impaired renal tubular function, unspecified: Secondary | ICD-10-CM

## 2013-05-15 DIAGNOSIS — M899 Disorder of bone, unspecified: Secondary | ICD-10-CM

## 2013-05-15 DIAGNOSIS — R29898 Other symptoms and signs involving the musculoskeletal system: Secondary | ICD-10-CM | POA: Diagnosis present

## 2013-05-15 DIAGNOSIS — E119 Type 2 diabetes mellitus without complications: Secondary | ICD-10-CM | POA: Insufficient documentation

## 2013-05-15 DIAGNOSIS — E669 Obesity, unspecified: Secondary | ICD-10-CM | POA: Insufficient documentation

## 2013-05-15 DIAGNOSIS — G4733 Obstructive sleep apnea (adult) (pediatric): Secondary | ICD-10-CM | POA: Insufficient documentation

## 2013-05-15 DIAGNOSIS — D72829 Elevated white blood cell count, unspecified: Secondary | ICD-10-CM | POA: Diagnosis present

## 2013-05-15 DIAGNOSIS — R262 Difficulty in walking, not elsewhere classified: Secondary | ICD-10-CM | POA: Insufficient documentation

## 2013-05-15 DIAGNOSIS — R42 Dizziness and giddiness: Secondary | ICD-10-CM

## 2013-05-15 DIAGNOSIS — I10 Essential (primary) hypertension: Secondary | ICD-10-CM | POA: Insufficient documentation

## 2013-05-15 LAB — COMPREHENSIVE METABOLIC PANEL
ALT: 24 U/L (ref 0–35)
AST: 21 U/L (ref 0–37)
Alkaline Phosphatase: 105 U/L (ref 39–117)
BUN: 18 mg/dL (ref 6–23)
CO2: 26 mEq/L (ref 19–32)
Calcium: 9.3 mg/dL (ref 8.4–10.5)
Creatinine, Ser: 1.3 mg/dL — ABNORMAL HIGH (ref 0.50–1.10)
GFR calc Af Amer: 45 mL/min — ABNORMAL LOW (ref >90)
GFR calc non Af Amer: 39 mL/min — ABNORMAL LOW (ref >90)
Glucose, Bld: 110 mg/dL — ABNORMAL HIGH (ref 70–99)
Potassium: 3.3 mEq/L — ABNORMAL LOW (ref 3.5–5.1)
Sodium: 142 mEq/L (ref 135–145)
Total Protein: 7 g/dL (ref 6.0–8.3)

## 2013-05-15 LAB — CBC WITH DIFFERENTIAL/PLATELET
Basophils Absolute: 0 10*3/uL (ref 0.0–0.1)
Basophils Relative: 0 % (ref 0–1)
Eosinophils Absolute: 0 10*3/uL (ref 0.0–0.7)
Eosinophils Relative: 0 % (ref 0–5)
HCT: 35.3 % — ABNORMAL LOW (ref 36.0–46.0)
Lymphocytes Relative: 7 % — ABNORMAL LOW (ref 12–46)
Lymphs Abs: 1.1 10*3/uL (ref 0.7–4.0)
MCHC: 32.3 g/dL (ref 30.0–36.0)
Monocytes Relative: 8 % (ref 3–12)
Platelets: 280 10*3/uL (ref 150–400)
WBC: 16.5 10*3/uL — ABNORMAL HIGH (ref 4.0–10.5)

## 2013-05-15 LAB — URINALYSIS, ROUTINE W REFLEX MICROSCOPIC
Glucose, UA: NEGATIVE mg/dL
Ketones, ur: 15 mg/dL — AB
Leukocytes, UA: NEGATIVE
Nitrite: NEGATIVE
Protein, ur: 100 mg/dL — AB
Urobilinogen, UA: 1 mg/dL (ref 0.0–1.0)

## 2013-05-15 NOTE — ED Notes (Signed)
Per EMS pt came from home c/o bilateral leg pain x3 days. Yesterday and today pt was unable to bear weight on legs. Pt has hx of neuropathy and feels this is the same pain. VSS. NAD noted. Upon arrival pt incontinent of urine and stool due to not being able to get up to bathroom.

## 2013-05-15 NOTE — ED Notes (Signed)
Pt lightly incontinent of urine.  Skin cleansed and dried, pt on pad.  Redness noted to coccyx area.

## 2013-05-15 NOTE — ED Notes (Signed)
The daughter the pt normally stays with is out of town so pt stayed with daughter who is here in ED with her.  Pt family does not want to take pt home because she cannot get up or walk, she has urinated and defecated all over their couch.  She reported it took 5 people to get her up off the couch to bring her to ED.  Daughter does not feel she can manage this pt at home any longer.  MD aware.  They will discuss alternative options.

## 2013-05-15 NOTE — ED Notes (Signed)
Awaiting hospitalist for possible pt admission.

## 2013-05-15 NOTE — ED Notes (Signed)
Pt returned from radiology.

## 2013-05-15 NOTE — ED Provider Notes (Signed)
CSN: [id number]     Arrival date & time 05/15/13  1446 History   First MD Initiated Contact with Patient 05/15/13 1500     Chief Complaint  Patient presents with  . Peripheral Neuropathy  . Leg Pain   (Consider location/radiation/quality/duration/timing/severity/associated sxs/prior Treatment) HPI Katie Dawson is a 79 y.o. female who presents to the emergency department for concern of loss of ability to function.  Patient's daughter called EMS as they were having some trouble caring for her.  They report that she has history of dementia but is usually ambulatory and able to do most of her ADLs like cooking and using the bathroom unassisted.  However, over the last week, patient has been increasingly dependent over the last 5 days.  Then today, defecated in bed and family was having difficult time caring for patient.  It is new for patient to be this dependent.  Patient reports that she thinks she is doing OK, but reports that the reason that she can't walk is because her peripheral neuropathy is so bad.  Symptoms moderate in severity.  No alleviating or aggravating factors.  Slow decline.  Pain in legs described as burning and similar to previous but worse.  No other symptoms.  Past Medical History  Diagnosis Date  . DIABETES MELLITUS, TYPE II 04/22/2007  . HYPERLIPIDEMIA 04/22/2007  . GOUT 04/22/2007  . ANEMIA-IRON DEFICIENCY 04/22/2007  . ANXIETY 04/22/2007  . OBSTRUCTIVE SLEEP APNEA 04/22/2007  . HYPERTENSION 04/22/2007  . GERD 04/22/2007  . RENAL INSUFFICIENCY 04/22/2007  . 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 FOOT PAIN, LEFT 12/23/2007  . OSTEOPENIA 02/12/2009  . INTERMITTENT VERTIGO 04/20/2009  . HYPERSOMNIA 01/11/2009  . COLONIC POLYPS, HX OF 04/22/2007  . NEPHRECTOMY, HX OF 04/22/2007  . Urinary tract infection     hx of  . Right fibular fracture July 2013   Past Surgical History  Procedure Laterality Date  . S/p right nephrectomy    . S/p parathyroid surgury     . Orif wrist fracture  01/02/2012    Procedure: OPEN REDUCTION INTERNAL FIXATION (ORIF) WRIST FRACTURE;  Surgeon: Marlowe Shores, MD;  Location: [facility] OR;  Service: Orthopedics;  Laterality: Right;  Open Reduction Internal Fixation Right Distal Radius    Family History  Problem Relation Age of Onset  . Heart disease Mother    History  Substance Use Topics  . Smoking status: Never Smoker   . Smokeless tobacco: Never Used  . Alcohol Use: No   OB History   Grav Para Term Preterm Abortions TAB SAB Ect Mult Living                 Review of Systems  Constitutional: Negative for fever and chills.  HENT: Negative for congestion and rhinorrhea.   Respiratory: Negative for cough and shortness of breath.   Cardiovascular: Negative for chest pain.  Gastrointestinal: Negative for nausea, vomiting, abdominal pain, diarrhea and abdominal distention.  Endocrine: Negative for polyuria.  Genitourinary: Negative for dysuria.  Musculoskeletal: Negative for neck pain and neck stiffness.  Skin: Negative for rash.  Neurological: Negative for headaches.  Psychiatric/Behavioral: Negative.     Allergies  Indomethacin  Home Medications   Current Outpatient Rx  Name  Route  Sig  Dispense  Refill  . amLODipine (NORVASC) 10 MG tablet      TAKE 1 TABLET EACH DAY.   30 tablet   11   . benazepril (LOTENSIN) 40 MG tablet      TAKE 1  TABLET TWICE DAILY.   60 tablet   11   . busPIRone (BUSPAR) 7.5 MG tablet      TAKE 1 TABLET THREE TIMES DAILY AS NEEDED FOR NERVES.   90 tablet   3   . EXPIRED: citalopram (CELEXA) 10 MG tablet   Oral   Take 1 tablet (10 mg total) by mouth daily.   90 tablet   3   . cloNIDine (CATAPRES) 0.1 MG tablet      TAKE 1 TABLET TWICE DAILY.   60 tablet   11   . donepezil (ARICEPT) 5 MG tablet   Oral   Take 1 tablet (5 mg total) by mouth at bedtime as needed.   90 tablet   3   . Ferrous Sulfate (IRON) 325 (65 FE) MG TABS   Oral   Take by mouth daily.            Marland Kitchen GLIPIZIDE XL 10 MG 24 hr tablet      TAKE 2 TABLETS DAILY.   60 tablet   11   . hydrochlorothiazide (MICROZIDE) 12.5 MG capsule      TAKE (1) CAPSULE DAILY.   90 capsule   0   . HYDROcodone-acetaminophen (NORCO/VICODIN) 5-325 MG per tablet   Oral   Take 1 tablet by mouth every 6 (six) hours as needed for pain.   60 tablet   1   . insulin glargine (LANTUS) 100 UNIT/ML injection   Subcutaneous   Inject 0.6 mLs (60 Units total) into the skin at bedtime.   10 mL   11   . Insulin Pen Needle (BD PEN NEEDLE NANO U/F) 32G X 4 MM MISC   Does not apply   1 application by Does not apply route daily.   100 each   11   . isosorbide mononitrate (IMDUR) 60 MG 24 hr tablet      TAKE ONE TABLET AT BEDTIME.   30 tablet   11   . labetalol (NORMODYNE) 200 MG tablet      TAKE 1 TABLET TWICE DAILY.   60 tablet   11   . Lancets MISC   Does not apply   1 application by Does not apply route daily.   100 each   11   . EXPIRED: metFORMIN (GLUCOPHAGE) 500 MG tablet   Oral   Take 1 tablet (500 mg total) by mouth 2 (two) times daily with a meal.   180 tablet   3   . pravastatin (PRAVACHOL) 40 MG tablet      TAKE 1 TABLET EACH DAY.   30 tablet   11    BP 117/51  Pulse 66  Temp(Src) 98.1 F (36.7 C) (Oral)  Resp 17  Wt 230 lb (104.327 kg)  SpO2 94% Physical Exam  Nursing note and vitals reviewed. Constitutional: She is oriented to person, place, and time. She appears well-developed and well-nourished. No distress.  HENT:  Head: Normocephalic and atraumatic.  Right Ear: External ear normal.  Left Ear: External ear normal.  Nose: Nose normal.  Mouth/Throat: Oropharynx is clear and moist. No oropharyngeal exudate.  Eyes: EOM are normal. Pupils are equal, round, and reactive to light.  Neck: Normal range of motion. Neck supple. No tracheal deviation present.  Cardiovascular: Normal rate.   Pulmonary/Chest: Effort normal and breath sounds normal. No stridor.  No respiratory distress. She has no wheezes. She has no rales.  Abdominal: Soft. She exhibits no distension. There is no tenderness. There is  no rebound.  Musculoskeletal: Normal range of motion.  Neurological: She is alert and oriented to person, place, and time.  Skin: Skin is warm and dry. She is not diaphoretic.    ED Course  Procedures (including critical care time) Labs Review Labs Reviewed  CBC WITH DIFFERENTIAL - Abnormal; Notable for the following:    WBC 16.5 (*)    Hemoglobin 11.4 (*)    HCT 35.3 (*)    Neutrophils Relative % 86 (*)    Neutro Abs 14.1 (*)    Lymphocytes Relative 7 (*)    Monocytes Absolute 1.2 (*)    All other components within normal limits  COMPREHENSIVE METABOLIC PANEL - Abnormal; Notable for the following:    Potassium 3.3 (*)    Glucose, Bld 110 (*)    Creatinine, Ser 1.30 (*)    Albumin 2.9 (*)    GFR calc non Af Amer 39 (*)    GFR calc Af Amer 45 (*)    All other components within normal limits  GLUCOSE, CAPILLARY - Abnormal; Notable for the following:    Glucose-Capillary 105 (*)    All other components within normal limits  URINALYSIS, ROUTINE W REFLEX MICROSCOPIC   Imaging Review Dg Chest 1 View  05/15/2013   CLINICAL DATA:  Peripheral neuropathy, leg pain  EXAM: CHEST - 1 VIEW  COMPARISON:  01/02/2012  FINDINGS: Chronic interstitial markings. No frank interstitial edema. No pleural effusion or pneumothorax.  Cardiomegaly.  IMPRESSION: Cardiomegaly with chronic interstitial markings. No frank interstitial edema.   Electronically Signed   By: Charline Bills M.D.   On: 05/15/2013 16:39   Ct Head Wo Contrast  05/15/2013   CLINICAL DATA:  Peripheral neuropathy.  Leg pain.  EXAM: CT HEAD WITHOUT CONTRAST  TECHNIQUE: Contiguous axial images were obtained from the base of the skull through the vertex without intravenous contrast.  COMPARISON:  CT head 12/24/2011  FINDINGS: Generalized atrophy, unchanged. Chronic microvascular ischemic change  in the white matter is unchanged from the prior study.  Negative for acute infarct negative for hemorrhage or mass. The calvarium is intact.  IMPRESSION: Atrophy and chronic microvascular ischemic change. No acute abnormality   Electronically Signed   By: Marlan Palau M.D.   On: 05/15/2013 16:35    EKG Interpretation   None       MDM   1. Altered mental status   2. Peripheral neuropathy   3. ARF (acute renal failure)   4. Leucocytosis   5. Lower extremity weakness   6. Type II or unspecified type diabetes mellitus without mention of complication, not stated as uncontrolled    Katie Dawson is a 79 y.o. female with history of DM, HLD, dementia, OSA, and renal insufficiency who presents to the emergency department with AMS and inability to function.  No focal abnormalities on exam.  Patient unable to walk.  Found to have ARF and leukocytosis on exam.  Will admit patient for further management.  No indication for abx at this point in time.  Patient safe for floor admission.  Patient admitted.    Arloa Koh, MD 05/16/13 [phone]

## 2013-05-15 NOTE — ED Notes (Signed)
Report to receiving RN Inetta Fermo.  Pt to floor with EMT accompanying

## 2013-05-15 NOTE — ED Notes (Signed)
Pt ate most of meal.  Family given cokes.

## 2013-05-15 NOTE — ED Notes (Signed)
Family at bedside. 

## 2013-05-15 NOTE — ED Notes (Addendum)
Introduced self to pt and family.  Pt eating dinner tray.  Reports she is experiencing a lot of pain in both legs "neuropathy" with L>R.  Unable to get up due to pain.  NIBP on q 30 min.  Cardiac rhythm SR with occ PVC.

## 2013-05-16 ENCOUNTER — Inpatient Hospital Stay (HOSPITAL_COMMUNITY): Payer: PRIVATE HEALTH INSURANCE

## 2013-05-16 DIAGNOSIS — D72829 Elevated white blood cell count, unspecified: Secondary | ICD-10-CM

## 2013-05-16 DIAGNOSIS — E119 Type 2 diabetes mellitus without complications: Secondary | ICD-10-CM

## 2013-05-16 DIAGNOSIS — R29898 Other symptoms and signs involving the musculoskeletal system: Secondary | ICD-10-CM

## 2013-05-16 DIAGNOSIS — F0390 Unspecified dementia without behavioral disturbance: Secondary | ICD-10-CM

## 2013-05-16 DIAGNOSIS — N179 Acute kidney failure, unspecified: Secondary | ICD-10-CM

## 2013-05-16 LAB — GLUCOSE, CAPILLARY
Glucose-Capillary: 128 mg/dL — ABNORMAL HIGH (ref 70–99)
Glucose-Capillary: 275 mg/dL — ABNORMAL HIGH (ref 70–99)
Glucose-Capillary: 181 mg/dL — ABNORMAL HIGH (ref 70–99)

## 2013-05-16 LAB — COMPREHENSIVE METABOLIC PANEL
ALT: 25 U/L (ref 0–35)
AST: 19 U/L (ref 0–37)
Albumin: 2.7 g/dL — ABNORMAL LOW (ref 3.5–5.2)
Calcium: 9.1 mg/dL (ref 8.4–10.5)
Sodium: 140 mEq/L (ref 135–145)
Total Protein: 6.9 g/dL (ref 6.0–8.3)

## 2013-05-16 LAB — CBC WITH DIFFERENTIAL/PLATELET
Basophils Absolute: 0 10*3/uL (ref 0.0–0.1)
Eosinophils Relative: 0 % (ref 0–5)
HCT: 34.3 % — ABNORMAL LOW (ref 36.0–46.0)
Hemoglobin: 11.3 g/dL — ABNORMAL LOW (ref 12.0–15.0)
Lymphocytes Relative: 14 % (ref 12–46)
Lymphs Abs: 2.3 10*3/uL (ref 0.7–4.0)
MCH: 30.1 pg (ref 26.0–34.0)
MCV: 91.2 fL (ref 78.0–100.0)
Monocytes Absolute: 1.8 10*3/uL — ABNORMAL HIGH (ref 0.1–1.0)
Platelets: 264 10*3/uL (ref 150–400)
RBC: 3.76 MIL/uL — ABNORMAL LOW (ref 3.87–5.11)
WBC: 16.5 10*3/uL — ABNORMAL HIGH (ref 4.0–10.5)

## 2013-05-16 LAB — CK: Total CK: 108 U/L (ref 7–177)

## 2013-05-16 MED ORDER — INSULIN ASPART 100 UNIT/ML ~~LOC~~ SOLN
0.0000 [IU] | Freq: Three times a day (TID) | SUBCUTANEOUS | Status: DC
  Administered 2013-05-16: 5 [IU] via SUBCUTANEOUS
  Administered 2013-05-16: 3 [IU] via SUBCUTANEOUS
  Administered 2013-05-17: 2 [IU] via SUBCUTANEOUS
  Administered 2013-05-17: 3 [IU] via SUBCUTANEOUS
  Administered 2013-05-18: 1 [IU] via SUBCUTANEOUS

## 2013-05-16 MED ORDER — HYDRALAZINE HCL 20 MG/ML IJ SOLN: 10.0000 mg | INTRAMUSCULAR | Status: DC | PRN

## 2013-05-16 MED ORDER — INSULIN GLARGINE 100 UNIT/ML ~~LOC~~ SOLN
60.0000 [IU] | Freq: Every day | SUBCUTANEOUS | Status: DC
  Administered 2013-05-16 – 2013-05-17 (×2): 60 [IU] via SUBCUTANEOUS
  Filled 2013-05-16 (×3): qty 0.6

## 2013-05-16 MED ORDER — ONDANSETRON HCL 4 MG/2ML IJ SOLN: 4.0000 mg | Freq: Four times a day (QID) | INTRAMUSCULAR | Status: DC | PRN

## 2013-05-16 MED ORDER — ONDANSETRON HCL 4 MG PO TABS: 4.0000 mg | ORAL_TABLET | Freq: Four times a day (QID) | ORAL | Status: DC | PRN

## 2013-05-16 MED ORDER — ENOXAPARIN SODIUM 30 MG/0.3ML ~~LOC~~ SOLN
30.0000 mg | SUBCUTANEOUS | Status: DC
  Administered 2013-05-17: 30 mg via SUBCUTANEOUS
  Filled 2013-05-16: qty 0.3

## 2013-05-16 MED ORDER — CLONIDINE HCL 0.1 MG PO TABS
0.1000 mg | ORAL_TABLET | Freq: Two times a day (BID) | ORAL | Status: DC
  Administered 2013-05-16 – 2013-05-18 (×5): 0.1 mg via ORAL
  Filled 2013-05-16 (×7): qty 1

## 2013-05-16 MED ORDER — BUSPIRONE HCL 15 MG PO TABS
7.5000 mg | ORAL_TABLET | Freq: Three times a day (TID) | ORAL | Status: DC | PRN
  Filled 2013-05-16: qty 1

## 2013-05-16 MED ORDER — LABETALOL HCL 200 MG PO TABS
200.0000 mg | ORAL_TABLET | Freq: Two times a day (BID) | ORAL | Status: DC
  Administered 2013-05-16 – 2013-05-18 (×5): 200 mg via ORAL
  Filled 2013-05-16 (×6): qty 1

## 2013-05-16 MED ORDER — GLIPIZIDE ER 10 MG PO TB24
20.0000 mg | ORAL_TABLET | Freq: Every day | ORAL | Status: DC
  Administered 2013-05-16: 20 mg via ORAL
  Filled 2013-05-16 (×2): qty 2

## 2013-05-16 MED ORDER — ACETAMINOPHEN 650 MG RE SUPP: 650.0000 mg | Freq: Four times a day (QID) | RECTAL | Status: DC | PRN

## 2013-05-16 MED ORDER — DONEPEZIL HCL 5 MG PO TABS
5.0000 mg | ORAL_TABLET | Freq: Every day | ORAL | Status: DC
  Administered 2013-05-16 – 2013-05-17 (×3): 5 mg via ORAL
  Filled 2013-05-16 (×4): qty 1

## 2013-05-16 MED ORDER — ISOSORBIDE MONONITRATE ER 60 MG PO TB24
60.0000 mg | ORAL_TABLET | Freq: Every day | ORAL | Status: DC
  Administered 2013-05-16 – 2013-05-17 (×3): 60 mg via ORAL
  Filled 2013-05-16 (×4): qty 1

## 2013-05-16 MED ORDER — CITALOPRAM HYDROBROMIDE 10 MG PO TABS
10.0000 mg | ORAL_TABLET | Freq: Every day | ORAL | Status: DC
  Administered 2013-05-16 – 2013-05-18 (×3): 10 mg via ORAL
  Filled 2013-05-16 (×3): qty 1

## 2013-05-16 MED ORDER — AMLODIPINE BESYLATE 10 MG PO TABS
10.0000 mg | ORAL_TABLET | Freq: Every day | ORAL | Status: DC
  Administered 2013-05-16 – 2013-05-18 (×3): 10 mg via ORAL
  Filled 2013-05-16 (×3): qty 1

## 2013-05-16 MED ORDER — SODIUM CHLORIDE 0.9 % IV SOLN
INTRAVENOUS | Status: AC
  Administered 2013-05-16: 06:00:00 via INTRAVENOUS

## 2013-05-16 MED ORDER — POTASSIUM CHLORIDE IN NACL 20-0.9 MEQ/L-% IV SOLN
INTRAVENOUS | Status: DC
  Administered 2013-05-16: 05:00:00 via INTRAVENOUS
  Filled 2013-05-16: qty 1000

## 2013-05-16 MED ORDER — POTASSIUM CHLORIDE CRYS ER 20 MEQ PO TBCR
20.0000 meq | EXTENDED_RELEASE_TABLET | Freq: Once | ORAL | Status: AC
  Administered 2013-05-16: 20 meq via ORAL
  Filled 2013-05-16: qty 1

## 2013-05-16 MED ORDER — SIMVASTATIN 20 MG PO TABS
20.0000 mg | ORAL_TABLET | Freq: Every day | ORAL | Status: DC
  Administered 2013-05-16 – 2013-05-18 (×3): 20 mg via ORAL
  Filled 2013-05-16 (×3): qty 1

## 2013-05-16 MED ORDER — BENAZEPRIL HCL 40 MG PO TABS
40.0000 mg | ORAL_TABLET | Freq: Every day | ORAL | Status: DC
  Filled 2013-05-16: qty 1

## 2013-05-16 MED ORDER — ENOXAPARIN SODIUM 40 MG/0.4ML ~~LOC~~ SOLN
40.0000 mg | SUBCUTANEOUS | Status: DC
  Administered 2013-05-16: 40 mg via SUBCUTANEOUS
  Filled 2013-05-16: qty 0.4

## 2013-05-16 MED ORDER — ACETAMINOPHEN 325 MG PO TABS: 650.0000 mg | ORAL_TABLET | Freq: Four times a day (QID) | ORAL | Status: DC | PRN

## 2013-05-16 MED ORDER — POTASSIUM CHLORIDE CRYS ER 20 MEQ PO TBCR
40.0000 meq | EXTENDED_RELEASE_TABLET | Freq: Two times a day (BID) | ORAL | Status: DC
  Administered 2013-05-16 – 2013-05-18 (×5): 40 meq via ORAL
  Filled 2013-05-16 (×7): qty 2

## 2013-05-16 MED ORDER — HYDROCODONE-ACETAMINOPHEN 5-325 MG PO TABS
1.0000 | ORAL_TABLET | Freq: Four times a day (QID) | ORAL | Status: DC | PRN
  Administered 2013-05-16 – 2013-05-18 (×5): 1 via ORAL
  Filled 2013-05-16 (×5): qty 1

## 2013-05-16 NOTE — Progress Notes (Addendum)
[facility] PROGRESS NOTE  Katie Dawson:[id number] DOB: February 11, 1934 DOA: 05/15/2013 PCP: Oliver Barre, MD  Assessment/Plan:   Lower extremity weakness and pain - patient has got good deep tendon reflexes so GBS unlikely.  MRI L-spine shows Spondylolisthesis at L4-5 with bilateral lateral recess stenosis which could affect either or both L5 nerves.  Central disc bulge which slightly compresses the thecal sac at  L3-4.  x-rays of her knee and foot showed  arthritis. PT/OT consult.   Acute renal failure - cause not clear. Have held patient's benazepril HCTZ and metformin due to renal failure. I have ordered urine sodium and creatinine to check for FeNa. Gently hydrate and closely following take output and metabolic panel. I have ordered renal sonogram to check for any obstruction. Leukocytosis - patient is afebrile. Check blood cultures and sedimentation rate and closely follow CBC.  Diabetes mellitus type 2 - holding metformin due to renal failure.  CBG (last 3)   Recent Labs  05/15/13 1600 05/16/13 1149  GLUCAP 105* 275*    Closely follow CBGs as patient's creatinine is elevated.  Hypertension - holding benazepril and diuretics due to renal failure. Patient has been placed on clear IV hydralazine for systolic blood pressure 160.  History of dementia - on Aricept.  Hypokalemia: replete as needed.   Code Status: Full code.  Family Communication: None.  Disposition Plan: Admit to inpatient.     Consultants:  none  Procedures:  US KIDNEY  Antibiotics:  NONE  HPI/Subjective: No sob, cp , .   Objective: Filed Vitals:   05/16/13 0900  BP: 176/72  Pulse: 80  Temp: 98.2 F (36.8 C)  Resp: 18    Intake/Output Summary (Last 24 hours) at 05/16/13 1237 Last data filed at 05/16/13 0600  Gross per 24 hour  Intake     90 ml  Output      0 ml  Net     90 ml   Filed Weights   05/15/13 1455 05/15/13 2254  Weight: 104.327 kg (230 lb) 119.205 kg (262 lb  12.8 oz)    Exam:   General:  ALERT AFEBRILE comfortable  Cardiovascular: s1s2  Respiratory: ctab  Abdomen: soft NT ND BS+  Musculoskeletal: no pedal edema.   Data Reviewed: Basic Metabolic Panel:  Recent Labs Lab 05/15/13 1552 05/16/13 0059  NA 142 140  K 3.3* 3.2*  CL 102 101  CO2 26 26  GLUCOSE 110* 152*  BUN 18 31*  CREATININE 1.30* 1.86*  CALCIUM 9.3 9.1   Liver Function Tests:  Recent Labs Lab 05/15/13 1552 05/16/13 0059  AST 21 19  ALT 24 25  ALKPHOS 105 101  BILITOT 1.0 0.6  PROT 7.0 6.9  ALBUMIN 2.9* 2.7*   No results found for this basename: LIPASE, AMYLASE,  in the last 168 hours No results found for this basename: AMMONIA,  in the last 168 hours CBC:  Recent Labs Lab 05/15/13 1552 05/16/13 0059  WBC 16.5* 16.5*  NEUTROABS 14.1* 12.4*  HGB 11.4* 11.3*  HCT 35.3* 34.3*  MCV 91.0 91.2  PLT 280 264   Cardiac Enzymes:  Recent Labs Lab 05/16/13 1057  CKTOTAL 108   BNP (last 3 results) No results found for this basename: PROBNP,  in the last 8760 hours CBG:  Recent Labs Lab 05/15/13 1600 05/16/13 1149  GLUCAP 105* 275*    No results found for this or any previous visit (from the past 240 hour(s)).   Studies: Dg Chest 1 View  05/15/2013   CLINICAL DATA:  Peripheral neuropathy, leg pain  EXAM: CHEST - 1 VIEW  COMPARISON:  01/02/2012  FINDINGS: Chronic interstitial markings. No frank interstitial edema. No pleural effusion or pneumothorax.  Cardiomegaly.  IMPRESSION: Cardiomegaly with chronic interstitial markings. No frank interstitial edema.   Electronically Signed   By: Charline Bills M.D.   On: 05/15/2013 16:39   Ct Head Wo Contrast  05/15/2013   CLINICAL DATA:  Peripheral neuropathy.  Leg pain.  EXAM: CT HEAD WITHOUT CONTRAST  TECHNIQUE: Contiguous axial images were obtained from the base of the skull through the vertex without intravenous contrast.  COMPARISON:  CT head 12/24/2011  FINDINGS: Generalized atrophy,  unchanged. Chronic microvascular ischemic change in the white matter is unchanged from the prior study.  Negative for acute infarct negative for hemorrhage or mass. The calvarium is intact.  IMPRESSION: Atrophy and chronic microvascular ischemic change. No acute abnormality   Electronically Signed   By: Marlan Palau M.D.   On: 05/15/2013 16:35   Mr Lumbar Spine Wo Contrast  05/16/2013   CLINICAL DATA:  Lower extremity pain and weakness.  EXAM: MRI LUMBAR SPINE WITHOUT CONTRAST  TECHNIQUE: Multiplanar, multisequence MR imaging was performed. No intravenous contrast was administered.  COMPARISON:  None.  FINDINGS: Conus tip at T12-L1.  Evidence of previous right nephrectomy.  T10-11 through T12-L1:  Normal.  L1-2: Tiny disc bulge just to the left of midline with no neural impingement.  L2-3: Small disc bulges into the neural foramina with no neural impingement.  L3-4: Central disc bulge which slightly compresses the thecal sac. Asymmetric bulge into the left neural foramen without neural impingement. Narrowing of the left lateral recess. Anterior slip of L4 with respect to L3.  L4-5: 3.5 mm spondylolisthesis due to bilateral severe facet arthritis. Small broad-based bulge of the uncovered disc. Hypertrophied ligamentum flavum and facet joints compress both lateral recesses and could affect either or both L5 nerves. Slight spinal stenosis.  L5-S1: Tiny central disc bulge and annular tear with no neural impingement. Moderate right and slight left facet arthritis.  IMPRESSION: 1. Spondylolisthesis at L4-5 with bilateral lateral recess stenosis which could affect either or both L5 nerves. 2. Central disc bulge which slightly compresses the thecal sac at L3-4.   Electronically Signed   By: Geanie Cooley M.D.   On: 05/16/2013 09:19   US Renal  05/16/2013   CLINICAL DATA:  Acute renal failure.  EXAM: RENAL/URINARY TRACT ULTRASOUND COMPLETE  COMPARISON:  None.  FINDINGS: Right Kidney:  Prior nephrectomy.  Left  Kidney:  Length: 12.6 cm. Echogenicity within normal limits. No hydronephrosis. There are 2 anechoic left upper pole renal masses measuring 1.1 x 0.8 x 1.3 cm and 1.8 x 1.7 x 2.1 cm respectively.  Bladder:  Appears normal for degree of bladder distention.  IMPRESSION: Prior right nephrectomy.  No obstructive left uropathy.   Electronically Signed   By: Elige Ko   On: 05/16/2013 10:36   Dg Knee Complete 4 Views Left  05/16/2013   CLINICAL DATA:  Bilateral foot and knee pain  EXAM: LEFT KNEE - COMPLETE 4+ VIEW  COMPARISON:  None.  FINDINGS: There is no fracture or dislocation. There is no significant joint effusion. There are tricompartmental degenerative changes most severe in the medial femorotibial compartment with joint space narrowing and marginal osteophytosis. There is generalized osteopenia.  IMPRESSION: No acute osseous injury of the left knee.  Tricompartmental osteoarthritis most severe in the medial femorotibial compartment.   Electronically Signed  By: Elige Ko   On: 05/16/2013 08:16   Dg Knee Complete 4 Views Right  05/16/2013   CLINICAL DATA:  Right knee pain.  EXAM: RIGHT KNEE - COMPLETE 4+ VIEW  COMPARISON:  10/06/2012  FINDINGS: There is a Pellegrini-Stieda lesion consistent with prior injury of the medial collateral ligament. The appearance is unchanged.  There is an old bone infarct in the lateral femoral condyle with tricompartmental osteoarthritis. The findings are unchanged. Decreased small joint effusion since the prior study. Old healed fracture of the proximal fibula.  IMPRESSION: Decreased joint effusions since the study of 10/06/2012. No other change. Tricompartmental osteoarthritis.   Electronically Signed   By: Geanie Cooley M.D.   On: 05/16/2013 08:17   Dg Foot Complete Left  05/16/2013   CLINICAL DATA:  Bilateral foot pain.  EXAM: LEFT FOOT - COMPLETE 3+ VIEW  COMPARISON:  None.  FINDINGS: The left foot demonstrates no fracture or dislocation. There is generalized  osteopenia. There is a plantar calcaneal spur. There are enthesopathic changes of the Achilles tendon insertion. There is no soft tissue abnormality. There is no subcutaneous emphysema or radiopaque foreign bodies.  IMPRESSION: No acute osseous injury of the left foot.   Electronically Signed   By: Elige Ko   On: 05/16/2013 08:19   Dg Foot Complete Right  05/16/2013   CLINICAL DATA:  Bilateral knee pain and foot pain.  EXAM: RIGHT FOOT COMPLETE - 3+ VIEW  COMPARISON:  None.  FINDINGS: The right foot demonstrates no fracture or dislocation. There is generalized osteopenia. There is a plantar calcaneal spur. There are enthesopathic changes at the Achilles tendon insertion. There are mild degenerative changes at the 1st through 4th TMT joints. There is no soft tissue abnormality. There is no subcutaneous emphysema or radiopaque foreign bodies.  IMPRESSION: No acute osseous injury of the right foot.   Electronically Signed   By: Elige Ko   On: 05/16/2013 08:17    Scheduled Meds: . amLODipine  10 mg Oral Daily  . citalopram  10 mg Oral Daily  . cloNIDine  0.1 mg Oral BID  . donepezil  5 mg Oral QHS  . enoxaparin (LOVENOX) injection  40 mg Subcutaneous Q24H  . glipiZIDE  20 mg Oral Q breakfast  . insulin aspart  0-9 Units Subcutaneous TID WC  . insulin glargine  60 Units Subcutaneous QHS  . isosorbide mononitrate  60 mg Oral QHS  . labetalol  200 mg Oral BID  . potassium chloride  40 mEq Oral BID  . simvastatin  20 mg Oral q1800   Continuous Infusions: . sodium chloride 75 mL/hr at 05/16/13 9562    Principal Problem:   Lower extremity weakness Active Problems:   DIABETES MELLITUS, TYPE II   ARF (acute renal failure)   Leucocytosis   Peripheral neuropathy    Time spent: 84 MIN    Genelle Economou  [facility] Pager [phone] If 7PM-7AM, please contact night-coverage at [email or web address], password [facility] 05/16/2013, 12:37 PM  LOS: 1 day

## 2013-05-16 NOTE — ED Provider Notes (Signed)
I saw and evaluated the patient, reviewed the resident's note and I agree with the findings and plan.  EKG Interpretation    Date/Time:  Sunday May 15 2013 17:36:40 EST Ventricular Rate:  69 PR Interval:  183 QRS Duration: 102 QT Interval:  408 QTC Calculation: 437 R Axis:     Text Interpretation:  Sinus rhythm Abnormal R-wave progression, early transition No significant change since last tracing Confirmed by Gwendolyn Grant  MD, Jaryan Chicoine ([id number]) on 05/15/2013 5:45:10 PM            Elderly female here with weakness. Patient couldn't move for past 4 days, found in her own excrement. Alert and oriented here. Weakness stemming from peripheral neuropathy. Neurovascularly intact. Patient's labs normal. Family can't take her home because she has nowhere to sleep or stay. Medicine admitting.   Dagmar Hait, MD 05/16/13 [id number]

## 2013-05-16 NOTE — Clinical Social Work Psychosocial (Signed)
Clinical Social Work Department BRIEF PSYCHOSOCIAL ASSESSMENT 05/16/2013  Patient:  TAQUITA, DEMBY     Account Number:  [id number]     Admit date:  05/15/2013  Clinical Social Worker:  Delmer Islam  Date/Time:  05/16/2013 05:59 AM  Referred by:  Physician  Date Referred:  05/16/2013 Referred for  SNF Placement   Other Referral:   Interview type:  Patient Other interview type:   Also in the room with patient was her friend Editor, commissioning. When asked, patient indicated that she wanted friend to remain in room while talking with CSW.    PSYCHOSOCIAL DATA Living Status:  FAMILY Admitted from facility:   Level of care:   Primary support name:  Amatha Hardison Primary support relationship to patient:  CHILD, ADULT Degree of support available:   Patient's two daughter, Lucio Edward and Erma Pinto are very supportive. Patient lives with one of her daughters.    CURRENT CONCERNS Current Concerns  Post-Acute Placement   Other Concerns:    SOCIAL WORK ASSESSMENT / PLAN CSW talked with patient about discharge planning and recommendation of ST rehab at a skilled facility. Patient is in agreement and while talking to patient, friend Randa Evens got one of the daughters on the phone and she listened to some of the conversation and is in agreement. Their facility preference is [facility].   Assessment/plan status:  Psychosocial Support/Ongoing Assessment of Needs Other assessment/ plan:   Information/referral to community resources:   Skilled facility list for [facility]    PATIENT'S/FAMILY'S RESPONSE TO PLAN OF CARE: Ms. Alderfer was alert, oriented and open to talking with CSW. She is agreeable to short-term rehab and was assured that she would be in the facility to get stronger and meet the PT/OT goals, then would be going home and patient expressed understanding.

## 2013-05-16 NOTE — H&P (Signed)
[facility] History and Physical  Katie Dawson:[id number] DOB: [birth year] DOA: 05/15/2013  Referring physician: ER physician. PCP: Oliver Barre, MD   Chief Complaint: Difficulty walking with lower extremity pain.  HPI: Katie Dawson is a 79 y.o. female history of diabetes mellitus type 2, hypertension, hyperlipidemia was brought to the ER by patient's family as patient was found to be increasingly difficult to ambulate. Patient states that he has chronic pain of her lower extremities in her knees and foot but over last one week it has further worsened to the point that she has pain and numbness in the lower extremities which worsens on placing pressure. In the ER in addition patient was found to have leukocytosis but afebrile. Patient's creatinine has increased from baseline. Patient otherwise denies any nausea vomiting abdominal pain diarrhea chest pain shortness of breath productive cough. Patient did defecate on her bed yesterday but states that she has not lost control of her urine or bowels. Patient has been made for further management.  Review of Systems: As presented in the history of presenting illness, rest negative.  Past Medical History  Diagnosis Date  . DIABETES MELLITUS, TYPE II 04/22/2007  . HYPERLIPIDEMIA 04/22/2007  . GOUT 04/22/2007  . ANEMIA-IRON DEFICIENCY 04/22/2007  . ANXIETY 04/22/2007  . OBSTRUCTIVE SLEEP APNEA 04/22/2007  . HYPERTENSION 04/22/2007  . GERD 04/22/2007  . RENAL INSUFFICIENCY 04/22/2007  . 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 FOOT PAIN, LEFT 12/23/2007  . OSTEOPENIA 02/12/2009  . INTERMITTENT VERTIGO 04/20/2009  . HYPERSOMNIA 01/11/2009  . COLONIC POLYPS, HX OF 04/22/2007  . NEPHRECTOMY, HX OF 04/22/2007  . Urinary tract infection     hx of  . Right fibular fracture July 2013   Past Surgical History  Procedure Laterality Date  . S/p right nephrectomy    . S/p parathyroid surgury    . Orif wrist fracture  01/02/2012   Procedure: OPEN REDUCTION INTERNAL FIXATION (ORIF) WRIST FRACTURE;  Surgeon: Marlowe Shores, MD;  Location: [facility] OR;  Service: Orthopedics;  Laterality: Right;  Open Reduction Internal Fixation Right Distal Radius    Social History:  reports that she has never smoked. She has never used smokeless tobacco. She reports that she does not drink alcohol or use illicit drugs. Where does patient live  home. Can patient participate in ADLs?  No.  No Active Allergies  Family History:  Family History  Problem Relation Age of Onset  . Heart disease Mother       Prior to Admission medications   Medication Sig Start Date End Date Taking? Authorizing Provider  amLODipine (NORVASC) 10 MG tablet Take 10 mg by mouth daily.   Yes Historical Provider, MD  benazepril (LOTENSIN) 40 MG tablet Take 40 mg by mouth daily.   Yes Historical Provider, MD  busPIRone (BUSPAR) 7.5 MG tablet Take 7.5 mg by mouth 3 (three) times daily as needed (nerves).   Yes Historical Provider, MD  cloNIDine (CATAPRES) 0.1 MG tablet Take 0.1 mg by mouth 2 (two) times daily.   Yes Historical Provider, MD  donepezil (ARICEPT) 5 MG tablet Take 1 tablet (5 mg total) by mouth at bedtime as needed. 02/10/13  Yes Corwin Levins, MD  Ferrous Sulfate (IRON) 325 (65 FE) MG TABS Take by mouth daily.     Yes Historical Provider, MD  glipiZIDE (GLUCOTROL XL) 10 MG 24 hr tablet Take 20 mg by mouth daily with breakfast.   Yes Historical Provider, MD  hydrochlorothiazide (MICROZIDE) 12.5 MG capsule TAKE (  1) CAPSULE DAILY. 01/30/13  Yes Corwin Levins, MD  HYDROcodone-acetaminophen (NORCO/VICODIN) 5-325 MG per tablet Take 1 tablet by mouth every 6 (six) hours as needed for pain. 11/15/12  Yes Corwin Levins, MD  insulin glargine (LANTUS) 100 UNIT/ML injection Inject 0.6 mLs (60 Units total) into the skin at bedtime. 02/10/13 02/10/14 Yes Corwin Levins, MD  isosorbide mononitrate (IMDUR) 60 MG 24 hr tablet Take 60 mg by mouth at bedtime.   Yes Historical Provider,  MD  labetalol (NORMODYNE) 200 MG tablet Take 200 mg by mouth 2 (two) times daily.   Yes Historical Provider, MD  metFORMIN (GLUCOPHAGE) 500 MG tablet Take 1 tablet (500 mg total) by mouth 2 (two) times daily with a meal. 02/23/12 05/15/13 Yes Corwin Levins, MD  pravastatin (PRAVACHOL) 40 MG tablet Take 40 mg by mouth daily.   Yes Historical Provider, MD  citalopram (CELEXA) 10 MG tablet Take 1 tablet (10 mg total) by mouth daily. 04/08/12 04/08/13  Corwin Levins, MD    Physical Exam: Filed Vitals:   05/15/13 2024 05/15/13 2136 05/15/13 2200 05/15/13 2254  BP:  127/86 123/72 126/64  Pulse: 76  69 71  Temp:    97.9 F (36.6 C)  TempSrc:    Oral  Resp: 14     Weight:    119.205 kg (262 lb 12.8 oz)  SpO2: 97%  96% 99%     General:  Well-developed well-nourished.  Eyes:  Anicteric no pallor.  ENT:  No discharge from ears eyes nose mouth.  Neck:  No mass felt.  Cardiovascular:  S1-S2 heard.  Respiratory:  No rhonchi or crepitations.  Abdomen:  Soft nontender bowel sounds present no guarding or rigidity.  Skin:  No rash.  Musculoskeletal:  Pain on moving her knees and foot.  Psychiatric:  Appears normal.  Neurologic:  Alert and oriented to time place and person. Moves all extremities. Has good deep tendon reflexes.  Labs on Admission:  Basic Metabolic Panel:  Recent Labs Lab 05/15/13 1552  NA 142  K 3.3*  CL 102  CO2 26  GLUCOSE 110*  BUN 18  CREATININE 1.30*  CALCIUM 9.3   Liver Function Tests:  Recent Labs Lab 05/15/13 1552  AST 21  ALT 24  ALKPHOS 105  BILITOT 1.0  PROT 7.0  ALBUMIN 2.9*   No results found for this basename: LIPASE, AMYLASE,  in the last 168 hours No results found for this basename: AMMONIA,  in the last 168 hours CBC:  Recent Labs Lab 05/15/13 1552  WBC 16.5*  NEUTROABS 14.1*  HGB 11.4*  HCT 35.3*  MCV 91.0  PLT 280   Cardiac Enzymes: No results found for this basename: CKTOTAL, CKMB, CKMBINDEX, TROPONINI,  in the last 168  hours  BNP (last 3 results) No results found for this basename: PROBNP,  in the last 8760 hours CBG:  Recent Labs Lab 05/15/13 1600  GLUCAP 105*    Radiological Exams on Admission: Dg Chest 1 View  05/15/2013   CLINICAL DATA:  Peripheral neuropathy, leg pain  EXAM: CHEST - 1 VIEW  COMPARISON:  01/02/2012  FINDINGS: Chronic interstitial markings. No frank interstitial edema. No pleural effusion or pneumothorax.  Cardiomegaly.  IMPRESSION: Cardiomegaly with chronic interstitial markings. No frank interstitial edema.   Electronically Signed   By: Charline Bills M.D.   On: 05/15/2013 16:39   Ct Head Wo Contrast  05/15/2013   CLINICAL DATA:  Peripheral neuropathy.  Leg pain.  EXAM: CT  HEAD WITHOUT CONTRAST  TECHNIQUE: Contiguous axial images were obtained from the base of the skull through the vertex without intravenous contrast.  COMPARISON:  CT head 12/24/2011  FINDINGS: Generalized atrophy, unchanged. Chronic microvascular ischemic change in the white matter is unchanged from the prior study.  Negative for acute infarct negative for hemorrhage or mass. The calvarium is intact.  IMPRESSION: Atrophy and chronic microvascular ischemic change. No acute abnormality   Electronically Signed   By: Marlan Palau M.D.   On: 05/15/2013 16:35     Assessment/Plan Principal Problem:   Lower extremity weakness Active Problems:   DIABETES MELLITUS, TYPE II   ARF (acute renal failure)   Leucocytosis   Peripheral neuropathy   1. Lower extremity weakness and pain - patient has got good deep tendon reflexes so GBS unlikely. I have ordered MRI L-spine and x-rays of her knee and foot to check for any worsening of the arthritis. Check uric acid levels. Get physical therapy consult. 2. Acute renal failure - cause not clear. Have held patient's benazepril HCTZ and metformin due to renal failure. I have ordered urine sodium and creatinine to check for FeNa. Gently hydrate and closely following take output  and metabolic panel. I have ordered renal sonogram to check for any obstruction. 3. Leukocytosis - patient is afebrile. Check blood cultures and sedimentation rate and closely follow CBC. 4. Diabetes mellitus type 2 - holding metformin due to renal failure. Closely follow CBGs as patient's creatinine is elevated. 5. Hypertension - holding benazepril and diuretics due to renal failure. Patient has been placed on clear IV hydralazine for systolic blood pressure 160. 6. History of dementia - on Aricept. 7. Chronic anemia - follow CBC.  I have reviewed patient's old chart and labs and compared with present admission labs.  Code Status:  Full code.  Family Communication:  None.  Disposition Plan:  Admit to inpatient.    Derrian Rodak N. [facility] Pager [phone].  If 7PM-7AM, please contact night-coverage [email or web address] Password [email or web address] 05/16/2013, 12:06 AM

## 2013-05-16 NOTE — Evaluation (Signed)
Physical Therapy Evaluation Patient Details Name: Katie Dawson MRN: [id number] DOB: August 21, 1933 Today's Date: 05/16/2013 Time: 4098-1191 PT Time Calculation (min): 17 min  PT Assessment / Plan / Recommendation History of Present Illness  Pt is a 79 y.o. female adm due to generalized LE weakness and inability to walk. Pt was brought in by her daughters after she had a BM and was incontient on their couch. Pt with medical history of DM II, hyperlipidemia, and HTN.   Clinical Impression  Pt adm due to the above. Presents with decreased independence with functional mobility secondary to LE pain, weakness, decreased activity tolerance and decreased sensation. Pt to benefit from skilled PT to address deficits listed below (see PT problem list) and decrease caregiver burden. Pt to benefit from post acute rehab prior to D/C home. CSW notified.   PT Assessment  Patient needs continued PT services    Follow Up Recommendations  SNF;Supervision/Assistance - 24 hour    Does the patient have the potential to tolerate intense rehabilitation      Barriers to Discharge Decreased caregiver support;Inaccessible home environment      Equipment Recommendations  Other (comment) (TBD at SNF)    Recommendations for Other Services OT consult   Frequency Min 3X/week    Precautions / Restrictions Precautions Precautions: Fall Precaution Comments: pt reports she has fallen "a few times"  Restrictions Weight Bearing Restrictions: No   Pertinent Vitals/Pain 10/10 in bil LEs       Mobility  Bed Mobility Bed Mobility: Sitting - Scoot to Edge of Bed;Supine to Sit;Sit to Supine Supine to Sit: 4: Min assist;HOB elevated;With rails Sitting - Scoot to Edge of Bed: 4: Min assist;With rail Sit to Supine: 4: Min assist;HOB elevated;With rail Details for Bed Mobility Assistance: pt with increased difficulty advancing LE to/off EOB secondary to increased pain in LEs and weakness. (A) to advance LEs off EOB and  onto EOB; requires incr time due to pain; cues for hand placement and sequencing  Transfers Transfers: Not assessed (pt refused due to pain ) Ambulation/Gait Stairs: No Wheelchair Mobility Wheelchair Mobility: No         PT Diagnosis: Difficulty walking;Generalized weakness;Acute pain  PT Problem List: Decreased strength;Decreased activity tolerance;Decreased mobility;Decreased balance;Decreased safety awareness;Decreased knowledge of use of DME;Pain;Obesity;Impaired sensation PT Treatment Interventions: DME instruction;Gait training;Functional mobility training;Therapeutic activities;Therapeutic exercise;Balance training;Neuromuscular re-education;Patient/family education     PT Goals(Current goals can be found in the care plan section) Acute Rehab PT Goals Patient Stated Goal: to not have pain in my legs PT Goal Formulation: With patient Time For Goal Achievement: 05/30/13 Potential to Achieve Goals: Good  Visit Information  Last PT Received On: 05/16/13 Assistance Needed: +2 (for OOB activities ) History of Present Illness: Pt is a 79 y.o. female adm due to generalized LE weakness and inability to walk. Pt was brought in by her daughters after she had a BM and was incontient on their couch. Pt with medical history of DM II, hyperlipidemia, and HTN.        Prior Functioning  Home Living Family/patient expects to be discharged to:: Unsure Living Arrangements: Children Additional Comments: Pt reports that she believes she can live with daughters upon acute D/C; per pt there would be 3 STE house and pt is unsure of handrails.  Prior Function Level of Independence: Independent with assistive device(s) Comments: Pt reports she ambulates with SPC; has had daughter living with her since her fall last year but did not require 24/7 (A) Communication Communication:  No difficulties    Cognition  Cognition Arousal/Alertness: Awake/alert Behavior During Therapy: WFL for tasks  assessed/performed Overall Cognitive Status: Within Functional Limits for tasks assessed    Extremity/Trunk Assessment Upper Extremity Assessment Upper Extremity Assessment: Defer to OT evaluation Lower Extremity Assessment Lower Extremity Assessment: Generalized weakness (difficult to assess secondary to pain ) Cervical / Trunk Assessment Cervical / Trunk Assessment: Kyphotic   Balance Balance Balance Assessed: Yes Static Sitting Balance Static Sitting - Balance Support: Bilateral upper extremity supported;Feet unsupported Static Sitting - Level of Assistance: 5: Stand by assistance Static Sitting - Comment/# of Minutes: pt reported she was unable to place feet on the ground secondary to pain; pt tolerated sitting EOB ~5 min    End of Session PT - End of Session Activity Tolerance: Patient limited by pain;Patient limited by fatigue Patient left: in bed;with call bell/phone within reach;with nursing/sitter in room;Other (comment) (transport waiting to take pt to [place]) Nurse Communication: Mobility status;Precautions  GP     Donell Sievert, [place] [phone] 05/16/2013, 11:43 AM

## 2013-05-16 NOTE — Clinical Social Work Placement (Addendum)
Clinical Social Work Department CLINICAL SOCIAL WORK PLACEMENT NOTE 05/16/2013  Patient:  Katie Dawson, Katie Dawson  Account Number:  [id number] Admit date:  05/15/2013  Clinical Social Worker:  Genelle Bal, LCSW  Date/time:  05/16/2013 06:07 AM  Clinical Social Work is seeking post-discharge placement for this patient at the following level of care:   SKILLED NURSING   (*CSW will update this form in Epic as items are completed)   05/16/2013  Patient/family provided with [facility] Department of Clinical Social Work's list of facilities offering this level of care within the geographic area requested by the patient (or if unable, by the patient's family).  05/16/2013  Patient/family informed of their freedom to choose among providers that offer the needed level of care, that participate in Medicare, Medicaid or managed care program needed by the patient, have an available bed and are willing to accept the patient.    Patient/family informed of [facility]' ownership interest in [facility], as well as of the fact that they are under no obligation to receive care at this facility.  PASARR submitted to EDS on  PASARR number received from EDS on   FL2 transmitted to all facilities in geographic area requested by pt/family on  05/16/2013 FL2 transmitted to all facilities within larger geographic area on   Patient informed that his/her managed care company has contracts with or will negotiate with  certain facilities, including the following:     Patient/family informed of bed offers received: 05/17/13   Patient [facility] bed at [facility]  Physician recommends and patient chooses bed at    Patient to be transferred to [facility] on 05/18/13   Patient to be transferred to facility by ambulance  The following physician request were entered in Epic:  Additional Comments: 05/18/13: Bedside nurse contacted patient's daughter to advise of patient's d/c and transport  via ambulance to facility.

## 2013-05-17 DIAGNOSIS — G609 Hereditary and idiopathic neuropathy, unspecified: Secondary | ICD-10-CM

## 2013-05-17 DIAGNOSIS — M79609 Pain in unspecified limb: Secondary | ICD-10-CM

## 2013-05-17 LAB — GLUCOSE, CAPILLARY: Glucose-Capillary: 197 mg/dL — ABNORMAL HIGH (ref 70–99)

## 2013-05-17 LAB — CBC WITH DIFFERENTIAL/PLATELET
Basophils Absolute: 0 10*3/uL (ref 0.0–0.1)
Eosinophils Absolute: 0.3 10*3/uL (ref 0.0–0.7)
Eosinophils Relative: 2 % (ref 0–5)
HCT: 32.7 % — ABNORMAL LOW (ref 36.0–46.0)
Lymphocytes Relative: 12 % (ref 12–46)
MCH: 29.8 pg (ref 26.0–34.0)
MCHC: 33 g/dL (ref 30.0–36.0)
MCV: 90.3 fL (ref 78.0–100.0)
Monocytes Absolute: 1.2 10*3/uL — ABNORMAL HIGH (ref 0.1–1.0)
Neutro Abs: 10.3 10*3/uL — ABNORMAL HIGH (ref 1.7–7.7)
RDW: 15 % (ref 11.5–15.5)
WBC: 13.4 10*3/uL — ABNORMAL HIGH (ref 4.0–10.5)

## 2013-05-17 LAB — BASIC METABOLIC PANEL
BUN: 46 mg/dL — ABNORMAL HIGH (ref 6–23)
CO2: 23 mEq/L (ref 19–32)
Calcium: 8.8 mg/dL (ref 8.4–10.5)
Chloride: 107 mEq/L (ref 96–112)
Glucose, Bld: 216 mg/dL — ABNORMAL HIGH (ref 70–99)
Potassium: 4.7 mEq/L (ref 3.5–5.1)
Sodium: 139 mEq/L (ref 135–145)

## 2013-05-17 MED ORDER — OXYCODONE HCL 5 MG PO TABS: 5.0000 mg | ORAL_TABLET | ORAL | Status: DC | PRN

## 2013-05-17 MED ORDER — ENOXAPARIN SODIUM 40 MG/0.4ML ~~LOC~~ SOLN
40.0000 mg | SUBCUTANEOUS | Status: DC
  Administered 2013-05-18: 40 mg via SUBCUTANEOUS
  Filled 2013-05-17: qty 0.4

## 2013-05-17 MED ORDER — GABAPENTIN 100 MG PO CAPS
100.0000 mg | ORAL_CAPSULE | Freq: Three times a day (TID) | ORAL | Status: DC
  Administered 2013-05-17 – 2013-05-18 (×4): 100 mg via ORAL
  Filled 2013-05-17 (×5): qty 1

## 2013-05-17 NOTE — Consult Note (Signed)
CC:  Chief Complaint  Patient presents with  . Peripheral Neuropathy  . Leg Pain    HPI: Katie Dawson is a 79 y.o. female admitted for worsening pain. The patient does not provide a clear history, however states that her symptoms have been going on for the last 2 or 3 weeks. She describes pain primarily in her feet, as well some pain in her knees and her legs being a below the knee. When asked specifically, she says her worst pain is in both her feet. She is unable to further characterize the quality of the pain, stating that it simply "hurts". She says the pain is worse when her feet are touched, especially when she is standing. She denies any numbness or tingling. She also denies any changes in bladder function. She also denies any back pain.  PMH: Past Medical History  Diagnosis Date  . DIABETES MELLITUS, TYPE II 04/22/2007  . HYPERLIPIDEMIA 04/22/2007  . GOUT 04/22/2007  . ANEMIA-IRON DEFICIENCY 04/22/2007  . ANXIETY 04/22/2007  . OBSTRUCTIVE SLEEP APNEA 04/22/2007  . HYPERTENSION 04/22/2007  . GERD 04/22/2007  . RENAL INSUFFICIENCY 04/22/2007  . 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 FOOT PAIN, LEFT 12/23/2007  . OSTEOPENIA 02/12/2009  . INTERMITTENT VERTIGO 04/20/2009  . HYPERSOMNIA 01/11/2009  . COLONIC POLYPS, HX OF 04/22/2007  . NEPHRECTOMY, HX OF 04/22/2007  . Urinary tract infection     hx of  . Right fibular fracture July 2013    PSH: Past Surgical History  Procedure Laterality Date  . S/p right nephrectomy    . S/p parathyroid surgury    . Orif wrist fracture  01/02/2012    Procedure: OPEN REDUCTION INTERNAL FIXATION (ORIF) WRIST FRACTURE;  Surgeon: Marlowe Shores, MD;  Location: [facility] OR;  Service: Orthopedics;  Laterality: Right;  Open Reduction Internal Fixation Right Distal Radius     SH: History  Substance Use Topics  . Smoking status: Never Smoker   . Smokeless tobacco: Never Used  . Alcohol Use: No    MEDS: Prior to Admission medications    Medication Sig Start Date End Date Taking? Authorizing Provider  amLODipine (NORVASC) 10 MG tablet Take 10 mg by mouth daily.   Yes Historical Provider, MD  benazepril (LOTENSIN) 40 MG tablet Take 40 mg by mouth daily.   Yes Historical Provider, MD  busPIRone (BUSPAR) 7.5 MG tablet Take 7.5 mg by mouth 3 (three) times daily as needed (nerves).   Yes Historical Provider, MD  cloNIDine (CATAPRES) 0.1 MG tablet Take 0.1 mg by mouth 2 (two) times daily.   Yes Historical Provider, MD  donepezil (ARICEPT) 5 MG tablet Take 1 tablet (5 mg total) by mouth at bedtime as needed. 02/10/13  Yes Corwin Levins, MD  Ferrous Sulfate (IRON) 325 (65 FE) MG TABS Take by mouth daily.     Yes Historical Provider, MD  glipiZIDE (GLUCOTROL XL) 10 MG 24 hr tablet Take 20 mg by mouth daily with breakfast.   Yes Historical Provider, MD  hydrochlorothiazide (MICROZIDE) 12.5 MG capsule TAKE (1) CAPSULE DAILY. 01/30/13  Yes Corwin Levins, MD  HYDROcodone-acetaminophen (NORCO/VICODIN) 5-325 MG per tablet Take 1 tablet by mouth every 6 (six) hours as needed for pain. 11/15/12  Yes Corwin Levins, MD  insulin glargine (LANTUS) 100 UNIT/ML injection Inject 0.6 mLs (60 Units total) into the skin at bedtime. 02/10/13 02/10/14 Yes Corwin Levins, MD  isosorbide mononitrate (IMDUR) 60 MG 24 hr tablet Take 60 mg by mouth at bedtime.  Yes Historical Provider, MD  labetalol (NORMODYNE) 200 MG tablet Take 200 mg by mouth 2 (two) times daily.   Yes Historical Provider, MD  metFORMIN (GLUCOPHAGE) 500 MG tablet Take 1 tablet (500 mg total) by mouth 2 (two) times daily with a meal. 02/23/12 05/15/13 Yes Corwin Levins, MD  pravastatin (PRAVACHOL) 40 MG tablet Take 40 mg by mouth daily.   Yes Historical Provider, MD  citalopram (CELEXA) 10 MG tablet Take 1 tablet (10 mg total) by mouth daily. 04/08/12 04/08/13  Corwin Levins, MD    ALLERGY: No Active Allergies  NEUROLOGIC EXAM: Awake, alert, oriented Speech fluent, appropriate CN grossly intact Motor  exam: Upper Extremities Deltoid Bicep Tricep Grip  Right 5/5 5/5 5/5 5/5  Left 5/5 5/5 5/5 5/5   Lower Extremity IP Quad PF DF EHL  Right 4+/5 4/5 >3/5 >3/5 >3/5  Left 4+/5 4+/5 >3/5 >3/5 >3/5    IMGAING: MRI L-spine reviewed demonstrating grade I spondylolisthesis L4-5 with bilateral facet arthropathy and moderate-severe spinal stenosis with bilateral lateral recess stenosis. There is a small disc at L3-4 with effacement of the ventral sac without significant compression.  IMPRESSION: - 79 y.o. female with lumbar spondylosis on MRI, but symptoms not consistent with claudication or radiculopathy. More likely exacerbation of peripheral neuropathy from underlying diabetes.  PLAN: - No acute neurosurgical intervention is required - Pt can f/u in my clinic as an outpatient on a PRN basis

## 2013-05-17 NOTE — Evaluation (Signed)
Occupational Therapy Evaluation Patient Details Name: Katie Dawson MRN: [id number] DOB: 01-10-1934 Today's Date: 05/17/2013 Time: 4098-1191 OT Time Calculation (min): 13 min  OT Assessment / Plan / Recommendation History of present illness Pt is a 79 y.o. female adm due to generalized LE weakness and inability to walk. Pt was brought in by her daughters after she had a BM and was incontient on their couch. Pt with medical history of DM II, hyperlipidemia, and HTN, gout, perpherial neuropathy   Clinical Impression   This 79 yo female admitted with above presents to acute OT with decreased AROM Bil LEs, increased pain Bil UEs with movement (R>L), obesity all affecting pt's PLOF as of two weeks ago per pt of being able to live alone and taking care of herself. Will benefit from acute OT with follow up at SNF.    OT Assessment  Patient needs continued OT Services    Follow Up Recommendations  SNF    Barriers to Discharge Decreased caregiver support    Equipment Recommendations   (TBD at next venue)       Frequency  Min 2X/week    Precautions / Restrictions Precautions Precautions: Fall Precaution Comments: pt reports she has fallen "a few times"  Restrictions Weight Bearing Restrictions: No   Pertinent Vitals/Pain 10/10 Bil LEs with movement    ADL  Eating/Feeding: Set up Where Assessed - Eating/Feeding: Bed level Grooming: Set up Where Assessed - Grooming: Supine, head of bed up Upper Body Bathing: Minimal assistance Where Assessed - Upper Body Bathing: Supine, head of bed up Lower Body Bathing: +1 Total assistance Where Assessed - Lower Body Bathing: Supine, head of bed up;Supine, head of bed flat;Rolling right and/or left Upper Body Dressing: Maximal assistance Where Assessed - Upper Body Dressing: Supine, head of bed up Lower Body Dressing: +1 Total assistance Where Assessed - Lower Body Dressing: Supine, head of bed up;Supine, head of bed flat;Rolling right and/or  left Transfers/Ambulation Related to ADLs: Unable at this time due to pain in BIl LEs    OT Diagnosis: Generalized weakness;Acute pain  OT Problem List: Decreased strength;Decreased activity tolerance;Decreased range of motion;Pain;Obesity OT Treatment Interventions: Self-care/ADL training;DME and/or AE instruction;Therapeutic activities;Balance training;Patient/family education   OT Goals(Current goals can be found in the care plan section) Acute Rehab OT Goals Patient Stated Goal: get this pain in my legs better OT Goal Formulation: With patient Time For Goal Achievement: 05/31/13 Potential to Achieve Goals: Good  Visit Information  Last OT Received On: 05/17/13 Assistance Needed: +2 History of Present Illness: Pt is a 79 y.o. female adm due to generalized LE weakness and inability to walk. Pt was brought in by her daughters after she had a BM and was incontient on their couch. Pt with medical history of DM II, hyperlipidemia, and HTN, gout, perpherial neuropathy       Prior Functioning     Home Living Family/patient expects to be discharged to:: Private residence (up until 2 weeks ago she was living by herself per pt, then started alternating with children) Living Arrangements: Children Available Help at Discharge: Family;Available 24 hours/day Prior Function Level of Independence: Independent with assistive device(s) Comments: Pt reports she ambulates with SPC; has had daughter living with her since her fall last year but did not require 24/7 (A) Communication Communication: No difficulties Dominant Hand: Right         Vision/Perception Vision - History Patient Visual Report: No change from baseline   Cognition  Cognition Arousal/Alertness: Awake/alert Behavior During  Therapy: WFL for tasks assessed/performed Overall Cognitive Status: Within Functional Limits for tasks assessed    Extremity/Trunk Assessment Upper Extremity Assessment Upper Extremity Assessment:  Generalized weakness     Mobility Bed Mobility Bed Mobility: Rolling Right;Rolling Left Rolling Right: 4: Min assist;With rail Rolling Left: 4: Min assist;With rail           End of Session OT - End of Session Activity Tolerance: Patient limited by pain Patient left: in bed;with bed alarm set;with call bell/phone within reach       Evette Georges [phone] 05/17/2013, 1:32 PM

## 2013-05-17 NOTE — Progress Notes (Signed)
[facility] PROGRESS NOTE  MERRISA SKORUPSKI WUJ:[id number] DOB: August 03, 1933 DOA: 05/15/2013 PCP: Oliver Barre, MD Brief HPI: Katie Dawson is a 79 y.o. female history of diabetes mellitus type 2, hypertension, hyperlipidemia was brought to the ER by patient's family as patient was found to be increasingly difficult to ambulate. Patient states that he has chronic pain of her lower extremities in her knees and foot but over last one week it has further worsened to the point that she has pain and numbness in the lower extremities which worsens on placing pressure. MRI lumbar spine showed compression of the thecal sac at l3 and L4. Neuro surgery consulted today , discussed with the daughter and the patient.    Assessment/Plan:   Lower extremity weakness and pain, tingling and numbness - patient has got good deep tendon reflexes so GBS unlikely. She has a component of diabetic neuropathy. She was started on neurontin today.   MRI L-spine shows Spondylolisthesis at L4-5 with bilateral lateral recess stenosis which could affect either or both L5 nerves.  Central disc bulge which slightly compresses the thecal sac at L3-4. Neuro surgery consulted. Pain control.   x-rays of her knee and foot showed  arthritis. PT/OT consult recommended SNF placement.     Acute renal failure - cause not clear. Have held patient's benazepril HCTZ and metformin due to renal failure. I have ordered urine sodium and creatinine to check for FeNa. Gently hydrate and closely following take output and metabolic panel. Renal ultrasound negative for hydronephrosis. Repeat renal parameters in am.   Leukocytosis - patient is afebrile. Check blood cultures and sedimentation rate and closely follow CBC.  Diabetes mellitus type 2 - holding metformin due to renal failure.  CBG (last 3)   Recent Labs  05/16/13 2215 05/17/13 0746 05/17/13 1200  GLUCAP 181* 86 186*    Closely follow CBGs as patient's creatinine is elevated. SSI.    Hypertension - holding benazepril and diuretics due to renal failure. Patient has been placed on  IV hydralazine prn.   History of dementia - on Aricept.  Hypokalemia: replete as needed.   Code Status: Full code.  Family Communication: None.  Disposition Plan: Admit to inpatient.     Consultants:  NEUROSURGERY.  Procedures:  US KIDNEY  Antibiotics:  NONE  HPI/Subjective: No sob, cp , . Reports lower extremity pain.   Objective: Filed Vitals:   05/17/13 0900  BP: 142/63  Pulse: 76  Temp: 98.4 F (36.9 C)  Resp: 16    Intake/Output Summary (Last 24 hours) at 05/17/13 1524 Last data filed at 05/17/13 0745  Gross per 24 hour  Intake    720 ml  Output      0 ml  Net    720 ml   Filed Weights   05/15/13 1455 05/15/13 2254 05/16/13 2209  Weight: 104.327 kg (230 lb) 119.205 kg (262 lb 12.8 oz) 119.614 kg (263 lb 11.2 oz)    Exam:   General:  ALERT AFEBRILE comfortable  Cardiovascular: s1s2  Respiratory: ctab  Abdomen: soft NT ND BS+  Musculoskeletal: no pedal edema.   Data Reviewed: Basic Metabolic Panel:  Recent Labs Lab 05/15/13 1552 05/16/13 0059  NA 142 140  K 3.3* 3.2*  CL 102 101  CO2 26 26  GLUCOSE 110* 152*  BUN 18 31*  CREATININE 1.30* 1.86*  CALCIUM 9.3 9.1   Liver Function Tests:  Recent Labs Lab 05/15/13 1552 05/16/13 0059  AST 21 19  ALT 24 25  ALKPHOS 105 101  BILITOT 1.0 0.6  PROT 7.0 6.9  ALBUMIN 2.9* 2.7*   No results found for this basename: LIPASE, AMYLASE,  in the last 168 hours No results found for this basename: AMMONIA,  in the last 168 hours CBC:  Recent Labs Lab 05/15/13 1552 05/16/13 0059 05/17/13 0952  WBC 16.5* 16.5* 13.4*  NEUTROABS 14.1* 12.4* 10.3*  HGB 11.4* 11.3* 10.8*  HCT 35.3* 34.3* 32.7*  MCV 91.0 91.2 90.3  PLT 280 264 323   Cardiac Enzymes:  Recent Labs Lab 05/16/13 1057  CKTOTAL 108   BNP (last 3 results) No results found for this basename: PROBNP,  in the last 8760  hours CBG:  Recent Labs Lab 05/16/13 1149 05/16/13 1641 05/16/13 2215 05/17/13 0746 05/17/13 1200  GLUCAP 275* 225* 181* 86 186*    Recent Results (from the past 240 hour(s))  CULTURE, BLOOD (ROUTINE X 2)     Status: None   Collection Time    05/16/13 10:45 AM      Result Value Range Status   Specimen Description BLOOD ARM LEFT   Final   Special Requests BOTTLES DRAWN AEROBIC ONLY 2CC   Final   Culture  Setup Time     Final   Value: 05/16/2013 15:52     Performed at [facility]   Culture     Final   Value:        BLOOD CULTURE RECEIVED NO GROWTH TO DATE CULTURE WILL BE HELD FOR 5 DAYS BEFORE ISSUING A FINAL NEGATIVE REPORT     Performed at [facility]   Report Status PENDING   Incomplete  CULTURE, BLOOD (ROUTINE X 2)     Status: None   Collection Time    05/16/13 10:57 AM      Result Value Range Status   Specimen Description BLOOD HAND LEFT   Final   Special Requests BOTTLES DRAWN AEROBIC AND ANAEROBIC 10CC   Final   Culture  Setup Time     Final   Value: 05/16/2013 15:53     Performed at [facility]   Culture     Final   Value:        BLOOD CULTURE RECEIVED NO GROWTH TO DATE CULTURE WILL BE HELD FOR 5 DAYS BEFORE ISSUING A FINAL NEGATIVE REPORT     Performed at [facility]   Report Status PENDING   Incomplete     Studies: Dg Chest 1 View  05/15/2013   CLINICAL DATA:  Peripheral neuropathy, leg pain  EXAM: CHEST - 1 VIEW  COMPARISON:  01/02/2012  FINDINGS: Chronic interstitial markings. No frank interstitial edema. No pleural effusion or pneumothorax.  Cardiomegaly.  IMPRESSION: Cardiomegaly with chronic interstitial markings. No frank interstitial edema.   Electronically Signed   By: Charline Bills M.D.   On: 05/15/2013 16:39   Ct Head Wo Contrast  05/15/2013   CLINICAL DATA:  Peripheral neuropathy.  Leg pain.  EXAM: CT HEAD WITHOUT CONTRAST  TECHNIQUE: Contiguous axial images were obtained from the base of the skull through the  vertex without intravenous contrast.  COMPARISON:  CT head 12/24/2011  FINDINGS: Generalized atrophy, unchanged. Chronic microvascular ischemic change in the white matter is unchanged from the prior study.  Negative for acute infarct negative for hemorrhage or mass. The calvarium is intact.  IMPRESSION: Atrophy and chronic microvascular ischemic change. No acute abnormality   Electronically Signed   By: Marlan Palau M.D.   On: 05/15/2013 16:35  Mr Lumbar Spine Wo Contrast  05/16/2013   CLINICAL DATA:  Lower extremity pain and weakness.  EXAM: MRI LUMBAR SPINE WITHOUT CONTRAST  TECHNIQUE: Multiplanar, multisequence MR imaging was performed. No intravenous contrast was administered.  COMPARISON:  None.  FINDINGS: Conus tip at T12-L1.  Evidence of previous right nephrectomy.  T10-11 through T12-L1:  Normal.  L1-2: Tiny disc bulge just to the left of midline with no neural impingement.  L2-3: Small disc bulges into the neural foramina with no neural impingement.  L3-4: Central disc bulge which slightly compresses the thecal sac. Asymmetric bulge into the left neural foramen without neural impingement. Narrowing of the left lateral recess. Anterior slip of L4 with respect to L3.  L4-5: 3.5 mm spondylolisthesis due to bilateral severe facet arthritis. Small broad-based bulge of the uncovered disc. Hypertrophied ligamentum flavum and facet joints compress both lateral recesses and could affect either or both L5 nerves. Slight spinal stenosis.  L5-S1: Tiny central disc bulge and annular tear with no neural impingement. Moderate right and slight left facet arthritis.  IMPRESSION: 1. Spondylolisthesis at L4-5 with bilateral lateral recess stenosis which could affect either or both L5 nerves. 2. Central disc bulge which slightly compresses the thecal sac at L3-4.   Electronically Signed   By: Geanie Cooley M.D.   On: 05/16/2013 09:19   US Renal  05/16/2013   CLINICAL DATA:  Acute renal failure.  EXAM: RENAL/URINARY  TRACT ULTRASOUND COMPLETE  COMPARISON:  None.  FINDINGS: Right Kidney:  Prior nephrectomy.  Left Kidney:  Length: 12.6 cm. Echogenicity within normal limits. No hydronephrosis. There are 2 anechoic left upper pole renal masses measuring 1.1 x 0.8 x 1.3 cm and 1.8 x 1.7 x 2.1 cm respectively.  Bladder:  Appears normal for degree of bladder distention.  IMPRESSION: Prior right nephrectomy.  No obstructive left uropathy.   Electronically Signed   By: Elige Ko   On: 05/16/2013 10:36   Dg Knee Complete 4 Views Left  05/16/2013   CLINICAL DATA:  Bilateral foot and knee pain  EXAM: LEFT KNEE - COMPLETE 4+ VIEW  COMPARISON:  None.  FINDINGS: There is no fracture or dislocation. There is no significant joint effusion. There are tricompartmental degenerative changes most severe in the medial femorotibial compartment with joint space narrowing and marginal osteophytosis. There is generalized osteopenia.  IMPRESSION: No acute osseous injury of the left knee.  Tricompartmental osteoarthritis most severe in the medial femorotibial compartment.   Electronically Signed   By: Elige Ko   On: 05/16/2013 08:16   Dg Knee Complete 4 Views Right  05/16/2013   CLINICAL DATA:  Right knee pain.  EXAM: RIGHT KNEE - COMPLETE 4+ VIEW  COMPARISON:  10/06/2012  FINDINGS: There is a Pellegrini-Stieda lesion consistent with prior injury of the medial collateral ligament. The appearance is unchanged.  There is an old bone infarct in the lateral femoral condyle with tricompartmental osteoarthritis. The findings are unchanged. Decreased small joint effusion since the prior study. Old healed fracture of the proximal fibula.  IMPRESSION: Decreased joint effusions since the study of 10/06/2012. No other change. Tricompartmental osteoarthritis.   Electronically Signed   By: Geanie Cooley M.D.   On: 05/16/2013 08:17   Dg Foot Complete Left  05/16/2013   CLINICAL DATA:  Bilateral foot pain.  EXAM: LEFT FOOT - COMPLETE 3+ VIEW  COMPARISON:   None.  FINDINGS: The left foot demonstrates no fracture or dislocation. There is generalized osteopenia. There is a plantar calcaneal spur.  There are enthesopathic changes of the Achilles tendon insertion. There is no soft tissue abnormality. There is no subcutaneous emphysema or radiopaque foreign bodies.  IMPRESSION: No acute osseous injury of the left foot.   Electronically Signed   By: Elige Ko   On: 05/16/2013 08:19   Dg Foot Complete Right  05/16/2013   CLINICAL DATA:  Bilateral knee pain and foot pain.  EXAM: RIGHT FOOT COMPLETE - 3+ VIEW  COMPARISON:  None.  FINDINGS: The right foot demonstrates no fracture or dislocation. There is generalized osteopenia. There is a plantar calcaneal spur. There are enthesopathic changes at the Achilles tendon insertion. There are mild degenerative changes at the 1st through 4th TMT joints. There is no soft tissue abnormality. There is no subcutaneous emphysema or radiopaque foreign bodies.  IMPRESSION: No acute osseous injury of the right foot.   Electronically Signed   By: Elige Ko   On: 05/16/2013 08:17    Scheduled Meds: . amLODipine  10 mg Oral Daily  . citalopram  10 mg Oral Daily  . cloNIDine  0.1 mg Oral BID  . donepezil  5 mg Oral QHS  . enoxaparin (LOVENOX) injection  30 mg Subcutaneous Q24H  . gabapentin  100 mg Oral TID  . insulin aspart  0-9 Units Subcutaneous TID WC  . insulin glargine  60 Units Subcutaneous QHS  . isosorbide mononitrate  60 mg Oral QHS  . labetalol  200 mg Oral BID  . potassium chloride  40 mEq Oral BID  . simvastatin  20 mg Oral q1800   Continuous Infusions:    Principal Problem:   Lower extremity weakness Active Problems:   DIABETES MELLITUS, TYPE II   ARF (acute renal failure)   Leucocytosis   Peripheral neuropathy    Time spent: 28 MIN    Murielle Stang  [facility] Pager [phone] If 7PM-7AM, please contact night-coverage at [email or web address], password [facility] 05/17/2013, 3:24 PM  LOS: 2 days

## 2013-05-17 NOTE — Progress Notes (Signed)
PT Cancellation Note  Patient Details Name: Katie Dawson MRN: [id number] DOB: 14-Jul-1933   Cancelled Treatment:    Reason Eval/Treat Not Completed: Patient declined, no reason specified;Other (comment) ("My legs hurt so bad I can't do anything,come back tomorrow")   Ernestene Mention 05/17/2013, 7:53 AM  Clarita Crane, PT, DPT [phone]

## 2013-05-18 DIAGNOSIS — R4182 Altered mental status, unspecified: Secondary | ICD-10-CM

## 2013-05-18 DIAGNOSIS — M549 Dorsalgia, unspecified: Secondary | ICD-10-CM

## 2013-05-18 DIAGNOSIS — F411 Generalized anxiety disorder: Secondary | ICD-10-CM

## 2013-05-18 DIAGNOSIS — I10 Essential (primary) hypertension: Secondary | ICD-10-CM

## 2013-05-18 DIAGNOSIS — F329 Major depressive disorder, single episode, unspecified: Secondary | ICD-10-CM

## 2013-05-18 DIAGNOSIS — M109 Gout, unspecified: Secondary | ICD-10-CM

## 2013-05-18 LAB — BASIC METABOLIC PANEL
BUN: 42 mg/dL — ABNORMAL HIGH (ref 6–23)
Chloride: 112 mEq/L (ref 96–112)
Glucose, Bld: 104 mg/dL — ABNORMAL HIGH (ref 70–99)
Potassium: 5.1 mEq/L (ref 3.5–5.1)

## 2013-05-18 LAB — GLUCOSE, CAPILLARY
Glucose-Capillary: 95 mg/dL (ref 70–99)
Glucose-Capillary: 131 mg/dL — ABNORMAL HIGH (ref 70–99)

## 2013-05-18 MED ORDER — INSULIN ASPART 100 UNIT/ML ~~LOC~~ SOLN: 0.0000 [IU] | Freq: Three times a day (TID) | SUBCUTANEOUS | Status: DC

## 2013-05-18 MED ORDER — GABAPENTIN 100 MG PO CAPS: 100.0000 mg | ORAL_CAPSULE | Freq: Three times a day (TID) | ORAL | Status: DC

## 2013-05-18 NOTE — Discharge Summary (Signed)
Physician Discharge Summary  Katie Dawson OZH:[id number] DOB: [birth year] DOA: 05/15/2013  PCP: Oliver Barre, MD  Admit date: 05/15/2013 Discharge date: 05/18/2013  Time spent: 35 minutes  Recommendations for Outpatient Follow-up:  Patient will need followup with her primary care physician within 2 days of discharge, at that time she should discuss restarting her hypertensive medications as well as her metformin. These were held due to her acute kidney injury. She should also follow up with physician at the nursing home. She should also have her laboratory work done within 2-3 days of discharge to followup on her kidney function as well as potassium levels. She should also follow up with Dr. Conchita Paris on an as-needed basis.  Discharge Diagnoses:  Principal Problem:   Lower extremity weakness Active Problems:   DIABETES MELLITUS, TYPE II   ARF (acute renal failure)   Leucocytosis   Peripheral neuropathy   Back pain  Discharge Condition: Stable  Diet recommendation: Carb modified, cardiac healthy  Filed Weights   05/15/13 1455 05/15/13 2254 05/16/13 2209  Weight: 104.327 kg (230 lb) 119.205 kg (262 lb 12.8 oz) 119.614 kg (263 lb 11.2 oz)    History of present illness:  Katie Dawson is a 79 y.o. female history of diabetes mellitus type 2, hypertension, hyperlipidemia was brought to the ER by patient's family as patient was found to be increasingly difficult to ambulate. Patient states that he has chronic pain of her lower extremities in her knees and foot but over last one week it has further worsened to the point that she has pain and numbness in the lower extremities which worsens on placing pressure. In the ER in addition patient was found to have leukocytosis but afebrile. Patient's creatinine has increased from baseline. Patient otherwise denies any nausea vomiting abdominal pain diarrhea chest pain shortness of breath productive cough. Patient did defecate on her bed yesterday but  states that she has not lost control of her urine or bowels. Patient has been made for further management.  Hospital Course:  This is a 78 year old female history of type 2 diabetes mellitus, hypertension, hyperlipidemia that was brought to the emergency department by her family for difficulty ambulating. Patient is also been noted to have chronic pain in her lower extremities particularly from her feet to the knees. This pain had worsened over the last week. Patient does state that she uses a walker for ambulation. Patient was admitted. An MRI of the L-spine did show spondylolisthesis at L4-L5 with bilateral lateral recess stenosis which could affect either or both L5 nerves. Central disc bulge which slightly compresses the thecal sac at L3-L4. Neurosurgery was consulted patient was placed on pain control. X-ray of her knees and foot also were conducted showing arthritis. Physical therapy as well as occupational therapy were consulted and recommended nursing home placement. Neurosurgery did see the patient and felt that patient did not need neurosurgical intervention at this time. Patient can follow up with Dr.Nundkumar on as-needed basis. She was started on gabapentin. Patient was also noted to have leukocytosis upon admission. Patient remained afebrile and did not have any source of infection. This also controlled. As for her dementia patient was continued on Aricept. Patient also has a history of diabetes mellitus type 2. She was placed on insulin sliding scale along with CBG monitoring. Again her metformin was held due to her renal injury. Patient was also noted to have acute kidney injury upon admission. Nephrotoxic agents including HCTZ, metformin, and benazepril were held. Her creatinine did  trend downward. Her blood pressure did remain stable. She should speak with her primary care physician regarding restarting these medications. Patient should also have her potassium as well as for creatinine checked  within one week's time. This was discussed with the patient she does understand agree. Patient will be discharged to nursing home. She should also be seen by the physician at the nursing home. She should continue physical activity as tolerated and as recommended by physical therapist as well as occupational therapist at the nursing home.  Procedures: Renal ultrasound: Prior right nephrectomy, no obstructive left uropathy  Consultations: Neurosurgery  Discharge Exam: Filed Vitals:   05/18/13 0950  BP: 138/64  Pulse: 75  Temp: 98.6 F (37 C)  Resp: 19     General: Well developed, well nourished, NAD, appears stated age  HEENT: NCAT, PERRLA, EOMI, Anicteic Sclera, mucous membranes moist. No pharyngeal erythema or exudates  Neck: Supple, no JVD, no masses  Cardiovascular: S1 S2 auscultated, 3/6 systolic ejection murmur Regular rate and rhythm.  Respiratory: Clear to auscultation bilaterally with equal chest rise  Abdomen: Soft, obese, nontender, nondistended, + bowel sounds  Extremities: warm dry without cyanosis clubbing or edema  Neuro: AAOx3, cranial nerves grossly intact.   Skin: Without rashes exudates or nodules  Psych: Normal affect and demeanor with intact judgement and insight  Discharge Instructions  Discharge Orders   Future Orders Complete By Expires   Diet - low sodium heart healthy  As directed    Discharge instructions  As directed    Comments:     Patient will need followup with her primary care physician within 2 days of discharge, at that time she should discuss restarting her hypertensive medications as well as her metformin. These were held due to her acute kidney injury. She should also follow up with physician at the nursing home. She should also have her laboratory work done within 2-3 days of discharge to followup on her kidney function as well as potassium levels.   DME Other see comment  As directed    Comments:     4 wheeled walker with seat.    For home use only DME 4 wheeled rolling walker with seat  As directed    Increase activity slowly  As directed        Medication List    STOP taking these medications       benazepril 40 MG tablet  Commonly known as:  LOTENSIN     hydrochlorothiazide 12.5 MG capsule  Commonly known as:  MICROZIDE     metFORMIN 500 MG tablet  Commonly known as:  GLUCOPHAGE      TAKE these medications       amLODipine 10 MG tablet  Commonly known as:  NORVASC  Take 10 mg by mouth daily.     busPIRone 7.5 MG tablet  Commonly known as:  BUSPAR  Take 7.5 mg by mouth 3 (three) times daily as needed (nerves).     citalopram 10 MG tablet  Commonly known as:  CELEXA  Take 1 tablet (10 mg total) by mouth daily.     cloNIDine 0.1 MG tablet  Commonly known as:  CATAPRES  Take 0.1 mg by mouth 2 (two) times daily.     donepezil 5 MG tablet  Commonly known as:  ARICEPT  Take 1 tablet (5 mg total) by mouth at bedtime as needed.     gabapentin 100 MG capsule  Commonly known as:  NEURONTIN  Take 1 capsule (  100 mg total) by mouth 3 (three) times daily.     glipiZIDE 10 MG 24 hr tablet  Commonly known as:  GLUCOTROL XL  Take 20 mg by mouth daily with breakfast.     HYDROcodone-acetaminophen 5-325 MG per tablet  Commonly known as:  NORCO/VICODIN  Take 1 tablet by mouth every 6 (six) hours as needed for pain.     insulin aspart 100 UNIT/ML injection  Commonly known as:  novoLOG  Inject 0-9 Units into the skin 3 (three) times daily with meals.     insulin glargine 100 UNIT/ML injection  Commonly known as:  LANTUS  Inject 0.6 mLs (60 Units total) into the skin at bedtime.     Iron 325 (65 FE) MG Tabs  Take by mouth daily.     isosorbide mononitrate 60 MG 24 hr tablet  Commonly known as:  IMDUR  Take 60 mg by mouth at bedtime.     labetalol 200 MG tablet  Commonly known as:  NORMODYNE  Take 200 mg by mouth 2 (two) times daily.     pravastatin 40 MG tablet  Commonly known as:  PRAVACHOL   Take 40 mg by mouth daily.       No Active Allergies     Follow-up Information   Follow up with Oliver Barre, MD In 2 days. (for repeat evaluation.)    Specialties:  Internal Medicine, Radiology   Contact information:   [address] [place] [address] [phone]       Follow up with Jackelyn Hoehn, MD. (As needed)    Specialty:  Neurosurgery   Contact information:   [address] [place] [address] [phone]        The results of significant diagnostics from this hospitalization (including imaging, microbiology, ancillary and laboratory) are listed below for reference.    Significant Diagnostic Studies: Dg Chest 1 View  05/15/2013   CLINICAL DATA:  Peripheral neuropathy, leg pain  EXAM: CHEST - 1 VIEW  COMPARISON:  01/02/2012  FINDINGS: Chronic interstitial markings. No frank interstitial edema. No pleural effusion or pneumothorax.  Cardiomegaly.  IMPRESSION: Cardiomegaly with chronic interstitial markings. No frank interstitial edema.   Electronically Signed   By: Charline Bills M.D.   On: 05/15/2013 16:39   Ct Head Wo Contrast  05/15/2013   CLINICAL DATA:  Peripheral neuropathy.  Leg pain.  EXAM: CT HEAD WITHOUT CONTRAST  TECHNIQUE: Contiguous axial images were obtained from the base of the skull through the vertex without intravenous contrast.  COMPARISON:  CT head 12/24/2011  FINDINGS: Generalized atrophy, unchanged. Chronic microvascular ischemic change in the white matter is unchanged from the prior study.  Negative for acute infarct negative for hemorrhage or mass. The calvarium is intact.  IMPRESSION: Atrophy and chronic microvascular ischemic change. No acute abnormality   Electronically Signed   By: Marlan Palau M.D.   On: 05/15/2013 16:35   Mr Lumbar Spine Wo Contrast  05/16/2013   CLINICAL DATA:  Lower extremity pain and weakness.  EXAM: MRI LUMBAR SPINE WITHOUT CONTRAST  TECHNIQUE: Multiplanar, multisequence MR  imaging was performed. No intravenous contrast was administered.  COMPARISON:  None.  FINDINGS: Conus tip at T12-L1.  Evidence of previous right nephrectomy.  T10-11 through T12-L1:  Normal.  L1-2: Tiny disc bulge just to the left of midline with no neural impingement.  L2-3: Small disc bulges into the neural foramina with no neural impingement.  L3-4: Central disc bulge which  slightly compresses the thecal sac. Asymmetric bulge into the left neural foramen without neural impingement. Narrowing of the left lateral recess. Anterior slip of L4 with respect to L3.  L4-5: 3.5 mm spondylolisthesis due to bilateral severe facet arthritis. Small broad-based bulge of the uncovered disc. Hypertrophied ligamentum flavum and facet joints compress both lateral recesses and could affect either or both L5 nerves. Slight spinal stenosis.  L5-S1: Tiny central disc bulge and annular tear with no neural impingement. Moderate right and slight left facet arthritis.  IMPRESSION: 1. Spondylolisthesis at L4-5 with bilateral lateral recess stenosis which could affect either or both L5 nerves. 2. Central disc bulge which slightly compresses the thecal sac at L3-4.   Electronically Signed   By: Geanie Cooley M.D.   On: 05/16/2013 09:19   US Renal  05/16/2013   CLINICAL DATA:  Acute renal failure.  EXAM: RENAL/URINARY TRACT ULTRASOUND COMPLETE  COMPARISON:  None.  FINDINGS: Right Kidney:  Prior nephrectomy.  Left Kidney:  Length: 12.6 cm. Echogenicity within normal limits. No hydronephrosis. There are 2 anechoic left upper pole renal masses measuring 1.1 x 0.8 x 1.3 cm and 1.8 x 1.7 x 2.1 cm respectively.  Bladder:  Appears normal for degree of bladder distention.  IMPRESSION: Prior right nephrectomy.  No obstructive left uropathy.   Electronically Signed   By: Elige Ko   On: 05/16/2013 10:36   Dg Knee Complete 4 Views Left  05/16/2013   CLINICAL DATA:  Bilateral foot and knee pain  EXAM: LEFT KNEE - COMPLETE 4+ VIEW  COMPARISON:   None.  FINDINGS: There is no fracture or dislocation. There is no significant joint effusion. There are tricompartmental degenerative changes most severe in the medial femorotibial compartment with joint space narrowing and marginal osteophytosis. There is generalized osteopenia.  IMPRESSION: No acute osseous injury of the left knee.  Tricompartmental osteoarthritis most severe in the medial femorotibial compartment.   Electronically Signed   By: Elige Ko   On: 05/16/2013 08:16   Dg Knee Complete 4 Views Right  05/16/2013   CLINICAL DATA:  Right knee pain.  EXAM: RIGHT KNEE - COMPLETE 4+ VIEW  COMPARISON:  10/06/2012  FINDINGS: There is a Pellegrini-Stieda lesion consistent with prior injury of the medial collateral ligament. The appearance is unchanged.  There is an old bone infarct in the lateral femoral condyle with tricompartmental osteoarthritis. The findings are unchanged. Decreased small joint effusion since the prior study. Old healed fracture of the proximal fibula.  IMPRESSION: Decreased joint effusions since the study of 10/06/2012. No other change. Tricompartmental osteoarthritis.   Electronically Signed   By: Geanie Cooley M.D.   On: 05/16/2013 08:17   Dg Foot Complete Left  05/16/2013   CLINICAL DATA:  Bilateral foot pain.  EXAM: LEFT FOOT - COMPLETE 3+ VIEW  COMPARISON:  None.  FINDINGS: The left foot demonstrates no fracture or dislocation. There is generalized osteopenia. There is a plantar calcaneal spur. There are enthesopathic changes of the Achilles tendon insertion. There is no soft tissue abnormality. There is no subcutaneous emphysema or radiopaque foreign bodies.  IMPRESSION: No acute osseous injury of the left foot.   Electronically Signed   By: Elige Ko   On: 05/16/2013 08:19   Dg Foot Complete Right  05/16/2013   CLINICAL DATA:  Bilateral knee pain and foot pain.  EXAM: RIGHT FOOT COMPLETE - 3+ VIEW  COMPARISON:  None.  FINDINGS: The right foot demonstrates no fracture  or dislocation. There is generalized  osteopenia. There is a plantar calcaneal spur. There are enthesopathic changes at the Achilles tendon insertion. There are mild degenerative changes at the 1st through 4th TMT joints. There is no soft tissue abnormality. There is no subcutaneous emphysema or radiopaque foreign bodies.  IMPRESSION: No acute osseous injury of the right foot.   Electronically Signed   By: Elige Ko   On: 05/16/2013 08:17    Microbiology: Recent Results (from the past 240 hour(s))  CULTURE, BLOOD (ROUTINE X 2)     Status: None   Collection Time    05/16/13 10:45 AM      Result Value Range Status   Specimen Description BLOOD ARM LEFT   Final   Special Requests BOTTLES DRAWN AEROBIC ONLY 2CC   Final   Culture  Setup Time     Final   Value: 05/16/2013 15:52     Performed at [facility]   Culture     Final   Value:        BLOOD CULTURE RECEIVED NO GROWTH TO DATE CULTURE WILL BE HELD FOR 5 DAYS BEFORE ISSUING A FINAL NEGATIVE REPORT     Performed at [facility]   Report Status PENDING   Incomplete  CULTURE, BLOOD (ROUTINE X 2)     Status: None   Collection Time    05/16/13 10:57 AM      Result Value Range Status   Specimen Description BLOOD HAND LEFT   Final   Special Requests BOTTLES DRAWN AEROBIC AND ANAEROBIC 10CC   Final   Culture  Setup Time     Final   Value: 05/16/2013 15:53     Performed at [facility]   Culture     Final   Value:        BLOOD CULTURE RECEIVED NO GROWTH TO DATE CULTURE WILL BE HELD FOR 5 DAYS BEFORE ISSUING A FINAL NEGATIVE REPORT     Performed at [facility]   Report Status PENDING   Incomplete     Labs: Basic Metabolic Panel:  Recent Labs Lab 05/15/13 1552 05/16/13 0059 05/17/13 1703 05/18/13 0554  NA 142 140 139 144  K 3.3* 3.2* 4.7 5.1  CL 102 101 107 112  CO2 26 26 23 22   GLUCOSE 110* 152* 216* 104*  BUN 18 31* 46* 42*  CREATININE 1.30* 1.86* 1.56* 1.38*  CALCIUM 9.3 9.1 8.8 9.2   Liver  Function Tests:  Recent Labs Lab 05/15/13 1552 05/16/13 0059  AST 21 19  ALT 24 25  ALKPHOS 105 101  BILITOT 1.0 0.6  PROT 7.0 6.9  ALBUMIN 2.9* 2.7*   No results found for this basename: LIPASE, AMYLASE,  in the last 168 hours No results found for this basename: AMMONIA,  in the last 168 hours CBC:  Recent Labs Lab 05/15/13 1552 05/16/13 0059 05/17/13 0952  WBC 16.5* 16.5* 13.4*  NEUTROABS 14.1* 12.4* 10.3*  HGB 11.4* 11.3* 10.8*  HCT 35.3* 34.3* 32.7*  MCV 91.0 91.2 90.3  PLT 280 264 323   Cardiac Enzymes:  Recent Labs Lab 05/16/13 1057  CKTOTAL 108   BNP: BNP (last 3 results) No results found for this basename: PROBNP,  in the last 8760 hours CBG:  Recent Labs Lab 05/17/13 1200 05/17/13 1640 05/17/13 2138 05/18/13 0809 05/18/13 1116  GLUCAP 186* 227* 197* 95 116*       Signed:  Zulay Corrie  [facility] 05/18/2013, 12:44 PM

## 2013-05-18 NOTE — Progress Notes (Signed)
Pt prepared for d/c to SNF. IV d/c'd. Skin intact except as most recently charted. Vitals are stable. Report called to [facility] (receiving facility). Pt to be transported by ambulance service.  Peri Maris, MBA, BS, RN

## 2013-05-18 NOTE — Progress Notes (Signed)
Notified daughter, Talitha Givens, that the patient will be discharging [facility] this evening.  The daughter to be notified once the ambulance picks up the patient.  Peri Maris, MBA, BS, RN

## 2013-05-22 LAB — CULTURE, BLOOD (ROUTINE X 2)

## 2013-06-17 ENCOUNTER — Telehealth: Payer: Self-pay | Admitting: *Deleted

## 2013-06-17 NOTE — Telephone Encounter (Signed)
Katie Dawson with [employer] called (as a courtesy call)  That patient has been discharged from the hospital.  If further questions/concerns, [facility] rep can be reached at [phone] ext [phone].  No callback required otherwise.

## 2013-06-30 ENCOUNTER — Ambulatory Visit: Payer: Medicare HMO | Admitting: Internal Medicine

## 2013-06-30 DIAGNOSIS — Z0289 Encounter for other administrative examinations: Secondary | ICD-10-CM

## 2013-07-12 ENCOUNTER — Other Ambulatory Visit: Payer: Self-pay | Admitting: Internal Medicine

## 2013-07-26 ENCOUNTER — Telehealth: Payer: Self-pay

## 2013-07-26 NOTE — Telephone Encounter (Signed)
[facility] need clarification on if patient is to still be taking Benazepril HCL 40 mg last filled on 04/26/13

## 2013-07-26 NOTE — Telephone Encounter (Signed)
[facility] has been informed of MD instructions on medication.

## 2013-07-26 NOTE — Telephone Encounter (Signed)
Chart review indicates this has been discontinued; no need to take this further at this time

## 2013-07-27 ENCOUNTER — Other Ambulatory Visit: Payer: Self-pay | Admitting: Internal Medicine

## 2013-09-16 ENCOUNTER — Ambulatory Visit (INDEPENDENT_AMBULATORY_CARE_PROVIDER_SITE_OTHER): Payer: PRIVATE HEALTH INSURANCE | Admitting: Internal Medicine

## 2013-09-16 ENCOUNTER — Other Ambulatory Visit: Payer: Self-pay | Admitting: Internal Medicine

## 2013-09-16 ENCOUNTER — Other Ambulatory Visit (INDEPENDENT_AMBULATORY_CARE_PROVIDER_SITE_OTHER): Payer: PRIVATE HEALTH INSURANCE

## 2013-09-16 ENCOUNTER — Encounter: Payer: Self-pay | Admitting: Internal Medicine

## 2013-09-16 VITALS — BP 150/80 | HR 68 | Ht 65.5 in | Wt 260.0 lb

## 2013-09-16 DIAGNOSIS — E1165 Type 2 diabetes mellitus with hyperglycemia: Secondary | ICD-10-CM

## 2013-09-16 DIAGNOSIS — Z0000 Encounter for general adult medical examination without abnormal findings: Secondary | ICD-10-CM

## 2013-09-16 DIAGNOSIS — IMO0001 Reserved for inherently not codable concepts without codable children: Secondary | ICD-10-CM

## 2013-09-16 DIAGNOSIS — E119 Type 2 diabetes mellitus without complications: Secondary | ICD-10-CM

## 2013-09-16 DIAGNOSIS — R451 Restlessness and agitation: Secondary | ICD-10-CM

## 2013-09-16 DIAGNOSIS — IMO0002 Reserved for concepts with insufficient information to code with codable children: Secondary | ICD-10-CM

## 2013-09-16 DIAGNOSIS — F0390 Unspecified dementia without behavioral disturbance: Secondary | ICD-10-CM

## 2013-09-16 DIAGNOSIS — R011 Cardiac murmur, unspecified: Secondary | ICD-10-CM | POA: Insufficient documentation

## 2013-09-16 DIAGNOSIS — Z23 Encounter for immunization: Secondary | ICD-10-CM

## 2013-09-16 LAB — BASIC METABOLIC PANEL
BUN: 18 mg/dL (ref 6–23)
CALCIUM: 9.6 mg/dL (ref 8.4–10.5)
CO2: 30 mEq/L (ref 19–32)
CREATININE: 1 mg/dL (ref 0.4–1.2)
Chloride: 110 mEq/L (ref 96–112)
GFR: 71.52 mL/min (ref >60.00)
GLUCOSE: 101 mg/dL — AB (ref 70–99)
Potassium: 3 mEq/L — ABNORMAL LOW (ref 3.5–5.1)
Sodium: 147 mEq/L — ABNORMAL HIGH (ref 135–145)

## 2013-09-16 LAB — CBC WITH DIFFERENTIAL/PLATELET
Basophils Absolute: 0 10*3/uL (ref 0.0–0.1)
Basophils Relative: 0.4 % (ref 0.0–3.0)
EOS PCT: 2.1 % (ref 0.0–5.0)
Eosinophils Absolute: 0.2 10*3/uL (ref 0.0–0.7)
HEMATOCRIT: 37.9 % (ref 36.0–46.0)
HEMOGLOBIN: 12.4 g/dL (ref 12.0–15.0)
LYMPHS ABS: 1.7 10*3/uL (ref 0.7–4.0)
Lymphocytes Relative: 16.1 % (ref 12.0–46.0)
MCHC: 32.7 g/dL (ref 30.0–36.0)
MCV: 85.8 fl (ref 78.0–100.0)
Monocytes Absolute: 0.5 10*3/uL (ref 0.1–1.0)
Monocytes Relative: 4.9 % (ref 3.0–12.0)
NEUTROS ABS: 8.1 10*3/uL — AB (ref 1.4–7.7)
Neutrophils Relative %: 76.5 % (ref 43.0–77.0)
Platelets: 320 10*3/uL (ref 150.0–400.0)
RBC: 4.42 Mil/uL (ref 3.87–5.11)
RDW: 17.8 % — ABNORMAL HIGH (ref 11.5–14.6)
WBC: 10.6 10*3/uL — AB (ref 4.5–10.5)

## 2013-09-16 LAB — LIPID PANEL
CHOLESTEROL: 226 mg/dL — AB (ref 0–200)
HDL: 32.7 mg/dL — AB (ref >39.00)
LDL Cholesterol: 164 mg/dL — ABNORMAL HIGH (ref 0–99)
TRIGLYCERIDES: 147 mg/dL (ref 0.0–149.0)
Total CHOL/HDL Ratio: 7
VLDL: 29.4 mg/dL (ref 0.0–40.0)

## 2013-09-16 LAB — HEPATIC FUNCTION PANEL
ALT: 15 U/L (ref 0–35)
AST: 12 U/L (ref 0–37)
Albumin: 3 g/dL — ABNORMAL LOW (ref 3.5–5.2)
Alkaline Phosphatase: 66 U/L (ref 39–117)
BILIRUBIN TOTAL: 0.6 mg/dL (ref 0.3–1.2)
Bilirubin, Direct: 0 mg/dL (ref 0.0–0.3)
Total Protein: 6.6 g/dL (ref 6.0–8.3)

## 2013-09-16 LAB — HEMOGLOBIN A1C: HEMOGLOBIN A1C: 6.9 % — AB (ref 4.6–6.5)

## 2013-09-16 LAB — TSH: TSH: 3.02 u[IU]/mL (ref 0.35–5.50)

## 2013-09-16 MED ORDER — PRAVASTATIN SODIUM 40 MG PO TABS: 40.0000 mg | ORAL_TABLET | Freq: Every day | ORAL | Status: DC

## 2013-09-16 MED ORDER — DONEPEZIL HCL 10 MG PO TABS: 10.0000 mg | ORAL_TABLET | Freq: Every day | ORAL | Status: DC

## 2013-09-16 MED ORDER — LANCETS MISC: Status: DC

## 2013-09-16 MED ORDER — RISPERIDONE 0.25 MG PO TABS: ORAL_TABLET | ORAL | Status: DC

## 2013-09-16 MED ORDER — GLUCOSE BLOOD VI STRP: ORAL_STRIP | Status: DC

## 2013-09-16 MED ORDER — POTASSIUM CHLORIDE ER 10 MEQ PO TBCR: 10.0000 meq | EXTENDED_RELEASE_TABLET | Freq: Every day | ORAL | Status: DC

## 2013-09-16 NOTE — Assessment & Plan Note (Addendum)
For risperidone asd,  to f/u any worsening symptoms or concerns

## 2013-09-16 NOTE — Assessment & Plan Note (Signed)
Gradual worsenng as well, for increased aricept 10 qd

## 2013-09-16 NOTE — Patient Instructions (Addendum)
You had the new Prevnar pneumonia shot today  You are given the handicap parking application today  You are given the glucometer, and the strips are sent to your pharmacy  OK to increase the aricept to 10 mg per day  Please take all new medication as prescribed - the risperdal to help with sleep   Please continue all other medications as before, and refills have been done if requested. Please have the pharmacy call with any other refills you may need.  Please continue your efforts at being more active, low cholesterol diet, and weight control. You are otherwise up to date with prevention measures today.  Please remember to sign up for MyChart if you have not done so, as this will be important to you in the future with finding out test results, communicating by private email, and scheduling acute appointments online when needed.  Please go to the LAB in the Basement (turn left off the elevator) for the tests to be done today  You will be contacted by phone if any changes need to be made immediately.  Otherwise, you will receive a letter about your results with an explanation, but please check with MyChart first.  Please return in 6 months, or sooner if needed  You will be contacted regarding the referral for: echocardiogram

## 2013-09-16 NOTE — Assessment & Plan Note (Signed)
?   Worsening AS - for echo, has hx of recurring dizziness, did not have echo done last visit

## 2013-09-16 NOTE — Progress Notes (Signed)
Pre visit review using our clinic review tool, if applicable. No additional management support is needed unless otherwise documented below in the visit note. 

## 2013-09-16 NOTE — Assessment & Plan Note (Signed)

## 2013-09-16 NOTE — Assessment & Plan Note (Signed)
stable overall by history and exam, recent data reviewed with pt, and pt to continue medical treatment as before,  to f/u any worsening symptoms or concerns Lab Results  Component Value Date   HGBA1C 6.9* 09/16/2013

## 2013-09-16 NOTE — Progress Notes (Signed)
Subjective:    Patient ID: Katie Dawson, female    DOB: 1933-12-21, 80 y.o.   MRN: [id number]  HPI  Here for wellness and f/u;  Overall doing ok;  Pt denies CP, worsening SOB, DOE, wheezing, orthopnea, PND, worsening LE edema, palpitations, dizziness or syncope.  Pt denies neurological change such as new headache, facial or extremity weakness.  Pt denies polydipsia, polyuria, or low sugar symptoms. Pt states overall good compliance with treatment and medications, good tolerability, and has been trying to follow lower cholesterol diet.  Pt denies worsening depressive symptoms, suicidal ideation or panic. No fever, night sweats, wt loss, loss of appetite, or other constitutional symptoms.  Pt states good ability with ADL's, has low fall risk, home safety reviewed and adequate, no other significant changes in hearing or vision, and only occasionally active with exercise.  Needs new glucometer, and supplies, also handicap parking form. Lives with a sister of family with her today.  Pt with worsening ST memory loss, occas agitation, wandering and does not sleep at night.   Past Medical History  Diagnosis Date  . DIABETES MELLITUS, TYPE II 04/22/2007  . HYPERLIPIDEMIA 04/22/2007  . GOUT 04/22/2007  . ANEMIA-IRON DEFICIENCY 04/22/2007  . ANXIETY 04/22/2007  . OBSTRUCTIVE SLEEP APNEA 04/22/2007  . HYPERTENSION 04/22/2007  . GERD 04/22/2007  . RENAL INSUFFICIENCY 04/22/2007  . 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 FOOT PAIN, LEFT 12/23/2007  . OSTEOPENIA 02/12/2009  . INTERMITTENT VERTIGO 04/20/2009  . HYPERSOMNIA 01/11/2009  . COLONIC POLYPS, HX OF 04/22/2007  . NEPHRECTOMY, HX OF 04/22/2007  . Urinary tract infection     hx of  . Right fibular fracture July 2013   Past Surgical History  Procedure Laterality Date  . S/p right nephrectomy    . S/p parathyroid surgury    . Orif wrist fracture  01/02/2012    Procedure: OPEN REDUCTION INTERNAL FIXATION (ORIF) WRIST FRACTURE;  Surgeon:  Schuyler Amor, MD;  Location: [facility];  Service: Orthopedics;  Laterality: Right;  Open Reduction Internal Fixation Right Distal Radius     reports that she has never smoked. She has never used smokeless tobacco. She reports that she does not drink alcohol or use illicit drugs. family history includes Heart disease in her mother. No Known Allergies Current Outpatient Prescriptions on File Prior to Visit  Medication Sig Dispense Refill  . amLODipine (NORVASC) 10 MG tablet Take 10 mg by mouth daily.      . BD PEN NEEDLE NANO U/F 32G X 4 MM MISC USE WITH LANTUS DAILY.  100 each  6  . busPIRone (BUSPAR) 7.5 MG tablet Take 7.5 mg by mouth 3 (three) times daily as needed (nerves).      . citalopram (CELEXA) 10 MG tablet TAKE 1 TABLET EACH DAY.  30 tablet  11  . cloNIDine (CATAPRES) 0.1 MG tablet Take 0.1 mg by mouth 2 (two) times daily.      . Ferrous Sulfate (IRON) 325 (65 FE) MG TABS Take by mouth daily.        Marland Kitchen gabapentin (NEURONTIN) 100 MG capsule Take 1 capsule (100 mg total) by mouth 3 (three) times daily.      Marland Kitchen glipiZIDE (GLUCOTROL XL) 10 MG 24 hr tablet Take 20 mg by mouth daily with breakfast.      . HYDROcodone-acetaminophen (NORCO/VICODIN) 5-325 MG per tablet Take 1 tablet by mouth every 6 (six) hours as needed for pain.  60 tablet  1  . insulin aspart (NOVOLOG)  100 UNIT/ML injection Inject 0-9 Units into the skin 3 (three) times daily with meals.  1 vial  12  . insulin glargine (LANTUS) 100 UNIT/ML injection Inject 0.6 mLs (60 Units total) into the skin at bedtime.  10 mL  11  . isosorbide mononitrate (IMDUR) 60 MG 24 hr tablet Take 60 mg by mouth at bedtime.      Marland Kitchen labetalol (NORMODYNE) 200 MG tablet Take 200 mg by mouth 2 (two) times daily.       No current facility-administered medications on file prior to visit.   Review of Systems Constitutional: Negative for diaphoresis, activity change, appetite change or unexpected weight change.  HENT: Negative for hearing loss, ear pain,  facial swelling, mouth sores and neck stiffness.   Eyes: Negative for pain, redness and visual disturbance.  Respiratory: Negative for shortness of breath and wheezing.   Cardiovascular: Negative for chest pain and palpitations.  Gastrointestinal: Negative for diarrhea, blood in stool, abdominal distention or other pain Genitourinary: Negative for hematuria, flank pain or change in urine volume.  Musculoskeletal: Negative for myalgias and joint swelling.  Skin: Negative for color change and wound.  Neurological: Negative for syncope and numbness. other than noted Hematological: Negative for adenopathy.  Psychiatric/Behavioral: Negative for hallucinations, self-injury, decreased concentration and agitation.      Objective:   Physical Exam BP 150/80  Pulse 68  Ht 5' 5.5" (1.664 m)  Wt 260 lb (117.935 kg)  BMI 42.59 kg/m2 VS noted,  Constitutional: Pt appears well-developed and well-nourished.  HENT: Head: NCAT.  Right Ear: External ear normal.  Left Ear: External ear normal.  Eyes: Conjunctivae and EOM are normal. Pupils are equal, round, and reactive to light.  Neck: Normal range of motion. Neck supple.  Cardiovascular: Normal rate and regular rhythm.  with gr 2/6 sys murmur LUSB Pulmonary/Chest: Effort normal and breath sounds normal.  Abd:  Soft, NT, non-distended, + BS Neurological: Pt is alert. + confused  Skin: Skin is warm. No erythema.  Psychiatric: Pt behavior is non agitated at this time    Assessment & Plan:

## 2013-10-16 ENCOUNTER — Emergency Department (HOSPITAL_COMMUNITY): Payer: PRIVATE HEALTH INSURANCE

## 2013-10-16 ENCOUNTER — Encounter (HOSPITAL_COMMUNITY): Payer: Self-pay | Admitting: Emergency Medicine

## 2013-10-16 ENCOUNTER — Inpatient Hospital Stay (HOSPITAL_COMMUNITY)
Admission: EM | Admit: 2013-10-16 | Discharge: 2013-10-20 | DRG: 638 | Disposition: A | Discharge status: Skilled Nursing Facility | Payer: PRIVATE HEALTH INSURANCE | Attending: Internal Medicine | Admitting: Internal Medicine

## 2013-10-16 DIAGNOSIS — K219 Gastro-esophageal reflux disease without esophagitis: Secondary | ICD-10-CM | POA: Diagnosis present

## 2013-10-16 DIAGNOSIS — I5032 Chronic diastolic (congestive) heart failure: Secondary | ICD-10-CM | POA: Diagnosis present

## 2013-10-16 DIAGNOSIS — E785 Hyperlipidemia, unspecified: Secondary | ICD-10-CM | POA: Diagnosis present

## 2013-10-16 DIAGNOSIS — N179 Acute kidney failure, unspecified: Secondary | ICD-10-CM

## 2013-10-16 DIAGNOSIS — I10 Essential (primary) hypertension: Secondary | ICD-10-CM | POA: Diagnosis present

## 2013-10-16 DIAGNOSIS — F329 Major depressive disorder, single episode, unspecified: Secondary | ICD-10-CM

## 2013-10-16 DIAGNOSIS — M109 Gout, unspecified: Secondary | ICD-10-CM | POA: Diagnosis present

## 2013-10-16 DIAGNOSIS — N183 Chronic kidney disease, stage 3 unspecified: Secondary | ICD-10-CM | POA: Diagnosis present

## 2013-10-16 DIAGNOSIS — R262 Difficulty in walking, not elsewhere classified: Secondary | ICD-10-CM | POA: Diagnosis present

## 2013-10-16 DIAGNOSIS — E279 Disorder of adrenal gland, unspecified: Secondary | ICD-10-CM | POA: Diagnosis present

## 2013-10-16 DIAGNOSIS — G609 Hereditary and idiopathic neuropathy, unspecified: Secondary | ICD-10-CM | POA: Diagnosis present

## 2013-10-16 DIAGNOSIS — R410 Disorientation, unspecified: Secondary | ICD-10-CM

## 2013-10-16 DIAGNOSIS — C7970 Secondary malignant neoplasm of unspecified adrenal gland: Secondary | ICD-10-CM | POA: Diagnosis present

## 2013-10-16 DIAGNOSIS — R599 Enlarged lymph nodes, unspecified: Secondary | ICD-10-CM | POA: Diagnosis present

## 2013-10-16 DIAGNOSIS — F3289 Other specified depressive episodes: Secondary | ICD-10-CM | POA: Diagnosis present

## 2013-10-16 DIAGNOSIS — Z794 Long term (current) use of insulin: Secondary | ICD-10-CM

## 2013-10-16 DIAGNOSIS — E119 Type 2 diabetes mellitus without complications: Secondary | ICD-10-CM

## 2013-10-16 DIAGNOSIS — Z905 Acquired absence of kidney: Secondary | ICD-10-CM

## 2013-10-16 DIAGNOSIS — R32 Unspecified urinary incontinence: Secondary | ICD-10-CM | POA: Diagnosis present

## 2013-10-16 DIAGNOSIS — D509 Iron deficiency anemia, unspecified: Secondary | ICD-10-CM | POA: Diagnosis present

## 2013-10-16 DIAGNOSIS — I129 Hypertensive chronic kidney disease with stage 1 through stage 4 chronic kidney disease, or unspecified chronic kidney disease: Secondary | ICD-10-CM | POA: Diagnosis present

## 2013-10-16 DIAGNOSIS — F0390 Unspecified dementia without behavioral disturbance: Secondary | ICD-10-CM | POA: Diagnosis present

## 2013-10-16 DIAGNOSIS — G629 Polyneuropathy, unspecified: Secondary | ICD-10-CM | POA: Diagnosis present

## 2013-10-16 DIAGNOSIS — A498 Other bacterial infections of unspecified site: Secondary | ICD-10-CM | POA: Diagnosis present

## 2013-10-16 DIAGNOSIS — R451 Restlessness and agitation: Secondary | ICD-10-CM

## 2013-10-16 DIAGNOSIS — E876 Hypokalemia: Secondary | ICD-10-CM

## 2013-10-16 DIAGNOSIS — E162 Hypoglycemia, unspecified: Secondary | ICD-10-CM | POA: Diagnosis present

## 2013-10-16 DIAGNOSIS — N390 Urinary tract infection, site not specified: Secondary | ICD-10-CM | POA: Diagnosis present

## 2013-10-16 DIAGNOSIS — IMO0001 Reserved for inherently not codable concepts without codable children: Secondary | ICD-10-CM | POA: Diagnosis present

## 2013-10-16 DIAGNOSIS — R590 Localized enlarged lymph nodes: Secondary | ICD-10-CM

## 2013-10-16 DIAGNOSIS — F32A Depression, unspecified: Secondary | ICD-10-CM | POA: Diagnosis present

## 2013-10-16 DIAGNOSIS — C7800 Secondary malignant neoplasm of unspecified lung: Secondary | ICD-10-CM | POA: Diagnosis present

## 2013-10-16 DIAGNOSIS — C649 Malignant neoplasm of unspecified kidney, except renal pelvis: Secondary | ICD-10-CM | POA: Diagnosis present

## 2013-10-16 DIAGNOSIS — R4182 Altered mental status, unspecified: Secondary | ICD-10-CM

## 2013-10-16 DIAGNOSIS — E1169 Type 2 diabetes mellitus with other specified complication: Principal | ICD-10-CM | POA: Diagnosis present

## 2013-10-16 DIAGNOSIS — F411 Generalized anxiety disorder: Secondary | ICD-10-CM | POA: Diagnosis present

## 2013-10-16 DIAGNOSIS — G9340 Encephalopathy, unspecified: Secondary | ICD-10-CM | POA: Diagnosis present

## 2013-10-16 LAB — URINALYSIS, ROUTINE W REFLEX MICROSCOPIC
BILIRUBIN URINE: NEGATIVE
Bilirubin Urine: NEGATIVE
GLUCOSE, UA: NEGATIVE mg/dL
GLUCOSE, UA: NEGATIVE mg/dL
KETONES UR: NEGATIVE mg/dL
Ketones, ur: NEGATIVE mg/dL
Nitrite: POSITIVE — AB
Nitrite: POSITIVE — AB
PH: 5 (ref 5.0–8.0)
Protein, ur: >300 mg/dL — AB
Protein, ur: >300 mg/dL — AB
SPECIFIC GRAVITY, URINE: 1.019 (ref 1.005–1.030)
Specific Gravity, Urine: 1.019 (ref 1.005–1.030)
UROBILINOGEN UA: 0.2 mg/dL (ref 0.0–1.0)
Urobilinogen, UA: 1 mg/dL (ref 0.0–1.0)
pH: 5 (ref 5.0–8.0)

## 2013-10-16 LAB — COMPREHENSIVE METABOLIC PANEL
ALBUMIN: 2.5 g/dL — AB (ref 3.5–5.2)
ALT: 8 U/L (ref 0–35)
AST: 9 U/L (ref 0–37)
Alkaline Phosphatase: 81 U/L (ref 39–117)
BUN: 13 mg/dL (ref 6–23)
CALCIUM: 9.4 mg/dL (ref 8.4–10.5)
CHLORIDE: 108 meq/L (ref 96–112)
CO2: 29 meq/L (ref 19–32)
Creatinine, Ser: 1.15 mg/dL — ABNORMAL HIGH (ref 0.50–1.10)
GFR calc Af Amer: 52 mL/min — ABNORMAL LOW (ref >90)
GFR, EST NON AFRICAN AMERICAN: 45 mL/min — AB (ref >90)
Glucose, Bld: 48 mg/dL — ABNORMAL LOW (ref 70–99)
Potassium: 2.9 mEq/L — CL (ref 3.7–5.3)
Sodium: 149 mEq/L — ABNORMAL HIGH (ref 137–147)
Total Bilirubin: 0.4 mg/dL (ref 0.3–1.2)
Total Protein: 6.6 g/dL (ref 6.0–8.3)

## 2013-10-16 LAB — CBC WITH DIFFERENTIAL/PLATELET
BASOS ABS: 0 10*3/uL (ref 0.0–0.1)
BASOS PCT: 0 % (ref 0–1)
EOS PCT: 1 % (ref 0–5)
Eosinophils Absolute: 0.2 10*3/uL (ref 0.0–0.7)
HCT: 36.6 % (ref 36.0–46.0)
Hemoglobin: 11.7 g/dL — ABNORMAL LOW (ref 12.0–15.0)
LYMPHS PCT: 13 % (ref 12–46)
Lymphs Abs: 1.6 10*3/uL (ref 0.7–4.0)
MCH: 28.5 pg (ref 26.0–34.0)
MCHC: 32 g/dL (ref 30.0–36.0)
MCV: 89.1 fL (ref 78.0–100.0)
Monocytes Absolute: 1.2 10*3/uL — ABNORMAL HIGH (ref 0.1–1.0)
Monocytes Relative: 10 % (ref 3–12)
Neutro Abs: 9.4 10*3/uL — ABNORMAL HIGH (ref 1.7–7.7)
Neutrophils Relative %: 76 % (ref 43–77)
PLATELETS: 340 10*3/uL (ref 150–400)
RBC: 4.11 MIL/uL (ref 3.87–5.11)
RDW: 16.4 % — AB (ref 11.5–15.5)
WBC: 12.5 10*3/uL — AB (ref 4.0–10.5)

## 2013-10-16 LAB — I-STAT CHEM 8, ED
BUN: 11 mg/dL (ref 6–23)
Calcium, Ion: 1.23 mmol/L (ref 1.13–1.30)
Chloride: 107 mEq/L (ref 96–112)
Creatinine, Ser: 1.3 mg/dL — ABNORMAL HIGH (ref 0.50–1.10)
Glucose, Bld: 52 mg/dL — ABNORMAL LOW (ref 70–99)
HEMATOCRIT: 37 % (ref 36.0–46.0)
Hemoglobin: 12.6 g/dL (ref 12.0–15.0)
POTASSIUM: 2.7 meq/L — AB (ref 3.7–5.3)
SODIUM: 149 meq/L — AB (ref 137–147)
TCO2: 27 mmol/L (ref 0–100)

## 2013-10-16 LAB — CBG MONITORING, ED
GLUCOSE-CAPILLARY: 49 mg/dL — AB (ref 70–99)
GLUCOSE-CAPILLARY: 183 mg/dL — AB (ref 70–99)
GLUCOSE-CAPILLARY: 124 mg/dL — AB (ref 70–99)
GLUCOSE-CAPILLARY: 123 mg/dL — AB (ref 70–99)
GLUCOSE-CAPILLARY: 90 mg/dL (ref 70–99)

## 2013-10-16 LAB — GLUCOSE, CAPILLARY
GLUCOSE-CAPILLARY: 142 mg/dL — AB (ref 70–99)
GLUCOSE-CAPILLARY: 104 mg/dL — AB (ref 70–99)
Glucose-Capillary: 181 mg/dL — ABNORMAL HIGH (ref 70–99)
Glucose-Capillary: 214 mg/dL — ABNORMAL HIGH (ref 70–99)

## 2013-10-16 LAB — URINE MICROSCOPIC-ADD ON

## 2013-10-16 LAB — PRO B NATRIURETIC PEPTIDE: PRO B NATRI PEPTIDE: 570.2 pg/mL — AB (ref 0–450)

## 2013-10-16 LAB — URIC ACID: URIC ACID, SERUM: 9.5 mg/dL — AB (ref 2.4–7.0)

## 2013-10-16 LAB — I-STAT CG4 LACTIC ACID, ED: LACTIC ACID, VENOUS: 0.95 mmol/L (ref 0.5–2.2)

## 2013-10-16 MED ORDER — SODIUM CHLORIDE 0.9 % IJ SOLN
3.0000 mL | Freq: Two times a day (BID) | INTRAMUSCULAR | Status: DC
  Administered 2013-10-17 – 2013-10-20 (×8): 3 mL via INTRAVENOUS

## 2013-10-16 MED ORDER — MORPHINE SULFATE 2 MG/ML IJ SOLN: 1.0000 mg | INTRAMUSCULAR | Status: DC | PRN

## 2013-10-16 MED ORDER — SODIUM CHLORIDE 0.9 % IV SOLN
INTRAVENOUS | Status: AC
  Administered 2013-10-16: 15:00:00 via INTRAVENOUS

## 2013-10-16 MED ORDER — GABAPENTIN 100 MG PO CAPS
100.0000 mg | ORAL_CAPSULE | Freq: Three times a day (TID) | ORAL | Status: DC
  Administered 2013-10-17 – 2013-10-20 (×11): 100 mg via ORAL
  Filled 2013-10-16 (×15): qty 1

## 2013-10-16 MED ORDER — DEXTROSE-NACL 5-0.9 % IV SOLN
INTRAVENOUS | Status: DC
  Administered 2013-10-16 – 2013-10-17 (×2): via INTRAVENOUS

## 2013-10-16 MED ORDER — POTASSIUM CHLORIDE 10 MEQ/100ML IV SOLN
10.0000 meq | Freq: Once | INTRAVENOUS | Status: AC
Start: 2013-10-16 — End: 2013-10-16
  Administered 2013-10-16: 10 meq via INTRAVENOUS
  Filled 2013-10-16: qty 100

## 2013-10-16 MED ORDER — ONDANSETRON HCL 4 MG/2ML IJ SOLN: 4.0000 mg | Freq: Four times a day (QID) | INTRAMUSCULAR | Status: DC | PRN

## 2013-10-16 MED ORDER — DEXTROSE 50 % IV SOLN
INTRAVENOUS | Status: AC
  Administered 2013-10-16: 50 mL via INTRAVENOUS
  Filled 2013-10-16: qty 50

## 2013-10-16 MED ORDER — DEXTROSE 5 % IV SOLN
1.0000 g | INTRAVENOUS | Status: DC
  Filled 2013-10-16: qty 10

## 2013-10-16 MED ORDER — CLONIDINE HCL 0.1 MG PO TABS
0.1000 mg | ORAL_TABLET | Freq: Two times a day (BID) | ORAL | Status: DC
  Administered 2013-10-17 – 2013-10-20 (×8): 0.1 mg via ORAL
  Filled 2013-10-16 (×11): qty 1

## 2013-10-16 MED ORDER — DEXTROSE 5 % IV SOLN
1.0000 g | INTRAVENOUS | Status: DC
  Administered 2013-10-17 – 2013-10-18 (×2): 1 g via INTRAVENOUS
  Filled 2013-10-16 (×3): qty 10

## 2013-10-16 MED ORDER — ONDANSETRON HCL 4 MG PO TABS: 4.0000 mg | ORAL_TABLET | Freq: Four times a day (QID) | ORAL | Status: DC | PRN

## 2013-10-16 MED ORDER — ENOXAPARIN SODIUM 40 MG/0.4ML ~~LOC~~ SOLN
40.0000 mg | SUBCUTANEOUS | Status: DC
  Administered 2013-10-17 – 2013-10-19 (×4): 40 mg via SUBCUTANEOUS
  Filled 2013-10-16 (×6): qty 0.4

## 2013-10-16 MED ORDER — POTASSIUM CHLORIDE 10 MEQ/100ML IV SOLN
10.0000 meq | Freq: Once | INTRAVENOUS | Status: AC
  Administered 2013-10-16: 10 meq via INTRAVENOUS

## 2013-10-16 MED ORDER — LABETALOL HCL 200 MG PO TABS
200.0000 mg | ORAL_TABLET | Freq: Two times a day (BID) | ORAL | Status: DC
  Administered 2013-10-17 – 2013-10-20 (×8): 200 mg via ORAL
  Filled 2013-10-16 (×11): qty 1

## 2013-10-16 MED ORDER — SODIUM CHLORIDE 0.9 % IV SOLN: INTRAVENOUS | Status: DC

## 2013-10-16 MED ORDER — SIMVASTATIN 20 MG PO TABS
20.0000 mg | ORAL_TABLET | Freq: Every day | ORAL | Status: DC
  Administered 2013-10-16 – 2013-10-19 (×4): 20 mg via ORAL
  Filled 2013-10-16 (×5): qty 1

## 2013-10-16 MED ORDER — DEXTROSE 50 % IV SOLN
1.0000 | Freq: Once | INTRAVENOUS | Status: AC
  Administered 2013-10-16: 50 mL via INTRAVENOUS

## 2013-10-16 MED ORDER — RISPERIDONE 0.5 MG PO TABS
0.5000 mg | ORAL_TABLET | Freq: Every day | ORAL | Status: DC
  Administered 2013-10-17 – 2013-10-19 (×4): 0.5 mg via ORAL
  Filled 2013-10-16 (×7): qty 1

## 2013-10-16 MED ORDER — AMLODIPINE BESYLATE 10 MG PO TABS
10.0000 mg | ORAL_TABLET | Freq: Every day | ORAL | Status: DC
  Administered 2013-10-16 – 2013-10-20 (×5): 10 mg via ORAL
  Filled 2013-10-16 (×6): qty 1

## 2013-10-16 MED ORDER — ACETAMINOPHEN 325 MG PO TABS: 650.0000 mg | ORAL_TABLET | Freq: Four times a day (QID) | ORAL | Status: DC | PRN

## 2013-10-16 MED ORDER — DEXTROSE 5 % IV SOLN
1.0000 g | Freq: Once | INTRAVENOUS | Status: AC
  Administered 2013-10-16: 1 g via INTRAVENOUS
  Filled 2013-10-16: qty 10

## 2013-10-16 MED ORDER — ALBUTEROL SULFATE (2.5 MG/3ML) 0.083% IN NEBU: 2.5000 mg | INHALATION_SOLUTION | RESPIRATORY_TRACT | Status: DC | PRN

## 2013-10-16 MED ORDER — ISOSORBIDE MONONITRATE ER 60 MG PO TB24
60.0000 mg | ORAL_TABLET | Freq: Every day | ORAL | Status: DC
  Administered 2013-10-17 – 2013-10-19 (×4): 60 mg via ORAL
  Filled 2013-10-16 (×7): qty 1

## 2013-10-16 MED ORDER — ALUM & MAG HYDROXIDE-SIMETH 200-200-20 MG/5ML PO SUSP: 30.0000 mL | Freq: Four times a day (QID) | ORAL | Status: DC | PRN

## 2013-10-16 MED ORDER — SODIUM CHLORIDE 0.9 % IV BOLUS (SEPSIS)
250.0000 mL | Freq: Once | INTRAVENOUS | Status: AC
  Administered 2013-10-16: 250 mL via INTRAVENOUS

## 2013-10-16 MED ORDER — GUAIFENESIN-DM 100-10 MG/5ML PO SYRP: 5.0000 mL | ORAL_SOLUTION | ORAL | Status: DC | PRN

## 2013-10-16 MED ORDER — POTASSIUM CHLORIDE CRYS ER 20 MEQ PO TBCR
40.0000 meq | EXTENDED_RELEASE_TABLET | Freq: Once | ORAL | Status: AC
  Administered 2013-10-16: 40 meq via ORAL
  Filled 2013-10-16: qty 2

## 2013-10-16 MED ORDER — RISPERIDONE 0.25 MG PO TABS
0.2500 mg | ORAL_TABLET | Freq: Every day | ORAL | Status: DC
  Administered 2013-10-17 – 2013-10-20 (×4): 0.25 mg via ORAL
  Filled 2013-10-16 (×4): qty 1

## 2013-10-16 MED ORDER — PREDNISONE 20 MG PO TABS
40.0000 mg | ORAL_TABLET | Freq: Once | ORAL | Status: AC
  Administered 2013-10-16: 40 mg via ORAL
  Filled 2013-10-16: qty 2

## 2013-10-16 MED ORDER — DONEPEZIL HCL 10 MG PO TABS
10.0000 mg | ORAL_TABLET | Freq: Every day | ORAL | Status: DC
  Administered 2013-10-17 – 2013-10-19 (×4): 10 mg via ORAL
  Filled 2013-10-16 (×6): qty 1

## 2013-10-16 MED ORDER — BUSPIRONE HCL 15 MG PO TABS
7.5000 mg | ORAL_TABLET | Freq: Three times a day (TID) | ORAL | Status: DC | PRN
  Filled 2013-10-16: qty 1

## 2013-10-16 MED ORDER — OXYCODONE HCL 5 MG PO TABS: 5.0000 mg | ORAL_TABLET | ORAL | Status: DC | PRN

## 2013-10-16 MED ORDER — ACETAMINOPHEN 650 MG RE SUPP: 650.0000 mg | Freq: Four times a day (QID) | RECTAL | Status: DC | PRN

## 2013-10-16 MED ORDER — POTASSIUM CHLORIDE 10 MEQ/100ML IV SOLN
10.0000 meq | Freq: Once | INTRAVENOUS | Status: DC
  Filled 2013-10-16: qty 100

## 2013-10-16 NOTE — ED Notes (Addendum)
Pt arrived by [facility]. Pt normally lives with daughter but has been with other daughter since Friday. Daughter at bedside. Pt has had increased Weakness since Friday. Normally able to walk with walker with assistance but this morning daughter unable to help pt up. EMS arrived noted CBG 40, administered juice and oral Glucose CBG increased to 62. CBG check upon arrival to ED and CBG 49. Pt is responsive with some confusion. Pt also c/o left arm pain that has been hurting for a while but unsure of what caused this pain. Denies any falls or injuries.

## 2013-10-16 NOTE — ED Provider Notes (Signed)
CSN: [id number]     Arrival date & time 10/16/13  0806 History   First MD Initiated Contact with Patient 10/16/13 [phone]     Chief Complaint  Patient presents with  . Hypoglycemia  . Weakness     (Consider location/radiation/quality/duration/timing/severity/associated sxs/prior Treatment) The history is provided by the patient, the EMS personnel and a relative.   80 year old female brought in by EMS for low blood sugar. They were called for weakness. And confusion. Patient given oral glucose by EMS at the scene blood sugar there was 40. Increased at 62. Upon arrival to emergency department was down to 49. Patient remained responsive but had confusion. Patient May complaining of left arm pain for the past 2 days. Family seems to feel is a more confusion and weakness on her part that started at least yesterday. Patient has a history of dementia. Is on Aricept. Patient has a history of diabetes. Family states she's been eating less food the last couple days.  Past Medical History  Diagnosis Date  . DIABETES MELLITUS, TYPE II 04/22/2007  . HYPERLIPIDEMIA 04/22/2007  . GOUT 04/22/2007  . ANEMIA-IRON DEFICIENCY 04/22/2007  . ANXIETY 04/22/2007  . OBSTRUCTIVE SLEEP APNEA 04/22/2007  . HYPERTENSION 04/22/2007  . GERD 04/22/2007  . RENAL INSUFFICIENCY 04/22/2007  . 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 FOOT PAIN, LEFT 12/23/2007  . OSTEOPENIA 02/12/2009  . INTERMITTENT VERTIGO 04/20/2009  . HYPERSOMNIA 01/11/2009  . COLONIC POLYPS, HX OF 04/22/2007  . NEPHRECTOMY, HX OF 04/22/2007  . Urinary tract infection     hx of  . Right fibular fracture July 2013   Past Surgical History  Procedure Laterality Date  . S/p right nephrectomy    . S/p parathyroid surgury    . Orif wrist fracture  01/02/2012    Procedure: OPEN REDUCTION INTERNAL FIXATION (ORIF) WRIST FRACTURE;  Surgeon: Schuyler Amor, MD;  Location: [place];  Service: Orthopedics;  Laterality: Right;  Open Reduction Internal  Fixation Right Distal Radius    Family History  Problem Relation Age of Onset  . Heart disease Mother    History  Substance Use Topics  . Smoking status: Never Smoker   . Smokeless tobacco: Never Used  . Alcohol Use: No   OB History   Grav Para Term Preterm Abortions TAB SAB Ect Mult Living                 Review of Systems  Unable to perform ROS  level V caveat applies due to patient's history of dementia.    Allergies  Review of patient's allergies indicates no known allergies.  Home Medications   Prior to Admission medications   Medication Sig Start Date End Date Taking? Authorizing Provider  amLODipine (NORVASC) 10 MG tablet Take 10 mg by mouth daily.    Historical Provider, MD  BD PEN NEEDLE NANO U/F 32G X 4 MM MISC USE WITH LANTUS DAILY.    Biagio Borg, MD  busPIRone (BUSPAR) 7.5 MG tablet Take 7.5 mg by mouth 3 (three) times daily as needed (nerves).    Historical Provider, MD  citalopram (CELEXA) 10 MG tablet TAKE 1 TABLET EACH DAY. 07/27/13   Biagio Borg, MD  cloNIDine (CATAPRES) 0.1 MG tablet Take 0.1 mg by mouth 2 (two) times daily.    Historical Provider, MD  donepezil (ARICEPT) 10 MG tablet Take 1 tablet (10 mg total) by mouth at bedtime. 09/16/13   Biagio Borg, MD  Ferrous Sulfate (IRON) 325 (65 FE) MG  TABS Take by mouth daily.      Historical Provider, MD  gabapentin (NEURONTIN) 100 MG capsule Take 1 capsule (100 mg total) by mouth 3 (three) times daily. 05/18/13   Maryann Mikhail, DO  glipiZIDE (GLUCOTROL XL) 10 MG 24 hr tablet Take 20 mg by mouth daily with breakfast.    Historical Provider, MD  glucose blood (BAYER CONTOUR NEXT TEST) test strip Use as instructed 09/16/13   Biagio Borg, MD  HYDROcodone-acetaminophen (NORCO/VICODIN) 5-325 MG per tablet Take 1 tablet by mouth every 6 (six) hours as needed for pain. 11/15/12   Biagio Borg, MD  insulin aspart (NOVOLOG) 100 UNIT/ML injection Inject 0-9 Units into the skin 3 (three) times daily with meals.  05/18/13   Maryann Mikhail, DO  insulin glargine (LANTUS) 100 UNIT/ML injection Inject 0.6 mLs (60 Units total) into the skin at bedtime. 02/10/13 02/10/14  Biagio Borg, MD  isosorbide mononitrate (IMDUR) 60 MG 24 hr tablet Take 60 mg by mouth at bedtime.    Historical Provider, MD  labetalol (NORMODYNE) 200 MG tablet Take 200 mg by mouth 2 (two) times daily.    Historical Provider, MD  Lancets MISC Use as directed 1 per day 09/16/13   Biagio Borg, MD  potassium chloride (KLOR-CON 10) 10 MEQ tablet Take 1 tablet (10 mEq total) by mouth daily. For 5 days 09/16/13   Biagio Borg, MD  pravastatin (PRAVACHOL) 40 MG tablet Take 1 tablet (40 mg total) by mouth daily. 09/16/13   Biagio Borg, MD  risperiDONE (RISPERDAL) 0.25 MG tablet 1 tab by mouth in the AM, then 4 tabs by mouth in the PM 09/16/13   Biagio Borg, MD   BP 157/75  Pulse 72  Temp(Src) 98.4 F (36.9 C) (Oral)  Resp 15  Ht 5\' 5"  (1.651 m)  SpO2 97% Physical Exam  Nursing note and vitals reviewed. Constitutional: She appears well-developed and well-nourished. She appears distressed.  HENT:  Head: Normocephalic and atraumatic.  Eyes: Conjunctivae and EOM are normal. Pupils are equal, round, and reactive to light.  Neck: Normal range of motion.  Cardiovascular: Normal rate and regular rhythm.   No murmur heard. Pulmonary/Chest: Effort normal and breath sounds normal. No respiratory distress.  Abdominal: Soft. Bowel sounds are normal. There is no tenderness.  Musculoskeletal: Normal range of motion.  Neurological: She is alert. No cranial nerve deficit. She exhibits normal muscle tone. Coordination normal.  Weakness to the left arm appears to be due to pain at the elbow.  Skin: Skin is warm.    ED Course  Procedures (including critical care time) Labs Review Labs Reviewed  CBC WITH DIFFERENTIAL - Abnormal; Notable for the following:    WBC 12.5 (*)    Hemoglobin 11.7 (*)    RDW 16.4 (*)    Neutro Abs 9.4 (*)    Monocytes  Absolute 1.2 (*)    All other components within normal limits  COMPREHENSIVE METABOLIC PANEL - Abnormal; Notable for the following:    Sodium 149 (*)    Potassium 2.9 (*)    Glucose, Bld 48 (*)    Creatinine, Ser 1.15 (*)    Albumin 2.5 (*)    GFR calc non Af Amer 45 (*)    GFR calc Af Amer 52 (*)    All other components within normal limits  URINALYSIS, ROUTINE W REFLEX MICROSCOPIC - Abnormal; Notable for the following:    APPearance CLOUDY (*)    Hgb urine dipstick  TRACE (*)    Protein, ur >300 (*)    Nitrite POSITIVE (*)    Leukocytes, UA SMALL (*)    All other components within normal limits  PRO B NATRIURETIC PEPTIDE - Abnormal; Notable for the following:    Pro B Natriuretic peptide (BNP) 570.2 (*)    All other components within normal limits  URINE MICROSCOPIC-ADD ON - Abnormal; Notable for the following:    Squamous Epithelial / LPF MANY (*)    Bacteria, UA MANY (*)    All other components within normal limits  I-STAT CHEM 8, ED - Abnormal; Notable for the following:    Sodium 149 (*)    Potassium 2.7 (*)    Creatinine, Ser 1.30 (*)    Glucose, Bld 52 (*)    All other components within normal limits  CBG MONITORING, ED - Abnormal; Notable for the following:    Glucose-Capillary 49 (*)    All other components within normal limits  CBG MONITORING, ED - Abnormal; Notable for the following:    Glucose-Capillary 183 (*)    All other components within normal limits  CBG MONITORING, ED - Abnormal; Notable for the following:    Glucose-Capillary 124 (*)    All other components within normal limits  CBG MONITORING, ED - Abnormal; Notable for the following:    Glucose-Capillary 123 (*)    All other components within normal limits  URINE CULTURE  URINALYSIS, ROUTINE W REFLEX MICROSCOPIC  I-STAT CG4 LACTIC ACID, ED   Results for orders placed during the hospital encounter of 10/16/13  CBC WITH DIFFERENTIAL      Result Value Ref Range   WBC 12.5 (*) 4.0 - 10.5 K/uL   RBC  4.11  3.87 - 5.11 MIL/uL   Hemoglobin 11.7 (*) 12.0 - 15.0 g/dL   HCT 36.6  36.0 - 46.0 %   MCV 89.1  78.0 - 100.0 fL   MCH 28.5  26.0 - 34.0 pg   MCHC 32.0  30.0 - 36.0 g/dL   RDW 16.4 (*) 11.5 - 15.5 %   Platelets 340  150 - 400 K/uL   Neutrophils Relative % 76  43 - 77 %   Neutro Abs 9.4 (*) 1.7 - 7.7 K/uL   Lymphocytes Relative 13  12 - 46 %   Lymphs Abs 1.6  0.7 - 4.0 K/uL   Monocytes Relative 10  3 - 12 %   Monocytes Absolute 1.2 (*) 0.1 - 1.0 K/uL   Eosinophils Relative 1  0 - 5 %   Eosinophils Absolute 0.2  0.0 - 0.7 K/uL   Basophils Relative 0  0 - 1 %   Basophils Absolute 0.0  0.0 - 0.1 K/uL  COMPREHENSIVE METABOLIC PANEL      Result Value Ref Range   Sodium 149 (*) 137 - 147 mEq/L   Potassium 2.9 (*) 3.7 - 5.3 mEq/L   Chloride 108  96 - 112 mEq/L   CO2 29  19 - 32 mEq/L   Glucose, Bld 48 (*) 70 - 99 mg/dL   BUN 13  6 - 23 mg/dL   Creatinine, Ser 1.15 (*) 0.50 - 1.10 mg/dL   Calcium 9.4  8.4 - 10.5 mg/dL   Total Protein 6.6  6.0 - 8.3 g/dL   Albumin 2.5 (*) 3.5 - 5.2 g/dL   AST 9  0 - 37 U/L   ALT 8  0 - 35 U/L   Alkaline Phosphatase 81  39 - 117 U/L  Total Bilirubin 0.4  0.3 - 1.2 mg/dL   GFR calc non Af Amer 45 (*) >90 mL/min   GFR calc Af Amer 52 (*) >90 mL/min  URINALYSIS, ROUTINE W REFLEX MICROSCOPIC      Result Value Ref Range   Color, Urine YELLOW  YELLOW   APPearance CLOUDY (*) CLEAR   Specific Gravity, Urine 1.019  1.005 - 1.030   pH 5.0  5.0 - 8.0   Glucose, UA NEGATIVE  NEGATIVE mg/dL   Hgb urine dipstick TRACE (*) NEGATIVE   Bilirubin Urine NEGATIVE  NEGATIVE   Ketones, ur NEGATIVE  NEGATIVE mg/dL   Protein, ur >300 (*) NEGATIVE mg/dL   Urobilinogen, UA 0.2  0.0 - 1.0 mg/dL   Nitrite POSITIVE (*) NEGATIVE   Leukocytes, UA SMALL (*) NEGATIVE  PRO B NATRIURETIC PEPTIDE      Result Value Ref Range   Pro B Natriuretic peptide (BNP) 570.2 (*) 0 - 450 pg/mL  URINE MICROSCOPIC-ADD ON      Result Value Ref Range   Squamous Epithelial / LPF MANY  (*) RARE   WBC, UA 21-50  <3 WBC/hpf   Bacteria, UA MANY (*) RARE  I-STAT CG4 LACTIC ACID, ED      Result Value Ref Range   Lactic Acid, Venous 0.95  0.5 - 2.2 mmol/L  I-STAT CHEM 8, ED      Result Value Ref Range   Sodium 149 (*) 137 - 147 mEq/L   Potassium 2.7 (*) 3.7 - 5.3 mEq/L   Chloride 107  96 - 112 mEq/L   BUN 11  6 - 23 mg/dL   Creatinine, Ser 1.30 (*) 0.50 - 1.10 mg/dL   Glucose, Bld 52 (*) 70 - 99 mg/dL   Calcium, Ion 1.23  1.13 - 1.30 mmol/L   TCO2 27  0 - 100 mmol/L   Hemoglobin 12.6  12.0 - 15.0 g/dL   HCT 37.0  36.0 - 46.0 %   Comment NOTIFIED PHYSICIAN    CBG MONITORING, ED      Result Value Ref Range   Glucose-Capillary 49 (*) 70 - 99 mg/dL  CBG MONITORING, ED      Result Value Ref Range   Glucose-Capillary 183 (*) 70 - 99 mg/dL  CBG MONITORING, ED      Result Value Ref Range   Glucose-Capillary 124 (*) 70 - 99 mg/dL  CBG MONITORING, ED      Result Value Ref Range   Glucose-Capillary 123 (*) 70 - 99 mg/dL     Imaging Review Dg Elbow Complete Left  10/16/2013   CLINICAL DATA:  Pain  EXAM: LEFT ELBOW - COMPLETE 3+ VIEW  COMPARISON:  None.  FINDINGS: Frontal, lateral, and bilateral oblique views were obtained. There is no fracture, dislocation, or effusion. Joint spaces appear intact. No erosive change.  IMPRESSION: No fracture or effusion.  No appreciable arthropathic change.   Electronically Signed   By: Lowella Grip M.D.   On: 10/16/2013 13:42   Dg Forearm Left  10/16/2013   CLINICAL DATA:  Pain  EXAM: LEFT FOREARM - 2 VIEW  COMPARISON:  None.  FINDINGS: Frontal and lateral views were obtained. No fracture or dislocation. Joint spaces appear intact. No abnormal periosteal reaction.  IMPRESSION: No abnormality noted.   Electronically Signed   By: Lowella Grip M.D.   On: 10/16/2013 13:41   Ct Head Wo Contrast  10/16/2013   CLINICAL DATA:  Altered mental status  EXAM: CT HEAD WITHOUT CONTRAST  TECHNIQUE:  Contiguous axial images were obtained from the  base of the skull through the vertex without intravenous contrast.  COMPARISON:  May 15, 2013  FINDINGS: Moderate diffuse atrophy is stable. There is no mass, hemorrhage, extra-axial fluid collection, or midline shift. There is patchy small vessel disease throughout the centra semiovale bilaterally. Elsewhere gray-white compartments appear normal. There is no demonstrable acute infarct. Bony calvarium appears intact. Mastoid air cells are clear.  IMPRESSION: Atrophy with supratentorial small vessel disease, stable. No intracranial mass or hemorrhage seen. No acute infarct apparent.   Electronically Signed   By: Lowella Grip M.D.   On: 10/16/2013 09:41   Ct Chest Wo Contrast  10/16/2013   CLINICAL DATA:  Pulmonary nodule  EXAM: CT CHEST WITHOUT CONTRAST  TECHNIQUE: Multidetector CT imaging of the chest was performed following the standard protocol without IV contrast.  COMPARISON:  Chest radiograph Oct 16, 2013  FINDINGS: There is a nodular opacity in the posterior segment of the left upper lobe measuring 1.6 x 1.4 cm. There is a second nodular opacity just inferior to the larger nodular opacity in the posterior segment of the left upper lobe measuring 0.7 x 0.8 cm. There is a nodular lesion in the superior segment of the left lower lobe measuring 1.3 x 1.2 cm. There is a nodular opacity in the medial segment of the right middle lobe measuring 1.5 x 1.2 cm. There is a nodular lesion in the superior segment of the right lower lobe measuring 1.1 x 1.0 cm. There is a nodular lesion in the posterior segment of the right lower lobe measuring 1.4 by 0.8 cm. There is a nodular lesion in the medial right base measuring 2.0 x 1.3 cm. There is a nodular lesion in the posterior right base measuring 1.6 x 1.3 cm. There is bibasilar atelectasis.  Heart is enlarged. There is a small pericardial effusion. There is extensive coronary artery calcification.  There are a few subcentimeter mediastinal lymph nodes, by size  criteria there is no adenopathy.  In the visualized upper abdomen, there is a left adrenal mass which has attenuation values higher than is expected for adenoma measuring 3.3 x 3.4 cm. There is a non cystic mass arising from the upper pole the left kidney measuring 1.4 x 1.0 cm. Visualized upper abdominal structures otherwise appear unremarkable on this noncontrast enhanced study.  Thyroid appears normal. There is degenerative change in the thoracic spine. There are no blastic or lytic bone lesions. There is evidence of old rib 1 trauma on the left with healing.  IMPRESSION: Multiple noncalcified parenchymal lung nodular lesions suspicious for metastatic foci. There is also a left adrenal mass which statistically in is most likely metastatic in etiology. There is a small solid mass arising from the upper pole of the left kidney which could represent an early neoplasm. Note that it would be unusual for a mass of this small size arising from the kidney to result in the apparent metastatic lesions in the lungs and left adrenal.  There is underlying atelectatic change in the bases. Heart is enlarged with small pericardial effusion. There is extensive coronary artery calcification.  There is no demonstrable adenopathy by size criteria on this study.   Electronically Signed   By: Lowella Grip M.D.   On: 10/16/2013 09:49   Dg Chest Port 1 View  10/16/2013   CLINICAL DATA:  Weakness and hypertension  EXAM: PORTABLE CHEST - 1 VIEW  COMPARISON:  May 15, 2013 and January 02, 2012  FINDINGS: There is a 1.1 x 1.0 cm nodular opacity in the right mid lung. There is no edema or consolidation. Heart is enlarged with normal pulmonary vascularity. No adenopathy. There is atherosclerotic change in the aorta.  IMPRESSION: 1.1 x 1.0 cm nodular passed in the right mid lung. Advise correlation with noncontrast enhanced chest CT to further assess. No edema or consolidation. Stable cardiomegaly.   Electronically Signed   By:  Lowella Grip M.D.   On: 10/16/2013 08:54     EKG Interpretation None       Date: 10/16/2013  Rate: 77  Rhythm: normal sinus rhythm  QRS Axis: normal  Intervals: normal  ST/T Wave abnormalities: nonspecific ST/T changes  Conduction Disutrbances:none  Narrative Interpretation:   Old EKG Reviewed: none available  CRITICAL CARE Performed by: Mervin Kung Total critical care time: 30 Critical care time was exclusive of separately billable procedures and treating other patients. Critical care was necessary to treat or prevent imminent or life-threatening deterioration. Critical care was time spent personally by me on the following activities: development of treatment plan with patient and/or surrogate as well as nursing, discussions with consultants, evaluation of patient's response to treatment, examination of patient, obtaining history from patient or surrogate, ordering and performing treatments and interventions, ordering and review of laboratory studies, ordering and review of radiographic studies, pulse oximetry and re-evaluation of patient's condition.    MDM   Final diagnoses:  Confusion  Hypoglycemia  Hypokalemia  Hilar adenopathy  UTI (lower urinary tract infection)    Patient brought in for hypoglycemia. Patient was given some oral glucose by EMS. Upon arrival here patient blood sugar was in the 30s again. Patient given that amp of D50. Blood sugar came up inserted down again but she was given snacks in and held its own. Workup revealed a hypokalemia requiring 10 mg a potassium IV VBAC x2. Urinalysis suggestive of urinary tract infection. Antibiotic ordered but urine will be repeated along with a culture visit could be contamination but there is positive nitrite. Patient has a history of dementia family feels as if she has been worse lately. Patient complaining of left arm pain elbow pain x-rays that area were negative no evidence of trauma to the area. In  addition chest x-ray raises concerns for adenopathy. CT scan does raise concern for metastatic disease in the lungs in the lymph node area. Primary not known. Making hospitalist aware of this finding. Patient's head CT negative for any acute findings. Patient will be admitted by the hospitalist team.    Mervin Kung, MD 10/16/13 [phone]

## 2013-10-16 NOTE — H&P (Addendum)
PATIENT DETAILS Name: Katie Dawson Age: 80 y.o. Sex: female Date of Birth: 02/20/1934 Admit Date: 10/16/2013 [id number]:[id number] John, MD   CHIEF COMPLAINT:  Worsening weakness  HPI: Katie Dawson is a 80 y.o. female with a Past Medical History of  Dementia, diabetes, hypertension, gout,s/p right nephrectomy who presents today with the above noted complaint. Please note that the patient is confused and most of this history is obtained from the patient's daughter Davy Pique at bedside. Apparently over the past 2-3 days patient has gotten more confused than usual baseline, patient's daughter has noted that patient is having difficulty raising the left upper extremity because of pain. Yesterday patient had significant difficulty ambulating. Daughter also notes significant decrease in her ambulation, and claims that the patient is too weak to walk. Patient normally is able to get around the house with the help of a walker. This morning patient's daughter was unable to help patient up, as a result EMS was called, CBG at the field was 40, patient was given oral glucose, and then brought to the emergency room. In the ED, her sugar was again noted to be 49, she was given 1 amp of D50. She was also found to have foul-smelling urine that was consistent with a UTI, she was also found to be significantly hypokalemic.A CT scan of the head was negative for acute abnormalities,  chest x-ray was suspicious for a nodular mass in the right lung. Patient subsequently underwent a CT of the chest showed multiple nodular lesions in the lung suspicious for metastatic foci. Patient was also found to have a left adrenal mass, and a small solid mass arising from the upper pole of the left kidney. I was subsequently asked to admit this patient for further evaluation and treatment.   ALLERGIES:  No Known Allergies  PAST MEDICAL HISTORY: Past Medical History  Diagnosis Date  . DIABETES MELLITUS, TYPE II 04/22/2007  .  HYPERLIPIDEMIA 04/22/2007  . GOUT 04/22/2007  . ANEMIA-IRON DEFICIENCY 04/22/2007  . ANXIETY 04/22/2007  . OBSTRUCTIVE SLEEP APNEA 04/22/2007  . HYPERTENSION 04/22/2007  . GERD 04/22/2007  . RENAL INSUFFICIENCY 04/22/2007  . 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 FOOT PAIN, LEFT 12/23/2007  . OSTEOPENIA 02/12/2009  . INTERMITTENT VERTIGO 04/20/2009  . HYPERSOMNIA 01/11/2009  . COLONIC POLYPS, HX OF 04/22/2007  . NEPHRECTOMY, HX OF 04/22/2007  . Urinary tract infection     hx of  . Right fibular fracture July 2013    PAST SURGICAL HISTORY: Past Surgical History  Procedure Laterality Date  . S/p right nephrectomy    . S/p parathyroid surgury    . Orif wrist fracture  01/02/2012    Procedure: OPEN REDUCTION INTERNAL FIXATION (ORIF) WRIST FRACTURE;  Surgeon: Schuyler Amor, MD;  Location: [place];  Service: Orthopedics;  Laterality: Right;  Open Reduction Internal Fixation Right Distal Radius     MEDICATIONS AT HOME: Prior to Admission medications   Medication Sig Start Date End Date Taking? Authorizing Provider  amLODipine (NORVASC) 10 MG tablet Take 10 mg by mouth daily.   Yes Historical Provider, MD  busPIRone (BUSPAR) 7.5 MG tablet Take 7.5 mg by mouth 3 (three) times daily as needed (nerves).   Yes Historical Provider, MD  cloNIDine (CATAPRES) 0.1 MG tablet Take 0.1 mg by mouth 2 (two) times daily.   Yes Historical Provider, MD  donepezil (ARICEPT) 10 MG tablet Take 1 tablet (10 mg total) by mouth at bedtime. 09/16/13  Yes Jeneen Rinks  Quin Hoop, MD  Ferrous Sulfate (IRON) 325 (65 FE) MG TABS Take by mouth daily.     Yes Historical Provider, MD  gabapentin (NEURONTIN) 100 MG capsule Take 1 capsule (100 mg total) by mouth 3 (three) times daily. 05/18/13  Yes Maryann Mikhail, DO  glipiZIDE (GLUCOTROL XL) 10 MG 24 hr tablet Take 20 mg by mouth daily with breakfast.   Yes Historical Provider, MD  glucose blood (BAYER CONTOUR NEXT TEST) test strip Use as instructed 09/16/13  Yes Biagio Borg, MD  HYDROcodone-acetaminophen (NORCO/VICODIN) 5-325 MG per tablet Take 1 tablet by mouth every 6 (six) hours as needed for pain. 11/15/12  Yes Biagio Borg, MD  insulin glargine (LANTUS) 100 UNIT/ML injection Inject 0.6 mLs (60 Units total) into the skin at bedtime. 02/10/13 02/10/14 Yes Biagio Borg, MD  isosorbide mononitrate (IMDUR) 60 MG 24 hr tablet Take 60 mg by mouth at bedtime.   Yes Historical Provider, MD  labetalol (NORMODYNE) 200 MG tablet Take 200 mg by mouth 2 (two) times daily.   Yes Historical Provider, MD  Lancets MISC Use as directed 1 per day 09/16/13  Yes Biagio Borg, MD  pravastatin (PRAVACHOL) 40 MG tablet Take 1 tablet (40 mg total) by mouth daily. 09/16/13  Yes Biagio Borg, MD  risperiDONE (RISPERDAL) 0.25 MG tablet 1 tab by mouth in the AM, then 4 tabs by mouth in the PM 09/16/13  Yes Biagio Borg, MD  BD PEN NEEDLE NANO U/F 32G X 4 MM MISC USE WITH LANTUS DAILY.    Biagio Borg, MD    FAMILY HISTORY: Family History  Problem Relation Age of Onset  . Heart disease Mother     SOCIAL HISTORY:  reports that she has never smoked. She has never used smokeless tobacco. She reports that she does not drink alcohol or use illicit drugs.  REVIEW OF SYSTEMS:  Constitutional:   No  weight loss, night sweats,  Fevers  HEENT:    No headaches, Difficulty swallowing,Tooth/dental problems,Sore throat,  No sneezing, itching, ear ache, nasal congestion, post nasal drip,   Cardio-vascular: No chest pain,  Orthopnea, PND, swelling in lower extremities, anasarca,  dizziness, palpitations  GI:  No heartburn, indigestion, abdominal pain, nausea, vomiting, diarrhea, change in bowel habits, loss of appetite  Resp: No shortness of breath with exertion or at rest.  No excess mucus, no productive cough, No non-productive cough,  No coughing up of blood.No change in color of mucus.No wheezing.No chest wall deformity  Skin:  no rash or lesions.  GU:  no dysuria, change in color of  urine, no urgency or frequency.  No flank pain.  Musculoskeletal:  No decreased range of motion.  No back pain.  Psych: No change in mood or affec   PHYSICAL EXAM: Blood pressure 155/59, pulse 72, temperature 98.4 F (36.9 C), temperature source Oral, resp. rate 17, height 5\' 5"  (1.651 m), SpO2 96.00%.  General appearance :Awake, pleasantly confused. Speech Clear. Not toxic Looking HEENT: Atraumatic and Normocephalic, pupils equally reactive to light and accomodation Neck: supple, no JVD. No cervical lymphadenopathy.  Chest:Good air entry bilaterally, no added sounds  CVS: S1 S2 regular, no murmurs.  Abdomen: Bowel sounds present, Non tender and not distended with no gaurding, rigidity or rebound. Extremities: B/L Lower Ext shows no edema, both legs are warm to touch. Right great toe area is slightly tender-however no overt erythema or swelling. Some mild erythema just below the left elbow, area also  tender to palpation. Neurology: Awake, but confused. Not able to lift left upper extremity completely because of pain. No focal deficits otherwise. Skin:No Rash Wounds:N/A  LABS ON ADMISSION:   Recent Labs  10/16/13 0830 10/16/13 0855  NA 149* 149*  K 2.9* 2.7*  CL 108 107  CO2 29  --   GLUCOSE 48* 52*  BUN 13 11  CREATININE 1.15* 1.30*  CALCIUM 9.4  --     Recent Labs  10/16/13 0830  AST 9  ALT 8  ALKPHOS 81  BILITOT 0.4  PROT 6.6  ALBUMIN 2.5*   No results found for this basename: LIPASE, AMYLASE,  in the last 72 hours  Recent Labs  10/16/13 0830 10/16/13 0855  WBC 12.5*  --   NEUTROABS 9.4*  --   HGB 11.7* 12.6  HCT 36.6 37.0  MCV 89.1  --   PLT 340  --    No results found for this basename: CKTOTAL, CKMB, CKMBINDEX, TROPONINI,  in the last 72 hours No results found for this basename: DDIMER,  in the last 72 hours No components found with this basename: POCBNP,    RADIOLOGIC STUDIES ON ADMISSION: Dg Elbow Complete Left  10/16/2013   CLINICAL DATA:   Pain  EXAM: LEFT ELBOW - COMPLETE 3+ VIEW  COMPARISON:  None.  FINDINGS: Frontal, lateral, and bilateral oblique views were obtained. There is no fracture, dislocation, or effusion. Joint spaces appear intact. No erosive change.  IMPRESSION: No fracture or effusion.  No appreciable arthropathic change.   Electronically Signed   By: Lowella Grip M.D.   On: 10/16/2013 13:42   Dg Forearm Left  10/16/2013   CLINICAL DATA:  Pain  EXAM: LEFT FOREARM - 2 VIEW  COMPARISON:  None.  FINDINGS: Frontal and lateral views were obtained. No fracture or dislocation. Joint spaces appear intact. No abnormal periosteal reaction.  IMPRESSION: No abnormality noted.   Electronically Signed   By: Lowella Grip M.D.   On: 10/16/2013 13:41   Ct Head Wo Contrast  10/16/2013   CLINICAL DATA:  Altered mental status  EXAM: CT HEAD WITHOUT CONTRAST  TECHNIQUE: Contiguous axial images were obtained from the base of the skull through the vertex without intravenous contrast.  COMPARISON:  May 15, 2013  FINDINGS: Moderate diffuse atrophy is stable. There is no mass, hemorrhage, extra-axial fluid collection, or midline shift. There is patchy small vessel disease throughout the centra semiovale bilaterally. Elsewhere gray-white compartments appear normal. There is no demonstrable acute infarct. Bony calvarium appears intact. Mastoid air cells are clear.  IMPRESSION: Atrophy with supratentorial small vessel disease, stable. No intracranial mass or hemorrhage seen. No acute infarct apparent.   Electronically Signed   By: Lowella Grip M.D.   On: 10/16/2013 09:41   Ct Chest Wo Contrast  10/16/2013   CLINICAL DATA:  Pulmonary nodule  EXAM: CT CHEST WITHOUT CONTRAST  TECHNIQUE: Multidetector CT imaging of the chest was performed following the standard protocol without IV contrast.  COMPARISON:  Chest radiograph Oct 16, 2013  FINDINGS: There is a nodular opacity in the posterior segment of the left upper lobe measuring 1.6 x 1.4  cm. There is a second nodular opacity just inferior to the larger nodular opacity in the posterior segment of the left upper lobe measuring 0.7 x 0.8 cm. There is a nodular lesion in the superior segment of the left lower lobe measuring 1.3 x 1.2 cm. There is a nodular opacity in the medial segment of the right middle  lobe measuring 1.5 x 1.2 cm. There is a nodular lesion in the superior segment of the right lower lobe measuring 1.1 x 1.0 cm. There is a nodular lesion in the posterior segment of the right lower lobe measuring 1.4 by 0.8 cm. There is a nodular lesion in the medial right base measuring 2.0 x 1.3 cm. There is a nodular lesion in the posterior right base measuring 1.6 x 1.3 cm. There is bibasilar atelectasis.  Heart is enlarged. There is a small pericardial effusion. There is extensive coronary artery calcification.  There are a few subcentimeter mediastinal lymph nodes, by size criteria there is no adenopathy.  In the visualized upper abdomen, there is a left adrenal mass which has attenuation values higher than is expected for adenoma measuring 3.3 x 3.4 cm. There is a non cystic mass arising from the upper pole the left kidney measuring 1.4 x 1.0 cm. Visualized upper abdominal structures otherwise appear unremarkable on this noncontrast enhanced study.  Thyroid appears normal. There is degenerative change in the thoracic spine. There are no blastic or lytic bone lesions. There is evidence of old rib 1 trauma on the left with healing.  IMPRESSION: Multiple noncalcified parenchymal lung nodular lesions suspicious for metastatic foci. There is also a left adrenal mass which statistically in is most likely metastatic in etiology. There is a small solid mass arising from the upper pole of the left kidney which could represent an early neoplasm. Note that it would be unusual for a mass of this small size arising from the kidney to result in the apparent metastatic lesions in the lungs and left adrenal.   There is underlying atelectatic change in the bases. Heart is enlarged with small pericardial effusion. There is extensive coronary artery calcification.  There is no demonstrable adenopathy by size criteria on this study.   Electronically Signed   By: Lowella Grip M.D.   On: 10/16/2013 09:49   Dg Chest Port 1 View  10/16/2013   CLINICAL DATA:  Weakness and hypertension  EXAM: PORTABLE CHEST - 1 VIEW  COMPARISON:  May 15, 2013 and January 02, 2012  FINDINGS: There is a 1.1 x 1.0 cm nodular opacity in the right mid lung. There is no edema or consolidation. Heart is enlarged with normal pulmonary vascularity. No adenopathy. There is atherosclerotic change in the aorta.  IMPRESSION: 1.1 x 1.0 cm nodular passed in the right mid lung. Advise correlation with noncontrast enhanced chest CT to further assess. No edema or consolidation. Stable cardiomegaly.   Electronically Signed   By: Lowella Grip M.D.   On: 10/16/2013 08:54     ASSESSMENT AND PLAN: Present on Admission:  . Hypoglycemia - Patient very confused-claims that she is not on insulin, however patient's daughter confirms that patient is on insulin, she also appears to be on glipizide.Will check A1c, hold Lantus and glipizide-place on D5 normal saline, check CBGs every 2 hours for now. Follow clinical course.   Marland Kitchen UTI - Mild leukocytosis, otherwise afebrile and nontoxic looking. Start Rocephin, await urine cultures.  . Encephalopathy acute - Likely metabolic from UTI and perhaps hypoglycemia. - CT head negative, does have some mild left extremity weakness but  likely left elbow pain. Will check an MRI brain. - Follow clinical course, treat UTI, correct hypoglycemia.  .Hypokalemia -likely secondary to poor oral intake, replete and recheck in am  . Evolving Right Great toe pain/Left elbow area pain - Does have a history of gout-? evolving gout flare-has a  solitary kidney-therefore will avoid Colchicine and NSAID's, will give one  dose of Prednisone today, assess tomorrow-if better then,  can consider a short course of tapering prednisone.Check Uric acid  . Probable Metastatic Cancer - CT chest findings concerning for underlying metastatic cancer, primary unknown at this time. For now will try and treat the above issues, she likely will need a CT of the abdomen in the next few days. Has solitary kidney with stage II-III chronic kidney disease, will need to be very cautious before we give IV contrast. - Patient's daughter not aware if patient has had regular age appropriate cancer screening. Patient is confused to give a reliable history at this time.  Marland Kitchen ANEMIA-IRON DEFICIENCY - Hemoglobin stable, resume iron supplementation on discharge. Monitor hemoglobin periodically.  Marland Kitchen ANXIETY - Continue as needed BuSpar   . Dementia - Complicated by delirium/encephalopathy from metabolic abnormalities. CT head negative. Continue with Aricept and Risperdal.  . DIABETES MELLITUS, TYPE II - Check A1c, holding off on resuming Lantus and glipizide given hypoglycemia. Please see above.   Marland Kitchen HYPERTENSION - Continue amlodipine, clonidine, labetalol and nitrates. Follow BP trend and adjust medications accordingly   . Peripheral neuropathy - Continue with Neurontin.  Further plan will depend as patient's clinical course evolves and further radiologic and laboratory data become available. Patient will be monitored closely.  Above noted plan was discussed with patient/daughter Davy Pique) they were in agreement.   DVT Prophylaxis: Prophylactic Lovenox  Code Status: Full Code  Total time spent for admission equals 45 minutes.  [name] [facility] Pager [phone]  If 7PM-7AM, please contact night-coverage [email or web address] Password [facility] 10/16/2013, 3:32 PM  **Disclaimer: This note may have been dictated with voice recognition software. Similar sounding words can inadvertently be transcribed and this note may  contain transcription errors which may not have been corrected upon publication of note.**

## 2013-10-16 NOTE — ED Notes (Signed)
Zackowski, MD notified of abnormal lab test results 

## 2013-10-17 ENCOUNTER — Inpatient Hospital Stay (HOSPITAL_COMMUNITY): Payer: PRIVATE HEALTH INSURANCE

## 2013-10-17 DIAGNOSIS — N390 Urinary tract infection, site not specified: Secondary | ICD-10-CM | POA: Diagnosis present

## 2013-10-17 DIAGNOSIS — G9340 Encephalopathy, unspecified: Secondary | ICD-10-CM

## 2013-10-17 DIAGNOSIS — D509 Iron deficiency anemia, unspecified: Secondary | ICD-10-CM

## 2013-10-17 DIAGNOSIS — E119 Type 2 diabetes mellitus without complications: Secondary | ICD-10-CM

## 2013-10-17 LAB — GLUCOSE, CAPILLARY
GLUCOSE-CAPILLARY: 194 mg/dL — AB (ref 70–99)
GLUCOSE-CAPILLARY: 215 mg/dL — AB (ref 70–99)
GLUCOSE-CAPILLARY: 245 mg/dL — AB (ref 70–99)
Glucose-Capillary: 222 mg/dL — ABNORMAL HIGH (ref 70–99)
Glucose-Capillary: 246 mg/dL — ABNORMAL HIGH (ref 70–99)
Glucose-Capillary: 244 mg/dL — ABNORMAL HIGH (ref 70–99)
Glucose-Capillary: 214 mg/dL — ABNORMAL HIGH (ref 70–99)
Glucose-Capillary: 180 mg/dL — ABNORMAL HIGH (ref 70–99)

## 2013-10-17 LAB — BASIC METABOLIC PANEL
BUN: 12 mg/dL (ref 6–23)
CALCIUM: 8.9 mg/dL (ref 8.4–10.5)
CO2: 27 mEq/L (ref 19–32)
CREATININE: 0.97 mg/dL (ref 0.50–1.10)
Chloride: 107 mEq/L (ref 96–112)
GFR calc non Af Amer: 55 mL/min — ABNORMAL LOW (ref >90)
GFR, EST AFRICAN AMERICAN: 64 mL/min — AB (ref >90)
GLUCOSE: 251 mg/dL — AB (ref 70–99)
Potassium: 3 mEq/L — ABNORMAL LOW (ref 3.7–5.3)
Sodium: 146 mEq/L (ref 137–147)

## 2013-10-17 LAB — CBC
HCT: 35.3 % — ABNORMAL LOW (ref 36.0–46.0)
Hemoglobin: 11.3 g/dL — ABNORMAL LOW (ref 12.0–15.0)
MCH: 28.3 pg (ref 26.0–34.0)
MCHC: 32 g/dL (ref 30.0–36.0)
MCV: 88.5 fL (ref 78.0–100.0)
Platelets: 332 K/uL (ref 150–400)
RBC: 3.99 MIL/uL (ref 3.87–5.11)
RDW: 16.1 % — ABNORMAL HIGH (ref 11.5–15.5)
WBC: 13.5 K/uL — ABNORMAL HIGH (ref 4.0–10.5)

## 2013-10-17 LAB — HEMOGLOBIN A1C
Hgb A1c MFr Bld: 6.9 % — ABNORMAL HIGH (ref <5.7)
MEAN PLASMA GLUCOSE: 151 mg/dL — AB (ref <117)

## 2013-10-17 MED ORDER — POTASSIUM CHLORIDE CRYS ER 20 MEQ PO TBCR
40.0000 meq | EXTENDED_RELEASE_TABLET | Freq: Two times a day (BID) | ORAL | Status: AC
  Administered 2013-10-17 (×2): 40 meq via ORAL
  Filled 2013-10-17 (×2): qty 2

## 2013-10-17 MED ORDER — FUROSEMIDE 10 MG/ML IJ SOLN
40.0000 mg | Freq: Two times a day (BID) | INTRAMUSCULAR | Status: DC
  Administered 2013-10-17 – 2013-10-19 (×5): 40 mg via INTRAVENOUS
  Filled 2013-10-17 (×7): qty 4

## 2013-10-17 MED ORDER — HYDROCODONE-ACETAMINOPHEN 5-325 MG PO TABS
1.0000 | ORAL_TABLET | Freq: Four times a day (QID) | ORAL | Status: DC | PRN
  Administered 2013-10-18: 1 via ORAL
  Filled 2013-10-17: qty 1

## 2013-10-17 MED ORDER — INSULIN ASPART 100 UNIT/ML ~~LOC~~ SOLN
0.0000 [IU] | SUBCUTANEOUS | Status: DC
  Administered 2013-10-17: 3 [IU] via SUBCUTANEOUS
  Administered 2013-10-17: 2 [IU] via SUBCUTANEOUS
  Administered 2013-10-17: 3 [IU] via SUBCUTANEOUS
  Administered 2013-10-18: 7 [IU] via SUBCUTANEOUS
  Administered 2013-10-18: 3 [IU] via SUBCUTANEOUS
  Administered 2013-10-18: 1 [IU] via SUBCUTANEOUS
  Administered 2013-10-18: 3 [IU] via SUBCUTANEOUS
  Administered 2013-10-18 (×2): 2 [IU] via SUBCUTANEOUS

## 2013-10-17 MED ORDER — IOHEXOL 300 MG/ML  SOLN
25.0000 mL | INTRAMUSCULAR | Status: AC
  Administered 2013-10-17 (×2): 25 mL via ORAL

## 2013-10-17 MED ORDER — GLUCERNA SHAKE PO LIQD
237.0000 mL | ORAL | Status: DC
  Administered 2013-10-17 – 2013-10-19 (×3): 237 mL via ORAL
  Filled 2013-10-17 (×2): qty 237

## 2013-10-17 NOTE — Care Management Note (Addendum)
  Page 2 of 2   10/20/2013     1:00:06 PM CARE MANAGEMENT NOTE 10/20/2013  Patient:  Katie Dawson, Katie Dawson   Account Number:  [id number]  Date Initiated:  10/17/2013  Documentation initiated by:  Ilyse Tremain  Subjective/Objective Assessment:   Admitted with hypoglycemia     Action/Plan:   CM to follow for disposition needs   Anticipated DC Date:  10/17/2013   Anticipated DC Plan:  [place] referral  Clinical Social Worker      DC Planning Services  CM consult      Choice offered to / List presented to:             Status of service:  Completed, signed off Medicare Important Message given?  YES (If response is "NO", the following Medicare IM given date fields will be blank) Date Medicare IM given:  10/16/2013 Date Additional Medicare IM given:  10/19/2013  Discharge Disposition:  [place]  Per UR Regulation:  Reviewed for med. necessity/level of care/duration of stay  If discussed at [place] of Stay Meetings, dates discussed:    Comments:  Honore Wipperfurth RN, BSN, MSHL, CCM  Nurse - Case Manager, (Unit [place])  [phone]  10/19/2013 DTR states patient is not able to get up out of bed and is not able to take patient home. Dispositon Plan:  SNF / [facility] active.    Yazen Rosko RN, BSN, MSHL, CCM  Nurse - Case Manager, (Unit [phone]  10/19/2013 Hx/o Initial IM:  10/16/2013 / 8:52:30 AM Additional IM provided to patient 10/19/2013 - signed; copy to patient and chart. Social:  From home with DTRS; supportive granddtr/Brittany PT RECS:  SNF (SW Referral to [facility]) PCP:  Dr. Cathlean Cower Oncologist: yes  RCC with mets Dispositon Plan:  Pending    Erynne Kealey RN, BSN, MSHL, CCM  Nurse - Case Manager, (Unit [facility])  [phone]  10/17/2013 Hx/o Initial IM:  10/16/2013 / 8:52:30 AM

## 2013-10-17 NOTE — Progress Notes (Signed)
[facility] PROGRESS NOTE  Assessment/Plan: Encephalopathy acute due to  UTI (urinary tract infection): - Started on IV rocephin U.C. Pending. - Has remained afebrile, still leukocytosis. - Confusion has resolved.   Hypoglycemia: - Pending Hemoglobin A1c, Hold Lantus and glipizide. - Start SSI.  Hypokalemia: - Replete and recheck in am.  Evolving Right Great toe pain/Left elbow area pain: - Does have a history of gout.  Probable Metastatic Cancer: - CT chest findings concerning for underlying metastatic cancer, primary unknown at this time. - Has solitary kidney with stage II-III chronic kidney disease, will need to be very cautious before we give IV contrast.  - CT abd and pelvis with contrast.  ANEMIA-IRON DEFICIENCY: - Hemoglobin stable, resume iron supplementation.   ANXIETY  - Continue as needed BuSpar   Dementia  - Complicated by delirium/encephalopathy from metabolic abnormalities.  -CT head negative. Continue with Aricept and Risperdal.   DIABETES MELLITUS, TYPE II  - Check A1c. - BG significantly high resume SSI.  Code Status: full Family Communication: none  Disposition Plan: inpatient   Consultants:  none  Procedures: MRI 5.18.2015: No evidence of acute intracranial abnormality.  Advanced chronic small vessel ischemic disease    Antibiotics:  Rocephin  HPI/Subjective: Pt argumentative.  Objective: Filed Vitals:   10/16/13 1622 10/16/13 2100 10/17/13 0402 10/17/13 1013  BP: 166/70 184/67 184/77 165/69  Pulse: 80 92 95 74  Temp: 99.3 F (37.4 C) 98.1 F (36.7 C) 98.4 F (36.9 C) 98 F (36.7 C)  TempSrc: Oral Oral Oral Oral  Resp: 20 18 21 18   Height: 5\' 8"  (1.727 m)     Weight: 116.6 kg (257 lb 0.9 oz)  114.7 kg (252 lb 13.9 oz)   SpO2: 98% 96% 97% 97%    Intake/Output Summary (Last 24 hours) at 10/17/13 1204 Last data filed at 10/17/13 1111  Gross per 24 hour  Intake    250 ml  Output   1301 ml  Net  -1051 ml    Filed Weights   10/16/13 1622 10/17/13 0402  Weight: 116.6 kg (257 lb 0.9 oz) 114.7 kg (252 lb 13.9 oz)    Exam:  General: Alert, awake, oriented x3, in no acute distress.  HEENT: No bruits, no goiter.  Heart: Regular rate and rhythm, without murmurs, rubs, gallops.  Lungs: Good air movement, clear Abdomen: Soft, nontender, nondistended, positive bowel sounds.    Data Reviewed: Basic Metabolic Panel:  Recent Labs Lab 10/16/13 0830 10/16/13 0855 10/17/13 0500  NA 149* 149* 146  K 2.9* 2.7* 3.0*  CL 108 107 107  CO2 29  --  27  GLUCOSE 48* 52* 251*  BUN 13 11 12   CREATININE 1.15* 1.30* 0.97  CALCIUM 9.4  --  8.9   Liver Function Tests:  Recent Labs Lab 10/16/13 0830  AST 9  ALT 8  ALKPHOS 81  BILITOT 0.4  PROT 6.6  ALBUMIN 2.5*   No results found for this basename: LIPASE, AMYLASE,  in the last 168 hours No results found for this basename: AMMONIA,  in the last 168 hours CBC:  Recent Labs Lab 10/16/13 0830 10/16/13 0855 10/17/13 0500  WBC 12.5*  --  13.5*  NEUTROABS 9.4*  --   --   HGB 11.7* 12.6 11.3*  HCT 36.6 37.0 35.3*  MCV 89.1  --  88.5  PLT 340  --  332   Cardiac Enzymes: No results found for this basename: CKTOTAL, CKMB, CKMBINDEX, TROPONINI,  in the last 168  hours BNP (last 3 results)  Recent Labs  2013-11-12 0830  PROBNP 570.2*   CBG:  Recent Labs Lab 2013/11/12 2356 10/17/13 0206 10/17/13 0356 10/17/13 0627 10/17/13 1122  GLUCAP 215* 222* 246* 244* 194*    No results found for this or any previous visit (from the past 240 hour(s)).   Studies: Dg Elbow Complete Left  11-12-2013   CLINICAL DATA:  Pain  EXAM: LEFT ELBOW - COMPLETE 3+ VIEW  COMPARISON:  None.  FINDINGS: Frontal, lateral, and bilateral oblique views were obtained. There is no fracture, dislocation, or effusion. Joint spaces appear intact. No erosive change.  IMPRESSION: No fracture or effusion.  No appreciable arthropathic change.   Electronically Signed   By:  Lowella Grip M.D.   On: 12-Nov-2013 13:42   Dg Forearm Left  November 12, 2013   CLINICAL DATA:  Pain  EXAM: LEFT FOREARM - 2 VIEW  COMPARISON:  None.  FINDINGS: Frontal and lateral views were obtained. No fracture or dislocation. Joint spaces appear intact. No abnormal periosteal reaction.  IMPRESSION: No abnormality noted.   Electronically Signed   By: Lowella Grip M.D.   On: 12-Nov-2013 13:41   Ct Head Wo Contrast  11-12-13   CLINICAL DATA:  Altered mental status  EXAM: CT HEAD WITHOUT CONTRAST  TECHNIQUE: Contiguous axial images were obtained from the base of the skull through the vertex without intravenous contrast.  COMPARISON:  May 15, 2013  FINDINGS: Moderate diffuse atrophy is stable. There is no mass, hemorrhage, extra-axial fluid collection, or midline shift. There is patchy small vessel disease throughout the centra semiovale bilaterally. Elsewhere gray-white compartments appear normal. There is no demonstrable acute infarct. Bony calvarium appears intact. Mastoid air cells are clear.  IMPRESSION: Atrophy with supratentorial small vessel disease, stable. No intracranial mass or hemorrhage seen. No acute infarct apparent.   Electronically Signed   By: Lowella Grip M.D.   On: 11/12/13 09:41   Ct Chest Wo Contrast  11/12/13   CLINICAL DATA:  Pulmonary nodule  EXAM: CT CHEST WITHOUT CONTRAST  TECHNIQUE: Multidetector CT imaging of the chest was performed following the standard protocol without IV contrast.  COMPARISON:  Chest radiograph 2013-11-12  FINDINGS: There is a nodular opacity in the posterior segment of the left upper lobe measuring 1.6 x 1.4 cm. There is a second nodular opacity just inferior to the larger nodular opacity in the posterior segment of the left upper lobe measuring 0.7 x 0.8 cm. There is a nodular lesion in the superior segment of the left lower lobe measuring 1.3 x 1.2 cm. There is a nodular opacity in the medial segment of the right middle lobe measuring  1.5 x 1.2 cm. There is a nodular lesion in the superior segment of the right lower lobe measuring 1.1 x 1.0 cm. There is a nodular lesion in the posterior segment of the right lower lobe measuring 1.4 by 0.8 cm. There is a nodular lesion in the medial right base measuring 2.0 x 1.3 cm. There is a nodular lesion in the posterior right base measuring 1.6 x 1.3 cm. There is bibasilar atelectasis.  Heart is enlarged. There is a small pericardial effusion. There is extensive coronary artery calcification.  There are a few subcentimeter mediastinal lymph nodes, by size criteria there is no adenopathy.  In the visualized upper abdomen, there is a left adrenal mass which has attenuation values higher than is expected for adenoma measuring 3.3 x 3.4 cm. There is a non cystic mass  arising from the upper pole the left kidney measuring 1.4 x 1.0 cm. Visualized upper abdominal structures otherwise appear unremarkable on this noncontrast enhanced study.  Thyroid appears normal. There is degenerative change in the thoracic spine. There are no blastic or lytic bone lesions. There is evidence of old rib 1 trauma on the left with healing.  IMPRESSION: Multiple noncalcified parenchymal lung nodular lesions suspicious for metastatic foci. There is also a left adrenal mass which statistically in is most likely metastatic in etiology. There is a small solid mass arising from the upper pole of the left kidney which could represent an early neoplasm. Note that it would be unusual for a mass of this small size arising from the kidney to result in the apparent metastatic lesions in the lungs and left adrenal.  There is underlying atelectatic change in the bases. Heart is enlarged with small pericardial effusion. There is extensive coronary artery calcification.  There is no demonstrable adenopathy by size criteria on this study.   Electronically Signed   By: Lowella Grip M.D.   On: 10/16/2013 09:49   Mr Brain Wo Contrast  10/17/2013    CLINICAL DATA:  Altered mental status and worsening weakness.  EXAM: MRI HEAD WITHOUT CONTRAST  TECHNIQUE: Multiplanar, multiecho pulse sequences of the brain and surrounding structures were obtained without intravenous contrast.  COMPARISON:  Head CT 10/16/2013  FINDINGS: Coronal T2 weighted images were inadvertently not obtained.  There is no acute infarct. Small foci of remote microhemorrhage are present the inferior right cerebellum and right temporal lobe. Confluent T2 hyperintensities are present in the subcortical and deep cerebral white matter with patchy T2 hyperintensity noted in the pons, nonspecific but compatible with advanced chronic small vessel ischemic disease. There is mild generalized cerebral atrophy. There is no evidence of mass, midline shift, or extra-axial fluid collection.  Orbits are unremarkable paranasal sinuses and mastoid air cells are clear. Major intracranial vascular flow voids are unremarkable.  IMPRESSION: 1. No evidence of acute intracranial abnormality. 2. Advanced chronic small vessel ischemic disease.   Electronically Signed   By: Logan Bores   On: 10/17/2013 08:47   Dg Chest Port 1 View  10/16/2013   CLINICAL DATA:  Weakness and hypertension  EXAM: PORTABLE CHEST - 1 VIEW  COMPARISON:  May 15, 2013 and January 02, 2012  FINDINGS: There is a 1.1 x 1.0 cm nodular opacity in the right mid lung. There is no edema or consolidation. Heart is enlarged with normal pulmonary vascularity. No adenopathy. There is atherosclerotic change in the aorta.  IMPRESSION: 1.1 x 1.0 cm nodular passed in the right mid lung. Advise correlation with noncontrast enhanced chest CT to further assess. No edema or consolidation. Stable cardiomegaly.   Electronically Signed   By: Lowella Grip M.D.   On: 10/16/2013 08:54    Scheduled Meds: . amLODipine  10 mg Oral Daily  . cefTRIAXone (ROCEPHIN)  IV  1 g Intravenous Q24H  . cloNIDine  0.1 mg Oral BID  . donepezil  10 mg Oral QHS  .  enoxaparin (LOVENOX) injection  40 mg Subcutaneous Q24H  . furosemide  40 mg Intravenous Q12H  . gabapentin  100 mg Oral TID  . insulin aspart  0-9 Units Subcutaneous 6 times per day  . isosorbide mononitrate  60 mg Oral QHS  . labetalol  200 mg Oral BID  . potassium chloride  40 mEq Oral BID  . risperiDONE  0.25 mg Oral Daily  . risperiDONE  0.5  mg Oral QHS  . simvastatin  20 mg Oral q1800  . sodium chloride  3 mL Intravenous Q12H   Continuous Infusions:    [facility] Pager [phone]. If 8PM-8AM, please contact night-coverage at [email or web address], password [facility] 10/17/2013, 12:04 PM  LOS: 1 day

## 2013-10-17 NOTE — Progress Notes (Signed)
Patient alert to self only. Attempting to get out of bed. Bed alarm activated. Incontinent of urine. Room camera activated.

## 2013-10-17 NOTE — Progress Notes (Signed)
UR completed Angelo Prindle K. Talton Delpriore, RN, BSN, [place], CCM  10/17/2013 12:17 PM

## 2013-10-17 NOTE — Progress Notes (Signed)
INITIAL NUTRITION ASSESSMENT  DOCUMENTATION CODES Per approved criteria  -Obesity Unspecified   INTERVENTION: Provide Glucerna Shake once daily Encourage healthful PO intake  NUTRITION DIAGNOSIS: Inadequate oral intake related to poor appetite as evidenced by pt's report of 8 lb weight loss in the past month.  Goal: Pt to meet >/= 90% of their estimated nutrition needs  Monitor:  PO intake, weight trend, labs  Reason for Assessment: Malnutrition Screening Tool, score of 2  80 y.o. female  Admitting Dx: Encephalopathy acute  ASSESSMENT: 80 y.o. female with a Past Medical History of Dementia, diabetes, hypertension, gout,s/p right nephrectomy who presents with complaints of worsening weakness. Apparently over the past 2-3 days patient has gotten more confused than usual baseline, patient's daughter has noted that patient is having difficulty raising the left upper extremity because of pain. Yesterday patient had significant difficulty ambulating. In the ED, her sugar was again noted to be 49, she was given 1 amp of D50. She was also found to have foul-smelling urine that was consistent with a UTI, she was also found to be significantly hypokalemic.A CT scan of the head was negative for acute abnormalities, chest x-ray was suspicious for a nodular mass in the right lung. Patient subsequently underwent a CT of the chest showed multiple nodular lesions in the lung suspicious for metastatic foci. Patient was also found to have a left adrenal mass, and a small solid mass arising from the upper pole of the left kidney.  Pt states she has had a poor appetite for the past week and has not been eating much. Today she describes her appetite as fair and she only ate about 25% of breakfast. Pt states she has lost 10-15 lbs in the past month but, is unsure how much she usually weights. She states she used to weigh 180 lbs but, has gained weight over the past year. Weight history shows 8 lbs weight loss  in the past month (3% weight loss- not significant).  Per nursing notes, pt ate 100% of breakfast this morning.   Nutrition Focused Physical Exam:  Subcutaneous Fat:  Orbital Region: wnl Upper Arm Region: wnl Thoracic and Lumbar Region: NA  Muscle:  Temple Region: mild wasting Clavicle Bone Region: wnl Clavicle and Acromion Bone Region: wnl Scapular Bone Region: wnl Dorsal Hand: wnl Patellar Region: wnl Anterior Thigh Region: wnl Posterior Calf Region: wnl  Edema: non-pitting RLE and LLE edema   Height: Ht Readings from Last 1 Encounters:  10/16/13 5\' 8"  (1.727 m)    Weight: Wt Readings from Last 1 Encounters:  10/17/13 252 lb 13.9 oz (114.7 kg)    Ideal Body Weight: 140 lbs  % Ideal Body Weight: 180%  Wt Readings from Last 10 Encounters:  10/17/13 252 lb 13.9 oz (114.7 kg)  09/16/13 260 lb (117.935 kg)  05/16/13 263 lb 11.2 oz (119.614 kg)  02/10/13 260 lb 8 oz (118.162 kg)  04/08/12 255 lb 8 oz (115.894 kg)  02/11/12 260 lb (117.935 kg)  01/02/12 295 lb (133.811 kg)  01/02/12 295 lb (133.811 kg)  12/27/11 276 lb 7.3 oz (125.4 kg)  11/05/10 295 lb 4 oz (133.925 kg)    Usual Body Weight: 260 lbs  % Usual Body Weight: 97%  BMI:  Body mass index is 38.46 kg/(m^2). (obesity)  Estimated Nutritional Needs: Kcal: 2000-2200 Protein: 90-100 grams Fluid: 2-2.2 L/day  Skin: non-pitting RLE and LLE edema  Diet Order:   Heart Healthy/ Carb Modified  EDUCATION NEEDS: -No education needs identified at  this time   Intake/Output Summary (Last 24 hours) at 10/17/13 1311 Last data filed at 10/17/13 1111  Gross per 24 hour  Intake    250 ml  Output   1301 ml  Net  -1051 ml    Last BM: 5/16  Labs:   Recent Labs Lab 10/16/13 0830 10/16/13 0855 10/17/13 0500  NA 149* 149* 146  K 2.9* 2.7* 3.0*  CL 108 107 107  CO2 29  --  27  BUN 13 11 12   CREATININE 1.15* 1.30* 0.97  CALCIUM 9.4  --  8.9  GLUCOSE 48* 52* 251*    CBG (last 3)   Recent  Labs  10/17/13 0356 10/17/13 0627 10/17/13 1122  GLUCAP 246* 244* 194*    Scheduled Meds: . amLODipine  10 mg Oral Daily  . cefTRIAXone (ROCEPHIN)  IV  1 g Intravenous Q24H  . cloNIDine  0.1 mg Oral BID  . donepezil  10 mg Oral QHS  . enoxaparin (LOVENOX) injection  40 mg Subcutaneous Q24H  . furosemide  40 mg Intravenous Q12H  . gabapentin  100 mg Oral TID  . insulin aspart  0-9 Units Subcutaneous 6 times per day  . iohexol  25 mL Oral Q1 Hr x 2  . isosorbide mononitrate  60 mg Oral QHS  . labetalol  200 mg Oral BID  . potassium chloride  40 mEq Oral BID  . risperiDONE  0.25 mg Oral Daily  . risperiDONE  0.5 mg Oral QHS  . simvastatin  20 mg Oral q1800  . sodium chloride  3 mL Intravenous Q12H    Continuous Infusions:   Past Medical History  Diagnosis Date  . DIABETES MELLITUS, TYPE II 04/22/2007  . HYPERLIPIDEMIA 04/22/2007  . GOUT 04/22/2007  . ANEMIA-IRON DEFICIENCY 04/22/2007  . ANXIETY 04/22/2007  . OBSTRUCTIVE SLEEP APNEA 04/22/2007  . HYPERTENSION 04/22/2007  . GERD 04/22/2007  . RENAL INSUFFICIENCY 04/22/2007  . 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 FOOT PAIN, LEFT 12/23/2007  . OSTEOPENIA 02/12/2009  . INTERMITTENT VERTIGO 04/20/2009  . HYPERSOMNIA 01/11/2009  . COLONIC POLYPS, HX OF 04/22/2007  . NEPHRECTOMY, HX OF 04/22/2007  . Urinary tract infection     hx of  . Right fibular fracture July 2013    Past Surgical History  Procedure Laterality Date  . S/p right nephrectomy    . S/p parathyroid surgury    . Orif wrist fracture  01/02/2012    Procedure: OPEN REDUCTION INTERNAL FIXATION (ORIF) WRIST FRACTURE;  Surgeon: Schuyler Amor, MD;  Location: [place];  Service: Orthopedics;  Laterality: Right;  Open Reduction Internal Fixation Right Distal Radius     Pryor Ochoa RD, LDN Inpatient Clinical Dietitian Pager: [phone] After Hours Pager: [phone]

## 2013-10-17 NOTE — Progress Notes (Signed)
PT Cancellation Note  Patient Details Name: Katie Dawson MRN: [id number] DOB: Oct 10, 1933   Cancelled Treatment:    Reason Eval/Treat Not Completed: Patient at procedure or test/unavailable   Rexanne Mano 10/17/2013, 2:03 PM Pager [phone]

## 2013-10-18 DIAGNOSIS — F29 Unspecified psychosis not due to a substance or known physiological condition: Secondary | ICD-10-CM

## 2013-10-18 DIAGNOSIS — R5381 Other malaise: Secondary | ICD-10-CM

## 2013-10-18 DIAGNOSIS — R5383 Other fatigue: Secondary | ICD-10-CM

## 2013-10-18 DIAGNOSIS — D649 Anemia, unspecified: Secondary | ICD-10-CM

## 2013-10-18 DIAGNOSIS — R918 Other nonspecific abnormal finding of lung field: Secondary | ICD-10-CM

## 2013-10-18 DIAGNOSIS — N289 Disorder of kidney and ureter, unspecified: Secondary | ICD-10-CM

## 2013-10-18 DIAGNOSIS — R4182 Altered mental status, unspecified: Secondary | ICD-10-CM

## 2013-10-18 DIAGNOSIS — F4489 Other dissociative and conversion disorders: Secondary | ICD-10-CM

## 2013-10-18 LAB — URINE CULTURE: Colony Count: >=100000

## 2013-10-18 LAB — GLUCOSE, CAPILLARY
GLUCOSE-CAPILLARY: 125 mg/dL — AB (ref 70–99)
GLUCOSE-CAPILLARY: 213 mg/dL — AB (ref 70–99)
GLUCOSE-CAPILLARY: 202 mg/dL — AB (ref 70–99)
GLUCOSE-CAPILLARY: 239 mg/dL — AB (ref 70–99)
Glucose-Capillary: 171 mg/dL — ABNORMAL HIGH (ref 70–99)
Glucose-Capillary: 184 mg/dL — ABNORMAL HIGH (ref 70–99)
Glucose-Capillary: 336 mg/dL — ABNORMAL HIGH (ref 70–99)

## 2013-10-18 MED ORDER — CIPROFLOXACIN HCL 500 MG PO TABS
500.0000 mg | ORAL_TABLET | Freq: Two times a day (BID) | ORAL | Status: DC
  Filled 2013-10-18 (×2): qty 1

## 2013-10-18 MED ORDER — INSULIN ASPART 100 UNIT/ML ~~LOC~~ SOLN
0.0000 [IU] | Freq: Three times a day (TID) | SUBCUTANEOUS | Status: DC
Start: 2013-10-18 — End: 2013-10-20
  Administered 2013-10-19: 7 [IU] via SUBCUTANEOUS
  Administered 2013-10-19: 3 [IU] via SUBCUTANEOUS
  Administered 2013-10-19: 5 [IU] via SUBCUTANEOUS
  Administered 2013-10-19: 2 [IU] via SUBCUTANEOUS
  Administered 2013-10-20 (×2): 3 [IU] via SUBCUTANEOUS

## 2013-10-18 MED ORDER — HYDRALAZINE HCL 20 MG/ML IJ SOLN: 10.0000 mg | Freq: Once | INTRAMUSCULAR | Status: DC

## 2013-10-18 MED ORDER — CEPHALEXIN 500 MG PO CAPS
500.0000 mg | ORAL_CAPSULE | Freq: Two times a day (BID) | ORAL | Status: DC
  Administered 2013-10-18 – 2013-10-20 (×5): 500 mg via ORAL
  Filled 2013-10-18 (×7): qty 1

## 2013-10-18 MED ORDER — HYDRALAZINE HCL 20 MG/ML IJ SOLN: 5.0000 mg | Freq: Once | INTRAMUSCULAR | Status: DC

## 2013-10-18 MED ORDER — HALOPERIDOL LACTATE 5 MG/ML IJ SOLN: 1.0000 mg | Freq: Four times a day (QID) | INTRAMUSCULAR | Status: DC | PRN

## 2013-10-18 NOTE — Progress Notes (Signed)
Patient's blood pressure elevated 205/61. Notified Dr. Hilbert Bible. Orders given to administer morning blood pressure medications and if blood pressure has not come down after medications are given, make sure to have oncoming nurse have attending doctor address blood pressure when making rounds.

## 2013-10-18 NOTE — Consult Note (Signed)
  Asked to review for possible L adrenal biopsy. Prior MR 04/09/2001 characterized 3cm L adrenal lesion as adenoma, so low yield for biopsy.  Would recommend eval breast, colon, etc for possible primary site of presumed lung mets. If no obvious primary source recommend PET CT before proceeding with lung biopsy to assess for any safer biopsy target. Thanks Lucrezia Europe MD [phone]

## 2013-10-18 NOTE — Plan of Care (Signed)
Problem: Phase I Progression Outcomes Goal: Voiding-avoid urinary catheter unless indicated Outcome: Completed/Met Date Met:  10/18/13 Incontinent several times during the night

## 2013-10-18 NOTE — Consult Note (Signed)
New Hematology/Oncology Consult   Referral MD: Dr. Aileen Fass   Reason for Referral: Lung nodules concerning for metastases    HPI: She was admitted 10/16/2013 with confusion, generalized weakness, hypoglycemia, and a urinary tract infection. A chest x-ray was suspicious for a mass in the right lung and a CT confirmed multiple nodular lung lesions suspicious for metastases. A mass was also noted in the left kidney. A left adrenal mass was present on an MRI in 2002.  She had a right nephrectomy in the remote past for treatment of a "cancer ". We do not have records regarding the nephrectomy available in the current electronic chart.  She is alert, but remains confused and is unable to give a detailed history. No specific complaint aside from pain with movement at the left arm.    Past Medical History  Diagnosis Date  . DIABETES MELLITUS, TYPE II 04/22/2007  . HYPERLIPIDEMIA 04/22/2007  . GOUT 04/22/2007  . ANEMIA-IRON DEFICIENCY 04/22/2007  . ANXIETY 04/22/2007  . OBSTRUCTIVE SLEEP APNEA 04/22/2007  . HYPERTENSION 04/22/2007  . GERD 04/22/2007  . RENAL INSUFFICIENCY 04/22/2007  . 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 FOOT PAIN, LEFT 12/23/2007  . OSTEOPENIA 02/12/2009  . INTERMITTENT VERTIGO 04/20/2009  . HYPERSOMNIA 01/11/2009  . COLONIC POLYPS, HX OF 04/22/2007  . NEPHRECTOMY, HX OF 04/22/2007  . Urinary tract infection     hx of  . Right fibular fracture July 2013  :  Past Surgical History  Procedure Laterality Date  . S/p right nephrectomy    . S/p parathyroid surgury    . Orif wrist fracture  01/02/2012    Procedure: OPEN REDUCTION INTERNAL FIXATION (ORIF) WRIST FRACTURE;  Surgeon: Schuyler Amor, MD;  Location: [facility];  Service: Orthopedics;  Laterality: Right;  Open Reduction Internal Fixation Right Distal Radius   :  Current facility-administered medications:acetaminophen (TYLENOL) suppository 650 mg, 650 mg, Rectal, Q6H PRN, [name], MD;   acetaminophen (TYLENOL) tablet 650 mg, 650 mg, Oral, Q6H PRN, [name], MD;  albuterol (PROVENTIL) (2.5 MG/3ML) 0.083% nebulizer solution 2.5 mg, 2.5 mg, Nebulization, Q2H PRN, [name], MD alum & mag hydroxide-simeth (MAALOX/MYLANTA) 200-200-20 MG/5ML suspension 30 mL, 30 mL, Oral, Q6H PRN, [name], MD;  amLODipine (NORVASC) tablet 10 mg, 10 mg, Oral, Daily, [name], MD, 10 mg at 10/18/13 0644;  busPIRone (BUSPAR) tablet 7.5 mg, 7.5 mg, Oral, TID PRN, [name], MD;  cephALEXin (KEFLEX) capsule 500 mg, 500 mg, Oral, Q12H, Charlynne Cousins, MD, 500 mg at 10/18/13 1523 cloNIDine (CATAPRES) tablet 0.1 mg, 0.1 mg, Oral, BID, [name], MD, 0.1 mg at 10/18/13 0641;  donepezil (ARICEPT) tablet 10 mg, 10 mg, Oral, QHS, [name], MD, 10 mg at 10/17/13 2321;  enoxaparin (LOVENOX) injection 40 mg, 40 mg, Subcutaneous, Q24H, [name], MD, 40 mg at 10/17/13 2321;  feeding supplement (GLUCERNA SHAKE) (GLUCERNA SHAKE) liquid 237 mL, 237 mL, Oral, Q24H, Reanne J Barnett, RD, 237 mL at 10/18/13 1352 furosemide (LASIX) injection 40 mg, 40 mg, Intravenous, Q12H, Charlynne Cousins, MD, 40 mg at 10/18/13 0815;  gabapentin (NEURONTIN) capsule 100 mg, 100 mg, Oral, TID, [name], MD, 100 mg at 10/18/13 1523;  guaiFENesin-dextromethorphan (ROBITUSSIN DM) 100-10 MG/5ML syrup 5 mL, 5 mL, Oral, Q4H PRN, [name], MD;  haloperidol lactate (HALDOL) injection 1 mg, 1 mg, Intravenous, Q6H PRN, Charlynne Cousins, MD HYDROcodone-acetaminophen (NORCO/VICODIN) 5-325 MG per tablet 1 tablet, 1 tablet, Oral,  Q6H PRN, Charlynne Cousins, MD, 1 tablet at 10/18/13 0439;  insulin aspart (novoLOG) injection 0-9 Units, 0-9 Units, Subcutaneous, 6 times per day, Charlynne Cousins, MD, 3 Units at 10/18/13 1226;  isosorbide mononitrate (IMDUR) 24 hr tablet 60 mg, 60 mg, Oral, QHS, [name], MD, 60 mg at 10/17/13 2321 labetalol (NORMODYNE) tablet 200  mg, 200 mg, Oral, BID, [name], MD, 200 mg at 10/18/13 0641;  morphine 2 MG/ML injection 1 mg, 1 mg, Intravenous, Q4H PRN, [name], MD;  ondansetron (ZOFRAN) injection 4 mg, 4 mg, Intravenous, Q6H PRN, [name], MD;  ondansetron (ZOFRAN) tablet 4 mg, 4 mg, Oral, Q6H PRN, [name], MD oxyCODONE (Oxy IR/ROXICODONE) immediate release tablet 5 mg, 5 mg, Oral, Q4H PRN, [name], MD;  risperiDONE (RISPERDAL) tablet 0.25 mg, 0.25 mg, Oral, Daily, [name], MD, 0.25 mg at 10/18/13 1035;  risperiDONE (RISPERDAL) tablet 0.5 mg, 0.5 mg, Oral, QHS, [name], MD, 0.5 mg at 10/17/13 2322;  simvastatin (ZOCOR) tablet 20 mg, 20 mg, Oral, q1800, [name], MD, 20 mg at 10/17/13 1713 sodium chloride 0.9 % injection 3 mL, 3 mL, Intravenous, Q12H, [name], MD, 3 mL at 10/18/13 1035:  . amLODipine  10 mg Oral Daily  . cephALEXin  500 mg Oral Q12H  . cloNIDine  0.1 mg Oral BID  . donepezil  10 mg Oral QHS  . enoxaparin (LOVENOX) injection  40 mg Subcutaneous Q24H  . feeding supplement (GLUCERNA SHAKE)  237 mL Oral Q24H  . furosemide  40 mg Intravenous Q12H  . gabapentin  100 mg Oral TID  . insulin aspart  0-9 Units Subcutaneous 6 times per day  . isosorbide mononitrate  60 mg Oral QHS  . labetalol  200 mg Oral BID  . risperiDONE  0.25 mg Oral Daily  . risperiDONE  0.5 mg Oral QHS  . simvastatin  20 mg Oral q1800  . sodium chloride  3 mL Intravenous Q12H  :  No Known Allergies:  Family History  Problem Relation Age of Onset  . Heart disease Mother   :    Social history: She lives with family in [place]. She does not use tobacco or alcohol.   Review of Systems:  Positives include: Pain with movement of the left lower arm  A complete ROS was otherwise negative.   Physical Exam:  Blood pressure 178/72, pulse 78, temperature 98.4 F (36.9 C), temperature source Oral, resp. rate 20, height 5\' 8"  (1.727 m), weight 264  lb 15.9 oz (120.2 kg), SpO2 94.00%.  HEENT: Oral cavity without visible mass, no thrush, neck without mass Lungs: Inspiratory rhonchi at the lower posterior chest bilaterally, no respiratory distress Cardiac: Regular rate and rhythm Abdomen: No hepatosplenomegaly, nontender, no mass Breasts: Bilateral breast without mass  Vascular: No leg edema Lymph nodes: No cervical, supra-clavicular, axillary, or inguinal nodes Neurologic: Alert, oriented to place and month, follows commands, moves all extremities Musculoskeletal: No spine tenderness LABS:   Recent Labs  10/16/13 0830 10/16/13 0855 10/17/13 0500  WBC 12.5*  --  13.5*  HGB 11.7* 12.6 11.3*  HCT 36.6 37.0 35.3*  PLT 340  --  332     Recent Labs  10/16/13 0830 10/16/13 0855 10/17/13 0500  NA 149* 149* 146  K 2.9* 2.7* 3.0*  CL 108 107 107  CO2 29  --  27  GLUCOSE 48* 52* 251*  BUN 13 11 12   CREATININE 1.15*  1.30* 0.97  CALCIUM 9.4  --  8.9      RADIOLOGY:  Ct Abdomen Pelvis Wo Contrast  10/18/2013   CLINICAL DATA:  unknow primary cancer  EXAM:  CT ABDOMEN AND PELVIS WITHOUT CONTRAST  TECHNIQUE: Multidetector CT imaging of the abdomen and pelvis was performed following the standard protocol without IV contrast.  COMPARISON:  US RENAL dated 05/16/2013; CT CHEST W/O CM dated 10/16/2013  FINDINGS: None calcified pulmonary nodules within the right lung base. Trace bilateral effusions are appreciated. There is scarring versus atelectasis within the lung bases.  Noncontrast evaluation of the liver, spleen, right adrenal is unremarkable. An indeterminate 3.5 x 3.1 cm left adrenal mass. Statistically this likely represents an adenoma. A 1 cm hyperdense nodule upper pole left kidney. A poorly defined 2.8 cm nodule upper pole left kidney. There is no evidence of hydronephrosis, nephrolithiasis, nor ureterolithiasis on the left. Right kidney is surgically absent. No definite masses in the postsurgical bed.  Within the limitations of  a noncontrast CT no further abdominal nor pelvic masses, free fluid, loculated fluid collections. No abdominal aortic aneurysm. Atherosclerotic calcifications within the aorta, mesenteric and iliac vessels.  There is diverticulosis within the colon without evidence of diverticulitis. The bowel is otherwise negative. The appendix is identified and is negative.  There no aggressive appearing osseous lesions.  No abdominal wall nor inguinal hernia appreciated.  IMPRESSION: Indeterminate left adrenal nodule and indeterminate nodules within the upper pole left kidney. Further characterization of these findings with MRI is recommended.  Diverticulosis  Pulmonary nodules within the right lung base. Atelectasis versus scarring within the lung bases and trace effusions.   Electronically Signed   By: Margaree Mackintosh M.D.   On: 10/18/2013 02:47   Dg Elbow Complete Left  10/16/2013   CLINICAL DATA:  Pain  EXAM: LEFT ELBOW - COMPLETE 3+ VIEW  COMPARISON:  None.  FINDINGS: Frontal, lateral, and bilateral oblique views were obtained. There is no fracture, dislocation, or effusion. Joint spaces appear intact. No erosive change.  IMPRESSION: No fracture or effusion.  No appreciable arthropathic change.   Electronically Signed   By: Lowella Grip M.D.   On: 10/16/2013 13:42   Dg Forearm Left  10/16/2013   CLINICAL DATA:  Pain  EXAM: LEFT FOREARM - 2 VIEW  COMPARISON:  None.  FINDINGS: Frontal and lateral views were obtained. No fracture or dislocation. Joint spaces appear intact. No abnormal periosteal reaction.  IMPRESSION: No abnormality noted.   Electronically Signed   By: Lowella Grip M.D.   On: 10/16/2013 13:41   Ct Head Wo Contrast  10/16/2013   CLINICAL DATA:  Altered mental status  EXAM: CT HEAD WITHOUT CONTRAST  TECHNIQUE: Contiguous axial images were obtained from the base of the skull through the vertex without intravenous contrast.  COMPARISON:  May 15, 2013  FINDINGS: Moderate diffuse atrophy is  stable. There is no mass, hemorrhage, extra-axial fluid collection, or midline shift. There is patchy small vessel disease throughout the centra semiovale bilaterally. Elsewhere gray-white compartments appear normal. There is no demonstrable acute infarct. Bony calvarium appears intact. Mastoid air cells are clear.  IMPRESSION: Atrophy with supratentorial small vessel disease, stable. No intracranial mass or hemorrhage seen. No acute infarct apparent.   Electronically Signed   By: Lowella Grip M.D.   On: 10/16/2013 09:41   Ct Chest Wo Contrast  10/16/2013   CLINICAL DATA:  Pulmonary nodule  EXAM: CT CHEST WITHOUT CONTRAST  TECHNIQUE: Multidetector CT imaging of the chest  was performed following the standard protocol without IV contrast.  COMPARISON:  Chest radiograph Oct 16, 2013  FINDINGS: There is a nodular opacity in the posterior segment of the left upper lobe measuring 1.6 x 1.4 cm. There is a second nodular opacity just inferior to the larger nodular opacity in the posterior segment of the left upper lobe measuring 0.7 x 0.8 cm. There is a nodular lesion in the superior segment of the left lower lobe measuring 1.3 x 1.2 cm. There is a nodular opacity in the medial segment of the right middle lobe measuring 1.5 x 1.2 cm. There is a nodular lesion in the superior segment of the right lower lobe measuring 1.1 x 1.0 cm. There is a nodular lesion in the posterior segment of the right lower lobe measuring 1.4 by 0.8 cm. There is a nodular lesion in the medial right base measuring 2.0 x 1.3 cm. There is a nodular lesion in the posterior right base measuring 1.6 x 1.3 cm. There is bibasilar atelectasis.  Heart is enlarged. There is a small pericardial effusion. There is extensive coronary artery calcification.  There are a few subcentimeter mediastinal lymph nodes, by size criteria there is no adenopathy.  In the visualized upper abdomen, there is a left adrenal mass which has attenuation values higher than is  expected for adenoma measuring 3.3 x 3.4 cm. There is a non cystic mass arising from the upper pole the left kidney measuring 1.4 x 1.0 cm. Visualized upper abdominal structures otherwise appear unremarkable on this noncontrast enhanced study.  Thyroid appears normal. There is degenerative change in the thoracic spine. There are no blastic or lytic bone lesions. There is evidence of old rib 1 trauma on the left with healing.  IMPRESSION: Multiple noncalcified parenchymal lung nodular lesions suspicious for metastatic foci. There is also a left adrenal mass which statistically in is most likely metastatic in etiology. There is a small solid mass arising from the upper pole of the left kidney which could represent an early neoplasm. Note that it would be unusual for a mass of this small size arising from the kidney to result in the apparent metastatic lesions in the lungs and left adrenal.  There is underlying atelectatic change in the bases. Heart is enlarged with small pericardial effusion. There is extensive coronary artery calcification.  There is no demonstrable adenopathy by size criteria on this study.   Electronically Signed   By: Lowella Grip M.D.   On: 10/16/2013 09:49   Mr Brain Wo Contrast  10/17/2013   CLINICAL DATA:  Altered mental status and worsening weakness.  EXAM: MRI HEAD WITHOUT CONTRAST  TECHNIQUE: Multiplanar, multiecho pulse sequences of the brain and surrounding structures were obtained without intravenous contrast.  COMPARISON:  Head CT 10/16/2013  FINDINGS: Coronal T2 weighted images were inadvertently not obtained.  There is no acute infarct. Small foci of remote microhemorrhage are present the inferior right cerebellum and right temporal lobe. Confluent T2 hyperintensities are present in the subcortical and deep cerebral white matter with patchy T2 hyperintensity noted in the pons, nonspecific but compatible with advanced chronic small vessel ischemic disease. There is mild  generalized cerebral atrophy. There is no evidence of mass, midline shift, or extra-axial fluid collection.  Orbits are unremarkable paranasal sinuses and mastoid air cells are clear. Major intracranial vascular flow voids are unremarkable.  IMPRESSION: 1. No evidence of acute intracranial abnormality. 2. Advanced chronic small vessel ischemic disease.   Electronically Signed   By: Zenia Resides  Jeralyn Ruths   On: 10/17/2013 08:47   Dg Chest Port 1 View  10/16/2013   CLINICAL DATA:  Weakness and hypertension  EXAM: PORTABLE CHEST - 1 VIEW  COMPARISON:  May 15, 2013 and January 02, 2012  FINDINGS: There is a 1.1 x 1.0 cm nodular opacity in the right mid lung. There is no edema or consolidation. Heart is enlarged with normal pulmonary vascularity. No adenopathy. There is atherosclerotic change in the aorta.  IMPRESSION: 1.1 x 1.0 cm nodular passed in the right mid lung. Advise correlation with noncontrast enhanced chest CT to further assess. No edema or consolidation. Stable cardiomegaly.   Electronically Signed   By: Lowella Grip M.D.   On: 10/16/2013 08:54    Assessment and Plan:  1. Bilateral lung masses 2. history of a right nephrectomy-? For treatment of a malignancy 3. Left renal mass on the CT abdomen 10/18/2013 4. History of dementia 5. Diabetes 6. Urinary tract infection 7. Anemia 8. Acute encephalopathy-improved   Ms. Heckendorn has bilateral nodular lung lesions on a CT of the chest. These have the appearance of metastases. She has a left renal mass and a history of a right nephrectomy. The most likely diagnosis is metastatic renal cell carcinoma. However the differential diagnosis includes metastatic carcinoma from other sites and benign conditions.  Recommendations: 1. Followup records from the right nephrectomy-I will discuss this history with her family when they are available 2. Outpatient staging PET scan to look for an additional sites of metastatic disease and a primary tumor site 3.  Interventional radiology guided biopsy of a metastatic lesion after the PET scan 4. I will check on her 10/19/2013 and we will schedule outpatient followup     Ladell Pier, MD 10/18/2013, 3:28 PM

## 2013-10-18 NOTE — Progress Notes (Signed)
Patient is stable.  More alert.  Not oriented to place or year.  Family in the room throughout the day. Patient's blood sugar has been high with coverage every time.  Last blood sugar was 336 at 1600.  Covered with 7Units of insulin.  Patient has no complaints of pain.

## 2013-10-18 NOTE — Evaluation (Signed)
Physical Therapy Evaluation Patient Details Name: Katie Dawson MRN: [id number] DOB: 20-Oct-1933 Today's Date: 10/18/2013   History of Present Illness  Adm 10/16/13 with AMS, +UTI, CT of the chest showed multiple nodular lesions in the lung suspicious for metastatic foci PMHx- dementia, DM, HTN, gout, Rt nephrectomy   Clinical Impression  Pt admitted with UTI and AMS. Currently limited by Rt knee, bil ankle, and Lt forearm pain. Noted ? gout per MD notes and pain control is complicated by kidney function. If pain can be controlled, anticipate pt can return home with family. However, the longer the pt is off her feet, the more likely she will need post-acute therapy prior to returning home. Pt currently with functional limitations due to the deficits listed below (see PT Problem List). Pt will benefit from skilled PT to increase their independence and safety with mobility to allow discharge to the venue listed below.       Follow Up Recommendations Home health PT;Supervision/Assistance - 24 hour (note--primary caregivers not present on eval to discuss)    Equipment Recommendations  None recommended by PT    Recommendations for Other Services       Precautions / Restrictions Precautions Precautions: Fall      Mobility  Bed Mobility Overal bed mobility: Needs Assistance Bed Mobility: Rolling;Sidelying to Sit;Sit to Sidelying Rolling: Min assist Sidelying to sit: Min assist     Sit to sidelying: Mod assist General bed mobility comments: noted pt's linens wet due to incontinence; rolling for pericare and changing pads; using rails to roll; needs assist for lower body; assist also to scoot to EOB  Transfers Overall transfer level: Needs assistance Equipment used: Rolling walker (2 wheeled) Transfers: Sit to/from Stand           General transfer comment: unable despite attempts x 3 (even with niece assisting); reports bil ankle pain and Rt knee pain  Ambulation/Gait                 Stairs            Wheelchair Mobility    Modified Rankin (Stroke Patients Only)       Balance Overall balance assessment: Needs assistance Sitting-balance support: No upper extremity supported;Feet supported Sitting balance-Leahy Scale: Fair                                       Pertinent Vitals/Pain bil ankles with attempt to weight bear; Rt knee with ROM; Lt forearm (elbow to wrist) with movement  patient repositioned for comfort; asleep by end of session RN made aware    Home Living Family/patient expects to be discharged to:: Private residence Living Arrangements: Children Available Help at Discharge: Family;Available 24 hours/day           Home Equipment: Walker - 2 wheels Additional Comments: Niece present and provided some information; she believes family is managing well and will want to take pt home    Prior Function Level of Independence: Needs assistance   Gait / Transfers Assistance Needed: niece uncertain; pt reports uses RW "sometimes" especially if outside home           Hand Dominance   Dominant Hand: Right    Extremity/Trunk Assessment   Upper Extremity Assessment: Generalized weakness;LUE deficits/detail       LUE Deficits / Details: pt with pain Lt forearm (noted xrays negative); pt with limited use of  LUE due to pain   Lower Extremity Assessment: RLE deficits/detail;LLE deficits/detail;Generalized weakness RLE Deficits / Details: limited knee flexion to 100 degrees due to reports of pain; ankle pain noted with attempts to weight bear (?gout); strength grossly 4/5 LLE Deficits / Details: AROM WNL, strength grossly 4/5; ankle pain with attempt to weight bear  Cervical / Trunk Assessment: Normal  Communication   Communication: No difficulties  Cognition Arousal/Alertness: Awake/alert Behavior During Therapy: WFL for tasks assessed/performed Overall Cognitive Status: History of cognitive impairments -  at baseline                      General Comments      Exercises        Assessment/Plan    PT Assessment Patient needs continued PT services  PT Diagnosis Difficulty walking;Acute pain   PT Problem List Decreased strength;Decreased range of motion;Decreased activity tolerance;Decreased mobility;Decreased cognition;Decreased knowledge of use of DME;Impaired sensation;Obesity;Pain  PT Treatment Interventions DME instruction;Gait training;Stair training;Functional mobility training;Therapeutic activities;Therapeutic exercise;Balance training;Patient/family education   PT Goals (Current goals can be found in the Care Plan section) Acute Rehab PT Goals Patient Stated Goal: decr pain, be able to walk PT Goal Formulation: With patient/family Time For Goal Achievement: 10/25/13 Potential to Achieve Goals: Good    Frequency Min 3X/week   Barriers to discharge        Co-evaluation               End of Session Equipment Utilized During Treatment: Gait belt Activity Tolerance: Patient limited by pain Patient left: in bed;with call bell/phone within reach;with bed alarm set;with family/visitor present Nurse Communication: Mobility status;Other (comment) (pain in ankles and Rt knee)         Time: 6195-0932 PT Time Calculation (min): 40 min   Charges:   PT Evaluation $Initial PT Evaluation Tier I: 1 Procedure PT Treatments $Therapeutic Activity: 23-37 mins   PT G CodesJeanie Dawson Katie Dawson November 02, 2013, 2:24 PM Pager [phone]

## 2013-10-18 NOTE — Progress Notes (Addendum)
[facility] PROGRESS NOTE  Assessment/Plan: Encephalopathy acute due to  UTI (urinary tract infection): - Started on IV rocephin U.C. E. Coli sensitive to keflex. - Has remained afebrile, still leukocytosis. - Confusion has resolved.   Hypoglycemia: - Pending Hemoglobin A1c, Hold Lantus and glipizide. - Start SSI.  Hypokalemia: - Replete and recheck in am.  Evolving Right Great toe pain/Left elbow area pain: - Does have a history of gout.  Probable Metastatic Cancer: - CT chest findings concerning for underlying metastatic cancer, primary unknown at this time. - Has solitary kidney with stage II-III chronic kidney disease, will need to be very cautious before we give IV contrast.  - CT abd and pelvis with contrast showed adrenal mass. Will ask IR for Biopsy.  ANEMIA-IRON DEFICIENCY: - Hemoglobin stable, resume iron supplementation.  ANXIETY  - Continue as needed BuSpar   Dementia  - Complicated by delirium/encephalopathy from metabolic abnormalities.  - CT head negative. Continue with Aricept and Risperdal.   DIABETES MELLITUS, TYPE II  - 6.9 A1c. - BG significantly high resume SSI.  Code Status: full Family Communication: none  Disposition Plan: inpatient   Consultants:  none  Procedures: MRI 5.18.2015: No evidence of acute intracranial abnormality.  Advanced chronic small vessel ischemic disease    Antibiotics:  Rocephin  HPI/Subjective: No complains  Objective: Filed Vitals:   10/17/13 1442 10/17/13 2023 10/18/13 0448 10/18/13 0815  BP: 126/56 151/63 205/61 153/73  Pulse: 67 73 71 70  Temp: 98.7 F (37.1 C) 98 F (36.7 C) 99.1 F (37.3 C) 99.7 F (37.6 C)  TempSrc: Oral Oral Oral Oral  Resp: 18 20  20   Height:      Weight:   120.2 kg (264 lb 15.9 oz)   SpO2: 94% 94% 95% 95%    Intake/Output Summary (Last 24 hours) at 10/18/13 1300 Last data filed at 10/18/13 0949  Gross per 24 hour  Intake    723 ml  Output      1 ml  Net     722 ml   Filed Weights   10/16/13 1622 10/17/13 0402 10/18/13 0448  Weight: 116.6 kg (257 lb 0.9 oz) 114.7 kg (252 lb 13.9 oz) 120.2 kg (264 lb 15.9 oz)    Exam:  General: Alert, awake, oriented x3, in no acute distress.  HEENT: No bruits, no goiter.  Heart: Regular rate and rhythm, without murmurs, rubs, gallops.  Lungs: Good air movement, clear Abdomen: Soft, nontender, nondistended, positive bowel sounds.    Data Reviewed: Basic Metabolic Panel:  Recent Labs Lab 10/16/13 0830 10/16/13 0855 10/17/13 0500  NA 149* 149* 146  K 2.9* 2.7* 3.0*  CL 108 107 107  CO2 29  --  27  GLUCOSE 48* 52* 251*  BUN 13 11 12   CREATININE 1.15* 1.30* 0.97  CALCIUM 9.4  --  8.9   Liver Function Tests:  Recent Labs Lab 10/16/13 0830  AST 9  ALT 8  ALKPHOS 81  BILITOT 0.4  PROT 6.6  ALBUMIN 2.5*   No results found for this basename: LIPASE, AMYLASE,  in the last 168 hours No results found for this basename: AMMONIA,  in the last 168 hours CBC:  Recent Labs Lab 10/16/13 0830 10/16/13 0855 10/17/13 0500  WBC 12.5*  --  13.5*  NEUTROABS 9.4*  --   --   HGB 11.7* 12.6 11.3*  HCT 36.6 37.0 35.3*  MCV 89.1  --  88.5  PLT 340  --  332  Cardiac Enzymes: No results found for this basename: CKTOTAL, CKMB, CKMBINDEX, TROPONINI,  in the last 168 hours BNP (last 3 results)  Recent Labs  10/16/13 0830  PROBNP 570.2*   CBG:  Recent Labs Lab 10/18/13 0038 10/18/13 0408 10/18/13 0800 10/18/13 1051 10/18/13 1219  GLUCAP 171* 184* 125* 213* 202*    Recent Results (from the past 240 hour(s))  URINE CULTURE     Status: None   Collection Time    10/16/13  2:17 PM      Result Value Ref Range Status   Specimen Description URINE, CATHETERIZED   Final   Special Requests NONE   Final   Culture  Setup Time     Final   Value: 10/16/2013 22:53     Performed at [place]     Final   Value: >=100,000 COLONIES/ML     Performed at [facility]   Culture     Final   Value: ESCHERICHIA COLI     Performed at [facility]   Report Status 10/18/2013 FINAL   Final   Organism ID, Bacteria ESCHERICHIA COLI   Final     Studies: Ct Abdomen Pelvis Wo Contrast  10/18/2013   CLINICAL DATA:  unknow primary cancer  EXAM:  CT ABDOMEN AND PELVIS WITHOUT CONTRAST  TECHNIQUE: Multidetector CT imaging of the abdomen and pelvis was performed following the standard protocol without IV contrast.  COMPARISON:  US RENAL dated 05/16/2013; CT CHEST W/O CM dated 10/16/2013  FINDINGS: None calcified pulmonary nodules within the right lung base. Trace bilateral effusions are appreciated. There is scarring versus atelectasis within the lung bases.  Noncontrast evaluation of the liver, spleen, right adrenal is unremarkable. An indeterminate 3.5 x 3.1 cm left adrenal mass. Statistically this likely represents an adenoma. A 1 cm hyperdense nodule upper pole left kidney. A poorly defined 2.8 cm nodule upper pole left kidney. There is no evidence of hydronephrosis, nephrolithiasis, nor ureterolithiasis on the left. Right kidney is surgically absent. No definite masses in the postsurgical bed.  Within the limitations of a noncontrast CT no further abdominal nor pelvic masses, free fluid, loculated fluid collections. No abdominal aortic aneurysm. Atherosclerotic calcifications within the aorta, mesenteric and iliac vessels.  There is diverticulosis within the colon without evidence of diverticulitis. The bowel is otherwise negative. The appendix is identified and is negative.  There no aggressive appearing osseous lesions.  No abdominal wall nor inguinal hernia appreciated.  IMPRESSION: Indeterminate left adrenal nodule and indeterminate nodules within the upper pole left kidney. Further characterization of these findings with MRI is recommended.  Diverticulosis  Pulmonary nodules within the right lung base. Atelectasis versus scarring within the lung bases and trace  effusions.   Electronically Signed   By: Margaree Mackintosh M.D.   On: 10/18/2013 02:47   Dg Elbow Complete Left  10/16/2013   CLINICAL DATA:  Pain  EXAM: LEFT ELBOW - COMPLETE 3+ VIEW  COMPARISON:  None.  FINDINGS: Frontal, lateral, and bilateral oblique views were obtained. There is no fracture, dislocation, or effusion. Joint spaces appear intact. No erosive change.  IMPRESSION: No fracture or effusion.  No appreciable arthropathic change.   Electronically Signed   By: Lowella Grip M.D.   On: 10/16/2013 13:42   Dg Forearm Left  10/16/2013   CLINICAL DATA:  Pain  EXAM: LEFT FOREARM - 2 VIEW  COMPARISON:  None.  FINDINGS: Frontal and lateral views were obtained. No fracture or  dislocation. Joint spaces appear intact. No abnormal periosteal reaction.  IMPRESSION: No abnormality noted.   Electronically Signed   By: Lowella Grip M.D.   On: 10/16/2013 13:41   Mr Brain Wo Contrast  10/17/2013   CLINICAL DATA:  Altered mental status and worsening weakness.  EXAM: MRI HEAD WITHOUT CONTRAST  TECHNIQUE: Multiplanar, multiecho pulse sequences of the brain and surrounding structures were obtained without intravenous contrast.  COMPARISON:  Head CT 10/16/2013  FINDINGS: Coronal T2 weighted images were inadvertently not obtained.  There is no acute infarct. Small foci of remote microhemorrhage are present the inferior right cerebellum and right temporal lobe. Confluent T2 hyperintensities are present in the subcortical and deep cerebral white matter with patchy T2 hyperintensity noted in the pons, nonspecific but compatible with advanced chronic small vessel ischemic disease. There is mild generalized cerebral atrophy. There is no evidence of mass, midline shift, or extra-axial fluid collection.  Orbits are unremarkable paranasal sinuses and mastoid air cells are clear. Major intracranial vascular flow voids are unremarkable.  IMPRESSION: 1. No evidence of acute intracranial abnormality. 2. Advanced chronic small  vessel ischemic disease.   Electronically Signed   By: Logan Bores   On: 10/17/2013 08:47    Scheduled Meds: . amLODipine  10 mg Oral Daily  . cefTRIAXone (ROCEPHIN)  IV  1 g Intravenous Q24H  . cloNIDine  0.1 mg Oral BID  . donepezil  10 mg Oral QHS  . enoxaparin (LOVENOX) injection  40 mg Subcutaneous Q24H  . feeding supplement (GLUCERNA SHAKE)  237 mL Oral Q24H  . furosemide  40 mg Intravenous Q12H  . gabapentin  100 mg Oral TID  . insulin aspart  0-9 Units Subcutaneous 6 times per day  . isosorbide mononitrate  60 mg Oral QHS  . labetalol  200 mg Oral BID  . risperiDONE  0.25 mg Oral Daily  . risperiDONE  0.5 mg Oral QHS  . simvastatin  20 mg Oral q1800  . sodium chloride  3 mL Intravenous Q12H   Continuous Infusions:    [place] [facility] Pager [phone]. If 8PM-8AM, please contact night-coverage at [email or web address], password [facility] 10/18/2013, 1:00 PM  LOS: 2 days

## 2013-10-19 DIAGNOSIS — F0390 Unspecified dementia without behavioral disturbance: Secondary | ICD-10-CM

## 2013-10-19 DIAGNOSIS — I10 Essential (primary) hypertension: Secondary | ICD-10-CM

## 2013-10-19 DIAGNOSIS — Z905 Acquired absence of kidney: Secondary | ICD-10-CM

## 2013-10-19 LAB — GLUCOSE, CAPILLARY
GLUCOSE-CAPILLARY: 215 mg/dL — AB (ref 70–99)
Glucose-Capillary: 201 mg/dL — ABNORMAL HIGH (ref 70–99)
Glucose-Capillary: 182 mg/dL — ABNORMAL HIGH (ref 70–99)
Glucose-Capillary: 276 mg/dL — ABNORMAL HIGH (ref 70–99)
Glucose-Capillary: 322 mg/dL — ABNORMAL HIGH (ref 70–99)

## 2013-10-19 MED ORDER — INSULIN GLARGINE 100 UNIT/ML ~~LOC~~ SOLN
25.0000 [IU] | Freq: Every day | SUBCUTANEOUS | Status: DC
  Administered 2013-10-20: 25 [IU] via SUBCUTANEOUS
  Filled 2013-10-19: qty 0.25

## 2013-10-19 MED ORDER — FUROSEMIDE 20 MG PO TABS
20.0000 mg | ORAL_TABLET | Freq: Every day | ORAL | Status: DC
  Administered 2013-10-19 – 2013-10-20 (×2): 20 mg via ORAL
  Filled 2013-10-19 (×2): qty 1

## 2013-10-19 MED ORDER — PREDNISONE 20 MG PO TABS
20.0000 mg | ORAL_TABLET | Freq: Every day | ORAL | Status: DC
  Administered 2013-10-19: 20 mg via ORAL
  Filled 2013-10-19 (×3): qty 1

## 2013-10-19 NOTE — Progress Notes (Signed)
Patient evaluated for community based chronic disease management services with [facility] as a benefit of patient's [employer]. Spoke with her daughter at bedside to explain [facility] services.  She is anticipated to go to SNF for short term rehabilitation at this time.  [facility] will not engage post SNF discharge unless requested by the family or PCP.  Daughter would like to have her mothers [facility] service eligibility assessed.  Unit SW will alert daughter that this can be done at the SNF of choice.  Left contact information and [facility] literature at bedside. Made Inpatient Case Manager aware that [facility] following. Of note, [facility] Care Management services does not replace or interfere with any services that are arranged by inpatient case management or social work.  For additional questions or referrals please contact Corliss Blacker BSN RN [facility] Liaison at [phone].

## 2013-10-19 NOTE — Progress Notes (Signed)
Inpatient Diabetes Program Recommendations  AACE/ADA: New Consensus Statement on Inpatient Glycemic Control (2013)  Target Ranges:  Prepandial:   less than 140 mg/dL      Peak postprandial:   less than 180 mg/dL (1-2 hours)      Critically ill patients:  140 - 180 mg/dL  Results for Katie Dawson, Katie Dawson (MRN [id number]) as of 10/19/2013 13:26  Ref. Range 10/18/2013 16:08 10/18/2013 19:40 10/19/2013 01:58 10/19/2013 06:30 10/19/2013 10:21  Glucose-Capillary Latest Range: 70-99 mg/dL 336 (H) 239 (H) 201 (H) 182 (H) 276 (H)   Inpatient Diabetes Program Recommendations Insulin - Meal Coverage: consider adding Novolog 4 units TID for elevated postprandial CBGs during steroid therapy Thank you  Raoul Pitch BSN, RN,CDE Inpatient Diabetes Coordinator [phone] (team pager)

## 2013-10-19 NOTE — Progress Notes (Signed)
Physical Therapy Treatment Patient Details Name: Katie Dawson MRN: [id number] DOB: Oct 06, 1933 Today's Date: 10/19/2013    History of Present Illness Adm 10/16/13 with AMS, +UTI, CT of the chest showed multiple nodular lesions in the lung suspicious for metastatic foci PMHx- dementia, DM, HTN, gout, Rt nephrectomy    PT Comments    Pt continues to be greatly limited by pain in Rt LE predominately. Pt c/o pain in Lt UE intermittently. Pt very lethargic during session today. Was able to follow commands for exercises with incr time and max cues to stay on task. Daughter/caregiver present today during session to discuss D/C disposition. Pt does not have 24/7 (A) at home; pt and daughter agreeable at this time for SNF for post acute rehab due to lack of mobility. CSW and RN notified at this time. Will cont to follow per POC.   Follow Up Recommendations  SNF;Supervision/Assistance - 24 hour     Equipment Recommendations  None recommended by PT    Recommendations for Other Services       Precautions / Restrictions Precautions Precautions: Fall Restrictions Weight Bearing Restrictions: No    Mobility  Bed Mobility Overal bed mobility: Needs Assistance;+2 for physical assistance Bed Mobility: Sidelying to Sit;Rolling;Sit to Sidelying Rolling: Mod assist Sidelying to sit: +2 for physical assistance;Min assist;HOB elevated     Sit to sidelying: Min assist General bed mobility comments: pt greatly limited with bed mobility today due to pain in Rt LE; requires incr time; max cues for encouragement and sequencing; (A) to support Rt LE while second person (A) with elevating trunk and controlling descent of trunk for bed mobility; pt with eyes closed but would follow 1 step commands consistently   Transfers                 General transfer comment: did not attempt today due to pain and lethargy ; session focused on EOB activities   Ambulation/Gait                 Stairs            Wheelchair Mobility    Modified Rankin (Stroke Patients Only)       Balance Overall balance assessment: Needs assistance Sitting-balance support: Feet supported;Single extremity supported Sitting balance-Leahy Scale: Poor Sitting balance - Comments: leaning to Rt; cues and min tactile cues to return to midline; requires use of UE support and max encourageemnt to maintain upright sitting position at EOB  Postural control: Right lateral lean                          Cognition Arousal/Alertness: Awake/alert Behavior During Therapy: WFL for tasks assessed/performed Overall Cognitive Status: History of cognitive impairments - at baseline                      Exercises General Exercises - Lower Extremity Ankle Circles/Pumps: AROM;AAROM;Both;10 reps;Supine;Other (comment) (c/o pain in Rt LE) Long Arc Quad: AROM;Both;10 reps;Seated (limited ROM in Rt LE ) Heel Slides: Left;5 reps;Other (comment);AAROM (unable to perform on Rt due to pain ) Hip ABduction/ADduction: Left;5 reps;Supine;Right;AAROM    General Comments General comments (skin integrity, edema, etc.): daughter present during session and able to discuss D/C disposition/recommendations       Pertinent Vitals/Pain C/o 10/10 pain in Rt LE with light touch and/or movement; pt was premedicated by RN and lethargic during session . patient repositioned for comfort with pillow under Rt LE  Home Living                      Prior Function            PT Goals (current goals can now be found in the care plan section) Acute Rehab PT Goals Patient Stated Goal: decr pain, be able to walk PT Goal Formulation: With patient/family Time For Goal Achievement: 10/25/13 Potential to Achieve Goals: Good Progress towards PT goals: Not progressing toward goals - comment (due to pain and lethargy)    Frequency  Min 2X/week    PT Plan Discharge plan needs to be updated;Frequency needs to be  updated    Co-evaluation             End of Session   Activity Tolerance: Patient limited by pain;Patient limited by lethargy Patient left: in bed;with bed alarm set;with call bell/phone within reach;with nursing/sitter in room;with family/visitor present     Time: 9163-8466 PT Time Calculation (min): 25 min  Charges:  $Therapeutic Exercise: 8-22 mins $Therapeutic Activity: 8-22 mins                    G CodesKennis Carina [place] , [place]  [phone]  10/19/2013, 3:33 PM

## 2013-10-19 NOTE — Progress Notes (Signed)
[facility] PROGRESS NOTE  Assessment/Plan: Encephalopathy acute due to  E. Coli UTI (urinary tract infection): - Started on IV rocephin on admission and following urine cx's has been transitioned to keflex. - Has remained afebrile, still with mild leukocytosis, but trending down. - Confusion has resolved.   Hypoglycemia: - A1C 6.9 -continue SSI and lantus  Hypokalemia: - Repleted  Evolving Right Great toe pain/ankles/ knees/Left elbow area pain: - Does have a history of gout. -will treat with prednisone trial and follow response  Probable Metastatic Cancer: - CT chest findings concerning for underlying metastatic cancer, primary unknown at this time. - Has solitary kidney with stage II-III chronic kidney disease, will need to be very cautious before we give IV contrast.  - CT abd and pelvis with contrast showed adrenal nodule -plan is for Pet Scan and determine best site for biopsy -outpatient follow up with oncology  ANEMIA-IRON DEFICIENCY: - Hemoglobin stable, continue iron supplementation.  ANXIETY  - Continue PRN BuSpar   Dementia  - Complicated by delirium/encephalopathy from metabolic abnormalities and UTI.  - CT head negative. Continue with Aricept and Risperdal.  -continue tx fo infection  DIABETES MELLITUS, TYPE II  - 6.9 A1c. - continue SSI  Code Status: full Family Communication: daughter Disposition Plan: inpatient; home with [facility] services at discharge   Consultants:  IR  oncology  Procedures: MRI 5.18.2015: No evidence of acute intracranial abnormality.  Advanced chronic small vessel ischemic disease   Antibiotics:  Rocephin  HPI/Subjective: Afebrile, feeling somewhat better; more alert/oriented. Complaining of left elbow, bilat knees and bilat ankle pain.  Objective: Filed Vitals:   10/18/13 2330 10/19/13 0622 10/19/13 1334 10/19/13 2128  BP: 126/36 180/60 150/48 161/60  Pulse: 75 76 72 73  Temp:  98.7 F (37.1 C) 99.1 F  (37.3 C) 99.4 F (37.4 C)  TempSrc:  Oral Oral Oral  Resp:  18 20 18   Height:      Weight:  114.6 kg (252 lb 10.4 oz)    SpO2:  96% 94% 98%    Intake/Output Summary (Last 24 hours) at 10/19/13 2346 Last data filed at 10/19/13 1700  Gross per 24 hour  Intake    480 ml  Output      0 ml  Net    480 ml   Filed Weights   10/17/13 0402 10/18/13 0448 10/19/13 0622  Weight: 114.7 kg (252 lb 13.9 oz) 120.2 kg (264 lb 15.9 oz) 114.6 kg (252 lb 10.4 oz)    Exam:  General: Alert, awake, oriented x2, in mild distress due to pain in her joints (especially left elbow, bilat ankles and bilat knees.  HEENT: No bruits, no goiter.  Heart: Regular rate and rhythm, without murmurs, rubs, gallops.  Lungs: Good air movement, no wheezing or crackles Abdomen: Soft, nontender, nondistended, positive bowel sounds.    Data Reviewed: Basic Metabolic Panel:  Recent Labs Lab 10/16/13 0830 10/16/13 0855 10/17/13 0500  NA 149* 149* 146  K 2.9* 2.7* 3.0*  CL 108 107 107  CO2 29  --  27  GLUCOSE 48* 52* 251*  BUN 13 11 12   CREATININE 1.15* 1.30* 0.97  CALCIUM 9.4  --  8.9   Liver Function Tests:  Recent Labs Lab 10/16/13 0830  AST 9  ALT 8  ALKPHOS 81  BILITOT 0.4  PROT 6.6  ALBUMIN 2.5*   CBC:  Recent Labs Lab 10/16/13 0830 10/16/13 0855 10/17/13 0500  WBC 12.5*  --  13.5*  NEUTROABS 9.4*  --   --  HGB 11.7* 12.6 11.3*  HCT 36.6 37.0 35.3*  MCV 89.1  --  88.5  PLT 340  --  332   BNP (last 3 results)  Recent Labs  10/16/13 0830  PROBNP 570.2*   CBG:  Recent Labs Lab 10/19/13 0158 10/19/13 0630 10/19/13 1021 10/19/13 1649 10/19/13 2123  GLUCAP 201* 182* 276* 215* 322*    Recent Results (from the past 240 hour(s))  URINE CULTURE     Status: None   Collection Time    10/16/13  2:17 PM      Result Value Ref Range Status   Specimen Description URINE, CATHETERIZED   Final   Special Requests NONE   Final   Culture  Setup Time     Final   Value: 10/16/2013  22:53     Performed at [place]     Final   Value: >=100,000 COLONIES/ML     Performed at [facility]   Culture     Final   Value: ESCHERICHIA COLI     Performed at [facility]   Report Status 10/18/2013 FINAL   Final   Organism ID, Bacteria ESCHERICHIA COLI   Final     Studies: No results found.  Scheduled Meds: . amLODipine  10 mg Oral Daily  . cephALEXin  500 mg Oral Q12H  . cloNIDine  0.1 mg Oral BID  . donepezil  10 mg Oral QHS  . enoxaparin (LOVENOX) injection  40 mg Subcutaneous Q24H  . feeding supplement (GLUCERNA SHAKE)  237 mL Oral Q24H  . furosemide  20 mg Oral Daily  . gabapentin  100 mg Oral TID  . insulin aspart  0-9 Units Subcutaneous TID AC & HS  . isosorbide mononitrate  60 mg Oral QHS  . labetalol  200 mg Oral BID  . predniSONE  20 mg Oral Q breakfast  . risperiDONE  0.25 mg Oral Daily  . risperiDONE  0.5 mg Oral QHS  . simvastatin  20 mg Oral q1800  . sodium chloride  3 mL Intravenous Q12H   Continuous Infusions:   Time> 30 minutes  Barton Dubois  [facility] Pager [phone]. If 8PM-8AM, please contact night-coverage at [email or web address], password [facility] 10/19/2013, 11:46 PM  LOS: 3 days

## 2013-10-20 DIAGNOSIS — K219 Gastro-esophageal reflux disease without esophagitis: Secondary | ICD-10-CM

## 2013-10-20 DIAGNOSIS — IMO0002 Reserved for concepts with insufficient information to code with codable children: Secondary | ICD-10-CM

## 2013-10-20 DIAGNOSIS — F3289 Other specified depressive episodes: Secondary | ICD-10-CM

## 2013-10-20 DIAGNOSIS — F329 Major depressive disorder, single episode, unspecified: Secondary | ICD-10-CM

## 2013-10-20 DIAGNOSIS — N179 Acute kidney failure, unspecified: Secondary | ICD-10-CM

## 2013-10-20 LAB — GLUCOSE, CAPILLARY
GLUCOSE-CAPILLARY: 242 mg/dL — AB (ref 70–99)
Glucose-Capillary: 274 mg/dL — ABNORMAL HIGH (ref 70–99)
Glucose-Capillary: 230 mg/dL — ABNORMAL HIGH (ref 70–99)
Glucose-Capillary: 270 mg/dL — ABNORMAL HIGH (ref 70–99)

## 2013-10-20 MED ORDER — GLUCERNA SHAKE PO LIQD: 237.0000 mL | ORAL | Status: DC

## 2013-10-20 MED ORDER — FUROSEMIDE 20 MG PO TABS: 20.0000 mg | ORAL_TABLET | Freq: Every day | ORAL | Status: DC

## 2013-10-20 MED ORDER — SENNOSIDES-DOCUSATE SODIUM 8.6-50 MG PO TABS
2.0000 | ORAL_TABLET | Freq: Once | ORAL | Status: AC
  Administered 2013-10-20: 2 via ORAL
  Filled 2013-10-20: qty 2

## 2013-10-20 MED ORDER — PREDNISONE 20 MG PO TABS
20.0000 mg | ORAL_TABLET | Freq: Every day | ORAL | Status: DC
Start: 2013-10-20 — End: 2013-10-20
  Administered 2013-10-20: 20 mg via ORAL
  Filled 2013-10-20 (×2): qty 1

## 2013-10-20 MED ORDER — HYDROCODONE-ACETAMINOPHEN 5-325 MG PO TABS
1.0000 | ORAL_TABLET | Freq: Four times a day (QID) | ORAL | Status: DC | PRN
Start: 2013-10-20 — End: 2013-12-02

## 2013-10-20 MED ORDER — POTASSIUM CHLORIDE ER 10 MEQ PO TBCR: 20.0000 meq | EXTENDED_RELEASE_TABLET | Freq: Every day | ORAL | Status: DC

## 2013-10-20 MED ORDER — DSS 100 MG PO CAPS: 100.0000 mg | ORAL_CAPSULE | Freq: Two times a day (BID) | ORAL | Status: DC

## 2013-10-20 MED ORDER — BUSPIRONE HCL 7.5 MG PO TABS: 7.5000 mg | ORAL_TABLET | Freq: Three times a day (TID) | ORAL | Status: DC | PRN

## 2013-10-20 MED ORDER — DOCUSATE SODIUM 100 MG PO CAPS
100.0000 mg | ORAL_CAPSULE | Freq: Two times a day (BID) | ORAL | Status: DC
  Administered 2013-10-20: 100 mg via ORAL
  Filled 2013-10-20 (×2): qty 1

## 2013-10-20 MED ORDER — CEPHALEXIN 500 MG PO CAPS: 500.0000 mg | ORAL_CAPSULE | Freq: Two times a day (BID) | ORAL | Status: DC

## 2013-10-20 MED ORDER — PREDNISONE 20 MG PO TABS: 20.0000 mg | ORAL_TABLET | Freq: Every day | ORAL | Status: AC

## 2013-10-20 NOTE — Discharge Summary (Signed)
Physician Discharge Summary  Katie Dawson [id number] DOB: 04/24/1934 DOA: 10/16/2013  PCP: Cathlean Cower, MD  Admit date: 10/16/2013 Discharge date: 10/20/2013  Time spent: >30 minutes  Recommendations for Outpatient Follow-up:  1. BMET to follow electrolytes and renal function 2. Needs follow up with oncology service for further evaluation and treatment of presumably metastatic disease 3. Reassess BP and adjust medications as needed  Discharge Diagnoses:  Principal Problem:   Encephalopathy acute Active Problems:   DIABETES MELLITUS, TYPE II   ANEMIA-IRON DEFICIENCY   ANXIETY   HYPERTENSION   Depression   Dementia   Peripheral neuropathy   Hypoglycemia   UTI (urinary tract infection)   Discharge Condition: stable and improved. Will discharge to SNF for rehabilitation.   Diet recommendation: low sodium diet and low carbohydrates  Filed Weights   10/18/13 0448 10/19/13 0622 10/20/13 0534  Weight: 120.2 kg (264 lb 15.9 oz) 114.6 kg (252 lb 10.4 oz) 114 kg (251 lb 5.2 oz)    History of present illness:  80 y.o. female with a Past Medical History of Dementia, diabetes, hypertension, gout,s/p right nephrectomy who presents today with the above noted complaint. Please note that the patient is confused and most of this history is obtained from the patient's daughter Davy Pique at bedside. Apparently over the past 2-3 days patient has gotten more confused than usual baseline, patient's daughter has noted that patient is having difficulty raising the left upper extremity because of pain. Yesterday patient had significant difficulty ambulating. Daughter also notes significant decrease in her ambulation, and claims that the patient is too weak to walk. Patient normally is able to get around the house with the help of a walker. This morning patient's daughter was unable to help patient up, as a result EMS was called, CBG at the field was 40, patient was given oral glucose, and then brought to  the emergency room. In the ED, her sugar was again noted to be 49, she was given 1 amp of D50. She was also found to have foul-smelling urine that was consistent with a UTI, she was also found to be significantly hypokalemic.A CT scan of the head was negative for acute abnormalities, chest x-ray was suspicious for a nodular mass in the right lung. Patient subsequently underwent a CT of the chest showed multiple nodular lesions in the lung suspicious for metastatic foci. Patient was also found to have a left adrenal mass, and a small solid mass arising from the upper pole of the left kidney.  Hospital Course:  Encephalopathy acute due to E. Coli UTI (urinary tract infection):  - Started on IV rocephin on admission and following urine cx's has been transitioned to keflex; will treat for 6 more days to finish antibiotic therapy.  - Has remained afebrile, still with mild leukocytosis (but now steroids contributing)  - Confusion has resolved.   Hypokalemia:  - Repleted and now on maintenance therapy given use of lasix   Evolving Right Great toe pain/ankles/ knees/Left elbow area pain:  -Does have a history of gout.  -will treat with prednisone trial and physical rehab -patient will benefit down the road for use of allopurinol or uloric as part of gout maintenance/prevention treatment   Probable Metastatic Cancer:  - CT chest findings concerning for underlying metastatic cancer, primary unknown at this time.  - Has solitary kidney with stage II-III chronic kidney disease, will need to be very cautious with nephrotoxic agents and guarantee hydration.  - CT abd and pelvis with contrast  showed adrenal nodule  -plan is for Pet Scan and determine best site for biopsy  -outpatient follow up with oncology   ANEMIA-IRON DEFICIENCY:  - Hemoglobin stable, continue iron supplementation. -no signs of overt bleeding   ANXIETY  - Continue PRN BuSpar   Dementia  -Complicated by delirium/encephalopathy from  metabolic abnormalities and UTI.  -CT head and MRI negative. Continue with Aricept and Risperdal.  -continue tx fo infection  -provide supportive care  DIABETES MELLITUS, TYPE II  - 6.9 A1c.  - continue home hypoglycemic regimen  Chronic diastolic heart failure -Daily weight -Low sodium diet -Lasix 20mg  daily   Procedures:  MRI 5.18.2015: No evidence of acute intracranial abnormality. Advanced chronic small vessel ischemic disease   See below for x-ray reports  Consultations:  Oncology service  Discharge Exam: Filed Vitals:   10/20/13 1153  BP: 154/63  Pulse: 64  Temp:   Resp: 18   General: Alert, awake, oriented x2, complaining of pain in her joints (especially left elbow, bilat ankles and bilat knees); but overall improved in comparison to prior to discharge. Also with gain range of motion.Marland Kitchen  HEENT: No bruits, no goiter.  Heart: Regular rate and rhythm, without murmurs, rubs, gallops.  Lungs: Good air movement, no wheezing or crackles  Abdomen: Soft, nontender, nondistended, positive bowel sounds.   Discharge Instructions You were cared for by a hospitalist during your hospital stay. If you have any questions about your discharge medications or the care you received while you were in the hospital after you are discharged, you can call the unit and asked to speak with the hospitalist on call if the hospitalist that took care of you is not available. Once you are discharged, your primary care physician will handle any further medical issues. Please note that NO REFILLS for any discharge medications will be authorized once you are discharged, as it is imperative that you return to your primary care physician (or establish a relationship with a primary care physician if you do not have one) for your aftercare needs so that they can reassess your need for medications and monitor your lab values.  Discharge Instructions   Diet - low sodium heart healthy    Complete by:  As  directed      Discharge instructions    Complete by:  As directed   Maintain a good hydration Physical therapy and rehab as per SNF protocol Patient needs follow up with Dr. Benay Spice (oncology service) for [facility] and further workup of presumably metastatic disease. Take medications as prescribed Antibiotics to be use for 6 more days.            Medication List         amLODipine 10 MG tablet  Commonly known as:  NORVASC  Take 10 mg by mouth daily.     BD PEN NEEDLE NANO U/F 32G X 4 MM Misc  Generic drug:  Insulin Pen Needle  USE WITH LANTUS DAILY.     busPIRone 7.5 MG tablet  Commonly known as:  BUSPAR  Take 1 tablet (7.5 mg total) by mouth every 8 (eight) hours as needed (nerves).     cephALEXin 500 MG capsule  Commonly known as:  KEFLEX  Take 1 capsule (500 mg total) by mouth every 12 (twelve) hours. Treatment is for 6 more days     cloNIDine 0.1 MG tablet  Commonly known as:  CATAPRES  Take 0.1 mg by mouth 2 (two) times daily.     donepezil  10 MG tablet  Commonly known as:  ARICEPT  Take 1 tablet (10 mg total) by mouth at bedtime.     DSS 100 MG Caps  Take 100 mg by mouth 2 (two) times daily.     feeding supplement (GLUCERNA SHAKE) Liqd  Take 237 mLs by mouth daily.     furosemide 20 MG tablet  Commonly known as:  LASIX  Take 1 tablet (20 mg total) by mouth daily.     gabapentin 100 MG capsule  Commonly known as:  NEURONTIN  Take 1 capsule (100 mg total) by mouth 3 (three) times daily.     glipiZIDE 10 MG 24 hr tablet  Commonly known as:  GLUCOTROL XL  Take 20 mg by mouth daily with breakfast.     glucose blood test strip  Commonly known as:  BAYER CONTOUR NEXT TEST  Use as instructed     HYDROcodone-acetaminophen 5-325 MG per tablet  Commonly known as:  NORCO/VICODIN  Take 1 tablet by mouth every 6 (six) hours as needed for severe pain.     insulin glargine 100 UNIT/ML injection  Commonly known as:  LANTUS  Inject 0.6 mLs (60 Units total) into  the skin at bedtime.     Iron 325 (65 FE) MG Tabs  Take by mouth daily.     isosorbide mononitrate 60 MG 24 hr tablet  Commonly known as:  IMDUR  Take 60 mg by mouth at bedtime.     labetalol 200 MG tablet  Commonly known as:  NORMODYNE  Take 200 mg by mouth 2 (two) times daily.     Lancets Misc  Use as directed 1 per day     pravastatin 40 MG tablet  Commonly known as:  PRAVACHOL  Take 1 tablet (40 mg total) by mouth daily.     predniSONE 20 MG tablet  Commonly known as:  DELTASONE  Take 1 tablet (20 mg total) by mouth daily with breakfast.     risperiDONE 0.25 MG tablet  Commonly known as:  RISPERDAL  1 tab by mouth in the AM, then 4 tabs by mouth in the PM       No Known Allergies     Follow-up Information   Follow up with Betsy Coder, MD. (call office to set up appointment)    Specialty:  Oncology   Contact information:   [place] [address] [phone]       The results of significant diagnostics from this hospitalization (including imaging, microbiology, ancillary and laboratory) are listed below for reference.    Significant Diagnostic Studies: Ct Abdomen Pelvis Wo Contrast  10/18/2013   CLINICAL DATA:  unknow primary cancer  EXAM:  CT ABDOMEN AND PELVIS WITHOUT CONTRAST  TECHNIQUE: Multidetector CT imaging of the abdomen and pelvis was performed following the standard protocol without IV contrast.  COMPARISON:  US RENAL dated 05/16/2013; CT CHEST W/O CM dated 10/16/2013  FINDINGS: None calcified pulmonary nodules within the right lung base. Trace bilateral effusions are appreciated. There is scarring versus atelectasis within the lung bases.  Noncontrast evaluation of the liver, spleen, right adrenal is unremarkable. An indeterminate 3.5 x 3.1 cm left adrenal mass. Statistically this likely represents an adenoma. A 1 cm hyperdense nodule upper pole left kidney. A poorly defined 2.8 cm nodule upper pole left kidney. There is no evidence  of hydronephrosis, nephrolithiasis, nor ureterolithiasis on the left. Right kidney is surgically absent. No definite masses in the postsurgical bed.  Within  the limitations of a noncontrast CT no further abdominal nor pelvic masses, free fluid, loculated fluid collections. No abdominal aortic aneurysm. Atherosclerotic calcifications within the aorta, mesenteric and iliac vessels.  There is diverticulosis within the colon without evidence of diverticulitis. The bowel is otherwise negative. The appendix is identified and is negative.  There no aggressive appearing osseous lesions.  No abdominal wall nor inguinal hernia appreciated.  IMPRESSION: Indeterminate left adrenal nodule and indeterminate nodules within the upper pole left kidney. Further characterization of these findings with MRI is recommended.  Diverticulosis  Pulmonary nodules within the right lung base. Atelectasis versus scarring within the lung bases and trace effusions.   Electronically Signed   By: Margaree Mackintosh M.D.   On: 10/18/2013 02:47   Dg Elbow Complete Left  10/16/2013   CLINICAL DATA:  Pain  EXAM: LEFT ELBOW - COMPLETE 3+ VIEW  COMPARISON:  None.  FINDINGS: Frontal, lateral, and bilateral oblique views were obtained. There is no fracture, dislocation, or effusion. Joint spaces appear intact. No erosive change.  IMPRESSION: No fracture or effusion.  No appreciable arthropathic change.   Electronically Signed   By: Lowella Grip M.D.   On: 10/16/2013 13:42   Dg Forearm Left  10/16/2013   CLINICAL DATA:  Pain  EXAM: LEFT FOREARM - 2 VIEW  COMPARISON:  None.  FINDINGS: Frontal and lateral views were obtained. No fracture or dislocation. Joint spaces appear intact. No abnormal periosteal reaction.  IMPRESSION: No abnormality noted.   Electronically Signed   By: Lowella Grip M.D.   On: 10/16/2013 13:41   Ct Head Wo Contrast  10/16/2013   CLINICAL DATA:  Altered mental status  EXAM: CT HEAD WITHOUT CONTRAST  TECHNIQUE: Contiguous  axial images were obtained from the base of the skull through the vertex without intravenous contrast.  COMPARISON:  May 15, 2013  FINDINGS: Moderate diffuse atrophy is stable. There is no mass, hemorrhage, extra-axial fluid collection, or midline shift. There is patchy small vessel disease throughout the centra semiovale bilaterally. Elsewhere gray-white compartments appear normal. There is no demonstrable acute infarct. Bony calvarium appears intact. Mastoid air cells are clear.  IMPRESSION: Atrophy with supratentorial small vessel disease, stable. No intracranial mass or hemorrhage seen. No acute infarct apparent.   Electronically Signed   By: Lowella Grip M.D.   On: 10/16/2013 09:41   Ct Chest Wo Contrast  10/16/2013   CLINICAL DATA:  Pulmonary nodule  EXAM: CT CHEST WITHOUT CONTRAST  TECHNIQUE: Multidetector CT imaging of the chest was performed following the standard protocol without IV contrast.  COMPARISON:  Chest radiograph Oct 16, 2013  FINDINGS: There is a nodular opacity in the posterior segment of the left upper lobe measuring 1.6 x 1.4 cm. There is a second nodular opacity just inferior to the larger nodular opacity in the posterior segment of the left upper lobe measuring 0.7 x 0.8 cm. There is a nodular lesion in the superior segment of the left lower lobe measuring 1.3 x 1.2 cm. There is a nodular opacity in the medial segment of the right middle lobe measuring 1.5 x 1.2 cm. There is a nodular lesion in the superior segment of the right lower lobe measuring 1.1 x 1.0 cm. There is a nodular lesion in the posterior segment of the right lower lobe measuring 1.4 by 0.8 cm. There is a nodular lesion in the medial right base measuring 2.0 x 1.3 cm. There is a nodular lesion in the posterior right base measuring 1.6 x  1.3 cm. There is bibasilar atelectasis.  Heart is enlarged. There is a small pericardial effusion. There is extensive coronary artery calcification.  There are a few  subcentimeter mediastinal lymph nodes, by size criteria there is no adenopathy.  In the visualized upper abdomen, there is a left adrenal mass which has attenuation values higher than is expected for adenoma measuring 3.3 x 3.4 cm. There is a non cystic mass arising from the upper pole the left kidney measuring 1.4 x 1.0 cm. Visualized upper abdominal structures otherwise appear unremarkable on this noncontrast enhanced study.  Thyroid appears normal. There is degenerative change in the thoracic spine. There are no blastic or lytic bone lesions. There is evidence of old rib 1 trauma on the left with healing.  IMPRESSION: Multiple noncalcified parenchymal lung nodular lesions suspicious for metastatic foci. There is also a left adrenal mass which statistically in is most likely metastatic in etiology. There is a small solid mass arising from the upper pole of the left kidney which could represent an early neoplasm. Note that it would be unusual for a mass of this small size arising from the kidney to result in the apparent metastatic lesions in the lungs and left adrenal.  There is underlying atelectatic change in the bases. Heart is enlarged with small pericardial effusion. There is extensive coronary artery calcification.  There is no demonstrable adenopathy by size criteria on this study.   Electronically Signed   By: Lowella Grip M.D.   On: 10/16/2013 09:49   Mr Brain Wo Contrast  10/17/2013   CLINICAL DATA:  Altered mental status and worsening weakness.  EXAM: MRI HEAD WITHOUT CONTRAST  TECHNIQUE: Multiplanar, multiecho pulse sequences of the brain and surrounding structures were obtained without intravenous contrast.  COMPARISON:  Head CT 10/16/2013  FINDINGS: Coronal T2 weighted images were inadvertently not obtained.  There is no acute infarct. Small foci of remote microhemorrhage are present the inferior right cerebellum and right temporal lobe. Confluent T2 hyperintensities are present in the  subcortical and deep cerebral white matter with patchy T2 hyperintensity noted in the pons, nonspecific but compatible with advanced chronic small vessel ischemic disease. There is mild generalized cerebral atrophy. There is no evidence of mass, midline shift, or extra-axial fluid collection.  Orbits are unremarkable paranasal sinuses and mastoid air cells are clear. Major intracranial vascular flow voids are unremarkable.  IMPRESSION: 1. No evidence of acute intracranial abnormality. 2. Advanced chronic small vessel ischemic disease.   Electronically Signed   By: Logan Bores   On: 10/17/2013 08:47   Dg Chest Port 1 View  10/16/2013   CLINICAL DATA:  Weakness and hypertension  EXAM: PORTABLE CHEST - 1 VIEW  COMPARISON:  May 15, 2013 and January 02, 2012  FINDINGS: There is a 1.1 x 1.0 cm nodular opacity in the right mid lung. There is no edema or consolidation. Heart is enlarged with normal pulmonary vascularity. No adenopathy. There is atherosclerotic change in the aorta.  IMPRESSION: 1.1 x 1.0 cm nodular passed in the right mid lung. Advise correlation with noncontrast enhanced chest CT to further assess. No edema or consolidation. Stable cardiomegaly.   Electronically Signed   By: Lowella Grip M.D.   On: 10/16/2013 08:54    Microbiology: Recent Results (from the past 240 hour(s))  URINE CULTURE     Status: None   Collection Time    10/16/13  2:17 PM      Result Value Ref Range Status   Specimen Description  URINE, CATHETERIZED   Final   Special Requests NONE   Final   Culture  Setup Time     Final   Value: 10/16/2013 22:53     Performed at [place]     Final   Value: >=100,000 COLONIES/ML     Performed at [facility]   Culture     Final   Value: ESCHERICHIA COLI     Performed at [facility]   Report Status 10/18/2013 FINAL   Final   Organism ID, Bacteria ESCHERICHIA COLI   Final     Labs: Basic Metabolic Panel:  Recent Labs Lab  10/16/13 0830 10/16/13 0855 10/17/13 0500  NA 149* 149* 146  K 2.9* 2.7* 3.0*  CL 108 107 107  CO2 29  --  27  GLUCOSE 48* 52* 251*  BUN 13 11 12   CREATININE 1.15* 1.30* 0.97  CALCIUM 9.4  --  8.9   Liver Function Tests:  Recent Labs Lab 10/16/13 0830  AST 9  ALT 8  ALKPHOS 81  BILITOT 0.4  PROT 6.6  ALBUMIN 2.5*   CBC:  Recent Labs Lab 10/16/13 0830 10/16/13 0855 10/17/13 0500  WBC 12.5*  --  13.5*  NEUTROABS 9.4*  --   --   HGB 11.7* 12.6 11.3*  HCT 36.6 37.0 35.3*  MCV 89.1  --  88.5  PLT 340  --  332   BNP (last 3 results)  Recent Labs  10/16/13 0830  PROBNP 570.2*   CBG:  Recent Labs Lab 10/19/13 1649 10/19/13 2123 10/20/13 0302 10/20/13 0611 10/20/13 1119  GLUCAP 215* 322* 274* 242* 230*    Signed:  Barton Dubois  [facility] 10/20/2013, 11:56 AM

## 2013-10-20 NOTE — Progress Notes (Signed)
UR completed Deeanna Beightol K. Ameen Mostafa, RN, BSN, MSHL, CCM  10/20/2013 1:00 PM

## 2013-10-20 NOTE — Progress Notes (Addendum)
1600 transferred in to [facility] via [place] triad  transport

## 2013-10-21 NOTE — Clinical Social Work Psychosocial (Addendum)
Clinical Social Work Department BRIEF PSYCHOSOCIAL ASSESSMENT 10/20/2013  Patient:  Katie Dawson, Katie Dawson     Account Number:  [id number]     Admit date:  10/16/2013  Clinical Social Worker:  Iona Coach  Date/Time:  10/20/2013 10:30 AM  Referred by:  Physician  Date Referred:  10/20/2013 Referred for  SNF Placement   Other Referral:   Interview type:  Other - See comment Other interview type:   Patient and daughter Katie Dawson; cousin Katie Dawson    PSYCHOSOCIAL DATA Living Status:  WITH ADULT CHILDREN Admitted from facility:   Level of care:   Primary support name:  Katie Dawson  [phone] Primary support relationship to patient:  CHILD, ADULT Degree of support available:   Strong support    CURRENT CONCERNS Current Concerns  Post-Acute Placement   Other Concerns:    SOCIAL WORK ASSESSMENT / PLAN 80 year old female referred to [facility] today for short term SNF placement. Prior to today- Physical Therapy recommended that patient return home with home health to care of her daughter but due to slow progress- daughter felt that she could not manage her mother's care at home at this time and PT now recommends SNF placement.  Per MD- patient is medically stable for d/c. CSW met with patient, daughter and cousin this a.m. Discussed bed search process and they agreed to bed search.  Patient has been a former resident of several SNF's in the past and was able to verbalize preferences.  Active bed search initiated; Fl2 completed for MD's signature.   Assessment/plan status:  Psychosocial Support/Ongoing Assessment of Needs Other assessment/ plan:   Information/referral to community resources:   SNF bed list provided to patient/daughter    PATIENT'S/FAMILY'S RESPONSE TO PLAN OF CARE: Patient is alert and oriented; very responsive to the plan of care. She was noted to have some short term memory loss. She remembers being in other SNF's in the past and is familiar with the  placement process. She stated that she feels that she would benefit from further rehab before she returns home. Her daughter Katie Dawson is also in agreement and consulted with her sister multiple times to discuss bed offers and preference.  Plan d/c today once bed choice has been determined.

## 2013-10-21 NOTE — Clinical Social Work Placement (Addendum)
    Clinical Social Work Department CLINICAL SOCIAL WORK PLACEMENT NOTE 10/20/2013  Patient:  SHADAYA, MARSCHNER  Account Number:  [id number] Admit date:  10/16/2013  Clinical Social Worker:  Butch Penny Saba Neuman, LCSWA  Date/time:  10/20/2013 10:45 AM  Clinical Social Work is seeking post-discharge placement for this patient at the following level of care:   SKILLED NURSING   (*CSW will update this form in Epic as items are completed)   10/20/2013  Patient/family provided with [facility] Department of Clinical Social Work's list of facilities offering this level of care within the geographic area requested by the patient (or if unable, by the patient's family).  10/20/2013  Patient/family informed of their freedom to choose among providers that offer the needed level of care, that participate in Medicare, Medicaid or managed care program needed by the patient, have an available bed and are willing to accept the patient.  10/20/2013  Patient/family informed of [facility]' ownership interest in [facility], as well as of the fact that they are under no obligation to receive care at this facility.  PASARR submitted to EDS on  PASARR number received from [facility] on   FL2 transmitted to all facilities in geographic area requested by pt/family on  10/20/2013 FL2 transmitted to all facilities within larger geographic area on   Patient informed that his/her managed care company has contracts with or will negotiate with  certain facilities, including the following:   [facility]- Medicare Complete     Patient/family informed of bed offers received:  10/20/2013 Patient chooses bed at [facility] Physician recommends and patient chooses bed at    Patient to be transferred to [facility] on  10/20/2013 Patient to be transferred to facility by Ambulance Corey Harold)  The following physician request were entered in Epic:   Additional Comments: 10/20/13  Bed offers  provided after preferences given by daughter. Bed offer from [facility] accepted; patient is a former resident. Daughter was agreeable to d/c plan as was patient with no concerns or questions verbalized. Nursing notified to call report. CSW signing off.

## 2013-10-25 ENCOUNTER — Telehealth: Payer: Self-pay | Admitting: Internal Medicine

## 2013-10-25 NOTE — Telephone Encounter (Signed)
[facility] is calling to notify Dr. Jenny Reichmann that the patient was released on 5.21.15. No call back requested.

## 2013-10-27 ENCOUNTER — Other Ambulatory Visit: Payer: Self-pay | Admitting: *Deleted

## 2013-10-27 ENCOUNTER — Telehealth: Payer: Self-pay | Admitting: *Deleted

## 2013-10-27 DIAGNOSIS — N2889 Other specified disorders of kidney and ureter: Secondary | ICD-10-CM

## 2013-10-27 DIAGNOSIS — R911 Solitary pulmonary nodule: Secondary | ICD-10-CM

## 2013-10-27 NOTE — Telephone Encounter (Signed)
Spoke with pt's daughter, appt given for [facility] PET 11/09/13 at 1045. Instructions given: No food or sugar for six hours prior to exam. Schedulers will call with office visit appt. She voiced understanding.

## 2013-10-28 ENCOUNTER — Telehealth: Payer: Self-pay | Admitting: *Deleted

## 2013-10-28 NOTE — Telephone Encounter (Signed)
POF sent to scheduler 

## 2013-10-31 ENCOUNTER — Telehealth: Payer: Self-pay | Admitting: Oncology

## 2013-10-31 NOTE — Telephone Encounter (Signed)
no answer....mailed pt appt sched and letter °

## 2013-11-02 DIAGNOSIS — Z0279 Encounter for issue of other medical certificate: Secondary | ICD-10-CM

## 2013-11-07 ENCOUNTER — Telehealth: Payer: Self-pay | Admitting: Oncology

## 2013-11-07 NOTE — Telephone Encounter (Signed)
called pt,left message regarding appt for 6/10 MD moved appt due to PAL

## 2013-11-09 ENCOUNTER — Ambulatory Visit (HOSPITAL_BASED_OUTPATIENT_CLINIC_OR_DEPARTMENT_OTHER): Payer: PRIVATE HEALTH INSURANCE | Admitting: Oncology

## 2013-11-09 ENCOUNTER — Telehealth: Payer: Self-pay | Admitting: Oncology

## 2013-11-09 ENCOUNTER — Ambulatory Visit (HOSPITAL_COMMUNITY)
Admission: RE | Admit: 2013-11-09 | Discharge: 2013-11-09 | Disposition: A | Discharge status: Home / Self Care | Payer: PRIVATE HEALTH INSURANCE | Source: Ambulatory Visit | Attending: Oncology | Admitting: Oncology

## 2013-11-09 ENCOUNTER — Encounter (HOSPITAL_COMMUNITY): Payer: Self-pay

## 2013-11-09 ENCOUNTER — Other Ambulatory Visit: Payer: Self-pay | Admitting: *Deleted

## 2013-11-09 VITALS — BP 184/61 | HR 64 | Temp 97.9°F | Resp 18 | Ht 68.0 in | Wt 255.3 lb

## 2013-11-09 DIAGNOSIS — N2889 Other specified disorders of kidney and ureter: Secondary | ICD-10-CM

## 2013-11-09 DIAGNOSIS — Z905 Acquired absence of kidney: Secondary | ICD-10-CM

## 2013-11-09 DIAGNOSIS — R9389 Abnormal findings on diagnostic imaging of other specified body structures: Secondary | ICD-10-CM

## 2013-11-09 DIAGNOSIS — N289 Disorder of kidney and ureter, unspecified: Secondary | ICD-10-CM | POA: Insufficient documentation

## 2013-11-09 DIAGNOSIS — R911 Solitary pulmonary nodule: Secondary | ICD-10-CM

## 2013-11-09 DIAGNOSIS — R918 Other nonspecific abnormal finding of lung field: Secondary | ICD-10-CM | POA: Insufficient documentation

## 2013-11-09 DIAGNOSIS — E278 Other specified disorders of adrenal gland: Secondary | ICD-10-CM | POA: Insufficient documentation

## 2013-11-09 LAB — GLUCOSE, CAPILLARY: Glucose-Capillary: 262 mg/dL — ABNORMAL HIGH (ref 70–99)

## 2013-11-09 MED ORDER — FLUDEOXYGLUCOSE F - 18 (FDG) INJECTION
13.9000 | Freq: Once | INTRAVENOUS | Status: AC | PRN
  Administered 2013-11-09: 13.9 via INTRAVENOUS

## 2013-11-09 NOTE — Telephone Encounter (Signed)
Gave pt appt for Md only 6/26

## 2013-11-09 NOTE — Progress Notes (Signed)
  [facility] OFFICE PROGRESS NOTE   Diagnosis: Metastatic carcinoma  INTERVAL HISTORY:   I saw Katie Dawson when she was hospitalized at [facility] last month. She was admitted with encephalopathy felt to be related to urinary tract infection. She was found to have lung nodules, a left adrenal mass, and a left renal mass on a CT. She has a remote history of a right nephrectomy (we do not have records available from this procedure). She was discharged on 10/20/2013 to a nursing facility. She reports a good appetite. She has no specific complaint. She plans to return home or to an assisted living facility within the next few days.  Objective:  Vital signs in last 24 hours:  Blood pressure 184/61, pulse 64, temperature 97.9 F (36.6 C), temperature source Oral, resp. rate 18, height 5\' 8"  (1.727 m), weight 255 lb 4.8 oz (115.803 kg).    HEENT: Neck without mass Lymphatics: No cervical, supraclavicular, axillary, or inguinal nodes Resp: Lungs clear bilaterally Cardio: Regular rate and rhythm GI: No hepatomegaly, nontender Vascular: No leg edema Neuro: Alert, follows commands    Imaging: PET scan 11/09/2013-I reviewed the CT and PET images with Katie Dawson and her family. The left adrenal mass appears hypermetabolic. There may be hypermetabolic bone lesions. I cannot appreciate increased FDG activity associated with the lung lesions.   Medications: I have reviewed the patient's current medications.  Assessment/Plan: 1. Bilateral lung masses on a chest CT 10/16/2013, left adrenal nodule and left renal nodules on a CT 10/18/2013  2. history of a right nephrectomy-? For treatment of a malignancy  4. History of dementia  5. Diabetes  6. Urinary tract infection May 2015 7. Anemia     Disposition:  She appears to have bilateral lung metastases. The official reading from the 11/09/2013 PET scan is pending. I will refer her to interventional radiology for a biopsy of either a lung  lesion, the adrenal mass, or a bone lesion. She will return for further discussion after the biopsy procedure. We will attempt to obtain records regarding the right nephrectomy procedure.  She has dementia and multiple comorbid conditions. She may not be a candidate for systemic therapy regardless of the biopsy findings. I discussed this with her family. They would like to proceed with a diagnostic biopsy.  Betsy Coder, MD  11/09/2013  1:12 PM

## 2013-11-09 NOTE — Progress Notes (Signed)
PET was not performed today, pt refused due to claustrophobia. During visit, pt agreed to scan. Called nuclear med, pt will be worked back in for PET today if MD approves. Pt sent with daughters via wheelchair.

## 2013-11-10 ENCOUNTER — Telehealth: Payer: Self-pay | Admitting: *Deleted

## 2013-11-10 NOTE — Telephone Encounter (Signed)
Spoke with daughter regarding PET results and MD recommendations to observe and rescan in 2 months. Will present case to GI conference on 6/24 and then review at her 6/26 visit. Daughter understands and agrees and was appreciative for the call.

## 2013-11-10 NOTE — Telephone Encounter (Signed)
Left VM for daughter to call office back regarding results. Spoke with Maudie Mercury at [facility] Pathology department and had case added to 11/23/13 GI tumor conference.

## 2013-11-10 NOTE — Telephone Encounter (Signed)
Message copied by Tania Ade on Thu Nov 10, 2013  1:04 PM ------      Message from: Betsy Coder B      Created: Wed Nov 09, 2013  5:56 PM       Please call daughter, only area of hypermetabolism on PET is the left adrenal lesion.bones, lung lesions, and left renal mass or negative on PET. I am still suspicious the lung lesions are malignant.            I recommend observation. We will repeat a CT of the chest in approximately 2 months and decide on a biopsy. We are trying to get records from the right nephrectomy.            Add her case to the GI tumor conference 11/23/2013            Thanks ------

## 2013-11-11 ENCOUNTER — Ambulatory Visit: Payer: PRIVATE HEALTH INSURANCE | Admitting: Oncology

## 2013-11-17 ENCOUNTER — Telehealth: Payer: Self-pay | Admitting: *Deleted

## 2013-11-17 NOTE — Telephone Encounter (Signed)
Left message to call Dr Gearldine Shown office

## 2013-11-24 ENCOUNTER — Telehealth: Payer: Self-pay | Admitting: *Deleted

## 2013-11-24 NOTE — Telephone Encounter (Signed)
Close encounter 

## 2013-11-25 ENCOUNTER — Ambulatory Visit: Payer: PRIVATE HEALTH INSURANCE | Admitting: Oncology

## 2013-12-02 ENCOUNTER — Emergency Department (HOSPITAL_COMMUNITY): Payer: PRIVATE HEALTH INSURANCE

## 2013-12-02 ENCOUNTER — Emergency Department (HOSPITAL_COMMUNITY)
Admission: EM | Admit: 2013-12-02 | Discharge: 2013-12-02 | Disposition: A | Discharge status: Home / Self Care | Payer: PRIVATE HEALTH INSURANCE | Attending: Emergency Medicine | Admitting: Emergency Medicine

## 2013-12-02 ENCOUNTER — Encounter (HOSPITAL_COMMUNITY): Payer: Self-pay | Admitting: Emergency Medicine

## 2013-12-02 DIAGNOSIS — Z8781 Personal history of (healed) traumatic fracture: Secondary | ICD-10-CM | POA: Insufficient documentation

## 2013-12-02 DIAGNOSIS — I10 Essential (primary) hypertension: Secondary | ICD-10-CM | POA: Insufficient documentation

## 2013-12-02 DIAGNOSIS — M1710 Unilateral primary osteoarthritis, unspecified knee: Secondary | ICD-10-CM | POA: Insufficient documentation

## 2013-12-02 DIAGNOSIS — Z8719 Personal history of other diseases of the digestive system: Secondary | ICD-10-CM | POA: Insufficient documentation

## 2013-12-02 DIAGNOSIS — F411 Generalized anxiety disorder: Secondary | ICD-10-CM | POA: Insufficient documentation

## 2013-12-02 DIAGNOSIS — Z905 Acquired absence of kidney: Secondary | ICD-10-CM | POA: Insufficient documentation

## 2013-12-02 DIAGNOSIS — Z794 Long term (current) use of insulin: Secondary | ICD-10-CM | POA: Insufficient documentation

## 2013-12-02 DIAGNOSIS — R4182 Altered mental status, unspecified: Secondary | ICD-10-CM | POA: Insufficient documentation

## 2013-12-02 DIAGNOSIS — Z862 Personal history of diseases of the blood and blood-forming organs and certain disorders involving the immune mechanism: Secondary | ICD-10-CM | POA: Insufficient documentation

## 2013-12-02 DIAGNOSIS — Z87440 Personal history of urinary (tract) infections: Secondary | ICD-10-CM | POA: Insufficient documentation

## 2013-12-02 DIAGNOSIS — Z860100 Personal history of colon polyps, unspecified: Secondary | ICD-10-CM | POA: Insufficient documentation

## 2013-12-02 DIAGNOSIS — E785 Hyperlipidemia, unspecified: Secondary | ICD-10-CM | POA: Insufficient documentation

## 2013-12-02 DIAGNOSIS — E119 Type 2 diabetes mellitus without complications: Secondary | ICD-10-CM | POA: Insufficient documentation

## 2013-12-02 DIAGNOSIS — N3000 Acute cystitis without hematuria: Secondary | ICD-10-CM | POA: Insufficient documentation

## 2013-12-02 DIAGNOSIS — IMO0002 Reserved for concepts with insufficient information to code with codable children: Secondary | ICD-10-CM | POA: Insufficient documentation

## 2013-12-02 DIAGNOSIS — Z79899 Other long term (current) drug therapy: Secondary | ICD-10-CM | POA: Insufficient documentation

## 2013-12-02 LAB — CBC
HCT: 40.5 % (ref 36.0–46.0)
Hemoglobin: 13 g/dL (ref 12.0–15.0)
MCH: 28.6 pg (ref 26.0–34.0)
MCHC: 32.1 g/dL (ref 30.0–36.0)
MCV: 89 fL (ref 78.0–100.0)
PLATELETS: 316 10*3/uL (ref 150–400)
RBC: 4.55 MIL/uL (ref 3.87–5.11)
RDW: 15.6 % — ABNORMAL HIGH (ref 11.5–15.5)
WBC: 11.1 10*3/uL — ABNORMAL HIGH (ref 4.0–10.5)

## 2013-12-02 LAB — URINALYSIS, ROUTINE W REFLEX MICROSCOPIC
Bilirubin Urine: NEGATIVE
GLUCOSE, UA: NEGATIVE mg/dL
Ketones, ur: NEGATIVE mg/dL
Nitrite: NEGATIVE
SPECIFIC GRAVITY, URINE: 1.012 (ref 1.005–1.030)
Urobilinogen, UA: 0.2 mg/dL (ref 0.0–1.0)
pH: 6.5 (ref 5.0–8.0)

## 2013-12-02 LAB — COMPREHENSIVE METABOLIC PANEL
ALT: 11 U/L (ref 0–35)
AST: 21 U/L (ref 0–37)
Albumin: 2.8 g/dL — ABNORMAL LOW (ref 3.5–5.2)
Alkaline Phosphatase: 79 U/L (ref 39–117)
Anion gap: 11 (ref 5–15)
BUN: 12 mg/dL (ref 6–23)
CALCIUM: 9.8 mg/dL (ref 8.4–10.5)
CO2: 30 mEq/L (ref 19–32)
Chloride: 106 mEq/L (ref 96–112)
Creatinine, Ser: 0.88 mg/dL (ref 0.50–1.10)
GFR calc Af Amer: 72 mL/min — ABNORMAL LOW (ref >90)
GFR, EST NON AFRICAN AMERICAN: 62 mL/min — AB (ref >90)
Glucose, Bld: 87 mg/dL (ref 70–99)
Potassium: 4.1 mEq/L (ref 3.7–5.3)
Sodium: 147 mEq/L (ref 137–147)
TOTAL PROTEIN: 7 g/dL (ref 6.0–8.3)
Total Bilirubin: 0.3 mg/dL (ref 0.3–1.2)

## 2013-12-02 LAB — URINE MICROSCOPIC-ADD ON

## 2013-12-02 MED ORDER — CEPHALEXIN 500 MG PO CAPS: 500.0000 mg | ORAL_CAPSULE | Freq: Four times a day (QID) | ORAL | Status: DC

## 2013-12-02 MED ORDER — DEXTROSE 5 % IV SOLN
1.0000 g | Freq: Once | INTRAVENOUS | Status: AC
  Administered 2013-12-02: 1 g via INTRAVENOUS
  Filled 2013-12-02: qty 10

## 2013-12-02 NOTE — ED Notes (Signed)
Per EMS pt coming from home with c/o altered mental status starting at 11:00 this morning started as mild confusion and progressed rapidly. EMS however sts upon their arrival pt was alert and oriented, somewhat repetitive, denies any complaints. Per EMS family reports pt was recently diagnosed with dementia.

## 2013-12-02 NOTE — Discharge Instructions (Signed)
Return to the ED with any concerns including fever/chills, vomiting and not able to keep down liquids or antibiotics, difficulty breathing, decreased level of alertness/lethargy, or any other alarming symptoms

## 2013-12-02 NOTE — ED Provider Notes (Signed)
CSN: [id number]     Arrival date & time 12/02/13  1421 History   First MD Initiated Contact with Patient 12/02/13 1501     Chief Complaint  Patient presents with  . Altered Mental Status     (Consider location/radiation/quality/duration/timing/severity/associated sxs/prior Treatment) HPI Pt presents with concern for altered mental status.  Pt lives at home and has been diagnosed with dementia.  Per EMS patient was mildly confused this morning and remained so throughout the day.  She was recently hospitalized for UTI.  Pt has no current complaints.  She thinks she may have had a fever several days ago but is unsure.  No vomiting or diarrhea  Per EMS pt has been alert and oriented for them.  There are no other associated systemic symptoms, there are no other alleviating or modifying factors.   Past Medical History  Diagnosis Date  . DIABETES MELLITUS, TYPE II 04/22/2007  . HYPERLIPIDEMIA 04/22/2007  . GOUT 04/22/2007  . ANEMIA-IRON DEFICIENCY 04/22/2007  . ANXIETY 04/22/2007  . OBSTRUCTIVE SLEEP APNEA 04/22/2007  . HYPERTENSION 04/22/2007  . GERD 04/22/2007  . RENAL INSUFFICIENCY 04/22/2007  . 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 FOOT PAIN, LEFT 12/23/2007  . OSTEOPENIA 02/12/2009  . INTERMITTENT VERTIGO 04/20/2009  . HYPERSOMNIA 01/11/2009  . COLONIC POLYPS, HX OF 04/22/2007  . NEPHRECTOMY, HX OF 04/22/2007  . Urinary tract infection     hx of  . Right fibular fracture July 2013   Past Surgical History  Procedure Laterality Date  . S/p right nephrectomy    . S/p parathyroid surgury    . Orif wrist fracture  01/02/2012    Procedure: OPEN REDUCTION INTERNAL FIXATION (ORIF) WRIST FRACTURE;  Surgeon: Schuyler Amor, MD;  Location: [facility];  Service: Orthopedics;  Laterality: Right;  Open Reduction Internal Fixation Right Distal Radius    Family History  Problem Relation Age of Onset  . Heart disease Mother    History  Substance Use Topics  . Smoking status: Never  Smoker   . Smokeless tobacco: Never Used  . Alcohol Use: No   OB History   Grav Para Term Preterm Abortions TAB SAB Ect Mult Living                 Review of Systems ROS reviewed and all otherwise negative except for mentioned in HPI    Allergies  Review of patient's allergies indicates no known allergies.  Home Medications   Prior to Admission medications   Medication Sig Start Date End Date Taking? Authorizing Provider  amLODipine (NORVASC) 10 MG tablet Take 10 mg by mouth daily.   Yes Historical Provider, MD  BD PEN NEEDLE NANO U/F 32G X 4 MM MISC USE WITH LANTUS DAILY.   Yes Biagio Borg, MD  busPIRone (BUSPAR) 7.5 MG tablet Take 1 tablet (7.5 mg total) by mouth every 8 (eight) hours as needed (nerves). 10/20/13  Yes Barton Dubois, MD  citalopram (CELEXA) 10 MG tablet Take 10 mg by mouth daily.   Yes Historical Provider, MD  cloNIDine (CATAPRES) 0.1 MG tablet Take 0.1 mg by mouth 2 (two) times daily.   Yes Historical Provider, MD  donepezil (ARICEPT) 10 MG tablet Take 1 tablet (10 mg total) by mouth at bedtime. 09/16/13  Yes Biagio Borg, MD  gabapentin (NEURONTIN) 100 MG capsule Take 1 capsule (100 mg total) by mouth 3 (three) times daily. 05/18/13  Yes Maryann Mikhail, DO  glipiZIDE (GLUCOTROL XL) 10 MG 24 hr tablet Take 20  mg by mouth daily with breakfast.   Yes Historical Provider, MD  glucose blood (BAYER CONTOUR NEXT TEST) test strip Use as instructed 09/16/13  Yes Biagio Borg, MD  insulin glargine (LANTUS) 100 UNIT/ML injection Inject 0.6 mLs (60 Units total) into the skin at bedtime. 02/10/13 02/10/14 Yes Biagio Borg, MD  isosorbide mononitrate (IMDUR) 60 MG 24 hr tablet Take 60 mg by mouth at bedtime.   Yes Historical Provider, MD  labetalol (NORMODYNE) 200 MG tablet Take 200 mg by mouth 2 (two) times daily.   Yes Historical Provider, MD  Lancets MISC Use as directed 1 per day 09/16/13  Yes Biagio Borg, MD  pravastatin (PRAVACHOL) 40 MG tablet Take 1 tablet (40 mg total)  by mouth daily. 09/16/13  Yes Biagio Borg, MD  risperiDONE (RISPERDAL) 0.25 MG tablet 1 tab by mouth in the AM, then 4 tabs by mouth in the PM 09/16/13  Yes Biagio Borg, MD  cephALEXin (KEFLEX) 500 MG capsule Take 1 capsule (500 mg total) by mouth 4 (four) times daily. 12/02/13   Threasa Beards, MD   BP 200/84  Pulse 76  Temp(Src) 98.3 F (36.8 C) (Oral)  Resp 18  SpO2 96% Vitals reviewed Physical Exam Physical Examination: General appearance - alert, well appearing, and in no distress Mental status - alert, oriented to person, place, not to time Eyes - pupils equal and reactive, extraocular eye movements intact Mouth - mucous membranes moist, pharynx normal without lesions Chest - clear to auscultation, no wheezes, rales or rhonchi, symmetric air entry Heart - normal rate, regular rhythm, normal S1, S2, no murmurs, rubs, clicks or gallops Abdomen - soft, nontender, nondistended, no masses or organomegaly Neurological - alert, oriented, normal speech, cranial nerves 2-12 tested and intact, strength 5/5 in extremities x 4, sensation intact Extremities - peripheral pulses normal, no pedal edema, no clubbing or cyanosis Skin - normal coloration and turgor, no rashes  ED Course  Procedures (including critical care time) Labs Review Labs Reviewed  CBC - Abnormal; Notable for the following:    WBC 11.1 (*)    RDW 15.6 (*)    All other components within normal limits  COMPREHENSIVE METABOLIC PANEL - Abnormal; Notable for the following:    Albumin 2.8 (*)    GFR calc non Af Amer 62 (*)    GFR calc Af Amer 72 (*)    All other components within normal limits  URINALYSIS, ROUTINE W REFLEX MICROSCOPIC - Abnormal; Notable for the following:    APPearance CLOUDY (*)    Hgb urine dipstick TRACE (*)    Protein, ur >300 (*)    Leukocytes, UA SMALL (*)    All other components within normal limits  URINE MICROSCOPIC-ADD ON - Abnormal; Notable for the following:    Bacteria, UA FEW (*)    All  other components within normal limits  URINE CULTURE    Imaging Review Ct Head Wo Contrast  12/02/2013   CLINICAL DATA:  Dizziness.  Altered mental status.  EXAM: CT HEAD WITHOUT CONTRAST  TECHNIQUE: Contiguous axial images were obtained from the base of the skull through the vertex without intravenous contrast.  COMPARISON:  10/16/2013.  FINDINGS: Diffusely enlarged ventricles and subarachnoid spaces. Patchy white matter low density in both cerebral hemispheres. No intracranial hemorrhage, mass lesion or CT evidence of acute infarction. Unremarkable bones and paranasal sinuses.  IMPRESSION: Stable atrophy and chronic small vessel white matter ischemic changes. No acute abnormality.   Electronically  Signed   By: Enrique Sack M.D.   On: 12/02/2013 16:13     EKG Interpretation None      MDM   Final diagnoses:  Acute cystitis without hematuria  Altered mental status, unspecified altered mental status type    Pt presenting with c/o confusion today while at the nursing home.  Pt is awake and alert with normal neuro exam in the ED.  Head CT shows chronic changes, labs reassuring.  UA c/w UTI- urine culture pending.  Pt started on rocephin and will be discharged back to nursing facility to complete course of antibiotics.  Discharged with strict return precautions.  Pt agreeable with plan.    Threasa Beards, MD 12/02/13 2202

## 2013-12-02 NOTE — ED Notes (Addendum)
Dr Canary Brim aware of pt's elevated BP, states it is ok to discharge pt to home and advise family members to give pt BP prescribed BP medication when pt gets home. Family called for transportation home.

## 2013-12-02 NOTE — ED Notes (Signed)
Pt out of room for imaging at this time

## 2013-12-02 NOTE — ED Notes (Signed)
Bed: JR93 Expected date:  Expected time:  Means of arrival:  Comments: EMS- [facility]

## 2013-12-04 LAB — URINE CULTURE: Colony Count: 9000

## 2014-01-06 ENCOUNTER — Other Ambulatory Visit: Payer: Self-pay | Admitting: Internal Medicine

## 2014-01-06 ENCOUNTER — Other Ambulatory Visit: Payer: Self-pay

## 2014-01-06 NOTE — Telephone Encounter (Signed)
Faxed Buspar to [facility]

## 2014-01-29 ENCOUNTER — Other Ambulatory Visit: Payer: Self-pay | Admitting: Internal Medicine

## 2014-01-30 ENCOUNTER — Other Ambulatory Visit: Payer: Self-pay | Admitting: Internal Medicine

## 2014-01-30 NOTE — Telephone Encounter (Signed)
Done erx 

## 2014-02-03 NOTE — Telephone Encounter (Signed)
[place] paramedic called in and said that they came out do to sugar very low she is going do ok now but has a uti and wanted to know if Dr Jenny Reichmann could call in something for her uti without being seen.     Best number [phone].

## 2014-02-03 NOTE — Telephone Encounter (Signed)
Caregiver informed of MD's response for request for something sent in for UTI.  Caregiver requested to schedule appt. For next week. Transferred to the front to schedule appointment for the patient.

## 2014-02-07 ENCOUNTER — Ambulatory Visit (INDEPENDENT_AMBULATORY_CARE_PROVIDER_SITE_OTHER): Payer: PRIVATE HEALTH INSURANCE | Admitting: Internal Medicine

## 2014-02-07 ENCOUNTER — Encounter: Payer: Self-pay | Admitting: Internal Medicine

## 2014-02-07 VITALS — BP 128/80 | HR 63 | Temp 97.8°F | Wt 247.0 lb

## 2014-02-07 DIAGNOSIS — Z23 Encounter for immunization: Secondary | ICD-10-CM

## 2014-02-07 DIAGNOSIS — I10 Essential (primary) hypertension: Secondary | ICD-10-CM

## 2014-02-07 DIAGNOSIS — R8290 Unspecified abnormal findings in urine: Secondary | ICD-10-CM

## 2014-02-07 DIAGNOSIS — E119 Type 2 diabetes mellitus without complications: Secondary | ICD-10-CM

## 2014-02-07 DIAGNOSIS — R82998 Other abnormal findings in urine: Secondary | ICD-10-CM

## 2014-02-07 LAB — GLUCOSE, POCT (MANUAL RESULT ENTRY): POC GLUCOSE: 239 mg/dL — AB (ref 70–99)

## 2014-02-07 MED ORDER — GLIPIZIDE ER 5 MG PO TB24: 5.0000 mg | ORAL_TABLET | Freq: Every day | ORAL | Status: DC

## 2014-02-07 MED ORDER — CEPHALEXIN 500 MG PO CAPS: 500.0000 mg | ORAL_CAPSULE | Freq: Four times a day (QID) | ORAL | Status: DC

## 2014-02-07 NOTE — Assessment & Plan Note (Signed)
unfort unable to get Urine for testing today, ok for empiric antibx,  to f/u any worsening symptoms or concerns

## 2014-02-07 NOTE — Patient Instructions (Signed)
Please take all new medication as prescribed - the antibiotic, as well as the lower dose glipizide ER 5 mg per day  OK to STOP the glipizide 10 mg twice per day  Please continue all other medications as before, and refills have been done if requested.  Please have the pharmacy call with any other refills you may need.  Please keep your appointments with your specialists as you may have planned  Please return in 6 months, or sooner if needed

## 2014-02-07 NOTE — Progress Notes (Signed)
Pre visit review using our clinic review tool, if applicable. No additional management support is needed unless otherwise documented below in the visit note. 

## 2014-02-07 NOTE — Assessment & Plan Note (Addendum)
With some recent wt loss, and one severe low sugar, ok to decrease the glipizide from 20 qd to ER 5 qd., cont lantus as rx, cont cbg's, call for > 200

## 2014-02-07 NOTE — Progress Notes (Signed)
Subjective:    Patient ID: Katie Dawson, female    DOB: [birth year], 80 y.o.   MRN: [id number]  HPI  Here to f/u; overall doing ok,  Pt denies chest pain, increased sob or doe, wheezing, orthopnea, PND, increased LE swelling, palpitations, dizziness or syncope.  Pt denies polydipsia, polyuria, but did have low sugar symptoms such as weakness or confusion with recent sugar 31, improved with po intake. BS today 239 in office. Pt denies new neurological symptoms such as new headache, or facial or extremity weakness or numbness.   Pt states overall good compliance with meds, has been trying to follow lower cholesterol, diabetic diet, with wt overall down several lbs,  but little exercise however.  Wt Readings from Last 3 Encounters:  02/07/14 247 lb (112.038 kg)  11/09/13 255 lb 4.8 oz (115.803 kg)  10/20/13 251 lb 5.2 oz (114 kg)  Also incidentally with abnormal strong urine odor with mild freq and urgency, but Denies urinary symptoms such as dysuria, flank pain, hematuria or n/v, fever, chills.   Past Medical History  Diagnosis Date  . DIABETES MELLITUS, TYPE II 04/22/2007  . HYPERLIPIDEMIA 04/22/2007  . GOUT 04/22/2007  . ANEMIA-IRON DEFICIENCY 04/22/2007  . ANXIETY 04/22/2007  . OBSTRUCTIVE SLEEP APNEA 04/22/2007  . HYPERTENSION 04/22/2007  . GERD 04/22/2007  . RENAL INSUFFICIENCY 04/22/2007  . 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 FOOT PAIN, LEFT 12/23/2007  . OSTEOPENIA 02/12/2009  . INTERMITTENT VERTIGO 04/20/2009  . HYPERSOMNIA 01/11/2009  . COLONIC POLYPS, HX OF 04/22/2007  . NEPHRECTOMY, HX OF 04/22/2007  . Urinary tract infection     hx of  . Right fibular fracture July 2013   Past Surgical History  Procedure Laterality Date  . S/p right nephrectomy    . S/p parathyroid surgury    . Orif wrist fracture  01/02/2012    Procedure: OPEN REDUCTION INTERNAL FIXATION (ORIF) WRIST FRACTURE;  Surgeon: Schuyler Amor, MD;  Location: [facility];  Service: Orthopedics;   Laterality: Right;  Open Reduction Internal Fixation Right Distal Radius     reports that she has never smoked. She has never used smokeless tobacco. She reports that she does not drink alcohol or use illicit drugs. family history includes Heart disease in her mother. No Known Allergies Current Outpatient Prescriptions on File Prior to Visit  Medication Sig Dispense Refill  . amLODipine (NORVASC) 10 MG tablet Take 10 mg by mouth daily.      . BD PEN NEEDLE NANO U/F 32G X 4 MM MISC USE WITH LANTUS DAILY.  100 each  6  . busPIRone (BUSPAR) 7.5 MG tablet TAKE 1 TABLET THREE TIMES DAILY AS NEEDED FOR NERVES.  90 tablet  2  . citalopram (CELEXA) 10 MG tablet Take 10 mg by mouth daily.      . cloNIDine (CATAPRES) 0.1 MG tablet Take 0.1 mg by mouth 2 (two) times daily.      Marland Kitchen donepezil (ARICEPT) 10 MG tablet Take 1 tablet (10 mg total) by mouth at bedtime.  90 tablet  3  . gabapentin (NEURONTIN) 100 MG capsule TAKE 1 CAPSULE THREE TIMES DAILY AS NEEDED FOR PAIN.  90 capsule  1  . glucose blood (BAYER CONTOUR NEXT TEST) test strip Use as instructed  100 each  12  . insulin glargine (LANTUS) 100 UNIT/ML injection Inject 0.6 mLs (60 Units total) into the skin at bedtime.  10 mL  11  . isosorbide mononitrate (IMDUR) 60 MG 24 hr tablet Take 60  mg by mouth at bedtime.      Marland Kitchen labetalol (NORMODYNE) 200 MG tablet Take 200 mg by mouth 2 (two) times daily.      . Lancets MISC Use as directed 1 per day  100 each  12  . pravastatin (PRAVACHOL) 40 MG tablet Take 1 tablet (40 mg total) by mouth daily.  90 tablet  3  . risperiDONE (RISPERDAL) 0.25 MG tablet 1 tab by mouth in the AM, then 4 tabs by mouth in the PM  150 tablet  5   No current facility-administered medications on file prior to visit.   Review of Systems  Constitutional: Negative for unusual diaphoresis or other sweats  HENT: Negative for ringing in ear Eyes: Negative for double vision or worsening visual disturbance.  Respiratory: Negative for  choking and stridor.   Gastrointestinal: Negative for vomiting or other signifcant bowel change Genitourinary: Negative for hematuria or decreased urine volume.  Musculoskeletal: Negative for other MSK pain or swelling Skin: Negative for color change and worsening wound.  Neurological: Negative for tremors and numbness other than noted  Psychiatric/Behavioral: Negative for decreased concentration or agitation other than above       Objective:   Physical Exam BP 128/80  Pulse 63  Temp(Src) 97.8 F (36.6 C) (Oral)  Wt 247 lb (112.038 kg)  SpO2 94% VS noted,  Constitutional: Pt appears well-developed, well-nourished.  HENT: Head: NCAT.  Right Ear: External ear normal.  Left Ear: External ear normal.  Eyes: . Pupils are equal, round, and reactive to light. Conjunctivae and EOM are normal Neck: Normal range of motion. Neck supple.  Cardiovascular: Normal rate and regular rhythm.   Pulmonary/Chest: Effort normal and breath sounds normal.  Abd:  Soft, NT, ND, + BS, no flank tender Neurological: Pt is alert. Not confused , motor grossly intact Skin: Skin is warm. No rash Psychiatric: Pt behavior is normal. No agitation.     Assessment & Plan:

## 2014-02-12 NOTE — Assessment & Plan Note (Signed)
stable overall by history and exam, recent data reviewed with pt, and pt to continue medical treatment as before,  to f/u any worsening symptoms or concerns BP Readings from Last 3 Encounters:  02/07/14 128/80  12/02/13 200/84  11/09/13 184/61

## 2014-02-26 ENCOUNTER — Other Ambulatory Visit: Payer: Self-pay | Admitting: Internal Medicine

## 2014-03-22 ENCOUNTER — Other Ambulatory Visit: Payer: Self-pay | Admitting: Internal Medicine

## 2014-03-23 ENCOUNTER — Ambulatory Visit: Payer: PRIVATE HEALTH INSURANCE | Admitting: Internal Medicine

## 2014-04-05 ENCOUNTER — Observation Stay (HOSPITAL_COMMUNITY): Payer: PRIVATE HEALTH INSURANCE

## 2014-04-05 ENCOUNTER — Encounter (HOSPITAL_COMMUNITY): Payer: Self-pay | Admitting: Emergency Medicine

## 2014-04-05 ENCOUNTER — Observation Stay (HOSPITAL_COMMUNITY)
Admission: EM | Admit: 2014-04-05 | Discharge: 2014-04-07 | Disposition: A | Discharge status: Skilled Nursing Facility | Payer: PRIVATE HEALTH INSURANCE | Attending: Internal Medicine | Admitting: Internal Medicine

## 2014-04-05 ENCOUNTER — Emergency Department (HOSPITAL_COMMUNITY): Payer: PRIVATE HEALTH INSURANCE

## 2014-04-05 DIAGNOSIS — F0390 Unspecified dementia without behavioral disturbance: Secondary | ICD-10-CM | POA: Insufficient documentation

## 2014-04-05 DIAGNOSIS — IMO0001 Reserved for inherently not codable concepts without codable children: Secondary | ICD-10-CM

## 2014-04-05 DIAGNOSIS — E785 Hyperlipidemia, unspecified: Secondary | ICD-10-CM | POA: Diagnosis not present

## 2014-04-05 DIAGNOSIS — K219 Gastro-esophageal reflux disease without esophagitis: Secondary | ICD-10-CM | POA: Diagnosis not present

## 2014-04-05 DIAGNOSIS — N289 Disorder of kidney and ureter, unspecified: Secondary | ICD-10-CM | POA: Insufficient documentation

## 2014-04-05 DIAGNOSIS — E119 Type 2 diabetes mellitus without complications: Secondary | ICD-10-CM | POA: Diagnosis not present

## 2014-04-05 DIAGNOSIS — R42 Dizziness and giddiness: Principal | ICD-10-CM | POA: Insufficient documentation

## 2014-04-05 DIAGNOSIS — Z79899 Other long term (current) drug therapy: Secondary | ICD-10-CM | POA: Insufficient documentation

## 2014-04-05 DIAGNOSIS — E876 Hypokalemia: Secondary | ICD-10-CM | POA: Diagnosis not present

## 2014-04-05 DIAGNOSIS — I10 Essential (primary) hypertension: Secondary | ICD-10-CM

## 2014-04-05 DIAGNOSIS — Z794 Long term (current) use of insulin: Secondary | ICD-10-CM | POA: Diagnosis not present

## 2014-04-05 HISTORY — DX: Unspecified dementia, unspecified severity, without behavioral disturbance, psychotic disturbance, mood disturbance, and anxiety: F03.90

## 2014-04-05 LAB — COMPREHENSIVE METABOLIC PANEL
ALT: 8 U/L (ref 0–35)
AST: 9 U/L (ref 0–37)
Albumin: 3 g/dL — ABNORMAL LOW (ref 3.5–5.2)
Alkaline Phosphatase: 71 U/L (ref 39–117)
Anion gap: 12 (ref 5–15)
BILIRUBIN TOTAL: 0.5 mg/dL (ref 0.3–1.2)
BUN: 14 mg/dL (ref 6–23)
CO2: 29 meq/L (ref 19–32)
CREATININE: 0.86 mg/dL (ref 0.50–1.10)
Calcium: 9.7 mg/dL (ref 8.4–10.5)
Chloride: 104 mEq/L (ref 96–112)
GFR calc Af Amer: 73 mL/min — ABNORMAL LOW (ref >90)
GFR calc non Af Amer: 63 mL/min — ABNORMAL LOW (ref >90)
Glucose, Bld: 176 mg/dL — ABNORMAL HIGH (ref 70–99)
POTASSIUM: 3.4 meq/L — AB (ref 3.7–5.3)
Sodium: 145 mEq/L (ref 137–147)
Total Protein: 6.9 g/dL (ref 6.0–8.3)

## 2014-04-05 LAB — RAPID URINE DRUG SCREEN, HOSP PERFORMED
AMPHETAMINES: NOT DETECTED
Barbiturates: NOT DETECTED
Benzodiazepines: NOT DETECTED
COCAINE: NOT DETECTED
OPIATES: NOT DETECTED
TETRAHYDROCANNABINOL: NOT DETECTED

## 2014-04-05 LAB — CBC WITH DIFFERENTIAL/PLATELET
BASOS ABS: 0 10*3/uL (ref 0.0–0.1)
Basophils Relative: 0 % (ref 0–1)
Eosinophils Absolute: 0 10*3/uL (ref 0.0–0.7)
Eosinophils Relative: 0 % (ref 0–5)
HEMATOCRIT: 39.1 % (ref 36.0–46.0)
HEMOGLOBIN: 12.5 g/dL (ref 12.0–15.0)
LYMPHS PCT: 9 % — AB (ref 12–46)
Lymphs Abs: 1 10*3/uL (ref 0.7–4.0)
MCH: 28.3 pg (ref 26.0–34.0)
MCHC: 32 g/dL (ref 30.0–36.0)
MCV: 88.7 fL (ref 78.0–100.0)
MONO ABS: 0.4 10*3/uL (ref 0.1–1.0)
Monocytes Relative: 4 % (ref 3–12)
NEUTROS ABS: 10 10*3/uL — AB (ref 1.7–7.7)
Neutrophils Relative %: 87 % — ABNORMAL HIGH (ref 43–77)
Platelets: 310 10*3/uL (ref 150–400)
RBC: 4.41 MIL/uL (ref 3.87–5.11)
RDW: 15.1 % (ref 11.5–15.5)
WBC: 11.5 10*3/uL — ABNORMAL HIGH (ref 4.0–10.5)

## 2014-04-05 LAB — URINE MICROSCOPIC-ADD ON

## 2014-04-05 LAB — URINALYSIS, ROUTINE W REFLEX MICROSCOPIC
Bilirubin Urine: NEGATIVE
GLUCOSE, UA: NEGATIVE mg/dL
KETONES UR: 15 mg/dL — AB
Nitrite: NEGATIVE
Protein, ur: >300 mg/dL — AB
Specific Gravity, Urine: 1.016 (ref 1.005–1.030)
Urobilinogen, UA: 1 mg/dL (ref 0.0–1.0)
pH: 6 (ref 5.0–8.0)

## 2014-04-05 LAB — CBG MONITORING, ED: Glucose-Capillary: 179 mg/dL — ABNORMAL HIGH (ref 70–99)

## 2014-04-05 LAB — TROPONIN I: Troponin I: <0.3 ng/mL (ref <0.30)

## 2014-04-05 LAB — GLUCOSE, CAPILLARY: Glucose-Capillary: 99 mg/dL (ref 70–99)

## 2014-04-05 LAB — PROTIME-INR
INR: 1.06 (ref 0.00–1.49)
Prothrombin Time: 13.9 seconds (ref 11.6–15.2)

## 2014-04-05 MED ORDER — SODIUM CHLORIDE 0.9 % IV SOLN
INTRAVENOUS | Status: DC
  Administered 2014-04-06: 05:00:00 via INTRAVENOUS

## 2014-04-05 MED ORDER — CITALOPRAM HYDROBROMIDE 10 MG PO TABS
10.0000 mg | ORAL_TABLET | Freq: Every day | ORAL | Status: DC
  Administered 2014-04-06 – 2014-04-07 (×2): 10 mg via ORAL
  Filled 2014-04-05 (×2): qty 1

## 2014-04-05 MED ORDER — ACETAMINOPHEN 325 MG PO TABS: 650.0000 mg | ORAL_TABLET | ORAL | Status: DC | PRN

## 2014-04-05 MED ORDER — PRAVASTATIN SODIUM 40 MG PO TABS
40.0000 mg | ORAL_TABLET | Freq: Every day | ORAL | Status: DC
  Filled 2014-04-05: qty 1

## 2014-04-05 MED ORDER — LABETALOL HCL 5 MG/ML IV SOLN
20.0000 mg | Freq: Once | INTRAVENOUS | Status: AC
  Administered 2014-04-05: 20 mg via INTRAVENOUS
  Filled 2014-04-05: qty 4

## 2014-04-05 MED ORDER — INSULIN ASPART 100 UNIT/ML ~~LOC~~ SOLN
0.0000 [IU] | Freq: Three times a day (TID) | SUBCUTANEOUS | Status: DC
  Administered 2014-04-06 – 2014-04-07 (×3): 2 [IU] via SUBCUTANEOUS

## 2014-04-05 MED ORDER — DONEPEZIL HCL 10 MG PO TABS
10.0000 mg | ORAL_TABLET | Freq: Every day | ORAL | Status: DC
  Administered 2014-04-05 – 2014-04-06 (×2): 10 mg via ORAL
  Filled 2014-04-05 (×2): qty 1

## 2014-04-05 MED ORDER — INSULIN GLARGINE 100 UNIT/ML SOLOSTAR PEN: 60.0000 [IU] | PEN_INJECTOR | Freq: Every day | SUBCUTANEOUS | Status: DC

## 2014-04-05 MED ORDER — MECLIZINE HCL 12.5 MG PO TABS
12.5000 mg | ORAL_TABLET | Freq: Three times a day (TID) | ORAL | Status: AC
  Administered 2014-04-05 – 2014-04-06 (×3): 12.5 mg via ORAL
  Filled 2014-04-05 (×3): qty 1

## 2014-04-05 MED ORDER — AMLODIPINE BESYLATE 10 MG PO TABS
10.0000 mg | ORAL_TABLET | Freq: Every day | ORAL | Status: DC
  Administered 2014-04-06 – 2014-04-07 (×2): 10 mg via ORAL
  Filled 2014-04-05 (×2): qty 1

## 2014-04-05 MED ORDER — ENOXAPARIN SODIUM 30 MG/0.3ML ~~LOC~~ SOLN
30.0000 mg | SUBCUTANEOUS | Status: DC
  Filled 2014-04-05: qty 0.3

## 2014-04-05 MED ORDER — MECLIZINE HCL 25 MG PO TABS
25.0000 mg | ORAL_TABLET | Freq: Once | ORAL | Status: AC
  Administered 2014-04-05: 25 mg via ORAL
  Filled 2014-04-05: qty 1

## 2014-04-05 MED ORDER — LABETALOL HCL 200 MG PO TABS
200.0000 mg | ORAL_TABLET | Freq: Two times a day (BID) | ORAL | Status: DC
  Administered 2014-04-05 – 2014-04-07 (×4): 200 mg via ORAL
  Filled 2014-04-05 (×4): qty 1

## 2014-04-05 MED ORDER — ONDANSETRON HCL 4 MG/2ML IJ SOLN
4.0000 mg | Freq: Once | INTRAMUSCULAR | Status: AC
  Administered 2014-04-05: 4 mg via INTRAVENOUS
  Filled 2014-04-05: qty 2

## 2014-04-05 MED ORDER — ASPIRIN 325 MG PO TABS
325.0000 mg | ORAL_TABLET | Freq: Every day | ORAL | Status: DC
  Administered 2014-04-06 – 2014-04-07 (×2): 325 mg via ORAL
  Filled 2014-04-05 (×2): qty 1

## 2014-04-05 MED ORDER — SODIUM CHLORIDE 0.9 % IV BOLUS (SEPSIS)
500.0000 mL | Freq: Once | INTRAVENOUS | Status: AC
  Administered 2014-04-05: 500 mL via INTRAVENOUS

## 2014-04-05 MED ORDER — INSULIN ASPART 100 UNIT/ML ~~LOC~~ SOLN: 0.0000 [IU] | Freq: Every day | SUBCUTANEOUS | Status: DC

## 2014-04-05 MED ORDER — STROKE: EARLY STAGES OF RECOVERY BOOK
Freq: Once | Status: AC
  Administered 2014-04-05: 20:00:00
  Filled 2014-04-05: qty 1

## 2014-04-05 MED ORDER — CLONIDINE HCL 0.1 MG PO TABS
0.1000 mg | ORAL_TABLET | Freq: Two times a day (BID) | ORAL | Status: DC
  Administered 2014-04-05 – 2014-04-07 (×4): 0.1 mg via ORAL
  Filled 2014-04-05 (×4): qty 1

## 2014-04-05 MED ORDER — ISOSORBIDE MONONITRATE ER 30 MG PO TB24
60.0000 mg | ORAL_TABLET | Freq: Every day | ORAL | Status: DC
  Administered 2014-04-05 – 2014-04-06 (×2): 60 mg via ORAL
  Filled 2014-04-05 (×2): qty 2

## 2014-04-05 MED ORDER — GABAPENTIN 100 MG PO CAPS: 100.0000 mg | ORAL_CAPSULE | Freq: Three times a day (TID) | ORAL | Status: DC | PRN

## 2014-04-05 MED ORDER — RISPERIDONE 0.5 MG PO TABS
1.0000 mg | ORAL_TABLET | Freq: Every day | ORAL | Status: DC
  Administered 2014-04-05 – 2014-04-06 (×2): 1 mg via ORAL
  Filled 2014-04-05 (×2): qty 2

## 2014-04-05 NOTE — ED Notes (Signed)
Pt. Transported to MRI will transport to floor once pt returns.

## 2014-04-05 NOTE — ED Notes (Signed)
Patient returned from xray.

## 2014-04-05 NOTE — ED Notes (Signed)
BP reported to EDP

## 2014-04-05 NOTE — ED Notes (Signed)
Attempted Report x1.   

## 2014-04-05 NOTE — ED Notes (Signed)
Patient transported to X-ray 

## 2014-04-05 NOTE — Progress Notes (Signed)
Pt is admitted to room  4N11 from ED. admission vitals are stable

## 2014-04-05 NOTE — ED Notes (Signed)
MD at bedside. ([place]) 

## 2014-04-05 NOTE — Plan of Care (Signed)
Problem: Acute Treatment Outcomes Goal: BP within ordered parameters Outcome: Completed/Met Date Met:  04/05/14 Goal: Airway maintained/protected Outcome: Completed/Met Date Met:  04/05/14 Goal: 02 Sats > 94% Outcome: Completed/Met Date Met:  04/05/14  Problem: Progression Outcomes Goal: Communication method established Outcome: Completed/Met Date Met:  04/05/14 Goal: Tolerating diet/TF at goal rate Outcome: Completed/Met Date Met:  04/05/14 Goal: Pain controlled Outcome: Completed/Met Date Met:  04/05/14

## 2014-04-05 NOTE — Consult Note (Signed)
Reason for Consult: dizziness  HPI:                                                                                                                                          Katie Dawson is an 80 y.o. female with a history of diabetes mellitus, hyperlipidemia, hypertension, renal insufficiency, dementia and intermittent vertigo presenting with recurrent dizziness with associated nausea and vomiting. She's also had diarrhea over the past 24 hours. She has not experienced focal motor or neurosensory changes. She also has not had visual changes. Speech has remained unchanged. CT scan of her head showed no acute intracranial abnormality.  Past Medical History  Diagnosis Date  . DIABETES MELLITUS, TYPE II 04/22/2007  . HYPERLIPIDEMIA 04/22/2007  . GOUT 04/22/2007  . ANEMIA-IRON DEFICIENCY 04/22/2007  . ANXIETY 04/22/2007  . OBSTRUCTIVE SLEEP APNEA 04/22/2007  . HYPERTENSION 04/22/2007  . GERD 04/22/2007  . RENAL INSUFFICIENCY 04/22/2007  . 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 FOOT PAIN, LEFT 12/23/2007  . OSTEOPENIA 02/12/2009  . INTERMITTENT VERTIGO 04/20/2009  . HYPERSOMNIA 01/11/2009  . COLONIC POLYPS, HX OF 04/22/2007  . NEPHRECTOMY, HX OF 04/22/2007  . Urinary tract infection     hx of  . Right fibular fracture July 2013  . Dementia     Past Surgical History  Procedure Laterality Date  . S/p right nephrectomy    . S/p parathyroid surgury    . Orif wrist fracture  01/02/2012    Procedure: OPEN REDUCTION INTERNAL FIXATION (ORIF) WRIST FRACTURE;  Surgeon: Katie Amor, MD;  Location: [place];  Service: Orthopedics;  Laterality: Right;  Open Reduction Internal Fixation Right Distal Radius     Family History  Problem Relation Age of Onset  . Heart disease Mother     Social History:  reports that she has never smoked. She has never used smokeless tobacco. She reports that she does not drink alcohol or use illicit drugs.  No Known Allergies  MEDICATIONS:                                                                                                                      I have reviewed the patient's current medications.   ROS:  History obtained from unobtainable from patient due to mental status   Blood pressure 153/56, pulse 64, temperature 97.4 F (36.3 C), temperature source Oral, resp. rate 20, height 5' 5.5" (1.664 m), SpO2 95 %.   Neurologic Examination:                                                                                                      Mental Status: Alert, oriented to place but disoriented to time, no acute distress.  Speech fluent without evidence of aphasia. Able to follow commands without difficulty. Cranial Nerves: II-Visual fields were normal. III/IV/VI-Pupils were equal and reacted. Extraocular movements were full and conjugate.    V/VII-no facial numbness and no facial weakness. VIII-normal. Katie Dawson was slurred, consistent with patient being edentulous. Motor: 5/5 bilaterally with normal tone and bulk Sensory: Normal throughout. Deep Tendon Reflexes: 2+ and symmetric in upper extremities and absent at knees and ankles. Plantars: Flexor bilaterally Cerebellar: Normal finger-to-nose testing except for minimal intention tremor bilaterally.  Lab Results  Component Value Date/Time   CHOL 226* 09/16/2013 04:03 PM    Results for orders placed or performed during the hospital encounter of 04/05/14 (from the past 48 hour(s))  POC CBG, ED     Status: Abnormal   Collection Time: 04/05/14  9:08 AM  Result Value Ref Range   Glucose-Capillary 179 (H) 70 - 99 mg/dL   Comment 1 Notify RN   CBC with Differential     Status: Abnormal   Collection Time: 04/05/14 10:07 AM  Result Value Ref Range   WBC 11.5 (H) 4.0 - 10.5 K/uL   RBC 4.41 3.87 - 5.11 MIL/uL   Hemoglobin 12.5 12.0 - 15.0 g/dL    HCT 39.1 36.0 - 46.0 %   MCV 88.7 78.0 - 100.0 fL   MCH 28.3 26.0 - 34.0 pg   MCHC 32.0 30.0 - 36.0 g/dL   RDW 15.1 11.5 - 15.5 %   Platelets 310 150 - 400 K/uL   Neutrophils Relative % 87 (H) 43 - 77 %   Neutro Abs 10.0 (H) 1.7 - 7.7 K/uL   Lymphocytes Relative 9 (L) 12 - 46 %   Lymphs Abs 1.0 0.7 - 4.0 K/uL   Monocytes Relative 4 3 - 12 %   Monocytes Absolute 0.4 0.1 - 1.0 K/uL   Eosinophils Relative 0 0 - 5 %   Eosinophils Absolute 0.0 0.0 - 0.7 K/uL   Basophils Relative 0 0 - 1 %   Basophils Absolute 0.0 0.0 - 0.1 K/uL  Comprehensive metabolic panel     Status: Abnormal   Collection Time: 04/05/14 10:07 AM  Result Value Ref Range   Sodium 145 137 - 147 mEq/L   Potassium 3.4 (L) 3.7 - 5.3 mEq/L   Chloride 104 96 - 112 mEq/L   CO2 29 19 - 32 mEq/L   Glucose, Bld 176 (H) 70 - 99 mg/dL   BUN 14 6 - 23 mg/dL   Creatinine, Ser 0.86 0.50 - 1.10 mg/dL   Calcium 9.7 8.4 - 10.5 mg/dL   Total Protein 6.9  6.0 - 8.3 g/dL   Albumin 3.0 (L) 3.5 - 5.2 g/dL   AST 9 0 - 37 U/L   ALT 8 0 - 35 U/L   Alkaline Phosphatase 71 39 - 117 U/L   Total Bilirubin 0.5 0.3 - 1.2 mg/dL   GFR calc non Af Amer 63 (L) >90 mL/min   GFR calc Af Amer 73 (L) >90 mL/min    Comment: (NOTE) The eGFR has been calculated using the CKD EPI equation. This calculation has not been validated in all clinical situations. eGFR's persistently <90 mL/min signify possible Chronic Kidney Disease.    Anion gap 12 5 - 15  Troponin I     Status: None   Collection Time: 04/05/14 10:07 AM  Result Value Ref Range   Troponin I <0.30 <0.30 ng/mL    Comment:        Due to the release kinetics of cTnI, a negative result within the first hours of the onset of symptoms does not rule out myocardial infarction with certainty. If myocardial infarction is still suspected, repeat the test at appropriate intervals.   Protime-INR     Status: None   Collection Time: 04/05/14 10:07 AM  Result Value Ref Range   Prothrombin Time 13.9  11.6 - 15.2 seconds   INR 1.06 0.00 - 1.49    Dg Chest 2 View  04/05/2014   CLINICAL DATA:  Dizziness hypertension nausea and weakness  EXAM: CHEST  2 VIEW  COMPARISON:  Portable chest x-ray of Oct 16, 2013  FINDINGS: The lungs are slightly less well inflated today. Subtle nodularity projects in the lower third of the right lung. There is soft tissue fullness in the hilar regions bilaterally which is stable. The cardiac silhouette is enlarged. The pulmonary vascularity is not engorged. A trace of pleural fluid. Blunts the lateral costophrenic angles.  IMPRESSION: There is no evidence of pneumonia nor significant pulmonary edema. Subtle nodularity projects in the lower third of the right lung and appears unchanged.   Electronically Signed   By: David  Martinique   On: 04/05/2014 09:35   Ct Head Wo Contrast  04/05/2014   CLINICAL DATA:  80 year old female with weakness and dizziness since last night. Nausea and vomiting this morning. Current history of hypertension. Initial encounter.  EXAM: CT HEAD WITHOUT CONTRAST  TECHNIQUE: Contiguous axial images were obtained from the base of the skull through the vertex without intravenous contrast.  COMPARISON:  12/02/2013 and earlier.  FINDINGS: Visualized paranasal sinuses and mastoids are clear. No acute osseous abnormality identified. Visualized orbits and scalp soft tissues are within normal limits.  Calcified atherosclerosis at the skull base. Stable cerebral volume. No ventriculomegaly. No midline shift, mass effect, or evidence of intracranial mass lesion. Chronic confluent cerebral white matter hypodensity, stable. No acute intracranial hemorrhage identified. No evidence of cortically based acute infarction identified.  IMPRESSION: No acute intracranial abnormality. Chronic nonspecific white matter changes.   Electronically Signed   By: Lars Pinks M.D.   On: 04/05/2014 09:51    Assessment/Plan: 80 year old lady with multiple medical problems including  intermittent vertigo as well as dementia, presenting with vertigo with associated nausea and vomiting over the past 24 hours. Patient's also been experiencing diarrhea. Neurological exam was unremarkable except for mental status abnormalities associated with dementia. Brainstem or cerebellar stroke is unlikely but cannot be completely ruled out.  Recommendations: 1. MRI of the brain without contrast. 2. Stroke risk assessment if MRI shows acute infarction, including MRA, carotid Doppler study, 2-D  echocardiogram, hemoglobin A1c and fasting lipid panel. 3. If MRI study is unremarkable, no further diagnostic studies are indicated from a neurologic standpoint. 4. Consider a trial of meclizine 25 mg every 6-8 hours when necessary vertigo 5. PT consult to assess gait, and possible vestibular therapy if vertigo continues 6. Aspirin 81 mg per day.  C.R. Nicole Kindred, MD [facility] [phone]  04/05/2014, 1:12 PM

## 2014-04-05 NOTE — ED Notes (Signed)
Called to 4N to give report. Nurse to call me back.

## 2014-04-05 NOTE — ED Provider Notes (Signed)
CSN: [id number]     Arrival date & time 04/05/14  0846 History   First MD Initiated Contact with Patient 04/05/14 [phone]     Chief Complaint  Patient presents with  . Weakness  . Dizziness  . Hypertension  . Nausea     (Consider location/radiation/quality/duration/timing/severity/associated sxs/prior Treatment) Patient is a 80 y.o. female presenting with weakness, dizziness, and hypertension. The history is provided by the patient and a caregiver.  Weakness This is a new problem. The current episode started yesterday. The problem occurs constantly. The problem has not changed since onset.Pertinent negatives include no chest pain, no abdominal pain, no headaches and no shortness of breath. Nothing aggravates the symptoms. Nothing relieves the symptoms. She has tried nothing for the symptoms. The treatment provided no relief.  Dizziness Quality:  Unable to specify Severity:  Mild Onset quality:  Unable to specify Duration:  1 day Timing:  Constant Progression:  Unchanged Chronicity:  New Context comment:  At rest Relieved by:  Nothing Worsened by:  Standing up Ineffective treatments:  None tried Associated symptoms: diarrhea, nausea and vomiting   Associated symptoms: no chest pain, no headaches and no shortness of breath   Hypertension Pertinent negatives include no chest pain, no abdominal pain, no headaches and no shortness of breath.    Past Medical History  Diagnosis Date  . DIABETES MELLITUS, TYPE II 04/22/2007  . HYPERLIPIDEMIA 04/22/2007  . GOUT 04/22/2007  . ANEMIA-IRON DEFICIENCY 04/22/2007  . ANXIETY 04/22/2007  . OBSTRUCTIVE SLEEP APNEA 04/22/2007  . HYPERTENSION 04/22/2007  . GERD 04/22/2007  . RENAL INSUFFICIENCY 04/22/2007  . 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 FOOT PAIN, LEFT 12/23/2007  . OSTEOPENIA 02/12/2009  . INTERMITTENT VERTIGO 04/20/2009  . HYPERSOMNIA 01/11/2009  . COLONIC POLYPS, HX OF 04/22/2007  . NEPHRECTOMY, HX OF 04/22/2007  .  Urinary tract infection     hx of  . Right fibular fracture July 2013  . Dementia    Past Surgical History  Procedure Laterality Date  . S/p right nephrectomy    . S/p parathyroid surgury    . Orif wrist fracture  01/02/2012    Procedure: OPEN REDUCTION INTERNAL FIXATION (ORIF) WRIST FRACTURE;  Surgeon: Schuyler Amor, MD;  Location: [place];  Service: Orthopedics;  Laterality: Right;  Open Reduction Internal Fixation Right Distal Radius    Family History  Problem Relation Age of Onset  . Heart disease Mother    History  Substance Use Topics  . Smoking status: Never Smoker   . Smokeless tobacco: Never Used  . Alcohol Use: No   OB History    No data available     Review of Systems  Constitutional: Negative for fever and fatigue.  HENT: Negative for congestion and drooling.   Eyes: Negative for pain.  Respiratory: Negative for cough and shortness of breath.   Cardiovascular: Negative for chest pain.  Gastrointestinal: Positive for nausea, vomiting and diarrhea. Negative for abdominal pain.  Genitourinary: Negative for dysuria and hematuria.  Musculoskeletal: Negative for back pain, gait problem and neck pain.  Skin: Negative for color change.  Neurological: Positive for dizziness and weakness. Negative for headaches.  Hematological: Negative for adenopathy.  Psychiatric/Behavioral: Negative for behavioral problems.  All other systems reviewed and are negative.     Allergies  Review of patient's allergies indicates no known allergies.  Home Medications   Prior to Admission medications   Medication Sig Start Date End Date Taking? Authorizing Provider  amLODipine (NORVASC) 10 MG tablet TAKE  1 TABLET EACH DAY. 03/22/14   Biagio Borg, MD  BD PEN NEEDLE NANO U/F 32G X 4 MM MISC USE WITH LANTUS DAILY.    Biagio Borg, MD  busPIRone (BUSPAR) 7.5 MG tablet TAKE 1 TABLET THREE TIMES DAILY AS NEEDED FOR NERVES. 01/30/14   Biagio Borg, MD  cephALEXin (KEFLEX) 500 MG capsule  Take 1 capsule (500 mg total) by mouth 4 (four) times daily. 02/07/14   Biagio Borg, MD  citalopram (CELEXA) 10 MG tablet Take 10 mg by mouth daily.    Historical Provider, MD  cloNIDine (CATAPRES) 0.1 MG tablet Take 0.1 mg by mouth 2 (two) times daily.    Historical Provider, MD  donepezil (ARICEPT) 10 MG tablet Take 1 tablet (10 mg total) by mouth at bedtime. 09/16/13   Biagio Borg, MD  gabapentin (NEURONTIN) 100 MG capsule TAKE 1 CAPSULE THREE TIMES DAILY AS NEEDED FOR PAIN. 01/30/14   Biagio Borg, MD  glipiZIDE (GLUCOTROL XL) 5 MG 24 hr tablet Take 1 tablet (5 mg total) by mouth daily with breakfast. 02/07/14   Biagio Borg, MD  glucose blood (BAYER CONTOUR NEXT TEST) test strip Use as instructed 09/16/13   Biagio Borg, MD  insulin glargine (LANTUS) 100 UNIT/ML injection Inject 0.6 mLs (60 Units total) into the skin at bedtime. 02/10/13 02/10/14  Biagio Borg, MD  isosorbide mononitrate (IMDUR) 60 MG 24 hr tablet Take 60 mg by mouth at bedtime.    Historical Provider, MD  labetalol (NORMODYNE) 200 MG tablet TAKE 1 TABLET TWICE DAILY. 03/22/14   Biagio Borg, MD  Lancets MISC Use as directed 1 per day 09/16/13   Biagio Borg, MD  LANTUS SOLOSTAR 100 UNIT/ML Solostar Pen INJECT 60 UNITS AT BEDTIME AS DIRECTED. 02/27/14   Biagio Borg, MD  pravastatin (PRAVACHOL) 40 MG tablet Take 1 tablet (40 mg total) by mouth daily. 09/16/13   Biagio Borg, MD  risperiDONE (RISPERDAL) 0.25 MG tablet 1 tab by mouth in the AM, then 4 tabs by mouth in the PM 09/16/13   Biagio Borg, MD   Pulse 57  Temp(Src) 97.6 F (36.4 C) (Oral)  Resp 19  Ht 5' 5.5" (1.664 m)  SpO2 96% Physical Exam  Constitutional: She appears well-developed and well-nourished.  HENT:  Head: Normocephalic and atraumatic.  Mouth/Throat: Oropharynx is clear and moist. No oropharyngeal exudate.  Eyes: Conjunctivae and EOM are normal. Pupils are equal, round, and reactive to light.  Neck: Normal range of motion. Neck supple.  Cardiovascular:  Normal rate, regular rhythm and intact distal pulses.  Exam reveals no gallop and no friction rub.   Murmur heard. Pulmonary/Chest: Effort normal and breath sounds normal. No respiratory distress. She has no wheezes.  Abdominal: Soft. Bowel sounds are normal. There is no tenderness. There is no rebound and no guarding.  Musculoskeletal: Normal range of motion. She exhibits no edema or tenderness.  Neurological: She is alert.  alert, oriented x2, doesn't know year speech: normal in context and clarity memory: intact grossly cranial nerves II-XII: intact motor strength: full proximally and distally no involuntary movements or tremors sensation: intact to light touch diffusely  cerebellar: finger-to-nose and heel-to-shin intact gait: shuffling gait, unsteady on feet, slight worsening of dizziness w/ standing  Skin: Skin is warm and dry.  Psychiatric: She has a normal mood and affect. Her behavior is normal.  Nursing note and vitals reviewed.   ED Course  Procedures (including critical care  time) Labs Review Labs Reviewed  CBC WITH DIFFERENTIAL - Abnormal; Notable for the following:    WBC 11.5 (*)    Neutrophils Relative % 87 (*)    Neutro Abs 10.0 (*)    Lymphocytes Relative 9 (*)    All other components within normal limits  COMPREHENSIVE METABOLIC PANEL - Abnormal; Notable for the following:    Potassium 3.4 (*)    Glucose, Bld 176 (*)    Albumin 3.0 (*)    GFR calc non Af Amer 63 (*)    GFR calc Af Amer 73 (*)    All other components within normal limits  URINALYSIS, ROUTINE W REFLEX MICROSCOPIC - Abnormal; Notable for the following:    APPearance HAZY (*)    Hgb urine dipstick SMALL (*)    Ketones, ur 15 (*)    Protein, ur >300 (*)    Leukocytes, UA SMALL (*)    All other components within normal limits  URINE MICROSCOPIC-ADD ON - Abnormal; Notable for the following:    Squamous Epithelial / LPF MANY (*)    Bacteria, UA FEW (*)    All other components within normal  limits  CBG MONITORING, ED - Abnormal; Notable for the following:    Glucose-Capillary 179 (*)    All other components within normal limits  TROPONIN I  URINE RAPID DRUG SCREEN (HOSP PERFORMED)  PROTIME-INR  HEMOGLOBIN A1C  LIPID PANEL  URINALYSIS, ROUTINE W REFLEX MICROSCOPIC    Imaging Review Dg Chest 2 View  04/05/2014   CLINICAL DATA:  Dizziness hypertension nausea and weakness  EXAM: CHEST  2 VIEW  COMPARISON:  Portable chest x-ray of Oct 16, 2013  FINDINGS: The lungs are slightly less well inflated today. Subtle nodularity projects in the lower third of the right lung. There is soft tissue fullness in the hilar regions bilaterally which is stable. The cardiac silhouette is enlarged. The pulmonary vascularity is not engorged. A trace of pleural fluid. Blunts the lateral costophrenic angles.  IMPRESSION: There is no evidence of pneumonia nor significant pulmonary edema. Subtle nodularity projects in the lower third of the right lung and appears unchanged.   Electronically Signed   By: David  Martinique   On: 04/05/2014 09:35   Ct Head Wo Contrast  04/05/2014   CLINICAL DATA:  80 year old female with weakness and dizziness since last night. Nausea and vomiting this morning. Current history of hypertension. Initial encounter.  EXAM: CT HEAD WITHOUT CONTRAST  TECHNIQUE: Contiguous axial images were obtained from the base of the skull through the vertex without intravenous contrast.  COMPARISON:  12/02/2013 and earlier.  FINDINGS: Visualized paranasal sinuses and mastoids are clear. No acute osseous abnormality identified. Visualized orbits and scalp soft tissues are within normal limits.  Calcified atherosclerosis at the skull base. Stable cerebral volume. No ventriculomegaly. No midline shift, mass effect, or evidence of intracranial mass lesion. Chronic confluent cerebral white matter hypodensity, stable. No acute intracranial hemorrhage identified. No evidence of cortically based acute infarction  identified.  IMPRESSION: No acute intracranial abnormality. Chronic nonspecific white matter changes.   Electronically Signed   By: Lars Pinks M.D.   On: 04/05/2014 09:51   Mr Brain Wo Contrast  04/05/2014   CLINICAL DATA:  Recurrent dizziness with nausea and vomiting.  EXAM: MRI HEAD WITHOUT CONTRAST  TECHNIQUE: Multiplanar, multiecho pulse sequences of the brain and surrounding structures were obtained without intravenous contrast.  COMPARISON:  Head CT same day.  MRI 10/17/2013  FINDINGS: Diffusion imaging does not  show any acute or subacute infarction. There are chronic small-vessel ischemic changes affecting the pons. There are a few old small vessel cerebellar insults. The cerebral hemispheres show confluent chronic small vessel disease throughout the deep and subcortical white matter. No cortical or large vessel territory infarction. No mass lesion, hemorrhage, hydrocephalus or extra-axial collection. No pituitary mass. No inflammatory sinus disease. No skull or skullbase lesion. The appearance is similar to the study of 10/17/2013.  IMPRESSION: No change since 10/17/2013. Advanced chronic small vessel disease throughout the brain without evidence of acute or reversible process.   Electronically Signed   By: Nelson Chimes M.D.   On: 04/05/2014 18:16     EKG Interpretation   Date/Time:  Wednesday April 05 2014 08:51:44 EST Ventricular Rate:  58 PR Interval:  224 QRS Duration: 114 QT Interval:  478 QTC Calculation: 469 R Axis:   19 Text Interpretation:  Sinus rhythm Prolonged PR interval Incomplete left  bundle branch block Minimal ST elevation, anterior leads No significant  change since last tracing Confirmed by Aaryan Essman  MD, Shakala Marlatt ([id number]) on  04/05/2014 9:18:20 AM      MDM   Final diagnoses:  Dizziness    9:10 AM 80 y.o. female w hx of DM, HLP, hx of nephrectomy, dementia Who presents with nausea, vomiting, diarrhea and dizziness which began last night. The caregiver denies any  known fevers. The patient had persistent symptoms today. She was not able to walk straight per her caretaker. She is normally ambulatory with minimal to no assistance.the patient is afebrile and vital signs are unremarkable here. She has a shuffling gait and appears unsteady when standing. A/o x2, doesn't know year, not unusual per family. Otherwise neurologically intact.  Pt now unable to ambulate d/t dizziness. Will admit to hospitalist.     Pamella Pert, MD 04/05/14 2006

## 2014-04-05 NOTE — ED Notes (Signed)
80 yo female from home with c/o weakness and dizziness starting last night. Today had had episodes of emesis and nausea. EMS-pt cool clammy, ekg done no abnormalities. CBG 212 HTN 190/110. Gave 4mg  of zofran. Pt alert and oriented.

## 2014-04-05 NOTE — H&P (Signed)
History and Physical       Hospital Admission Note Date: 04/05/2014  Patient name: Katie Dawson Medical record number: [id number] Date of birth: March 14, 1934 Age: 80 y.o. Gender: female  PCP: Cathlean Cower, MD    Chief Complaint:  Dizziness, weakness, confusion  HPI: Patient is a 80 year old female with history of diabetes mellitus, insulin-dependent, hyperlipidemia, OSA, hypertension, GERD, dementia was brought to ED from home complaining of weakness and dizziness. Patient is a poor historian due to dementia. History was obtained from ED records. Patient had been complaining of weakness and dizziness that started last night. This morning she had an episode of nausea and vomiting. Patient's BP was noted by EMS as 190/110. EDP tried to ambulate the patient however she was found to be weak, slow, shuffling and still dizzy. Hospitalist service was requested for admission. Patient endorses dizziness and states it happens 'off and on', denied any diarrhea.  Review of Systems:  Unable to obtain review of systems from the patient due to her confusion  Past Medical History: Past Medical History  Diagnosis Date  . DIABETES MELLITUS, TYPE II 04/22/2007  . HYPERLIPIDEMIA 04/22/2007  . GOUT 04/22/2007  . ANEMIA-IRON DEFICIENCY 04/22/2007  . ANXIETY 04/22/2007  . OBSTRUCTIVE SLEEP APNEA 04/22/2007  . HYPERTENSION 04/22/2007  . GERD 04/22/2007  . RENAL INSUFFICIENCY 04/22/2007  . 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 FOOT PAIN, LEFT 12/23/2007  . OSTEOPENIA 02/12/2009  . INTERMITTENT VERTIGO 04/20/2009  . HYPERSOMNIA 01/11/2009  . COLONIC POLYPS, HX OF 04/22/2007  . NEPHRECTOMY, HX OF 04/22/2007  . Urinary tract infection     hx of  . Right fibular fracture July 2013  . Dementia    Past Surgical History  Procedure Laterality Date  . S/p right nephrectomy    . S/p parathyroid surgury    . Orif wrist fracture  01/02/2012     Procedure: OPEN REDUCTION INTERNAL FIXATION (ORIF) WRIST FRACTURE;  Surgeon: Schuyler Amor, MD;  Location: [facility];  Service: Orthopedics;  Laterality: Right;  Open Reduction Internal Fixation Right Distal Radius     Medications: Prior to Admission medications   Medication Sig Start Date End Date Taking? Authorizing Provider  amLODipine (NORVASC) 10 MG tablet TAKE 1 TABLET EACH DAY. 03/22/14   Biagio Borg, MD  BD PEN NEEDLE NANO U/F 32G X 4 MM MISC USE WITH LANTUS DAILY.    Biagio Borg, MD  busPIRone (BUSPAR) 7.5 MG tablet TAKE 1 TABLET THREE TIMES DAILY AS NEEDED FOR NERVES. 01/30/14   Biagio Borg, MD  cephALEXin (KEFLEX) 500 MG capsule Take 1 capsule (500 mg total) by mouth 4 (four) times daily. 02/07/14   Biagio Borg, MD  citalopram (CELEXA) 10 MG tablet Take 10 mg by mouth daily.    Historical Provider, MD  cloNIDine (CATAPRES) 0.1 MG tablet Take 0.1 mg by mouth 2 (two) times daily.    Historical Provider, MD  donepezil (ARICEPT) 10 MG tablet Take 1 tablet (10 mg total) by mouth at bedtime. 09/16/13   Biagio Borg, MD  gabapentin (NEURONTIN) 100 MG capsule TAKE 1 CAPSULE THREE TIMES DAILY AS NEEDED FOR PAIN. 01/30/14   Biagio Borg, MD  glipiZIDE (GLUCOTROL XL) 5 MG 24 hr tablet Take 1 tablet (5 mg total) by mouth daily with breakfast. 02/07/14   Biagio Borg, MD  glucose blood (BAYER CONTOUR NEXT TEST) test strip Use as instructed 09/16/13   Biagio Borg, MD  insulin glargine (LANTUS) 100 UNIT/ML injection  Inject 0.6 mLs (60 Units total) into the skin at bedtime. 02/10/13 02/10/14  Biagio Borg, MD  isosorbide mononitrate (IMDUR) 60 MG 24 hr tablet Take 60 mg by mouth at bedtime.    Historical Provider, MD  labetalol (NORMODYNE) 200 MG tablet TAKE 1 TABLET TWICE DAILY. 03/22/14   Biagio Borg, MD  Lancets MISC Use as directed 1 per day 09/16/13   Biagio Borg, MD  LANTUS SOLOSTAR 100 UNIT/ML Solostar Pen INJECT 60 UNITS AT BEDTIME AS DIRECTED. 02/27/14   Biagio Borg, MD  pravastatin (PRAVACHOL)  40 MG tablet Take 1 tablet (40 mg total) by mouth daily. 09/16/13   Biagio Borg, MD  risperiDONE (RISPERDAL) 0.25 MG tablet 1 tab by mouth in the AM, then 4 tabs by mouth in the PM 09/16/13   Biagio Borg, MD    Allergies:  No Known Allergies  Social History:  reports that she has never smoked. She has never used smokeless tobacco. She reports that she does not drink alcohol or use illicit drugs.  Family History: Family History  Problem Relation Age of Onset  . Heart disease Mother     Physical Exam: Blood pressure 153/56, pulse 64, temperature 97.4 F (36.3 C), temperature source Oral, resp. rate 20, height 5' 5.5" (1.664 m), SpO2 95 %. General: Alert, awake, oriented to self, in no acute distress. HEENT: normocephalic, atraumatic, anicteric sclera, pink conjunctiva, pupils equal and reactive to light and accomodation, oropharynx clear Neck: supple, no masses or lymphadenopathy, no goiter, no bruits  Heart: Regular rate and rhythm, without murmurs, rubs or gallops. Lungs: Clear to auscultation bilaterally, no wheezing, rales or rhonchi. Abdomen: Soft, nontender, nondistended, positive bowel sounds, no masses. Extremities: No clubbing, cyanosis or edema with positive pedal pulses. Neuro: Grossly intact, no focal neurological deficits, strength 5/5 upper and lower extremities bilaterally Psych: alert and oriented to self, normal mood and affect Skin: no rashes or lesions, warm and dry   LABS on Admission:  Basic Metabolic Panel:  Recent Labs Lab 04/05/14 1007  NA 145  K 3.4*  CL 104  CO2 29  GLUCOSE 176*  BUN 14  CREATININE 0.86  CALCIUM 9.7   Liver Function Tests:  Recent Labs Lab 04/05/14 1007  AST 9  ALT 8  ALKPHOS 71  BILITOT 0.5  PROT 6.9  ALBUMIN 3.0*   No results for input(s): LIPASE, AMYLASE in the last 168 hours. No results for input(s): AMMONIA in the last 168 hours. CBC:  Recent Labs Lab 04/05/14 1007  WBC 11.5*  NEUTROABS 10.0*  HGB 12.5   HCT 39.1  MCV 88.7  PLT 310   Cardiac Enzymes:  Recent Labs Lab 04/05/14 1007  TROPONINI <0.30   BNP: Invalid input(s): POCBNP CBG:  Recent Labs Lab 04/05/14 0908  GLUCAP 179*     Radiological Exams on Admission: Dg Chest 2 View  04/05/2014   CLINICAL DATA:  Dizziness hypertension nausea and weakness  EXAM: CHEST  2 VIEW  COMPARISON:  Portable chest x-ray of Oct 16, 2013  FINDINGS: The lungs are slightly less well inflated today. Subtle nodularity projects in the lower third of the right lung. There is soft tissue fullness in the hilar regions bilaterally which is stable. The cardiac silhouette is enlarged. The pulmonary vascularity is not engorged. A trace of pleural fluid. Blunts the lateral costophrenic angles.  IMPRESSION: There is no evidence of pneumonia nor significant pulmonary edema. Subtle nodularity projects in the lower third of the right  lung and appears unchanged.   Electronically Signed   By: David  Martinique   On: 04/05/2014 09:35   Ct Head Wo Contrast  04/05/2014   CLINICAL DATA:  80 year old female with weakness and dizziness since last night. Nausea and vomiting this morning. Current history of hypertension. Initial encounter.  EXAM: CT HEAD WITHOUT CONTRAST  TECHNIQUE: Contiguous axial images were obtained from the base of the skull through the vertex without intravenous contrast.  COMPARISON:  12/02/2013 and earlier.  FINDINGS: Visualized paranasal sinuses and mastoids are clear. No acute osseous abnormality identified. Visualized orbits and scalp soft tissues are within normal limits.  Calcified atherosclerosis at the skull base. Stable cerebral volume. No ventriculomegaly. No midline shift, mass effect, or evidence of intracranial mass lesion. Chronic confluent cerebral white matter hypodensity, stable. No acute intracranial hemorrhage identified. No evidence of cortically based acute infarction identified.  IMPRESSION: No acute intracranial abnormality. Chronic  nonspecific white matter changes.   Electronically Signed   By: Lars Pinks M.D.   On: 04/05/2014 09:51    Assessment/Plan Principal Problem:   Dizziness with nausea and vomiting: possibly due to vertigo, rule out TIA/CVA - admit for observation, obtain MRI of the brain, requested for neurology consult - We will pursue stroke workup if MRI positive for CVA or if recommended by neurology - Continue fall precautions, neuro checks, aspirin - PTOT consult  Active Problems:   Insulin dependent diabetes mellitus - placed on Lantus, continue sliding scale insulin   Accelerated hypertension - Restart all antihypertensives, patient is on clonidine, amlodipine, labetalol, Imdur    Dementia - Continue risperidone  DVT prophylaxis: Lovenox  CODE STATUS: full CODE STATUS for now  Family Communication: no family member present at the bedside  Further plan will depend as patient's clinical course evolves and further radiologic and laboratory data become available.   Time Spent on Admission: 1 hour  RAI,RIPUDEEP M.D. [facility] 04/05/2014, 12:40 PM Pager: [phone]  If 7PM-7AM, please contact night-coverage [email or web address] Password [email or web address]

## 2014-04-05 NOTE — ED Notes (Signed)
Notified RN of CBG 179

## 2014-04-05 NOTE — ED Notes (Signed)
Called [facility] and spoke with Nurse for rm 11. Said he would call back in 10 min.

## 2014-04-06 LAB — LIPID PANEL
Cholesterol: 208 mg/dL — ABNORMAL HIGH (ref 0–200)
HDL: 26 mg/dL — ABNORMAL LOW (ref >39)
LDL Cholesterol: 150 mg/dL — ABNORMAL HIGH (ref 0–99)
Total CHOL/HDL Ratio: 8 RATIO
Triglycerides: 160 mg/dL — ABNORMAL HIGH (ref <150)
VLDL: 32 mg/dL (ref 0–40)

## 2014-04-06 LAB — GLUCOSE, CAPILLARY
GLUCOSE-CAPILLARY: 154 mg/dL — AB (ref 70–99)
Glucose-Capillary: 96 mg/dL (ref 70–99)
Glucose-Capillary: 190 mg/dL — ABNORMAL HIGH (ref 70–99)
Glucose-Capillary: 143 mg/dL — ABNORMAL HIGH (ref 70–99)

## 2014-04-06 LAB — HEMOGLOBIN A1C
Hgb A1c MFr Bld: 6.2 % — ABNORMAL HIGH (ref <5.7)
Mean Plasma Glucose: 131 mg/dL — ABNORMAL HIGH (ref <117)

## 2014-04-06 MED ORDER — POTASSIUM CHLORIDE CRYS ER 20 MEQ PO TBCR
40.0000 meq | EXTENDED_RELEASE_TABLET | Freq: Once | ORAL | Status: AC
  Administered 2014-04-06: 40 meq via ORAL
  Filled 2014-04-06: qty 2

## 2014-04-06 MED ORDER — HALOPERIDOL LACTATE 5 MG/ML IJ SOLN
0.5000 mg | INTRAMUSCULAR | Status: AC | PRN
Start: 2014-04-06 — End: 2014-04-06
  Administered 2014-04-06 (×2): 0.5 mg via INTRAVENOUS
  Filled 2014-04-06 (×2): qty 1

## 2014-04-06 MED ORDER — HYDRALAZINE HCL 25 MG PO TABS
25.0000 mg | ORAL_TABLET | Freq: Three times a day (TID) | ORAL | Status: DC
  Administered 2014-04-06 (×2): 25 mg via ORAL
  Filled 2014-04-06 (×3): qty 1

## 2014-04-06 MED ORDER — PRAVASTATIN SODIUM 80 MG PO TABS
80.0000 mg | ORAL_TABLET | Freq: Every day | ORAL | Status: DC
  Administered 2014-04-06: 80 mg via ORAL
  Filled 2014-04-06 (×2): qty 1

## 2014-04-06 NOTE — Progress Notes (Signed)
Patient Demographics  Katie Dawson, is a 80 y.o. female, DOB - 1933-12-18, GSU:[id number]  Admit date - 04/05/2014   Admitting Physician Ripudeep Krystal Eaton, MD  Outpatient Primary MD for the patient is Cathlean Cower, MD  LOS - 1   Chief Complaint  Patient presents with  . Weakness  . Dizziness  . Hypertension  . Nausea        Subjective:   Katie Dawson today has, No headache, No chest pain, No abdominal pain - No Nausea, No new weakness tingling or numbness, No Cough - SOB. No dizziness.  Assessment & Plan    Dizziness with nausea and vomiting: possibly due to vertigo, rule out posterior circulationTIA/CVA - neurology on board, placed on aspirin, symptoms improved, CT head and MRI brain nonacute. Pending A1c, echogram and carotid duplex. LDL greater than 70 have increased statin dose. PTOT and speech eval pending.   2. DM2 - on Lantus along with sliding scale.   Lab Results  Component Value Date   HGBA1C 6.9* 10/16/2013    CBG (last 3)   Recent Labs  04/05/14 0908 04/05/14 2049 04/06/14 0640  GLUCAP 179* 99 96      3. Accelerated hypertension - Restart all antihypertensives, patient is on clonidine, amlodipine, labetalol, Imdur, we'll add low-dose hydralazine for better control.   4. Dementia - Continue risperidone   5. Low potassium. Replace.     Code Status: full  Family Communication: none present  Disposition Plan: home   Procedures CT head, MRI head, echogram, carotid duplex   Consults  Neuro    Medications  Scheduled Meds: . amLODipine  10 mg Oral Daily  . aspirin  325 mg Oral Daily  . citalopram  10 mg Oral Daily  . cloNIDine  0.1 mg Oral BID  . donepezil  10 mg Oral QHS  . enoxaparin (LOVENOX) injection  30 mg Subcutaneous Q24H  . insulin aspart   0-5 Units Subcutaneous QHS  . insulin aspart  0-9 Units Subcutaneous TID WC  . isosorbide mononitrate  60 mg Oral QHS  . labetalol  200 mg Oral BID  . meclizine  12.5 mg Oral TID  . pravastatin  80 mg Oral q1800  . risperiDONE  1 mg Oral QHS   Continuous Infusions: . sodium chloride 75 mL/hr at 04/06/14 0517   PRN Meds:.acetaminophen, gabapentin  DVT Prophylaxis  Lovenox    Lab Results  Component Value Date   PLT 310 04/05/2014    Antibiotics     Anti-infectives    None          Objective:   Filed Vitals:   04/05/14 2300 04/06/14 0156 04/06/14 0518 04/06/14 0951  BP:  151/60 149/54 174/68  Pulse:  62  62  Temp:  98.4 F (36.9 C) 97.9 F (36.6 C) 97.5 F (36.4 C)  TempSrc:  Oral Oral Oral  Resp:  18 18 20   Height: 5\' 6"  (1.676 m)     Weight: 108.5 kg (239 lb 3.2 oz)     SpO2:  93% 91% 92%    Wt Readings from Last 3 Encounters:  04/05/14 108.5 kg (239 lb 3.2 oz)  04/05/14 113.399 kg (250 lb)  02/07/14 112.038 kg (247 lb)  No intake or output data in the 24 hours ending 04/06/14 1110   Physical Exam  Awake Alert, Oriented X 3, No new F.N deficits, Normal affect [place].AT,PERRAL Supple Neck,No JVD, No cervical lymphadenopathy appriciated.  Symmetrical Chest wall movement, Good air movement bilaterally, CTAB RRR,No Gallops,Rubs or new Murmurs, No Parasternal Heave +ve B.Sounds, Abd Soft, No tenderness, No organomegaly appriciated, No rebound - guarding or rigidity. No Cyanosis, Clubbing or edema, No new Rash or bruise      Data Review   Micro Results No results found for this or any previous visit (from the past 240 hour(s)).  Radiology Reports Dg Chest 2 View  04/05/2014   CLINICAL DATA:  Dizziness hypertension nausea and weakness  EXAM: CHEST  2 VIEW  COMPARISON:  Portable chest x-ray of Oct 16, 2013  FINDINGS: The lungs are slightly less well inflated today. Subtle nodularity projects in the lower third of the right lung. There is soft tissue  fullness in the hilar regions bilaterally which is stable. The cardiac silhouette is enlarged. The pulmonary vascularity is not engorged. A trace of pleural fluid. Blunts the lateral costophrenic angles.  IMPRESSION: There is no evidence of pneumonia nor significant pulmonary edema. Subtle nodularity projects in the lower third of the right lung and appears unchanged.   Electronically Signed   By: David  Martinique   On: 04/05/2014 09:35   Ct Head Wo Contrast  04/05/2014   CLINICAL DATA:  80 year old female with weakness and dizziness since last night. Nausea and vomiting this morning. Current history of hypertension. Initial encounter.  EXAM: CT HEAD WITHOUT CONTRAST  TECHNIQUE: Contiguous axial images were obtained from the base of the skull through the vertex without intravenous contrast.  COMPARISON:  12/02/2013 and earlier.  FINDINGS: Visualized paranasal sinuses and mastoids are clear. No acute osseous abnormality identified. Visualized orbits and scalp soft tissues are within normal limits.  Calcified atherosclerosis at the skull base. Stable cerebral volume. No ventriculomegaly. No midline shift, mass effect, or evidence of intracranial mass lesion. Chronic confluent cerebral white matter hypodensity, stable. No acute intracranial hemorrhage identified. No evidence of cortically based acute infarction identified.  IMPRESSION: No acute intracranial abnormality. Chronic nonspecific white matter changes.   Electronically Signed   By: Lars Pinks M.D.   On: 04/05/2014 09:51   Mr Brain Wo Contrast  04/05/2014   CLINICAL DATA:  Recurrent dizziness with nausea and vomiting.  EXAM: MRI HEAD WITHOUT CONTRAST  TECHNIQUE: Multiplanar, multiecho pulse sequences of the brain and surrounding structures were obtained without intravenous contrast.  COMPARISON:  Head CT same day.  MRI 10/17/2013  FINDINGS: Diffusion imaging does not show any acute or subacute infarction. There are chronic small-vessel ischemic changes  affecting the pons. There are a few old small vessel cerebellar insults. The cerebral hemispheres show confluent chronic small vessel disease throughout the deep and subcortical white matter. No cortical or large vessel territory infarction. No mass lesion, hemorrhage, hydrocephalus or extra-axial collection. No pituitary mass. No inflammatory sinus disease. No skull or skullbase lesion. The appearance is similar to the study of 10/17/2013.  IMPRESSION: No change since 10/17/2013. Advanced chronic small vessel disease throughout the brain without evidence of acute or reversible process.   Electronically Signed   By: Nelson Chimes M.D.   On: 04/05/2014 18:16     CBC  Recent Labs Lab 04/05/14 1007  WBC 11.5*  HGB 12.5  HCT 39.1  PLT 310  MCV 88.7  MCH 28.3  MCHC 32.0  RDW  15.1  LYMPHSABS 1.0  MONOABS 0.4  EOSABS 0.0  BASOSABS 0.0    Chemistries   Recent Labs Lab 04/05/14 1007  NA 145  K 3.4*  CL 104  CO2 29  GLUCOSE 176*  BUN 14  CREATININE 0.86  CALCIUM 9.7  AST 9  ALT 8  ALKPHOS 71  BILITOT 0.5   ------------------------------------------------------------------------------------------------------------------ estimated creatinine clearance is 67.2 mL/min (by C-G formula based on Cr of 0.86). ------------------------------------------------------------------------------------------------------------------ No results for input(s): HGBA1C in the last 72 hours. ------------------------------------------------------------------------------------------------------------------  Recent Labs  04/06/14 0528  CHOL 208*  HDL 26*  LDLCALC 150*  TRIG 160*  CHOLHDL 8.0   ------------------------------------------------------------------------------------------------------------------ No results for input(s): TSH, T4TOTAL, T3FREE, THYROIDAB in the last 72 hours.  Invalid input(s):  FREET3 ------------------------------------------------------------------------------------------------------------------ No results for input(s): VITAMINB12, FOLATE, FERRITIN, TIBC, IRON, RETICCTPCT in the last 72 hours.  Coagulation profile  Recent Labs Lab 04/05/14 1007  INR 1.06    No results for input(s): DDIMER in the last 72 hours.  Cardiac Enzymes  Recent Labs Lab 04/05/14 1007  TROPONINI <0.30   ------------------------------------------------------------------------------------------------------------------ Invalid input(s): POCBNP     Time Spent in minutes  35   Simona Rocque K M.D on 04/06/2014 at 11:10 AM  Between 7am to 7pm - Pager - [phone]  After 7pm go to [email or web address] - password [email or web address]  And look for the night coverage person covering for me after hours  [facility] Office  [phone]

## 2014-04-06 NOTE — Clinical Social Work Placement (Addendum)
Clinical Social Work Department CLINICAL SOCIAL WORK PLACEMENT NOTE 04/06/2014  Patient:  Katie Dawson, Katie Dawson  Account Number:  [id number] Admit date:  04/05/2014  Clinical Social Worker:  Glendon Axe, CLINICAL SOCIAL WORKER  Date/time:  04/06/2014 02:31 PM  Clinical Social Work is seeking post-discharge placement for this patient at the following level of care:   SKILLED NURSING   (*CSW will update this form in Epic as items are completed)   04/06/2014  Patient/family provided with [place] Department of Clinical Social Work's list of facilities offering this level of care within the geographic area requested by the patient (or if unable, by the patient's family).  04/06/2014  Patient/family informed of their freedom to choose among providers that offer the needed level of care, that participate in Medicare, Medicaid or managed care program needed by the patient, have an available bed and are willing to accept the patient.  04/06/2014  Patient/family informed of [facility]' ownership interest in [facility], as well as of the fact that they are under no obligation to receive care at this facility.  PASARR submitted to EDS on 04/06/2014 PASARR number received on 04/06/2014  FL2 transmitted to all facilities in geographic area requested by pt/family on  04/06/2014 FL2 transmitted to all facilities within larger geographic area on   Patient informed that his/her managed care company has contracts with or will negotiate with  certain facilities, including the following:   YES     Patient/family informed of bed offers received:  04/07/2014  Patient chooses bed at [facility]  Physician recommends and patient chooses bed at    Patient to be transferred [facility]  On 04/07/2014   Patient to be transferred to facility by [facility]  Patient and family notified of transfer on 04/07/2014 Name of family member notified: Pt's daughters, [place] and  [place].   The following physician request were entered in Epic:   Additional Comments:   Glendon Axe, MSW, LCSWA [phone] 04/06/2014 2:32 PM

## 2014-04-06 NOTE — Plan of Care (Signed)
Problem: Progression Outcomes Goal: Progressive activity as tolerated Outcome: Completed/Met Date Met:  04/06/14  Problem: Discharge/Transitional Outcomes Goal: Tolerating diet/TF at goal rate-PEG if inadequate intake Outcome: Completed/Met Date Met:  04/06/14

## 2014-04-06 NOTE — Evaluation (Signed)
Physical Therapy Evaluation Patient Details Name: Katie Dawson MRN: [id number] DOB: [birth year] Today's Date: 04/06/2014   History of Present Illness  80 y.o. female admitted for dizziness and weakness.  Clinical Impression  Pt admitted with above.  MRI is negative for acute infarct. Pt currently with functional limitations due to the deficits listed below (see PT Problem List). Pt has been requiring 24-hour assist at home.  Family is reporting that they are unable to continue to provide this level of assist. Pt will benefit from skilled PT to increase their independence and safety with mobility to allow discharge to SNF.      Follow Up Recommendations SNF;Supervision/Assistance - 24 hour    Equipment Recommendations  None recommended by PT    Recommendations for Other Services       Precautions / Restrictions Precautions Precautions: Fall Restrictions Weight Bearing Restrictions: No      Mobility  Bed Mobility Overal bed mobility: Needs Assistance Bed Mobility: Supine to Sit     Supine to sit: Min assist;HOB elevated     General bed mobility comments: assist to scoot to EOB, use of bed rails  Transfers Overall transfer level: Needs assistance Equipment used: 1 person hand held assist Transfers: Sit to/from Stand Sit to Stand: Min assist            Ambulation/Gait Ambulation/Gait assistance: Min assist Ambulation Distance (Feet): 50 Feet Assistive device: 1 person hand held assist Gait Pattern/deviations: Decreased stride length;Step-through pattern;Shuffle Gait velocity: decreased   General Gait Details: pt would benefit from use of RW  Stairs            Wheelchair Mobility    Modified Rankin (Stroke Patients Only)       Balance Overall balance assessment: Needs assistance Sitting-balance support: No upper extremity supported;Feet supported Sitting balance-Leahy Scale: Good     Standing balance support: Single extremity supported;During  functional activity Standing balance-Leahy Scale: Poor                               Pertinent Vitals/Pain Pain Assessment: No/denies pain    Home Living Family/patient expects to be discharged to:: Private residence Living Arrangements: Children Available Help at Discharge: Family;Available 24 hours/day Type of Home: House Home Access: Stairs to enter Entrance Stairs-Rails: None Entrance Stairs-Number of Steps: 4 Home Layout: Two level;Able to live on main level with bedroom/bathroom Home Equipment: Gilford Rile - 2 wheels;Cane - single point      Prior Function Level of Independence: Needs assistance         Comments: Pt reports she ambulates with SPC; has had daughter living with her since her fall last year but did not require 24/7 (A)     Hand Dominance        Extremity/Trunk Assessment   Upper Extremity Assessment: Defer to OT evaluation           Lower Extremity Assessment: Generalized weakness      Cervical / Trunk Assessment: Normal  Communication   Communication: No difficulties  Cognition Arousal/Alertness: Awake/alert Behavior During Therapy: Flat affect Overall Cognitive Status: History of cognitive impairments - at baseline (A and O x 2)       Memory: Decreased recall of precautions;Decreased short-term memory              General Comments      Exercises        Assessment/Plan    PT Assessment Patient needs  continued PT services  PT Diagnosis Difficulty walking;Generalized weakness   PT Problem List Decreased strength;Decreased activity tolerance;Decreased balance;Decreased mobility;Decreased safety awareness;Decreased knowledge of use of DME  PT Treatment Interventions DME instruction;Gait training;Stair training;Functional mobility training;Therapeutic activities;Therapeutic exercise;Patient/family education;Balance training;Cognitive remediation   PT Goals (Current goals can be found in the Care Plan section) Acute  Rehab PT Goals Patient Stated Goal: return home PT Goal Formulation: Patient unable to participate in goal setting Time For Goal Achievement: 04/20/14 Potential to Achieve Goals: Good    Frequency Min 2X/week   Barriers to discharge        Co-evaluation               End of Session Equipment Utilized During Treatment: Gait belt Activity Tolerance: Patient tolerated treatment well Patient left: in chair;with call bell/phone within reach;with chair alarm set;with family/visitor present Nurse Communication: Mobility status    Functional Assessment Tool Used: clinical judgement Functional Limitation: Mobility: Walking and moving around Mobility: Walking and Moving Around Current Status [phone]): At least 20 percent but less than 40 percent impaired, limited or restricted Mobility: Walking and Moving Around Goal Status [phone]): At least 1 percent but less than 20 percent impaired, limited or restricted    Time: 1100-1120 PT Time Calculation (min): 20 min   Charges:   PT Evaluation $Initial PT Evaluation Tier I: 1 Procedure PT Treatments $Gait Training: 8-22 mins   PT G Codes:   Functional Assessment Tool Used: clinical judgement Functional Limitation: Mobility: Walking and moving around    Lorriane Shire 04/06/2014, 11:34 AM

## 2014-04-06 NOTE — Evaluation (Signed)
Occupational Therapy Evaluation Patient Details Name: Katie Dawson MRN: [id number] DOB: 07-14-1933 Today's Date: 04/06/2014    History of Present Illness 80 y.o. female admitted for dizziness and weakness.   Clinical Impression   Pt is sedentary and dependent in ADL and IADL at home, requiring 24 hour care.  She did not complain of any dizziness this session with mobility or leaning forward for LB dressing.  Pt requires min assist (hand held) for mobility. Her family are not able to provide the level of care pt requires on an ongoing basis.  Recommending SNF.      Follow Up Recommendations  SNF;Supervision/Assistance - 24 hour    Equipment Recommendations  None recommended by OT    Recommendations for Other Services       Precautions / Restrictions Precautions Precautions: Fall Restrictions Weight Bearing Restrictions: No      Mobility Bed Mobility Overal bed mobility: Needs Assistance Bed Mobility: Supine to Sit     Supine to sit: Min assist;HOB elevated     General bed mobility comments: assist to scoot to EOB, use of bed rails  Transfers Overall transfer level: Needs assistance Equipment used: 1 person hand held assist Transfers: Sit to/from Stand Sit to Stand: Min assist              Balance Overall balance assessment: Needs assistance Sitting-balance support: No upper extremity supported;Feet supported Sitting balance-Leahy Scale: Good     Standing balance support: Single extremity supported;During functional activity Standing balance-Leahy Scale: Poor                              ADL Overall ADL's : Needs assistance/impaired Eating/Feeding: Independent;Sitting   Grooming: Wash/dry hands;Min guard;Standing   Upper Body Bathing: Minimal assitance;Sitting   Lower Body Bathing: Moderate assistance;Sit to/from stand   Upper Body Dressing : Minimal assistance;Sitting   Lower Body Dressing: Moderate assistance;Sit to/from stand    Toilet Transfer: Minimal assistance;Ambulation;Comfort height toilet;Grab bars   Toileting- Clothing Manipulation and Hygiene: Maximal assistance;Sit to/from stand       Functional mobility during ADLs: Minimal assistance (hand held)       Vision                     Perception     Praxis      Pertinent Vitals/Pain Pain Assessment: No/denies pain     Hand Dominance Right   Extremity/Trunk Assessment Upper Extremity Assessment Upper Extremity Assessment: Generalized weakness   Lower Extremity Assessment Lower Extremity Assessment: Generalized weakness   Cervical / Trunk Assessment Cervical / Trunk Assessment: Normal   Communication Communication Communication: No difficulties   Cognition Arousal/Alertness: Awake/alert Behavior During Therapy: Flat affect Overall Cognitive Status: History of cognitive impairments - at baseline       Memory: Decreased recall of precautions;Decreased short-term memory             General Comments       Exercises       Shoulder Instructions      Home Living Family/patient expects to be discharged to:: Private residence Living Arrangements: Children Available Help at Discharge: Family;Available 24 hours/day Type of Home: House Home Access: Stairs to enter [employer] of Steps: 4 Entrance Stairs-Rails: None Home Layout: Two level;Able to live on main level with bedroom/bathroom     Bathroom Shower/Tub: Teacher, early years/pre: Standard     Home Equipment: Environmental consultant - 2 wheels;Cane -  single point          Prior Functioning/Environment Level of Independence: Needs assistance  Gait / Transfers Assistance Needed: walks with cane or no device in home ADL's / Homemaking Assistance Needed: Assisted for bathing, dressing, toileting, meal prep and housekeeping per family friend.   Comments: Lives with daughter and son in law, but is resistant to their assistance with ADL.  Pt is incontinent  of bladder, but does not always keep herself clean. Pt is a poor historian.    OT Diagnosis: Generalized weakness;Cognitive deficits   OT Problem List: Decreased strength;Decreased activity tolerance;Impaired balance (sitting and/or standing);Decreased cognition;Decreased knowledge of use of DME or AE;Obesity;Decreased safety awareness   OT Treatment/Interventions:      OT Goals(Current goals can be found in the care plan section) Acute Rehab OT Goals Patient Stated Goal: return home  OT Frequency:     Barriers to D/C:            Co-evaluation              End of Session    Activity Tolerance: Patient tolerated treatment well Patient left: in chair;with call bell/phone within reach;with chair alarm set;with family/visitor present   Time: 3419-6222 OT Time Calculation (min): 23 min Charges:  OT General Charges $OT Visit: 1 Procedure OT Evaluation $Initial OT Evaluation Tier I: 1 Procedure OT Treatments $Self Care/Home Management : 8-22 mins G-Codes: OT G-codes **NOT FOR INPATIENT CLASS** Functional Assessment Tool Used: clinical judgement Functional Limitation: Self care Self Care Current Status (L7989): At least 40 percent but less than 60 percent impaired, limited or restricted Self Care Goal Status (Q1194): At least 40 percent but less than 60 percent impaired, limited or restricted Self Care Discharge Status [phone]): At least 40 percent but less than 60 percent impaired, limited or restricted  [name] 04/06/2014, 12:21 PM  [phone]

## 2014-04-06 NOTE — Progress Notes (Signed)
UR completed 

## 2014-04-06 NOTE — Progress Notes (Signed)
Pt daughter does not want the patient to be discharged home at discharge time. She suggested CIR or SNF will be a good place for her.

## 2014-04-06 NOTE — Clinical Social Work Psychosocial (Signed)
Clinical Social Work Department BRIEF PSYCHOSOCIAL ASSESSMENT 04/06/2014  Patient:  Katie Dawson, Katie Dawson     Account Number:  [id number]     Admit date:  04/05/2014  Clinical Social Worker:  Glendon Axe, CLINICAL SOCIAL WORKER  Date/Time:  04/06/2014 02:15 PM  Referred by:  Physician  Date Referred:  04/06/2014 Referred for  SNF Placement   Other Referral:   Interview type:  Other - See comment Other interview type:   Patient and pt's daughter, Katie Dawson.    PSYCHOSOCIAL DATA Living Status:  FAMILY Admitted from facility:   Level of care:   Primary support name:  Katie Dawson and Katie Dawson Primary support relationship to patient:  CHILD, ADULT Degree of support available:   Strong    CURRENT CONCERNS Current Concerns  Post-Acute Placement   Other Concerns:    SOCIAL WORK ASSESSMENT / PLAN Clinical Social Worker spoke with patient and pt's family at bedside in reference to post-acute placement for SNF. CSW explained SNF process and provided family with SNF list. Per RN, pt's daughter, Katie Dawson reported she would like pt placed at SNF to recieve PT/OT. Pt aware of SNF plan and stated she will benefit from SNF placement. Pt has strong family support. CSW spoke with pt's daughter, Katie Dawson via telephone and she reported pt was at [facility] in the past however she is requesting pt to be placed at a specific facility. CSW continues to follow pt and facilitate pt's discharge needs once medically stable.   Assessment/plan status:  Psychosocial Support/Ongoing Assessment of Needs Other assessment/ plan:   Information/referral to community resources:   SNF list/information provided.    PATIENT'S/FAMILY'S RESPONSE TO PLAN OF CARE: Pt lying in bed alert and oriented X2. Pt eating and watching tv. Pt also stated she is feeling better and enjoys family visits. Family is present during interview. Pt and pt's daughters all agreeable to SNF plan.     Glendon Axe, MSW, LCSWA [phone] 04/06/2014 2:30 PM

## 2014-04-07 LAB — GLUCOSE, CAPILLARY
GLUCOSE-CAPILLARY: 119 mg/dL — AB (ref 70–99)
GLUCOSE-CAPILLARY: 155 mg/dL — AB (ref 70–99)

## 2014-04-07 MED ORDER — MECLIZINE HCL 25 MG PO TABS: 25.0000 mg | ORAL_TABLET | Freq: Three times a day (TID) | ORAL | Status: DC | PRN

## 2014-04-07 MED ORDER — ASPIRIN EC 81 MG PO TBEC: 81.0000 mg | DELAYED_RELEASE_TABLET | Freq: Every day | ORAL | Status: DC

## 2014-04-07 MED ORDER — INSULIN ASPART 100 UNIT/ML ~~LOC~~ SOLN: SUBCUTANEOUS | Status: DC

## 2014-04-07 MED ORDER — HYDRALAZINE HCL 25 MG PO TABS: 25.0000 mg | ORAL_TABLET | Freq: Three times a day (TID) | ORAL | Status: DC

## 2014-04-07 NOTE — Progress Notes (Signed)
Pt. Discharged to [facility] report given to receiving nurse Summer.

## 2014-04-07 NOTE — Clinical Social Work Note (Signed)
Clinical Social Worker facilitated patient discharge including contacting patient family and facility to confirm patient discharge plans.  Clinical information faxed to facility and family agreeable with plan.  CSW arranged ambulance transport via [facility] to [facility].  RN to call report prior to discharge.  Clinical Social Worker will sign off for now as social work intervention is no longer needed. Please consult [place] again if new need arises.  Glendon Axe, MSW, LCSWA [phone] 04/07/2014 11:36 AM

## 2014-04-07 NOTE — Progress Notes (Signed)
Clinical Social Worker spoke with pt's daughter, Davy Pique via telephone to review bed offers. Pt's daughter chooses bed at [facility] and reported pt has been a resident of [facility] in the past.   Patient discussed in progression this morning for discharge today.   CSW continues to follow pt for support and to facilitate pt's discharge needs.  Glendon Axe, MSW, LCSWA [phone] 04/07/2014 10:24 AM

## 2014-04-07 NOTE — Discharge Instructions (Signed)
Follow with Primary MD Cathlean Cower, MD in 7 days   Get CBC, CMP, 2 view Chest X ray checked  by Primary MD next visit.    Activity: As tolerated with Full fall precautions use walker/cane & assistance as needed   Disposition SNF   Diet: Heart Healthy Low Carb with feeding assistance and aspiration precautions as needed.  For Heart failure patients - Check your Weight same time everyday, if you gain over 2 pounds, or you develop in leg swelling, experience more shortness of breath or chest pain, call your Primary MD immediately. Follow Cardiac Low Salt Diet and 1.8 lit/day fluid restriction.   On your next visit with your primary care physician please Get Medicines reviewed and adjusted.   Please request your Prim.MD to go over all Hospital Tests and Procedure/Radiological results at the follow up, please get all Hospital records sent to your Prim MD by signing hospital release before you go home.   If you experience worsening of your admission symptoms, develop shortness of breath, life threatening emergency, suicidal or homicidal thoughts you must seek medical attention immediately by calling 911 or calling your MD immediately  if symptoms less severe.  You Must read complete instructions/literature along with all the possible adverse reactions/side effects for all the Medicines you take and that have been prescribed to you. Take any new Medicines after you have completely understood and accpet all the possible adverse reactions/side effects.   Do not drive, operating heavy machinery, perform activities at heights, swimming or participation in water activities or provide baby sitting services if your were admitted for syncope or siezures until you have seen by Primary MD or a Neurologist and advised to do so again.  Do not drive when taking Pain medications.    Do not take more than prescribed Pain, Sleep and Anxiety Medications  Special Instructions: If you have smoked or chewed  Tobacco  in the last 2 yrs please stop smoking, stop any regular Alcohol  and or any Recreational drug use.  Wear Seat belts while driving.   Please note  You were cared for by a hospitalist during your hospital stay. If you have any questions about your discharge medications or the care you received while you were in the hospital after you are discharged, you can call the unit and asked to speak with the hospitalist on call if the hospitalist that took care of you is not available. Once you are discharged, your primary care physician will handle any further medical issues. Please note that NO REFILLS for any discharge medications will be authorized once you are discharged, as it is imperative that you return to your primary care physician (or establish a relationship with a primary care physician if you do not have one) for your aftercare needs so that they can reassess your need for medications and monitor your lab values.

## 2014-04-07 NOTE — Discharge Summary (Signed)
Katie Dawson, is a 80 y.o. female  DOB [birth year]  MRN [id number].  Admission date:  04/05/2014  Admitting Physician  Ripudeep Krystal Eaton, MD  Discharge Date:  04/07/2014   Primary MD  Cathlean Cower, MD  Recommendations for primary care physician for things to follow:   Monitor secondary to his factors for stroke, monitor vertigo   Admission Diagnosis  Dizziness [R42]   Discharge Diagnosis  Dizziness [R42]    Principal Problem:   Dizziness Active Problems:   Insulin dependent diabetes mellitus   Essential hypertension   GERD   Dementia      Past Medical History  Diagnosis Date  . DIABETES MELLITUS, TYPE II 04/22/2007  . HYPERLIPIDEMIA 04/22/2007  . GOUT 04/22/2007  . ANEMIA-IRON DEFICIENCY 04/22/2007  . ANXIETY 04/22/2007  . OBSTRUCTIVE SLEEP APNEA 04/22/2007  . HYPERTENSION 04/22/2007  . GERD 04/22/2007  . RENAL INSUFFICIENCY 04/22/2007  . 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 FOOT PAIN, LEFT 12/23/2007  . OSTEOPENIA 02/12/2009  . INTERMITTENT VERTIGO 04/20/2009  . HYPERSOMNIA 01/11/2009  . COLONIC POLYPS, HX OF 04/22/2007  . NEPHRECTOMY, HX OF 04/22/2007  . Urinary tract infection     hx of  . Right fibular fracture July 2013  . Dementia     Past Surgical History  Procedure Laterality Date  . S/p right nephrectomy    . S/p parathyroid surgury    . Orif wrist fracture  01/02/2012    Procedure: OPEN REDUCTION INTERNAL FIXATION (ORIF) WRIST FRACTURE;  Surgeon: Schuyler Amor, MD;  Location: [facility];  Service: Orthopedics;  Laterality: Right;  Open Reduction Internal Fixation Right Distal Radius        History of present illness and  Hospital Course:     Kindly see H&P for history of present illness and admission details, please review complete Labs, Consult reports and Test reports for all details  in brief  HPI  from the history and physical done on the day of admission  Patient is a 80 year old female with history of diabetes mellitus, insulin-dependent, hyperlipidemia, OSA, hypertension, GERD, dementia was brought to ED from home complaining of weakness and dizziness. Patient is a poor historian due to dementia. History was obtained from ED records. Patient had been complaining of weakness and dizziness that started last night. This morning she had an episode of nausea and vomiting. Patient's BP was noted by EMS as 190/110. EDP tried to ambulate the patient however she was found to be weak, slow, shuffling and still dizzy. Hospitalist service was requested for admission. Patient endorses dizziness and states it happens 'off and on', denied any diarrhea.   Hospital Course   1.Dizziness with nausea vomiting likely vertigo. MRI brain ruled out posterior circulation CVA. Per neurology no further testing.on MRI chronic small vessel disease therefore placed on 81 mg of aspirin, will give meclizine when necessary for vertigo, will require continued PT at SNF as she is high fall risk. Currently symptom free. Not orthostatic.   2. Accelerated Hypertension. Poor control. Added hydralazine  to home regimen for better control monitor blood pressure just medicates as needed.   3. DM type II. Continue home regimen of Lantus along with oral hypoglycemic, added sliding scale.  Lab Results  Component Value Date   HGBA1C 6.2* 04/05/2014     4. Low potassium was replaced check BMP in 3-4 days.   5. Dementia. On risperidone continue.  Discharge Condition:    Follow UP  Follow-up Information    Follow up with Cathlean Cower, MD. Schedule an appointment as soon as possible for a visit in 1 week.   Specialties:  Internal Medicine, Radiology   Contact information:   [place] [address] [phone]       Follow up with [facility] In 1 week.   Contact  information:   [address] [place] [address] [phone]        Discharge Instructions  and  Discharge Medications          Discharge Instructions    Discharge instructions    Complete by:  As directed   Follow with Primary MD Cathlean Cower, MD in 7 days   Get CBC, CMP, 2 view Chest X ray checked  by Primary MD next visit.    Activity: As tolerated with Full fall precautions use walker/cane & assistance as needed   Disposition SNF   Diet: Heart Healthy Low Carb with feeding assistance and aspiration precautions as needed.  For Heart failure patients - Check your Weight same time everyday, if you gain over 2 pounds, or you develop in leg swelling, experience more shortness of breath or chest pain, call your Primary MD immediately. Follow Cardiac Low Salt Diet and 1.8 lit/day fluid restriction.   On your next visit with your primary care physician please Get Medicines reviewed and adjusted.   Please request your Prim.MD to go over all Hospital Tests and Procedure/Radiological results at the follow up, please get all Hospital records sent to your Prim MD by signing hospital release before you go home.   If you experience worsening of your admission symptoms, develop shortness of breath, life threatening emergency, suicidal or homicidal thoughts you must seek medical attention immediately by calling 911 or calling your MD immediately  if symptoms less severe.  You Must read complete instructions/literature along with all the possible adverse reactions/side effects for all the Medicines you take and that have been prescribed to you. Take any new Medicines after you have completely understood and accpet all the possible adverse reactions/side effects.   Do not drive, operating heavy machinery, perform activities at heights, swimming or participation in water activities or provide baby sitting services if your were admitted for syncope or siezures until  you have seen by Primary MD or a Neurologist and advised to do so again.  Do not drive when taking Pain medications.    Do not take more than prescribed Pain, Sleep and Anxiety Medications  Special Instructions: If you have smoked or chewed Tobacco  in the last 2 yrs please stop smoking, stop any regular Alcohol  and or any Recreational drug use.  Wear Seat belts while driving.   Please note  You were cared for by a hospitalist during your hospital stay. If you have any questions about your discharge medications or the care you received while you were in the hospital after you are discharged, you can call the unit and asked to speak with the hospitalist on call if  the hospitalist that took care of you is not available. Once you are discharged, your primary care physician will handle any further medical issues. Please note that NO REFILLS for any discharge medications will be authorized once you are discharged, as it is imperative that you return to your primary care physician (or establish a relationship with a primary care physician if you do not have one) for your aftercare needs so that they can reassess your need for medications and monitor your lab values.            Medication List    STOP taking these medications        cephALEXin 500 MG capsule  Commonly known as:  KEFLEX     pravastatin 40 MG tablet  Commonly known as:  PRAVACHOL      TAKE these medications        amLODipine 10 MG tablet  Commonly known as:  NORVASC  Take 10 mg by mouth daily.     aspirin EC 81 MG tablet  Take 1 tablet (81 mg total) by mouth daily.     BD PEN NEEDLE NANO U/F 32G X 4 MM Misc  Generic drug:  Insulin Pen Needle  1 Device by Does not apply route daily as needed (insulin).     BD PEN NEEDLE NANO U/F 32G X 4 MM Misc  Generic drug:  Insulin Pen Needle  USE WITH LANTUS DAILY.     busPIRone 7.5 MG tablet  Commonly known as:  BUSPAR  Take 7.5 mg by mouth 3 (three) times daily as  needed (nerves).     citalopram 10 MG tablet  Commonly known as:  CELEXA  Take 10 mg by mouth daily.     cloNIDine 0.1 MG tablet  Commonly known as:  CATAPRES  Take 0.1 mg by mouth 2 (two) times daily.     donepezil 10 MG tablet  Commonly known as:  ARICEPT  Take 1 tablet (10 mg total) by mouth at bedtime.     gabapentin 100 MG capsule  Commonly known as:  NEURONTIN  Take 100 mg by mouth 3 (three) times daily as needed.     glipiZIDE 5 MG 24 hr tablet  Commonly known as:  GLUCOTROL XL  Take 1 tablet (5 mg total) by mouth daily with breakfast.     glucose blood test strip  Commonly known as:  BAYER CONTOUR NEXT TEST  Use as instructed     hydrALAZINE 25 MG tablet  Commonly known as:  APRESOLINE  Take 1 tablet (25 mg total) by mouth every 8 (eight) hours.     insulin aspart 100 UNIT/ML injection  Commonly known as:  NOVOLOG  - Before each meal 3 times a day, 140-199 - 2 units, 200-250 - 4 units, 251-299 - 6 units,  300-349 - 8 units,  350 or above 10 units.  - Dispense syringes and needles as needed, Ok to switch to PEN if approved.     insulin glargine 100 UNIT/ML injection  Commonly known as:  LANTUS  Inject 60 Units into the skin at bedtime.     insulin glargine 100 UNIT/ML injection  Commonly known as:  LANTUS  Inject 0.6 mLs (60 Units total) into the skin at bedtime.     isosorbide mononitrate 60 MG 24 hr tablet  Commonly known as:  IMDUR  Take 60 mg by mouth at bedtime.     labetalol 200 MG tablet  Commonly known as:  NORMODYNE  Take 200 mg by mouth 2 (two) times daily.     Lancets Misc  Use as directed 1 per day     meclizine 25 MG tablet  Commonly known as:  ANTIVERT  Take 1 tablet (25 mg total) by mouth 3 (three) times daily as needed.     risperiDONE 0.25 MG tablet  Commonly known as:  RISPERDAL  1 tab by mouth in the AM, then 4 tabs by mouth in the PM          Diet and Activity recommendation: See Discharge Instructions above   Consults  obtained - Neuro   Major procedures and Radiology Reports - PLEASE review detailed and final reports for all details, in brief -       Dg Chest 2 View  04/05/2014   CLINICAL DATA:  Dizziness hypertension nausea and weakness  EXAM: CHEST  2 VIEW  COMPARISON:  Portable chest x-ray of Oct 16, 2013  FINDINGS: The lungs are slightly less well inflated today. Subtle nodularity projects in the lower third of the right lung. There is soft tissue fullness in the hilar regions bilaterally which is stable. The cardiac silhouette is enlarged. The pulmonary vascularity is not engorged. A trace of pleural fluid. Blunts the lateral costophrenic angles.  IMPRESSION: There is no evidence of pneumonia nor significant pulmonary edema. Subtle nodularity projects in the lower third of the right lung and appears unchanged.   Electronically Signed   By: David  Martinique   On: 04/05/2014 09:35   Ct Head Wo Contrast  04/05/2014   CLINICAL DATA:  80 year old female with weakness and dizziness since last night. Nausea and vomiting this morning. Current history of hypertension. Initial encounter.  EXAM: CT HEAD WITHOUT CONTRAST  TECHNIQUE: Contiguous axial images were obtained from the base of the skull through the vertex without intravenous contrast.  COMPARISON:  12/02/2013 and earlier.  FINDINGS: Visualized paranasal sinuses and mastoids are clear. No acute osseous abnormality identified. Visualized orbits and scalp soft tissues are within normal limits.  Calcified atherosclerosis at the skull base. Stable cerebral volume. No ventriculomegaly. No midline shift, mass effect, or evidence of intracranial mass lesion. Chronic confluent cerebral white matter hypodensity, stable. No acute intracranial hemorrhage identified. No evidence of cortically based acute infarction identified.  IMPRESSION: No acute intracranial abnormality. Chronic nonspecific white matter changes.   Electronically Signed   By: Lars Pinks M.D.   On: 04/05/2014  09:51   Mr Brain Wo Contrast  04/05/2014   CLINICAL DATA:  Recurrent dizziness with nausea and vomiting.  EXAM: MRI HEAD WITHOUT CONTRAST  TECHNIQUE: Multiplanar, multiecho pulse sequences of the brain and surrounding structures were obtained without intravenous contrast.  COMPARISON:  Head CT same day.  MRI 10/17/2013  FINDINGS: Diffusion imaging does not show any acute or subacute infarction. There are chronic small-vessel ischemic changes affecting the pons. There are a few old small vessel cerebellar insults. The cerebral hemispheres show confluent chronic small vessel disease throughout the deep and subcortical white matter. No cortical or large vessel territory infarction. No mass lesion, hemorrhage, hydrocephalus or extra-axial collection. No pituitary mass. No inflammatory sinus disease. No skull or skullbase lesion. The appearance is similar to the study of 10/17/2013.  IMPRESSION: No change since 10/17/2013. Advanced chronic small vessel disease throughout the brain without evidence of acute or reversible process.   Electronically Signed   By: Nelson Chimes M.D.   On: 04/05/2014 18:16    Micro Results      No  results found for this or any previous visit (from the past 240 hour(s)).     Today   Subjective:   Katie Dawson today has no headache,no chest abdominal pain,no new weakness tingling or numbness, feels much better.  Objective:   Blood pressure 137/59, pulse 55, temperature 98.4 F (36.9 C), temperature source Oral, resp. rate 20, height 5\' 6"  (1.676 m), weight 108.5 kg (239 lb 3.2 oz), SpO2 98 %.   Intake/Output Summary (Last 24 hours) at 04/07/14 1048 Last data filed at 04/06/14 1250  Gross per 24 hour  Intake    240 ml  Output      0 ml  Net    240 ml    Exam Awake Alert, Oriented x 3, No new F.N deficits, Normal affect [place].AT,PERRAL Supple Neck,No JVD, No cervical lymphadenopathy appriciated.  Symmetrical Chest wall movement, Good air movement bilaterally,  CTAB RRR,No Gallops,Rubs or new Murmurs, No Parasternal Heave +ve B.Sounds, Abd Soft, Non tender, No organomegaly appriciated, No rebound -guarding or rigidity. No Cyanosis, Clubbing or edema, No new Rash or bruise  Data Review   CBC w Diff:  Lab Results  Component Value Date   WBC 11.5* 04/05/2014   HGB 12.5 04/05/2014   HCT 39.1 04/05/2014   PLT 310 04/05/2014   LYMPHOPCT 9* 04/05/2014   MONOPCT 4 04/05/2014   EOSPCT 0 04/05/2014   BASOPCT 0 04/05/2014    CMP:  Lab Results  Component Value Date   NA 145 04/05/2014   K 3.4* 04/05/2014   CL 104 04/05/2014   CO2 29 04/05/2014   BUN 14 04/05/2014   CREATININE 0.86 04/05/2014   PROT 6.9 04/05/2014   ALBUMIN 3.0* 04/05/2014   BILITOT 0.5 04/05/2014   ALKPHOS 71 04/05/2014   AST 9 04/05/2014   ALT 8 04/05/2014  .   Total Time in preparing paper work, data evaluation and todays exam - 35 minutes  Thurnell Lose M.D on 04/07/2014 at 10:48 AM  [facility] Office  [phone]

## 2014-06-12 DIAGNOSIS — G9340 Encephalopathy, unspecified: Secondary | ICD-10-CM | POA: Diagnosis not present

## 2014-06-15 DIAGNOSIS — R21 Rash and other nonspecific skin eruption: Secondary | ICD-10-CM | POA: Diagnosis not present

## 2014-06-15 DIAGNOSIS — E119 Type 2 diabetes mellitus without complications: Secondary | ICD-10-CM | POA: Diagnosis not present

## 2014-06-15 DIAGNOSIS — I10 Essential (primary) hypertension: Secondary | ICD-10-CM | POA: Diagnosis not present

## 2014-07-08 DIAGNOSIS — M25572 Pain in left ankle and joints of left foot: Secondary | ICD-10-CM | POA: Diagnosis not present

## 2014-07-12 DIAGNOSIS — I10 Essential (primary) hypertension: Secondary | ICD-10-CM | POA: Diagnosis not present

## 2014-07-12 DIAGNOSIS — F419 Anxiety disorder, unspecified: Secondary | ICD-10-CM | POA: Diagnosis not present

## 2014-07-12 DIAGNOSIS — E119 Type 2 diabetes mellitus without complications: Secondary | ICD-10-CM | POA: Diagnosis not present

## 2014-07-12 DIAGNOSIS — F0390 Unspecified dementia without behavioral disturbance: Secondary | ICD-10-CM | POA: Diagnosis not present

## 2014-07-13 DIAGNOSIS — G9340 Encephalopathy, unspecified: Secondary | ICD-10-CM | POA: Diagnosis not present

## 2014-08-08 ENCOUNTER — Ambulatory Visit: Payer: PRIVATE HEALTH INSURANCE | Admitting: Internal Medicine

## 2014-08-11 DIAGNOSIS — G9340 Encephalopathy, unspecified: Secondary | ICD-10-CM | POA: Diagnosis not present

## 2014-08-15 DIAGNOSIS — I10 Essential (primary) hypertension: Secondary | ICD-10-CM | POA: Diagnosis not present

## 2014-08-15 DIAGNOSIS — G629 Polyneuropathy, unspecified: Secondary | ICD-10-CM | POA: Diagnosis not present

## 2014-08-15 DIAGNOSIS — E119 Type 2 diabetes mellitus without complications: Secondary | ICD-10-CM | POA: Diagnosis not present

## 2014-08-15 DIAGNOSIS — F0390 Unspecified dementia without behavioral disturbance: Secondary | ICD-10-CM | POA: Diagnosis not present

## 2014-08-15 DIAGNOSIS — F329 Major depressive disorder, single episode, unspecified: Secondary | ICD-10-CM | POA: Diagnosis not present

## 2014-08-24 DIAGNOSIS — E784 Other hyperlipidemia: Secondary | ICD-10-CM | POA: Diagnosis not present

## 2014-08-24 DIAGNOSIS — I10 Essential (primary) hypertension: Secondary | ICD-10-CM | POA: Diagnosis not present

## 2014-08-24 DIAGNOSIS — E538 Deficiency of other specified B group vitamins: Secondary | ICD-10-CM | POA: Diagnosis not present

## 2014-08-24 DIAGNOSIS — E559 Vitamin D deficiency, unspecified: Secondary | ICD-10-CM | POA: Diagnosis not present

## 2014-08-24 DIAGNOSIS — N183 Chronic kidney disease, stage 3 (moderate): Secondary | ICD-10-CM | POA: Diagnosis not present

## 2014-09-11 DIAGNOSIS — G9340 Encephalopathy, unspecified: Secondary | ICD-10-CM | POA: Diagnosis not present

## 2014-09-19 DIAGNOSIS — E119 Type 2 diabetes mellitus without complications: Secondary | ICD-10-CM | POA: Diagnosis not present

## 2014-09-19 DIAGNOSIS — F329 Major depressive disorder, single episode, unspecified: Secondary | ICD-10-CM | POA: Diagnosis not present

## 2014-09-19 DIAGNOSIS — I10 Essential (primary) hypertension: Secondary | ICD-10-CM | POA: Diagnosis not present

## 2014-09-19 DIAGNOSIS — F0390 Unspecified dementia without behavioral disturbance: Secondary | ICD-10-CM | POA: Diagnosis not present

## 2014-09-19 DIAGNOSIS — G629 Polyneuropathy, unspecified: Secondary | ICD-10-CM | POA: Diagnosis not present

## 2014-09-28 DIAGNOSIS — E119 Type 2 diabetes mellitus without complications: Secondary | ICD-10-CM | POA: Diagnosis not present

## 2014-09-28 DIAGNOSIS — H2513 Age-related nuclear cataract, bilateral: Secondary | ICD-10-CM | POA: Diagnosis not present

## 2014-09-28 DIAGNOSIS — H25013 Cortical age-related cataract, bilateral: Secondary | ICD-10-CM | POA: Diagnosis not present

## 2014-09-28 DIAGNOSIS — H53002 Unspecified amblyopia, left eye: Secondary | ICD-10-CM | POA: Diagnosis not present

## 2014-10-11 DIAGNOSIS — G9340 Encephalopathy, unspecified: Secondary | ICD-10-CM | POA: Diagnosis not present

## 2014-10-17 DIAGNOSIS — E119 Type 2 diabetes mellitus without complications: Secondary | ICD-10-CM | POA: Diagnosis not present

## 2014-10-17 DIAGNOSIS — F329 Major depressive disorder, single episode, unspecified: Secondary | ICD-10-CM | POA: Diagnosis not present

## 2014-10-17 DIAGNOSIS — G629 Polyneuropathy, unspecified: Secondary | ICD-10-CM | POA: Diagnosis not present

## 2014-10-17 DIAGNOSIS — I10 Essential (primary) hypertension: Secondary | ICD-10-CM | POA: Diagnosis not present

## 2014-10-17 DIAGNOSIS — F0390 Unspecified dementia without behavioral disturbance: Secondary | ICD-10-CM | POA: Diagnosis not present

## 2014-11-01 DIAGNOSIS — I10 Essential (primary) hypertension: Secondary | ICD-10-CM | POA: Diagnosis not present

## 2014-11-02 DIAGNOSIS — I10 Essential (primary) hypertension: Secondary | ICD-10-CM | POA: Diagnosis not present

## 2014-11-03 DIAGNOSIS — I10 Essential (primary) hypertension: Secondary | ICD-10-CM | POA: Diagnosis not present

## 2014-11-04 DIAGNOSIS — I10 Essential (primary) hypertension: Secondary | ICD-10-CM | POA: Diagnosis not present

## 2014-11-05 DIAGNOSIS — I10 Essential (primary) hypertension: Secondary | ICD-10-CM | POA: Diagnosis not present

## 2014-11-06 DIAGNOSIS — I10 Essential (primary) hypertension: Secondary | ICD-10-CM | POA: Diagnosis not present

## 2014-11-07 DIAGNOSIS — I10 Essential (primary) hypertension: Secondary | ICD-10-CM | POA: Diagnosis not present

## 2014-11-08 DIAGNOSIS — I10 Essential (primary) hypertension: Secondary | ICD-10-CM | POA: Diagnosis not present

## 2014-11-09 DIAGNOSIS — I10 Essential (primary) hypertension: Secondary | ICD-10-CM | POA: Diagnosis not present

## 2014-11-10 DIAGNOSIS — I10 Essential (primary) hypertension: Secondary | ICD-10-CM | POA: Diagnosis not present

## 2014-11-11 DIAGNOSIS — G9340 Encephalopathy, unspecified: Secondary | ICD-10-CM | POA: Diagnosis not present

## 2014-11-11 DIAGNOSIS — I10 Essential (primary) hypertension: Secondary | ICD-10-CM | POA: Diagnosis not present

## 2014-11-12 DIAGNOSIS — I10 Essential (primary) hypertension: Secondary | ICD-10-CM | POA: Diagnosis not present

## 2014-11-13 DIAGNOSIS — I10 Essential (primary) hypertension: Secondary | ICD-10-CM | POA: Diagnosis not present

## 2014-11-14 DIAGNOSIS — F419 Anxiety disorder, unspecified: Secondary | ICD-10-CM | POA: Diagnosis not present

## 2014-11-14 DIAGNOSIS — I10 Essential (primary) hypertension: Secondary | ICD-10-CM | POA: Diagnosis not present

## 2014-11-14 DIAGNOSIS — E119 Type 2 diabetes mellitus without complications: Secondary | ICD-10-CM | POA: Diagnosis not present

## 2014-11-14 DIAGNOSIS — F0390 Unspecified dementia without behavioral disturbance: Secondary | ICD-10-CM | POA: Diagnosis not present

## 2014-11-14 DIAGNOSIS — G629 Polyneuropathy, unspecified: Secondary | ICD-10-CM | POA: Diagnosis not present

## 2014-11-15 DIAGNOSIS — I10 Essential (primary) hypertension: Secondary | ICD-10-CM | POA: Diagnosis not present

## 2014-11-16 DIAGNOSIS — I10 Essential (primary) hypertension: Secondary | ICD-10-CM | POA: Diagnosis not present

## 2014-11-17 DIAGNOSIS — I10 Essential (primary) hypertension: Secondary | ICD-10-CM | POA: Diagnosis not present

## 2014-11-18 DIAGNOSIS — I10 Essential (primary) hypertension: Secondary | ICD-10-CM | POA: Diagnosis not present

## 2014-11-19 DIAGNOSIS — I10 Essential (primary) hypertension: Secondary | ICD-10-CM | POA: Diagnosis not present

## 2014-11-20 DIAGNOSIS — I10 Essential (primary) hypertension: Secondary | ICD-10-CM | POA: Diagnosis not present

## 2014-11-21 DIAGNOSIS — I10 Essential (primary) hypertension: Secondary | ICD-10-CM | POA: Diagnosis not present

## 2014-11-21 DIAGNOSIS — F419 Anxiety disorder, unspecified: Secondary | ICD-10-CM | POA: Diagnosis not present

## 2014-11-21 DIAGNOSIS — F0390 Unspecified dementia without behavioral disturbance: Secondary | ICD-10-CM | POA: Diagnosis not present

## 2014-11-21 DIAGNOSIS — G629 Polyneuropathy, unspecified: Secondary | ICD-10-CM | POA: Diagnosis not present

## 2014-11-21 DIAGNOSIS — E119 Type 2 diabetes mellitus without complications: Secondary | ICD-10-CM | POA: Diagnosis not present

## 2014-11-22 DIAGNOSIS — I10 Essential (primary) hypertension: Secondary | ICD-10-CM | POA: Diagnosis not present

## 2014-11-23 DIAGNOSIS — I10 Essential (primary) hypertension: Secondary | ICD-10-CM | POA: Diagnosis not present

## 2014-11-24 DIAGNOSIS — I10 Essential (primary) hypertension: Secondary | ICD-10-CM | POA: Diagnosis not present

## 2014-11-25 DIAGNOSIS — I10 Essential (primary) hypertension: Secondary | ICD-10-CM | POA: Diagnosis not present

## 2014-11-26 DIAGNOSIS — I10 Essential (primary) hypertension: Secondary | ICD-10-CM | POA: Diagnosis not present

## 2014-11-27 DIAGNOSIS — I10 Essential (primary) hypertension: Secondary | ICD-10-CM | POA: Diagnosis not present

## 2014-11-28 DIAGNOSIS — I10 Essential (primary) hypertension: Secondary | ICD-10-CM | POA: Diagnosis not present

## 2014-11-29 DIAGNOSIS — I10 Essential (primary) hypertension: Secondary | ICD-10-CM | POA: Diagnosis not present

## 2014-11-30 DIAGNOSIS — I10 Essential (primary) hypertension: Secondary | ICD-10-CM | POA: Diagnosis not present

## 2014-12-01 DIAGNOSIS — I10 Essential (primary) hypertension: Secondary | ICD-10-CM | POA: Diagnosis not present

## 2014-12-02 DIAGNOSIS — I10 Essential (primary) hypertension: Secondary | ICD-10-CM | POA: Diagnosis not present

## 2014-12-03 DIAGNOSIS — I10 Essential (primary) hypertension: Secondary | ICD-10-CM | POA: Diagnosis not present

## 2014-12-04 DIAGNOSIS — I10 Essential (primary) hypertension: Secondary | ICD-10-CM | POA: Diagnosis not present

## 2014-12-05 DIAGNOSIS — I10 Essential (primary) hypertension: Secondary | ICD-10-CM | POA: Diagnosis not present

## 2014-12-06 DIAGNOSIS — I10 Essential (primary) hypertension: Secondary | ICD-10-CM | POA: Diagnosis not present

## 2014-12-07 DIAGNOSIS — I10 Essential (primary) hypertension: Secondary | ICD-10-CM | POA: Diagnosis not present

## 2014-12-08 DIAGNOSIS — I10 Essential (primary) hypertension: Secondary | ICD-10-CM | POA: Diagnosis not present

## 2014-12-09 DIAGNOSIS — I10 Essential (primary) hypertension: Secondary | ICD-10-CM | POA: Diagnosis not present

## 2014-12-10 DIAGNOSIS — I10 Essential (primary) hypertension: Secondary | ICD-10-CM | POA: Diagnosis not present

## 2014-12-11 DIAGNOSIS — I10 Essential (primary) hypertension: Secondary | ICD-10-CM | POA: Diagnosis not present

## 2014-12-11 DIAGNOSIS — G9340 Encephalopathy, unspecified: Secondary | ICD-10-CM | POA: Diagnosis not present

## 2014-12-12 DIAGNOSIS — I10 Essential (primary) hypertension: Secondary | ICD-10-CM | POA: Diagnosis not present

## 2014-12-13 DIAGNOSIS — I10 Essential (primary) hypertension: Secondary | ICD-10-CM | POA: Diagnosis not present

## 2014-12-14 DIAGNOSIS — I10 Essential (primary) hypertension: Secondary | ICD-10-CM | POA: Diagnosis not present

## 2014-12-15 DIAGNOSIS — I10 Essential (primary) hypertension: Secondary | ICD-10-CM | POA: Diagnosis not present

## 2014-12-16 DIAGNOSIS — I10 Essential (primary) hypertension: Secondary | ICD-10-CM | POA: Diagnosis not present

## 2014-12-17 DIAGNOSIS — I10 Essential (primary) hypertension: Secondary | ICD-10-CM | POA: Diagnosis not present

## 2014-12-18 DIAGNOSIS — I10 Essential (primary) hypertension: Secondary | ICD-10-CM | POA: Diagnosis not present

## 2014-12-19 DIAGNOSIS — I10 Essential (primary) hypertension: Secondary | ICD-10-CM | POA: Diagnosis not present

## 2014-12-20 DIAGNOSIS — G629 Polyneuropathy, unspecified: Secondary | ICD-10-CM | POA: Diagnosis not present

## 2014-12-20 DIAGNOSIS — F329 Major depressive disorder, single episode, unspecified: Secondary | ICD-10-CM | POA: Diagnosis not present

## 2014-12-20 DIAGNOSIS — I10 Essential (primary) hypertension: Secondary | ICD-10-CM | POA: Diagnosis not present

## 2014-12-20 DIAGNOSIS — F0390 Unspecified dementia without behavioral disturbance: Secondary | ICD-10-CM | POA: Diagnosis not present

## 2014-12-20 DIAGNOSIS — E119 Type 2 diabetes mellitus without complications: Secondary | ICD-10-CM | POA: Diagnosis not present

## 2014-12-21 DIAGNOSIS — I10 Essential (primary) hypertension: Secondary | ICD-10-CM | POA: Diagnosis not present

## 2014-12-22 DIAGNOSIS — I10 Essential (primary) hypertension: Secondary | ICD-10-CM | POA: Diagnosis not present

## 2014-12-23 DIAGNOSIS — I10 Essential (primary) hypertension: Secondary | ICD-10-CM | POA: Diagnosis not present

## 2014-12-24 DIAGNOSIS — I10 Essential (primary) hypertension: Secondary | ICD-10-CM | POA: Diagnosis not present

## 2014-12-25 DIAGNOSIS — I10 Essential (primary) hypertension: Secondary | ICD-10-CM | POA: Diagnosis not present

## 2014-12-26 DIAGNOSIS — I10 Essential (primary) hypertension: Secondary | ICD-10-CM | POA: Diagnosis not present

## 2014-12-27 DIAGNOSIS — I10 Essential (primary) hypertension: Secondary | ICD-10-CM | POA: Diagnosis not present

## 2014-12-28 DIAGNOSIS — I10 Essential (primary) hypertension: Secondary | ICD-10-CM | POA: Diagnosis not present

## 2014-12-29 DIAGNOSIS — I10 Essential (primary) hypertension: Secondary | ICD-10-CM | POA: Diagnosis not present

## 2014-12-30 DIAGNOSIS — I10 Essential (primary) hypertension: Secondary | ICD-10-CM | POA: Diagnosis not present

## 2014-12-31 DIAGNOSIS — I10 Essential (primary) hypertension: Secondary | ICD-10-CM | POA: Diagnosis not present

## 2015-01-01 DIAGNOSIS — I10 Essential (primary) hypertension: Secondary | ICD-10-CM | POA: Diagnosis not present

## 2015-01-02 DIAGNOSIS — I10 Essential (primary) hypertension: Secondary | ICD-10-CM | POA: Diagnosis not present

## 2015-01-03 DIAGNOSIS — I10 Essential (primary) hypertension: Secondary | ICD-10-CM | POA: Diagnosis not present

## 2015-01-04 DIAGNOSIS — I10 Essential (primary) hypertension: Secondary | ICD-10-CM | POA: Diagnosis not present

## 2015-01-05 DIAGNOSIS — I10 Essential (primary) hypertension: Secondary | ICD-10-CM | POA: Diagnosis not present

## 2015-01-06 DIAGNOSIS — I10 Essential (primary) hypertension: Secondary | ICD-10-CM | POA: Diagnosis not present

## 2015-01-07 DIAGNOSIS — I10 Essential (primary) hypertension: Secondary | ICD-10-CM | POA: Diagnosis not present

## 2015-01-08 DIAGNOSIS — I10 Essential (primary) hypertension: Secondary | ICD-10-CM | POA: Diagnosis not present

## 2015-01-09 DIAGNOSIS — I10 Essential (primary) hypertension: Secondary | ICD-10-CM | POA: Diagnosis not present

## 2015-01-10 DIAGNOSIS — I10 Essential (primary) hypertension: Secondary | ICD-10-CM | POA: Diagnosis not present

## 2015-01-11 DIAGNOSIS — G9340 Encephalopathy, unspecified: Secondary | ICD-10-CM | POA: Diagnosis not present

## 2015-01-11 DIAGNOSIS — I10 Essential (primary) hypertension: Secondary | ICD-10-CM | POA: Diagnosis not present

## 2015-01-12 DIAGNOSIS — I10 Essential (primary) hypertension: Secondary | ICD-10-CM | POA: Diagnosis not present

## 2015-01-13 DIAGNOSIS — I10 Essential (primary) hypertension: Secondary | ICD-10-CM | POA: Diagnosis not present

## 2015-01-14 DIAGNOSIS — I10 Essential (primary) hypertension: Secondary | ICD-10-CM | POA: Diagnosis not present

## 2015-01-15 DIAGNOSIS — I10 Essential (primary) hypertension: Secondary | ICD-10-CM | POA: Diagnosis not present

## 2015-01-16 DIAGNOSIS — I10 Essential (primary) hypertension: Secondary | ICD-10-CM | POA: Diagnosis not present

## 2015-01-17 DIAGNOSIS — I10 Essential (primary) hypertension: Secondary | ICD-10-CM | POA: Diagnosis not present

## 2015-01-18 DIAGNOSIS — I10 Essential (primary) hypertension: Secondary | ICD-10-CM | POA: Diagnosis not present

## 2015-01-19 DIAGNOSIS — I10 Essential (primary) hypertension: Secondary | ICD-10-CM | POA: Diagnosis not present

## 2015-01-20 DIAGNOSIS — I10 Essential (primary) hypertension: Secondary | ICD-10-CM | POA: Diagnosis not present

## 2015-01-21 DIAGNOSIS — I10 Essential (primary) hypertension: Secondary | ICD-10-CM | POA: Diagnosis not present

## 2015-01-22 DIAGNOSIS — I10 Essential (primary) hypertension: Secondary | ICD-10-CM | POA: Diagnosis not present

## 2015-01-23 DIAGNOSIS — I10 Essential (primary) hypertension: Secondary | ICD-10-CM | POA: Diagnosis not present

## 2015-01-24 DIAGNOSIS — I10 Essential (primary) hypertension: Secondary | ICD-10-CM | POA: Diagnosis not present

## 2015-01-25 DIAGNOSIS — I10 Essential (primary) hypertension: Secondary | ICD-10-CM | POA: Diagnosis not present

## 2015-01-26 DIAGNOSIS — I10 Essential (primary) hypertension: Secondary | ICD-10-CM | POA: Diagnosis not present

## 2015-01-27 DIAGNOSIS — I10 Essential (primary) hypertension: Secondary | ICD-10-CM | POA: Diagnosis not present

## 2015-01-28 DIAGNOSIS — I10 Essential (primary) hypertension: Secondary | ICD-10-CM | POA: Diagnosis not present

## 2015-01-29 DIAGNOSIS — I10 Essential (primary) hypertension: Secondary | ICD-10-CM | POA: Diagnosis not present

## 2015-01-30 DIAGNOSIS — I10 Essential (primary) hypertension: Secondary | ICD-10-CM | POA: Diagnosis not present

## 2015-01-31 DIAGNOSIS — I10 Essential (primary) hypertension: Secondary | ICD-10-CM | POA: Diagnosis not present

## 2015-02-01 DIAGNOSIS — I10 Essential (primary) hypertension: Secondary | ICD-10-CM | POA: Diagnosis not present

## 2015-02-02 DIAGNOSIS — I10 Essential (primary) hypertension: Secondary | ICD-10-CM | POA: Diagnosis not present

## 2015-02-03 DIAGNOSIS — I10 Essential (primary) hypertension: Secondary | ICD-10-CM | POA: Diagnosis not present

## 2015-02-04 DIAGNOSIS — I10 Essential (primary) hypertension: Secondary | ICD-10-CM | POA: Diagnosis not present

## 2015-02-05 DIAGNOSIS — I10 Essential (primary) hypertension: Secondary | ICD-10-CM | POA: Diagnosis not present

## 2015-02-06 DIAGNOSIS — I10 Essential (primary) hypertension: Secondary | ICD-10-CM | POA: Diagnosis not present

## 2015-02-07 DIAGNOSIS — I10 Essential (primary) hypertension: Secondary | ICD-10-CM | POA: Diagnosis not present

## 2015-02-08 DIAGNOSIS — I10 Essential (primary) hypertension: Secondary | ICD-10-CM | POA: Diagnosis not present

## 2015-02-09 DIAGNOSIS — I10 Essential (primary) hypertension: Secondary | ICD-10-CM | POA: Diagnosis not present

## 2015-02-10 DIAGNOSIS — I10 Essential (primary) hypertension: Secondary | ICD-10-CM | POA: Diagnosis not present

## 2015-02-11 DIAGNOSIS — I10 Essential (primary) hypertension: Secondary | ICD-10-CM | POA: Diagnosis not present

## 2015-02-11 DIAGNOSIS — G9340 Encephalopathy, unspecified: Secondary | ICD-10-CM | POA: Diagnosis not present

## 2015-02-12 DIAGNOSIS — I10 Essential (primary) hypertension: Secondary | ICD-10-CM | POA: Diagnosis not present

## 2015-02-13 DIAGNOSIS — I10 Essential (primary) hypertension: Secondary | ICD-10-CM | POA: Diagnosis not present

## 2015-02-14 DIAGNOSIS — I10 Essential (primary) hypertension: Secondary | ICD-10-CM | POA: Diagnosis not present

## 2015-02-15 DIAGNOSIS — I10 Essential (primary) hypertension: Secondary | ICD-10-CM | POA: Diagnosis not present

## 2015-02-16 DIAGNOSIS — I10 Essential (primary) hypertension: Secondary | ICD-10-CM | POA: Diagnosis not present

## 2015-02-17 DIAGNOSIS — I10 Essential (primary) hypertension: Secondary | ICD-10-CM | POA: Diagnosis not present

## 2015-02-18 DIAGNOSIS — I10 Essential (primary) hypertension: Secondary | ICD-10-CM | POA: Diagnosis not present

## 2015-02-19 DIAGNOSIS — I10 Essential (primary) hypertension: Secondary | ICD-10-CM | POA: Diagnosis not present

## 2015-02-20 DIAGNOSIS — I10 Essential (primary) hypertension: Secondary | ICD-10-CM | POA: Diagnosis not present

## 2015-02-21 DIAGNOSIS — I10 Essential (primary) hypertension: Secondary | ICD-10-CM | POA: Diagnosis not present

## 2015-02-22 DIAGNOSIS — I10 Essential (primary) hypertension: Secondary | ICD-10-CM | POA: Diagnosis not present

## 2015-02-23 DIAGNOSIS — I10 Essential (primary) hypertension: Secondary | ICD-10-CM | POA: Diagnosis not present

## 2015-02-24 DIAGNOSIS — I10 Essential (primary) hypertension: Secondary | ICD-10-CM | POA: Diagnosis not present

## 2015-02-25 DIAGNOSIS — I10 Essential (primary) hypertension: Secondary | ICD-10-CM | POA: Diagnosis not present

## 2015-02-26 DIAGNOSIS — I10 Essential (primary) hypertension: Secondary | ICD-10-CM | POA: Diagnosis not present

## 2015-02-27 DIAGNOSIS — I10 Essential (primary) hypertension: Secondary | ICD-10-CM | POA: Diagnosis not present

## 2015-02-28 DIAGNOSIS — I10 Essential (primary) hypertension: Secondary | ICD-10-CM | POA: Diagnosis not present

## 2015-03-01 DIAGNOSIS — I10 Essential (primary) hypertension: Secondary | ICD-10-CM | POA: Diagnosis not present

## 2015-03-02 DIAGNOSIS — I10 Essential (primary) hypertension: Secondary | ICD-10-CM | POA: Diagnosis not present

## 2015-03-03 DIAGNOSIS — I10 Essential (primary) hypertension: Secondary | ICD-10-CM | POA: Diagnosis not present

## 2015-03-04 DIAGNOSIS — I10 Essential (primary) hypertension: Secondary | ICD-10-CM | POA: Diagnosis not present

## 2015-03-05 DIAGNOSIS — I10 Essential (primary) hypertension: Secondary | ICD-10-CM | POA: Diagnosis not present

## 2015-03-06 DIAGNOSIS — I10 Essential (primary) hypertension: Secondary | ICD-10-CM | POA: Diagnosis not present

## 2015-03-07 DIAGNOSIS — I10 Essential (primary) hypertension: Secondary | ICD-10-CM | POA: Diagnosis not present

## 2015-03-08 DIAGNOSIS — I10 Essential (primary) hypertension: Secondary | ICD-10-CM | POA: Diagnosis not present

## 2015-03-09 DIAGNOSIS — I10 Essential (primary) hypertension: Secondary | ICD-10-CM | POA: Diagnosis not present

## 2015-03-10 DIAGNOSIS — I10 Essential (primary) hypertension: Secondary | ICD-10-CM | POA: Diagnosis not present

## 2015-03-11 DIAGNOSIS — I10 Essential (primary) hypertension: Secondary | ICD-10-CM | POA: Diagnosis not present

## 2015-03-12 DIAGNOSIS — I10 Essential (primary) hypertension: Secondary | ICD-10-CM | POA: Diagnosis not present

## 2015-03-13 DIAGNOSIS — I10 Essential (primary) hypertension: Secondary | ICD-10-CM | POA: Diagnosis not present

## 2015-03-13 DIAGNOSIS — G9340 Encephalopathy, unspecified: Secondary | ICD-10-CM | POA: Diagnosis not present

## 2015-03-14 DIAGNOSIS — I10 Essential (primary) hypertension: Secondary | ICD-10-CM | POA: Diagnosis not present

## 2015-03-15 DIAGNOSIS — I10 Essential (primary) hypertension: Secondary | ICD-10-CM | POA: Diagnosis not present

## 2015-03-16 DIAGNOSIS — I10 Essential (primary) hypertension: Secondary | ICD-10-CM | POA: Diagnosis not present

## 2015-03-17 DIAGNOSIS — I10 Essential (primary) hypertension: Secondary | ICD-10-CM | POA: Diagnosis not present

## 2015-03-18 DIAGNOSIS — I10 Essential (primary) hypertension: Secondary | ICD-10-CM | POA: Diagnosis not present

## 2015-03-19 DIAGNOSIS — I10 Essential (primary) hypertension: Secondary | ICD-10-CM | POA: Diagnosis not present

## 2015-03-20 DIAGNOSIS — I10 Essential (primary) hypertension: Secondary | ICD-10-CM | POA: Diagnosis not present

## 2015-03-21 DIAGNOSIS — I10 Essential (primary) hypertension: Secondary | ICD-10-CM | POA: Diagnosis not present

## 2015-03-22 DIAGNOSIS — I10 Essential (primary) hypertension: Secondary | ICD-10-CM | POA: Diagnosis not present

## 2015-03-23 DIAGNOSIS — I10 Essential (primary) hypertension: Secondary | ICD-10-CM | POA: Diagnosis not present

## 2015-03-24 DIAGNOSIS — I10 Essential (primary) hypertension: Secondary | ICD-10-CM | POA: Diagnosis not present

## 2015-03-25 DIAGNOSIS — I10 Essential (primary) hypertension: Secondary | ICD-10-CM | POA: Diagnosis not present

## 2015-03-26 DIAGNOSIS — I10 Essential (primary) hypertension: Secondary | ICD-10-CM | POA: Diagnosis not present

## 2015-03-27 DIAGNOSIS — I10 Essential (primary) hypertension: Secondary | ICD-10-CM | POA: Diagnosis not present

## 2015-03-28 DIAGNOSIS — I10 Essential (primary) hypertension: Secondary | ICD-10-CM | POA: Diagnosis not present

## 2015-03-29 DIAGNOSIS — I10 Essential (primary) hypertension: Secondary | ICD-10-CM | POA: Diagnosis not present

## 2015-03-30 DIAGNOSIS — I10 Essential (primary) hypertension: Secondary | ICD-10-CM | POA: Diagnosis not present

## 2015-03-31 DIAGNOSIS — I10 Essential (primary) hypertension: Secondary | ICD-10-CM | POA: Diagnosis not present

## 2015-04-01 DIAGNOSIS — I10 Essential (primary) hypertension: Secondary | ICD-10-CM | POA: Diagnosis not present

## 2015-04-02 DIAGNOSIS — I10 Essential (primary) hypertension: Secondary | ICD-10-CM | POA: Diagnosis not present

## 2015-04-05 DIAGNOSIS — N183 Chronic kidney disease, stage 3 (moderate): Secondary | ICD-10-CM | POA: Diagnosis not present

## 2015-04-05 DIAGNOSIS — E039 Hypothyroidism, unspecified: Secondary | ICD-10-CM | POA: Diagnosis not present

## 2015-04-05 DIAGNOSIS — E784 Other hyperlipidemia: Secondary | ICD-10-CM | POA: Diagnosis not present

## 2015-04-05 DIAGNOSIS — E119 Type 2 diabetes mellitus without complications: Secondary | ICD-10-CM | POA: Diagnosis not present

## 2015-04-05 DIAGNOSIS — I10 Essential (primary) hypertension: Secondary | ICD-10-CM | POA: Diagnosis not present

## 2015-04-13 DIAGNOSIS — G9340 Encephalopathy, unspecified: Secondary | ICD-10-CM | POA: Diagnosis not present

## 2015-04-17 ENCOUNTER — Telehealth: Payer: Self-pay

## 2015-04-17 NOTE — Telephone Encounter (Signed)
This patient needs AWV with albumin creatinine ration prior to 11/30  Also needs CPE this year with Dr. Jenny Reichmann Laurette Schimke; LVM to call the practice to schedule these apts.

## 2015-04-20 DIAGNOSIS — Z23 Encounter for immunization: Secondary | ICD-10-CM | POA: Diagnosis not present

## 2015-04-25 NOTE — Telephone Encounter (Signed)
Call to schedule awv as well as needs CPE with Dr. Jenny Reichmann and urine albumin; Left 2nd vm

## 2015-06-13 IMAGING — CR DG FOOT COMPLETE 3+V*L*
3 series · 3 of 3 positions shown · non-contrast
Comparison: None.

CLINICAL DATA: Bilateral foot pain.

EXAM:
LEFT FOOT - COMPLETE 3+ VIEW

[x foot ap left]
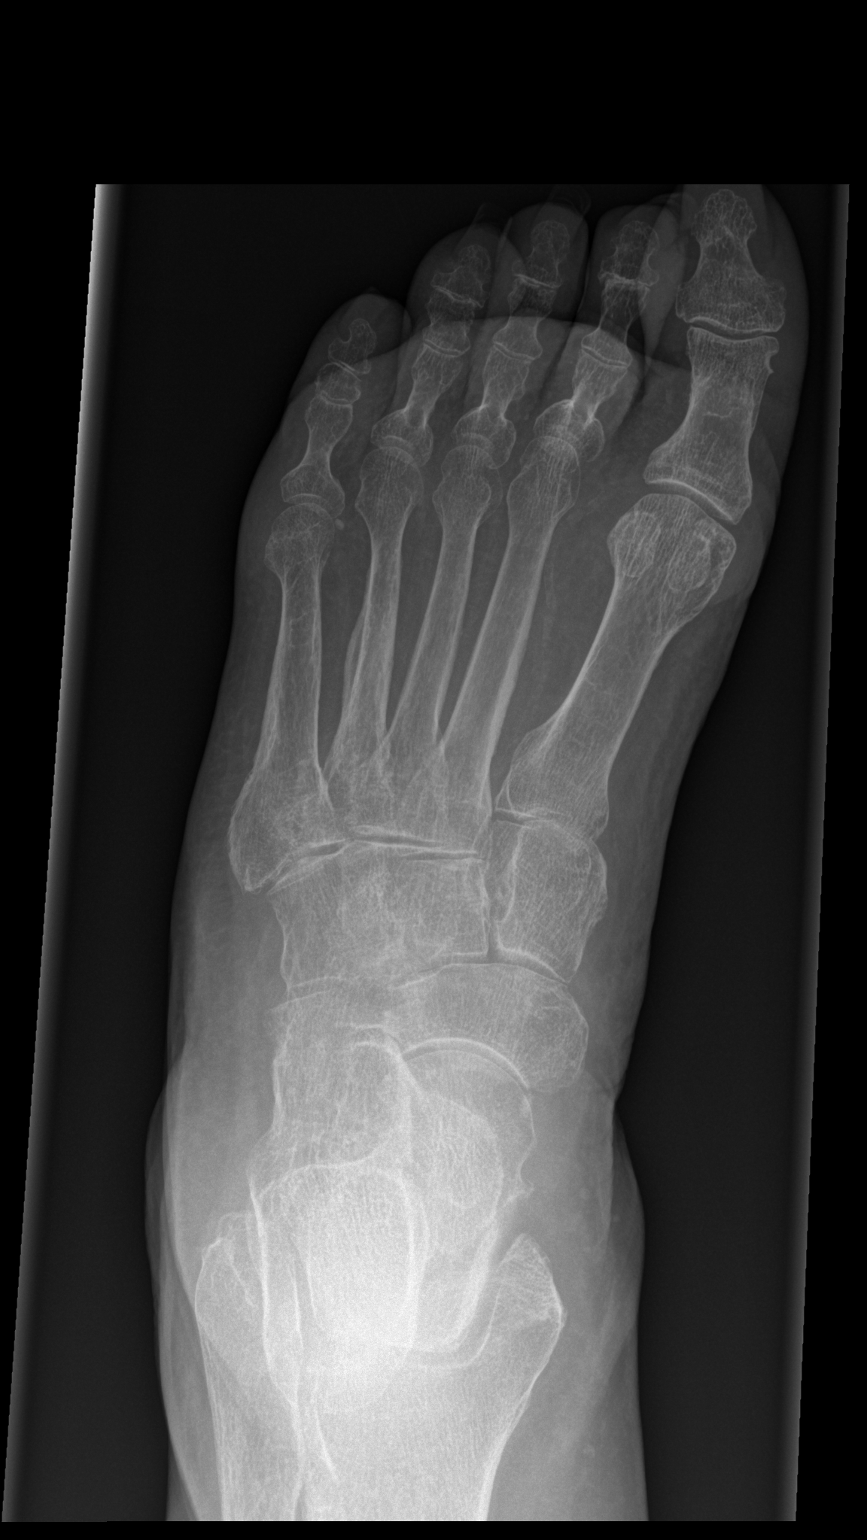

[x foot obl left]
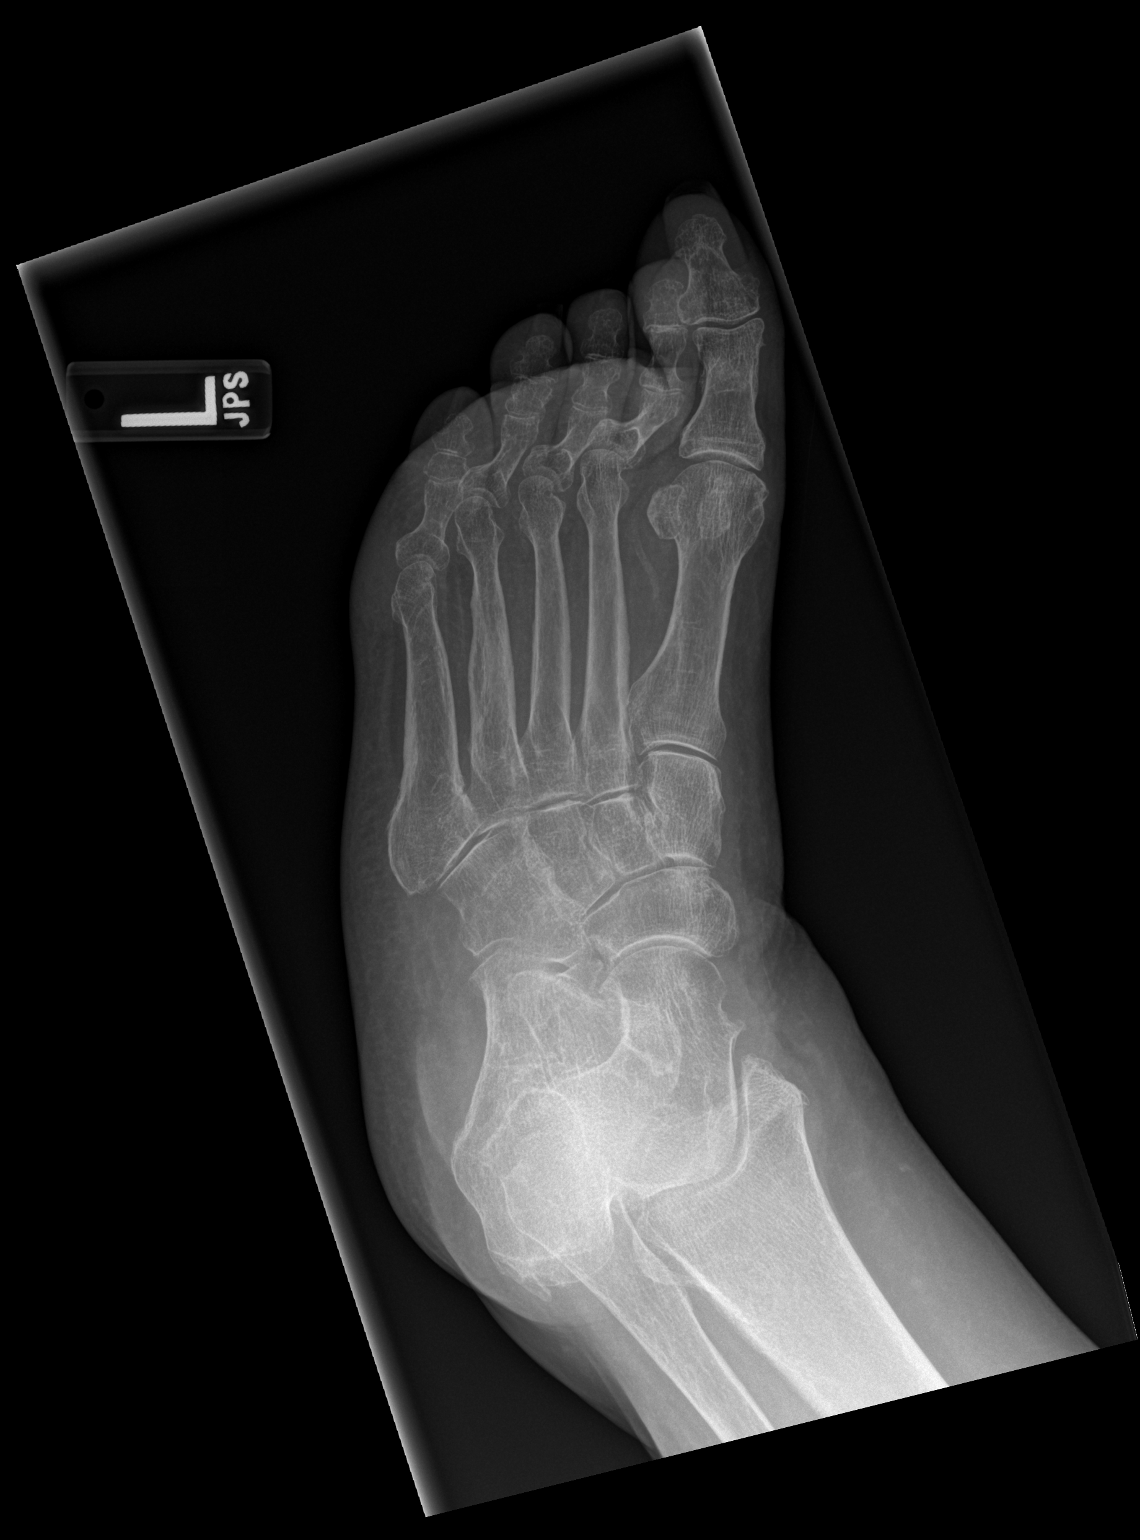

[x foot lat left]
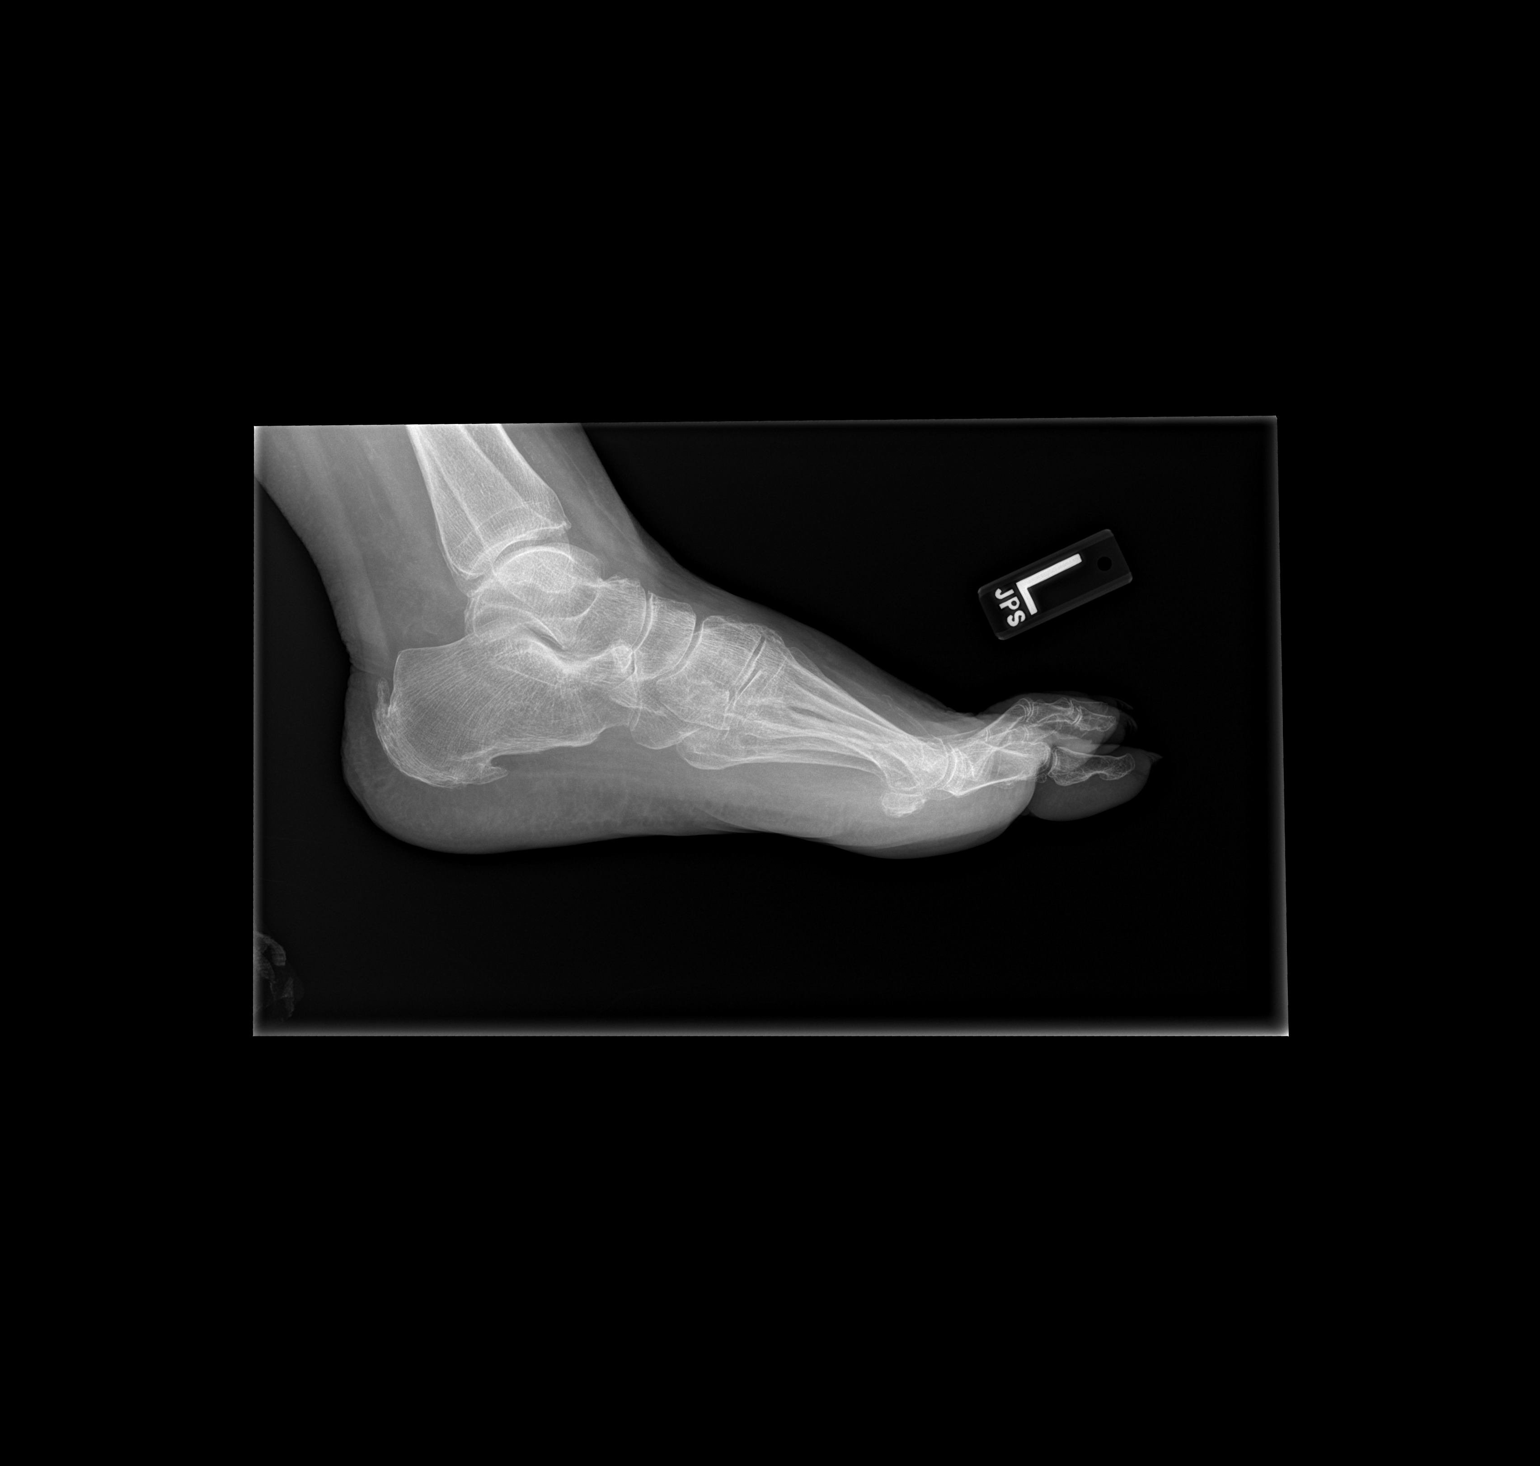

[3 of 3 positions shown; findings below may reference images not displayed]

FINDINGS: The left foot demonstrates no fracture or dislocation. There is
generalized osteopenia. There is a plantar calcaneal spur. There are
enthesopathic changes of the Achilles tendon insertion. There is no
soft tissue abnormality. There is no subcutaneous emphysema or
radiopaque foreign bodies.
IMPRESSION: No acute osseous injury of the left foot.

## 2015-08-31 ENCOUNTER — Other Ambulatory Visit (INDEPENDENT_AMBULATORY_CARE_PROVIDER_SITE_OTHER): Payer: Medicare Other

## 2015-08-31 ENCOUNTER — Encounter: Payer: Self-pay | Admitting: Internal Medicine

## 2015-08-31 ENCOUNTER — Ambulatory Visit (INDEPENDENT_AMBULATORY_CARE_PROVIDER_SITE_OTHER): Payer: Medicare Other | Admitting: Internal Medicine

## 2015-08-31 VITALS — BP 116/74 | HR 60 | Temp 98.0°F | Resp 20 | Wt 218.0 lb

## 2015-08-31 DIAGNOSIS — E119 Type 2 diabetes mellitus without complications: Secondary | ICD-10-CM

## 2015-08-31 DIAGNOSIS — I10 Essential (primary) hypertension: Secondary | ICD-10-CM

## 2015-08-31 DIAGNOSIS — Z794 Long term (current) use of insulin: Secondary | ICD-10-CM | POA: Diagnosis not present

## 2015-08-31 DIAGNOSIS — G6289 Other specified polyneuropathies: Secondary | ICD-10-CM | POA: Diagnosis not present

## 2015-08-31 DIAGNOSIS — IMO0001 Reserved for inherently not codable concepts without codable children: Secondary | ICD-10-CM

## 2015-08-31 LAB — HEPATIC FUNCTION PANEL
ALBUMIN: 3.9 g/dL (ref 3.5–5.2)
ALK PHOS: 67 U/L (ref 39–117)
ALT: 11 U/L (ref 0–35)
AST: 8 U/L (ref 0–37)
Bilirubin, Direct: 0.2 mg/dL (ref 0.0–0.3)
Total Bilirubin: 0.9 mg/dL (ref 0.2–1.2)
Total Protein: 7.3 g/dL (ref 6.0–8.3)

## 2015-08-31 LAB — LIPID PANEL
CHOLESTEROL: 140 mg/dL (ref 0–200)
HDL: 27.1 mg/dL — AB (ref >39.00)
LDL Cholesterol: 86 mg/dL (ref 0–99)
NonHDL: 113.02
Total CHOL/HDL Ratio: 5
Triglycerides: 136 mg/dL (ref 0.0–149.0)
VLDL: 27.2 mg/dL (ref 0.0–40.0)

## 2015-08-31 LAB — CBC WITH DIFFERENTIAL/PLATELET
BASOS ABS: 0.1 10*3/uL (ref 0.0–0.1)
Basophils Relative: 0.5 % (ref 0.0–3.0)
Eosinophils Absolute: 0.1 10*3/uL (ref 0.0–0.7)
Eosinophils Relative: 0.7 % (ref 0.0–5.0)
HEMATOCRIT: 38.5 % (ref 36.0–46.0)
Hemoglobin: 12.6 g/dL (ref 12.0–15.0)
Lymphocytes Relative: 11.1 % — ABNORMAL LOW (ref 12.0–46.0)
Lymphs Abs: 1.5 10*3/uL (ref 0.7–4.0)
MCHC: 32.6 g/dL (ref 30.0–36.0)
MCV: 87.9 fl (ref 78.0–100.0)
MONOS PCT: 4.2 % (ref 3.0–12.0)
Monocytes Absolute: 0.6 10*3/uL (ref 0.1–1.0)
NEUTROS PCT: 83.5 % — AB (ref 43.0–77.0)
Neutro Abs: 11.1 10*3/uL — ABNORMAL HIGH (ref 1.4–7.7)
PLATELETS: 356 10*3/uL (ref 150.0–400.0)
RBC: 4.38 Mil/uL (ref 3.87–5.11)
RDW: 15.1 % (ref 11.5–15.5)
WBC: 13.3 10*3/uL — ABNORMAL HIGH (ref 4.0–10.5)

## 2015-08-31 LAB — BASIC METABOLIC PANEL
BUN: 17 mg/dL (ref 6–23)
CALCIUM: 10.1 mg/dL (ref 8.4–10.5)
CO2: 27 meq/L (ref 19–32)
CREATININE: 1.23 mg/dL — AB (ref 0.40–1.20)
Chloride: 109 mEq/L (ref 96–112)
GFR: 54.1 mL/min — ABNORMAL LOW (ref >60.00)
GLUCOSE: 118 mg/dL — AB (ref 70–99)
Potassium: 3.9 mEq/L (ref 3.5–5.1)
SODIUM: 146 meq/L — AB (ref 135–145)

## 2015-08-31 LAB — TSH: TSH: 3.13 u[IU]/mL (ref 0.35–4.50)

## 2015-08-31 LAB — HEMOGLOBIN A1C: HEMOGLOBIN A1C: 6.9 % — AB (ref 4.6–6.5)

## 2015-08-31 MED ORDER — TRAMADOL HCL 50 MG PO TABS: 50.0000 mg | ORAL_TABLET | Freq: Four times a day (QID) | ORAL | Status: DC | PRN

## 2015-08-31 MED ORDER — GABAPENTIN 300 MG PO CAPS: 300.0000 mg | ORAL_CAPSULE | Freq: Three times a day (TID) | ORAL | Status: DC

## 2015-08-31 NOTE — Assessment & Plan Note (Signed)
stable overall by history and exam, recent data reviewed with pt, and pt to continue medical treatment as before,  to f/u any worsening symptoms or concerns Lab Results  Component Value Date   HGBA1C 6.9* 08/31/2015

## 2015-08-31 NOTE — Assessment & Plan Note (Signed)
Suspect etiology of pain, to increase the gabapentin to 300 tid, tramadol prn,  to f/u any worsening symptoms or concerns, family declines MRI LS spine or NCS for now

## 2015-08-31 NOTE — Progress Notes (Signed)
Subjective:    Patient ID: Katie Dawson, female    DOB: 1934/02/09, 82 y.o.   MRN: [id number]  HPI  Here to f/u with family, pt is poor historian due to dementia, and family only able to help with "she keeps saying she is in pain."  Pt has known hx of lumbar spine dz and peripheral neuropathy. Pt continues to have recurring LBP without change in severity, bowel or bladder change, fever, wt loss,  But not clear about worsening LE pain/numbness/weakness except to say pt no longer walking or standing up without assist for at least 3 mo.   Pt denies fever, wt loss, night sweats, loss of appetite.  Pt denies polydipsia, polyuria, or low sugar symptoms such as weakness or confusion improved with po intake.   Past Medical History  Diagnosis Date  . DIABETES MELLITUS, TYPE II 04/22/2007  . HYPERLIPIDEMIA 04/22/2007  . GOUT 04/22/2007  . ANEMIA-IRON DEFICIENCY 04/22/2007  . ANXIETY 04/22/2007  . OBSTRUCTIVE SLEEP APNEA 04/22/2007  . HYPERTENSION 04/22/2007  . GERD 04/22/2007  . RENAL INSUFFICIENCY 04/22/2007  . 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 FOOT PAIN, LEFT 12/23/2007  . OSTEOPENIA 02/12/2009  . INTERMITTENT VERTIGO 04/20/2009  . HYPERSOMNIA 01/11/2009  . COLONIC POLYPS, HX OF 04/22/2007  . NEPHRECTOMY, HX OF 04/22/2007  . Urinary tract infection     hx of  . Right fibular fracture July 2013  . Dementia    Past Surgical History  Procedure Laterality Date  . S/p right nephrectomy    . S/p parathyroid surgury    . Orif wrist fracture  01/02/2012    Procedure: OPEN REDUCTION INTERNAL FIXATION (ORIF) WRIST FRACTURE;  Surgeon: Schuyler Amor, MD;  Location: [facility];  Service: Orthopedics;  Laterality: Right;  Open Reduction Internal Fixation Right Distal Radius     reports that she has never smoked. She has never used smokeless tobacco. She reports that she does not drink alcohol or use illicit drugs. family history includes Heart disease in her mother. No Known  Allergies Current Outpatient Prescriptions on File Prior to Visit  Medication Sig Dispense Refill  . amLODipine (NORVASC) 10 MG tablet Take 10 mg by mouth daily.    Marland Kitchen aspirin EC 81 MG tablet Take 1 tablet (81 mg total) by mouth daily. 30 tablet 0  . BD PEN NEEDLE NANO U/F 32G X 4 MM MISC USE WITH LANTUS DAILY. 100 each 6  . busPIRone (BUSPAR) 7.5 MG tablet Take 7.5 mg by mouth 3 (three) times daily as needed (nerves).    . citalopram (CELEXA) 10 MG tablet Take 10 mg by mouth daily.    . cloNIDine (CATAPRES) 0.1 MG tablet Take 0.1 mg by mouth 2 (two) times daily.    Marland Kitchen donepezil (ARICEPT) 10 MG tablet Take 1 tablet (10 mg total) by mouth at bedtime. 90 tablet 3  . glipiZIDE (GLUCOTROL XL) 5 MG 24 hr tablet Take 1 tablet (5 mg total) by mouth daily with breakfast. 90 tablet 3  . glucose blood (BAYER CONTOUR NEXT TEST) test strip Use as instructed 100 each 12  . hydrALAZINE (APRESOLINE) 25 MG tablet Take 1 tablet (25 mg total) by mouth every 8 (eight) hours.    . insulin aspart (NOVOLOG) 100 UNIT/ML injection Before each meal 3 times a day, 140-199 - 2 units, 200-250 - 4 units, 251-299 - 6 units,  300-349 - 8 units,  350 or above 10 units. Dispense syringes and needles as needed, Ok to switch to  PEN if approved. 1 vial 12  . insulin glargine (LANTUS) 100 UNIT/ML injection Inject 60 Units into the skin at bedtime.    . Insulin Pen Needle (BD PEN NEEDLE NANO U/F) 32G X 4 MM MISC 1 Device by Does not apply route daily as needed (insulin).    . isosorbide mononitrate (IMDUR) 60 MG 24 hr tablet Take 60 mg by mouth at bedtime.    Marland Kitchen labetalol (NORMODYNE) 200 MG tablet Take 200 mg by mouth 2 (two) times daily.    . Lancets MISC Use as directed 1 per day 100 each 12  . meclizine (ANTIVERT) 25 MG tablet Take 1 tablet (25 mg total) by mouth 3 (three) times daily as needed. 30 tablet 0  . risperiDONE (RISPERDAL) 0.25 MG tablet 1 tab by mouth in the AM, then 4 tabs by mouth in the PM 150 tablet 5  . insulin  glargine (LANTUS) 100 UNIT/ML injection Inject 0.6 mLs (60 Units total) into the skin at bedtime. 10 mL 11   No current facility-administered medications on file prior to visit.   Review of Systems Unable due to dementia    Objective:   Physical Exam BP 116/74 mmHg  Pulse 60  Temp(Src) 98 F (36.7 C) (Oral)  Resp 20  Wt 218 lb (98.884 kg)  SpO2 97% VS noted,  Constitutional: Pt appears in no apparent distress HENT: Head: NCAT.  Right Ear: External ear normal.  Left Ear: External ear normal.  Eyes: . Pupils are equal, round, and reactive to light. Conjunctivae and EOM are normal Neck: Normal range of motion. Neck supple.  Cardiovascular: Normal rate and regular rhythm.   Pulmonary/Chest: Effort normal and breath sounds without rales or wheezing.  Abd:  Soft, NT, ND, + BS, no flank tender Neurological: Pt is alert. Not confused , motor with general weakness, poor understanding and effort Spine: nontender Skin: Skin is warm. No rash, no LE edema Psychiatric: Pt behavior is normal. No agitation.      Assessment & Plan:

## 2015-08-31 NOTE — Progress Notes (Signed)
Pre visit review using our clinic review tool, if applicable. No additional management support is needed unless otherwise documented below in the visit note. 

## 2015-08-31 NOTE — Assessment & Plan Note (Signed)
stable overall by history and exam, recent data reviewed with pt, and pt to continue medical treatment as before,  to f/u any worsening symptoms or concerns BP Readings from Last 3 Encounters:  08/31/15 116/74  04/07/14 137/59  02/07/14 128/80

## 2015-08-31 NOTE — Patient Instructions (Addendum)
Please take all new medication as prescribed - the tramadol, and the increased gabapentin to 300 mg   Please continue all other medications as before, and refills have been done if requested.  Please have the pharmacy call with any other refills you may need.  Please keep your appointments with your specialists as you may have planned  Please go to the LAB in the Basement (turn left off the elevator) for the tests to be done today  You will be contacted by phone if any changes need to be made immediately.  Otherwise, you will receive a letter about your results with an explanation, but please check with MyChart first.  Please remember to sign up for MyChart if you have not done so, as this will be important to you in the future with finding out test results, communicating by private email, and scheduling acute appointments online when needed.  Please return in 6 months, or sooner if needed

## 2015-09-07 ENCOUNTER — Telehealth: Payer: Self-pay | Admitting: *Deleted

## 2015-09-07 NOTE — Telephone Encounter (Signed)
[place] for verbal for all, thanks

## 2015-09-07 NOTE — Telephone Encounter (Signed)
Left msg on triage stating requesting verbal order for PT & OT due to decline function mobility in both legs...Katie Dawson

## 2015-09-10 NOTE — Telephone Encounter (Signed)
Tried calling Myra back the # left is not the correct #. Will hold to see if she call back tomorrow...Johny Chess

## 2015-09-11 NOTE — Telephone Encounter (Signed)
Nrse did not call back to confirm msg that was left on yesterday (Monday) the # that was left is not the correct #.Will close encounter...Johny Chess

## 2015-09-13 ENCOUNTER — Emergency Department (HOSPITAL_COMMUNITY): Payer: Medicare Other

## 2015-09-13 ENCOUNTER — Inpatient Hospital Stay (HOSPITAL_COMMUNITY)
Admission: EM | Admit: 2015-09-13 | Discharge: 2015-09-17 | DRG: 307 | Disposition: A | Discharge status: Skilled Nursing Facility | Payer: Medicare Other | Attending: Internal Medicine | Admitting: Internal Medicine

## 2015-09-13 ENCOUNTER — Emergency Department (EMERGENCY_DEPARTMENT_HOSPITAL)
Admit: 2015-09-13 | Discharge: 2015-09-13 | Disposition: A | Discharge status: Home / Self Care | Payer: Medicare Other | Attending: Emergency Medicine | Admitting: Emergency Medicine

## 2015-09-13 ENCOUNTER — Encounter (HOSPITAL_COMMUNITY): Payer: Self-pay | Admitting: Emergency Medicine

## 2015-09-13 DIAGNOSIS — IMO0001 Reserved for inherently not codable concepts without codable children: Secondary | ICD-10-CM

## 2015-09-13 DIAGNOSIS — R42 Dizziness and giddiness: Secondary | ICD-10-CM | POA: Diagnosis not present

## 2015-09-13 DIAGNOSIS — Z7982 Long term (current) use of aspirin: Secondary | ICD-10-CM

## 2015-09-13 DIAGNOSIS — E1142 Type 2 diabetes mellitus with diabetic polyneuropathy: Secondary | ICD-10-CM | POA: Diagnosis present

## 2015-09-13 DIAGNOSIS — F329 Major depressive disorder, single episode, unspecified: Secondary | ICD-10-CM | POA: Diagnosis present

## 2015-09-13 DIAGNOSIS — R5383 Other fatigue: Secondary | ICD-10-CM | POA: Diagnosis present

## 2015-09-13 DIAGNOSIS — D638 Anemia in other chronic diseases classified elsewhere: Secondary | ICD-10-CM | POA: Diagnosis present

## 2015-09-13 DIAGNOSIS — E1122 Type 2 diabetes mellitus with diabetic chronic kidney disease: Secondary | ICD-10-CM | POA: Diagnosis present

## 2015-09-13 DIAGNOSIS — G4733 Obstructive sleep apnea (adult) (pediatric): Secondary | ICD-10-CM | POA: Diagnosis present

## 2015-09-13 DIAGNOSIS — N183 Chronic kidney disease, stage 3 (moderate): Secondary | ICD-10-CM | POA: Diagnosis present

## 2015-09-13 DIAGNOSIS — G4730 Sleep apnea, unspecified: Secondary | ICD-10-CM | POA: Diagnosis present

## 2015-09-13 DIAGNOSIS — I350 Nonrheumatic aortic (valve) stenosis: Principal | ICD-10-CM

## 2015-09-13 DIAGNOSIS — E870 Hyperosmolality and hypernatremia: Secondary | ICD-10-CM | POA: Diagnosis present

## 2015-09-13 DIAGNOSIS — N390 Urinary tract infection, site not specified: Secondary | ICD-10-CM | POA: Diagnosis present

## 2015-09-13 DIAGNOSIS — I10 Essential (primary) hypertension: Secondary | ICD-10-CM | POA: Diagnosis present

## 2015-09-13 DIAGNOSIS — E785 Hyperlipidemia, unspecified: Secondary | ICD-10-CM | POA: Diagnosis present

## 2015-09-13 DIAGNOSIS — Z794 Long term (current) use of insulin: Secondary | ICD-10-CM

## 2015-09-13 DIAGNOSIS — M79604 Pain in right leg: Secondary | ICD-10-CM

## 2015-09-13 DIAGNOSIS — R55 Syncope and collapse: Secondary | ICD-10-CM | POA: Diagnosis not present

## 2015-09-13 DIAGNOSIS — F0390 Unspecified dementia without behavioral disturbance: Secondary | ICD-10-CM | POA: Diagnosis present

## 2015-09-13 DIAGNOSIS — M8580 Other specified disorders of bone density and structure, unspecified site: Secondary | ICD-10-CM | POA: Diagnosis present

## 2015-09-13 DIAGNOSIS — M79609 Pain in unspecified limb: Secondary | ICD-10-CM

## 2015-09-13 DIAGNOSIS — I129 Hypertensive chronic kidney disease with stage 1 through stage 4 chronic kidney disease, or unspecified chronic kidney disease: Secondary | ICD-10-CM | POA: Diagnosis present

## 2015-09-13 DIAGNOSIS — E119 Type 2 diabetes mellitus without complications: Secondary | ICD-10-CM

## 2015-09-13 LAB — CBC WITH DIFFERENTIAL/PLATELET
BASOS PCT: 0 %
Basophils Absolute: 0 10*3/uL (ref 0.0–0.1)
EOS ABS: 0.3 10*3/uL (ref 0.0–0.7)
EOS PCT: 3 %
HCT: 33.4 % — ABNORMAL LOW (ref 36.0–46.0)
HEMOGLOBIN: 10.5 g/dL — AB (ref 12.0–15.0)
Lymphocytes Relative: 17 %
Lymphs Abs: 1.8 10*3/uL (ref 0.7–4.0)
MCH: 29.2 pg (ref 26.0–34.0)
MCHC: 31.4 g/dL (ref 30.0–36.0)
MCV: 92.8 fL (ref 78.0–100.0)
MONO ABS: 0.5 10*3/uL (ref 0.1–1.0)
MONOS PCT: 5 %
Neutro Abs: 8.1 10*3/uL — ABNORMAL HIGH (ref 1.7–7.7)
Neutrophils Relative %: 75 %
PLATELETS: 282 10*3/uL (ref 150–400)
RBC: 3.6 MIL/uL — ABNORMAL LOW (ref 3.87–5.11)
RDW: 14.5 % (ref 11.5–15.5)
WBC: 10.7 10*3/uL — ABNORMAL HIGH (ref 4.0–10.5)

## 2015-09-13 LAB — MRSA PCR SCREENING: MRSA BY PCR: NEGATIVE

## 2015-09-13 LAB — URINALYSIS, ROUTINE W REFLEX MICROSCOPIC
Bilirubin Urine: NEGATIVE
Glucose, UA: NEGATIVE mg/dL
Hgb urine dipstick: NEGATIVE
Ketones, ur: NEGATIVE mg/dL
Nitrite: NEGATIVE
Protein, ur: 30 mg/dL — AB
Specific Gravity, Urine: 1.014 (ref 1.005–1.030)
pH: 7.5 (ref 5.0–8.0)

## 2015-09-13 LAB — COMPREHENSIVE METABOLIC PANEL
ALBUMIN: 2.9 g/dL — AB (ref 3.5–5.0)
ALT: 16 U/L (ref 14–54)
AST: 13 U/L — ABNORMAL LOW (ref 15–41)
Alkaline Phosphatase: 60 U/L (ref 38–126)
Anion gap: 10 (ref 5–15)
BUN: 22 mg/dL — ABNORMAL HIGH (ref 6–20)
CHLORIDE: 111 mmol/L (ref 101–111)
CO2: 27 mmol/L (ref 22–32)
Calcium: 9.5 mg/dL (ref 8.9–10.3)
Creatinine, Ser: 1.47 mg/dL — ABNORMAL HIGH (ref 0.44–1.00)
GFR calc Af Amer: 38 mL/min — ABNORMAL LOW (ref >60)
GFR calc non Af Amer: 33 mL/min — ABNORMAL LOW (ref >60)
GLUCOSE: 154 mg/dL — AB (ref 65–99)
POTASSIUM: 4 mmol/L (ref 3.5–5.1)
SODIUM: 148 mmol/L — AB (ref 135–145)
Total Bilirubin: 0.7 mg/dL (ref 0.3–1.2)
Total Protein: 6.1 g/dL — ABNORMAL LOW (ref 6.5–8.1)

## 2015-09-13 LAB — CBC
HEMATOCRIT: 34.3 % — AB (ref 36.0–46.0)
HEMOGLOBIN: 10.4 g/dL — AB (ref 12.0–15.0)
MCH: 28 pg (ref 26.0–34.0)
MCHC: 30.3 g/dL (ref 30.0–36.0)
MCV: 92.2 fL (ref 78.0–100.0)
Platelets: 262 10*3/uL (ref 150–400)
RBC: 3.72 MIL/uL — AB (ref 3.87–5.11)
RDW: 14.4 % (ref 11.5–15.5)
WBC: 7.8 10*3/uL (ref 4.0–10.5)

## 2015-09-13 LAB — CREATININE, SERUM
Creatinine, Ser: 1.19 mg/dL — ABNORMAL HIGH (ref 0.44–1.00)
GFR, EST AFRICAN AMERICAN: 49 mL/min — AB (ref >60)
GFR, EST NON AFRICAN AMERICAN: 42 mL/min — AB (ref >60)

## 2015-09-13 LAB — PROTIME-INR
INR: 1.16 (ref 0.00–1.49)
Prothrombin Time: 15 seconds (ref 11.6–15.2)

## 2015-09-13 LAB — URINE MICROSCOPIC-ADD ON: RBC / HPF: NONE SEEN RBC/hpf (ref 0–5)

## 2015-09-13 LAB — CBG MONITORING, ED: Glucose-Capillary: 110 mg/dL — ABNORMAL HIGH (ref 65–99)

## 2015-09-13 LAB — POC OCCULT BLOOD, ED: Fecal Occult Bld: NEGATIVE

## 2015-09-13 LAB — GLUCOSE, CAPILLARY: Glucose-Capillary: 77 mg/dL (ref 65–99)

## 2015-09-13 LAB — TROPONIN I

## 2015-09-13 MED ORDER — SODIUM CHLORIDE 0.9 % IV BOLUS (SEPSIS)
1000.0000 mL | Freq: Once | INTRAVENOUS | Status: AC
  Administered 2015-09-13: 1000 mL via INTRAVENOUS

## 2015-09-13 MED ORDER — CITALOPRAM HYDROBROMIDE 20 MG PO TABS
10.0000 mg | ORAL_TABLET | Freq: Every day | ORAL | Status: DC
  Administered 2015-09-14 – 2015-09-17 (×4): 10 mg via ORAL
  Filled 2015-09-13 (×4): qty 1

## 2015-09-13 MED ORDER — AMLODIPINE BESYLATE 10 MG PO TABS
10.0000 mg | ORAL_TABLET | Freq: Every day | ORAL | Status: DC
  Administered 2015-09-14 – 2015-09-17 (×4): 10 mg via ORAL
  Filled 2015-09-13 (×4): qty 1

## 2015-09-13 MED ORDER — DONEPEZIL HCL 10 MG PO TABS
10.0000 mg | ORAL_TABLET | Freq: Every day | ORAL | Status: DC
  Administered 2015-09-13 – 2015-09-16 (×4): 10 mg via ORAL
  Filled 2015-09-13 (×4): qty 1

## 2015-09-13 MED ORDER — ONDANSETRON HCL 4 MG PO TABS: 4.0000 mg | ORAL_TABLET | Freq: Four times a day (QID) | ORAL | Status: DC | PRN

## 2015-09-13 MED ORDER — ASPIRIN EC 81 MG PO TBEC
81.0000 mg | DELAYED_RELEASE_TABLET | Freq: Every day | ORAL | Status: DC
  Administered 2015-09-14 – 2015-09-17 (×4): 81 mg via ORAL
  Filled 2015-09-13 (×4): qty 1

## 2015-09-13 MED ORDER — SENNOSIDES-DOCUSATE SODIUM 8.6-50 MG PO TABS: 1.0000 | ORAL_TABLET | Freq: Every evening | ORAL | Status: DC | PRN

## 2015-09-13 MED ORDER — INSULIN ASPART 100 UNIT/ML ~~LOC~~ SOLN
0.0000 [IU] | Freq: Three times a day (TID) | SUBCUTANEOUS | Status: DC
  Administered 2015-09-14: 2 [IU] via SUBCUTANEOUS
  Administered 2015-09-14 (×2): 1 [IU] via SUBCUTANEOUS
  Administered 2015-09-15: 2 [IU] via SUBCUTANEOUS
  Administered 2015-09-15 – 2015-09-16 (×4): 1 [IU] via SUBCUTANEOUS
  Administered 2015-09-17: 2 [IU] via SUBCUTANEOUS
  Administered 2015-09-17: 1 [IU] via SUBCUTANEOUS

## 2015-09-13 MED ORDER — ACETAMINOPHEN 325 MG PO TABS
650.0000 mg | ORAL_TABLET | Freq: Four times a day (QID) | ORAL | Status: DC | PRN
  Administered 2015-09-13 – 2015-09-17 (×3): 650 mg via ORAL
  Filled 2015-09-13 (×3): qty 2

## 2015-09-13 MED ORDER — CLONIDINE HCL 0.1 MG PO TABS
0.1000 mg | ORAL_TABLET | Freq: Two times a day (BID) | ORAL | Status: DC
  Administered 2015-09-13 – 2015-09-17 (×8): 0.1 mg via ORAL
  Filled 2015-09-13 (×8): qty 1

## 2015-09-13 MED ORDER — SODIUM CHLORIDE 0.9 % IV SOLN
INTRAVENOUS | Status: DC
  Administered 2015-09-13 – 2015-09-17 (×4): via INTRAVENOUS

## 2015-09-13 MED ORDER — ACETAMINOPHEN 650 MG RE SUPP: 650.0000 mg | Freq: Four times a day (QID) | RECTAL | Status: DC | PRN

## 2015-09-13 MED ORDER — ATORVASTATIN CALCIUM 10 MG PO TABS
10.0000 mg | ORAL_TABLET | Freq: Every day | ORAL | Status: DC
  Administered 2015-09-14 – 2015-09-16 (×3): 10 mg via ORAL
  Filled 2015-09-13 (×3): qty 1

## 2015-09-13 MED ORDER — GABAPENTIN 300 MG PO CAPS
300.0000 mg | ORAL_CAPSULE | Freq: Three times a day (TID) | ORAL | Status: DC
  Administered 2015-09-13 – 2015-09-17 (×12): 300 mg via ORAL
  Filled 2015-09-13 (×13): qty 1

## 2015-09-13 MED ORDER — ONDANSETRON HCL 4 MG/2ML IJ SOLN: 4.0000 mg | Freq: Four times a day (QID) | INTRAMUSCULAR | Status: DC | PRN

## 2015-09-13 MED ORDER — ATORVASTATIN CALCIUM 10 MG PO TABS: 10.0000 mg | ORAL_TABLET | Freq: Every day | ORAL | Status: DC

## 2015-09-13 MED ORDER — LABETALOL HCL 200 MG PO TABS
200.0000 mg | ORAL_TABLET | Freq: Two times a day (BID) | ORAL | Status: DC
  Administered 2015-09-13 – 2015-09-17 (×8): 200 mg via ORAL
  Filled 2015-09-13 (×8): qty 1

## 2015-09-13 MED ORDER — HEPARIN SODIUM (PORCINE) 5000 UNIT/ML IJ SOLN
5000.0000 [IU] | Freq: Three times a day (TID) | INTRAMUSCULAR | Status: DC
  Administered 2015-09-13 – 2015-09-17 (×11): 5000 [IU] via SUBCUTANEOUS
  Filled 2015-09-13 (×12): qty 1

## 2015-09-13 MED ORDER — ISOSORBIDE MONONITRATE ER 60 MG PO TB24
60.0000 mg | ORAL_TABLET | Freq: Every day | ORAL | Status: DC
  Administered 2015-09-14 – 2015-09-16 (×3): 60 mg via ORAL
  Filled 2015-09-13 (×3): qty 1

## 2015-09-13 NOTE — Progress Notes (Signed)
VASCULAR LAB PRELIMINARY  PRELIMINARY  PRELIMINARY  PRELIMINARY  Right lower extremity venous duplex completed.    Preliminary report:  Right:  No evidence of DVT, superficial thrombosis, or Baker's cyst.  Cherisse Carrell, RVT 09/13/2015, 2:06 PM

## 2015-09-13 NOTE — ED Notes (Signed)
Admitting PA-C paged to this RN's phone

## 2015-09-13 NOTE — ED Notes (Signed)
To ED via [place] from [facility], with c/o altered LOC -- family states was told that pt  "was unconscious" -- on pt's arrival to ED pt was alert-- answers questions appropriately. Received Narcan 0.4mg  enroute.

## 2015-09-13 NOTE — ED Notes (Signed)
Spoke with Dr. Aggie Moats regarding pt's admission order, Dr. Aggie Moats with be updating pt's admission order

## 2015-09-13 NOTE — ED Notes (Signed)
Patient transported to X-ray 

## 2015-09-13 NOTE — ED Notes (Signed)
[facility] [place] notified of test

## 2015-09-13 NOTE — H&P (Signed)
[facility] History and Physical  TRISHIKA JANUS [id number] DOB: Aug 08, 1933 DOA: 09/13/2015  Referring physician:  PCP: Cathlean Cower, MD   Chief Complaint: Dizziness  HPI: Katie Dawson is a 82 y.o. female with history of diabetes type 2, hyperlipidemia, anemia, hypertension, OSA, dementia, brought by EMS today after a syncopal episode. No family is at bedside and patient is currently lethargic. Per chart, she  Has a history of "chronic dizziness" and aortic stenosis dating back 2010 per 2D echo. Apparently, the patient has been experiencing increasing dizziness, with 2 episodes of loss of consciousness at her living facility and subsequent fall. She denies any headaches nausea or vomiting with the symptoms.Of note, she was given increased dose of  Tramadol about 10 days ago due to leg pain felt to be due to neuropathy. In transit to the ER, she received Narcan with improvement of her symptoms. No shortness of breath or cough. No abdominal or pelvic pain. No recent travels or sick contacts.  At the ED, EKG shows sinus rhythm, with grade 253, QTc 429, without significant changes from prior.BP 114/75 mmHg  Pulse 66  Temp(Src) 97.2 F (36.2 C) (Rectal)  Resp 21  Wt 98.884 kg (218 lb)  SpO2 96%  She was hyponatremic with Na 148, and her white blood cells were slightly elevated 10.7. Hemoglobin is 10.5, with Hemoccult negative. She received IV fluids.  CT head without acute findings. She is being admitted for further workup.  Review of Systems:  See HPI for significant positives. All other systems were reviewed and are negative.  Past Medical History  Diagnosis Date  . DIABETES MELLITUS, TYPE II 04/22/2007  . HYPERLIPIDEMIA 04/22/2007  . GOUT 04/22/2007  . ANEMIA-IRON DEFICIENCY 04/22/2007  . ANXIETY 04/22/2007  . OBSTRUCTIVE SLEEP APNEA 04/22/2007  . HYPERTENSION 04/22/2007  . GERD 04/22/2007  . RENAL INSUFFICIENCY 04/22/2007  . 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  FOOT PAIN, LEFT 12/23/2007  . OSTEOPENIA 02/12/2009  . INTERMITTENT VERTIGO 04/20/2009  . HYPERSOMNIA 01/11/2009  . COLONIC POLYPS, HX OF 04/22/2007  . NEPHRECTOMY, HX OF 04/22/2007  . Urinary tract infection     hx of  . Right fibular fracture July 2013  . Dementia    Past Surgical History  Procedure Laterality Date  . S/p right nephrectomy    . S/p parathyroid surgury    . Orif wrist fracture  01/02/2012    Procedure: OPEN REDUCTION INTERNAL FIXATION (ORIF) WRIST FRACTURE;  Surgeon: Schuyler Amor, MD;  Location: [place];  Service: Orthopedics;  Laterality: Right;  Open Reduction Internal Fixation Right Distal Radius    Social History:  reports that she has never smoked. She has never used smokeless tobacco. She reports that she does not drink alcohol or use illicit drugs.  No Known Allergies  Family History  Problem Relation Age of Onset  . Heart disease Mother      Prior to Admission medications   Medication Sig Start Date End Date Taking? Authorizing Provider  amLODipine (NORVASC) 10 MG tablet Take 10 mg by mouth daily.   Yes Historical Provider, MD  aspirin EC 81 MG tablet Take 1 tablet (81 mg total) by mouth daily. 04/07/14  Yes Thurnell Lose, MD  atorvastatin (LIPITOR) 10 MG tablet Take 10 mg by mouth daily.   Yes Historical Provider, MD  BD PEN NEEDLE NANO U/F 32G X 4 MM MISC USE WITH LANTUS DAILY.   Yes Biagio Borg, MD  busPIRone (BUSPAR) 7.5 MG tablet Take  7.5 mg by mouth 3 (three) times daily.    Yes Historical Provider, MD  citalopram (CELEXA) 10 MG tablet Take 10 mg by mouth daily.   Yes Historical Provider, MD  cloNIDine (CATAPRES) 0.1 MG tablet Take 0.1 mg by mouth 2 (two) times daily.   Yes Historical Provider, MD  donepezil (ARICEPT) 10 MG tablet Take 1 tablet (10 mg total) by mouth at bedtime. Patient taking differently: Take 5 mg by mouth at bedtime.  09/16/13  Yes Biagio Borg, MD  gabapentin (NEURONTIN) 300 MG capsule Take 1 capsule (300 mg total) by mouth 3  (three) times daily. 08/31/15  Yes Biagio Borg, MD  glipiZIDE (GLUCOTROL XL) 5 MG 24 hr tablet Take 1 tablet (5 mg total) by mouth daily with breakfast. 02/07/14  Yes Biagio Borg, MD  glucose blood (BAYER CONTOUR NEXT TEST) test strip Use as instructed 09/16/13  Yes Biagio Borg, MD  hydrALAZINE (APRESOLINE) 25 MG tablet Take 1 tablet (25 mg total) by mouth every 8 (eight) hours. 04/07/14  Yes Thurnell Lose, MD  insulin glargine (LANTUS) 100 UNIT/ML injection Inject 0.6 mLs (60 Units total) into the skin at bedtime. Patient taking differently: Inject 16 Units into the skin at bedtime.  02/10/13 09/13/15 Yes Biagio Borg, MD  insulin lispro (HUMALOG) 100 UNIT/ML injection Inject 2 Units into the skin 3 (three) times daily before meals. Hold if pt not eating or sugar below 100   Yes Historical Provider, MD  Insulin Pen Needle (BD PEN NEEDLE NANO U/F) 32G X 4 MM MISC 1 Device by Does not apply route daily as needed (insulin).   Yes Historical Provider, MD  isosorbide mononitrate (IMDUR) 60 MG 24 hr tablet Take 60 mg by mouth at bedtime.   Yes Historical Provider, MD  labetalol (NORMODYNE) 200 MG tablet Take 200 mg by mouth 2 (two) times daily.   Yes Historical Provider, MD  Lancets MISC Use as directed 1 per day 09/16/13  Yes Biagio Borg, MD  risperiDONE (RISPERDAL) 0.5 MG tablet Take 0.5 mg by mouth at bedtime.   Yes Historical Provider, MD  traMADol (ULTRAM) 50 MG tablet Take 1 tablet (50 mg total) by mouth every 6 (six) hours as needed. 08/31/15  Yes Biagio Borg, MD   Physical Exam: Filed Vitals:   09/13/15 0926 09/13/15 1315  BP: 104/45 114/75  Pulse: 52 66  Temp:  97.2 F (36.2 C)  TempSrc:  Rectal  Resp: 17 21  Weight: 98.884 kg (218 lb)   SpO2: 97% 96%    Wt Readings from Last 3 Encounters:  09/13/15 98.884 kg (218 lb)  08/31/15 98.884 kg (218 lb)  04/05/14 108.5 kg (239 lb 3.2 oz)    General: Appears calm and comfortable, chronically ill appearing Eyes:  PERRL, EOMI, normal lids,  iris ENT: grossly normal hearing, lips & tongue Neck: no lymphadenopathy, masses or thyromegaly Cardiovascular: regular rate and rythm, 2/6 systolic murmur, rubs or gallops. trace lower extremity edema   Respiratory: clear to auscultation bilaterally, no wheezing, rhonhci or rales. Normal respiratory effort. Abdomen: soft,non-tender, normal bowel sounds Skin: no rash or induration seen on limited exam. No open lesions. Musculoskeletal:  grossly normal tone in both upper and lower extremities Psychiatric: grossly normal mood and affect, speech fluent and appropriate Neurologic: CN 2-12 grossly intact, moves all extremities in coordinated fashion.          Labs on Admission:  Basic Metabolic Panel:  Recent Labs Lab 09/13/15 [id number]  NA 148*  K 4.0  CL 111  CO2 27  GLUCOSE 154*  BUN 22*  CREATININE 1.47*  CALCIUM 9.5    Liver Function Tests:  Recent Labs Lab 09/13/15 0954  AST 13*  ALT 16  ALKPHOS 60  BILITOT 0.7  PROT 6.1*  ALBUMIN 2.9*   No results for input(s): LIPASE, AMYLASE in the last 168 hours. No results for input(s): AMMONIA in the last 168 hours.  CBC:  Recent Labs Lab 09/13/15 0954  WBC 10.7*  NEUTROABS 8.1*  HGB 10.5*  HCT 33.4*  MCV 92.8  PLT 282    Cardiac Enzymes:  Recent Labs Lab 09/13/15 0954  TROPONINI <0.03    BNP (last 3 results) No results for input(s): BNP in the last 8760 hours.  ProBNP (last 3 results) No results for input(s): PROBNP in the last 8760 hours.   Creatinine clearance cannot be calculated (Unknown ideal weight.)  CBG: No results for input(s): GLUCAP in the last 168 hours.  Radiological Exams on Admission: Ct Head Wo Contrast  09/13/2015  CLINICAL DATA:  Altered mental status this morning.  Fall. EXAM: CT HEAD WITHOUT CONTRAST TECHNIQUE: Contiguous axial images were obtained from the base of the skull through the vertex without intravenous contrast. COMPARISON:  Head MRI and CT 04/05/2014 FINDINGS: There is  no evidence of acute cortical infarct, intracranial hemorrhage, mass, midline shift, or extra-axial fluid collection. Mild generalized cerebral atrophy is unchanged. Hypodensities throughout the cerebral white matter do not appear significantly changed and are nonspecific but compatible with extensive chronic small vessel ischemic disease. Orbits are unremarkable. Calcified atherosclerosis is noted at the skullbase. There is a trace right mastoid effusion. The visualized paranasal sinuses are clear. No skull fracture is identified. IMPRESSION: 1. No evidence of acute intracranial abnormality. 2. Extensive chronic small vessel ischemic disease. Electronically Signed   By: Logan Bores M.D.   On: 09/13/2015 10:31    EKG: Independently reviewed.    Assessment/Plan Active Problems:   * No active hospital problems. *  Syncope, in the setting of increased pain medications, and possible orthostasis. Other differential may include worsening cardiac disease.  Labs, EKG unrevealing. CT head negative for acute changes. Last 2 D echo in 2010 showed AI with estimated ejectionfraction was in the range of 60% to 65%.  LE Dopplers normal. BS 154Received Narcan with some response.  admit for observation - Tele bed. Repeat EKG in am echocardiogram Hold narcotic meds Urinalysis pending Syncope admssiion orders used  Diabetes mellitus type 2 insulin dependent. Current Glucose 154   Lab Results  Component Value Date   HGBA1C 6.9* 08/31/2015  Hold oral meds - SSI  Peripheral Neuropathy. RLE dopplers negative for DVT RLE pain related to neuropathy.  Continue Neurontin  Anemia of chronic disease Current Hb 10.9. No bleeding issues noted.  No transfusion indicated at this time Recheck in am  Chronic kidney disease stage ,acute on chronic -  Minimize nephrotoxins and renally dose medications Lab Results  Component Value Date   CREATININE 1.47* 09/13/2015   CREATININE 1.23* 08/31/2015   CREATININE  0.86 04/05/2014  IVF should improve her Creatinine levels.     Hypertension BP 114/75 mmHg  Pulse 66  Temp(Src) 97.2 F (36.2 C) (Rectal)  Resp 21  Wt 98.884 kg (218 lb)  SpO2 96% Resume meds  Depression Continue Celexa  Deconditioning  PT/OT/Nutrition Evaluation  Code Status: Full Code  DVT Prophylaxis:Heparin   Family Communication:   No Family at bedside Disposition Plan: Pending  Improvement. Admitted for observation in tele bed. Expected LOS 24-48 hrs    [facility] E,PA-C [facility] [email or web address] Password [email or web address]

## 2015-09-13 NOTE — ED Provider Notes (Signed)
CSN: [id number]     Arrival date & time 09/13/15  [id number] History   First MD Initiated Contact with Patient 09/13/15 0940     Chief Complaint  Patient presents with  . Altered Mental Status     (Consider location/radiation/quality/duration/timing/severity/associated sxs/prior Treatment) HPI  This is a 82 year old female brought in by EMS for syncope. History is gathered from her daughters who are present at bedside. There is a level V caveat due to dementia The patient has a history of diabetes, hypertension, sleep apnea, bilateral chronic leg pain, dementia. Review of her EMR shows that she previously has been diagnosed with chronic dizziness and has had a 2-D echocardiogram that shows mild aortic stenosis in 2010. Patient apparently lost consciousness twice per family, who spoke directly with the staff at her living facility. EMS reports that they give her Narcan with improvement of her symptoms. According to family, the patient had a fall several weeks ago, about which they're concerned. She was apparently found on her floor sitting on her bottom. She seemed okay and no evaluation occurred. Approximately 2 weeks ago the patient developed sudden acute on chronic leg pain, worse on the right. Her granddaughter and daughter state that she was crying loudly and has since refused to walk since that time. Only uses a walker. She was evaluated by her primary care physician who did lab work and attributes her leg pain. 2. Chronic peripheral neuropathy. He increased her tramadol dose. The patient has been on the increased dose for about one week.  Past Medical History  Diagnosis Date  . DIABETES MELLITUS, TYPE II 04/22/2007  . HYPERLIPIDEMIA 04/22/2007  . GOUT 04/22/2007  . ANEMIA-IRON DEFICIENCY 04/22/2007  . ANXIETY 04/22/2007  . OBSTRUCTIVE SLEEP APNEA 04/22/2007  . HYPERTENSION 04/22/2007  . GERD 04/22/2007  . RENAL INSUFFICIENCY 04/22/2007  . 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 FOOT  PAIN, LEFT 12/23/2007  . OSTEOPENIA 02/12/2009  . INTERMITTENT VERTIGO 04/20/2009  . HYPERSOMNIA 01/11/2009  . COLONIC POLYPS, HX OF 04/22/2007  . NEPHRECTOMY, HX OF 04/22/2007  . Urinary tract infection     hx of  . Right fibular fracture July 2013  . Dementia    Past Surgical History  Procedure Laterality Date  . S/p right nephrectomy    . S/p parathyroid surgury    . Orif wrist fracture  01/02/2012    Procedure: OPEN REDUCTION INTERNAL FIXATION (ORIF) WRIST FRACTURE;  Surgeon: Schuyler Amor, MD;  Location: [place];  Service: Orthopedics;  Laterality: Right;  Open Reduction Internal Fixation Right Distal Radius    Family History  Problem Relation Age of Onset  . Heart disease Mother    Social History  Substance Use Topics  . Smoking status: Never Smoker   . Smokeless tobacco: Never Used  . Alcohol Use: No   OB History    No data available     Review of Systems   unable to review systems due to dementia   Allergies  Review of patient's allergies indicates no known allergies.  Home Medications   Prior to Admission medications   Medication Sig Start Date End Date Taking? Authorizing Provider  amLODipine (NORVASC) 10 MG tablet Take 10 mg by mouth daily.   Yes Historical Provider, MD  aspirin EC 81 MG tablet Take 1 tablet (81 mg total) by mouth daily. 04/07/14  Yes Thurnell Lose, MD  atorvastatin (LIPITOR) 10 MG tablet Take 10 mg by mouth daily.   Yes Historical Provider, MD  BD PEN  NEEDLE NANO U/F 32G X 4 MM MISC USE WITH LANTUS DAILY.   Yes Biagio Borg, MD  busPIRone (BUSPAR) 7.5 MG tablet Take 7.5 mg by mouth 3 (three) times daily.    Yes Historical Provider, MD  citalopram (CELEXA) 10 MG tablet Take 10 mg by mouth daily.   Yes Historical Provider, MD  cloNIDine (CATAPRES) 0.1 MG tablet Take 0.1 mg by mouth 2 (two) times daily.   Yes Historical Provider, MD  donepezil (ARICEPT) 10 MG tablet Take 1 tablet (10 mg total) by mouth at bedtime. Patient taking  differently: Take 5 mg by mouth at bedtime.  09/16/13  Yes Biagio Borg, MD  gabapentin (NEURONTIN) 300 MG capsule Take 1 capsule (300 mg total) by mouth 3 (three) times daily. 08/31/15  Yes Biagio Borg, MD  glipiZIDE (GLUCOTROL XL) 5 MG 24 hr tablet Take 1 tablet (5 mg total) by mouth daily with breakfast. 02/07/14  Yes Biagio Borg, MD  glucose blood (BAYER CONTOUR NEXT TEST) test strip Use as instructed 09/16/13  Yes Biagio Borg, MD  hydrALAZINE (APRESOLINE) 25 MG tablet Take 1 tablet (25 mg total) by mouth every 8 (eight) hours. 04/07/14  Yes Thurnell Lose, MD  insulin glargine (LANTUS) 100 UNIT/ML injection Inject 0.6 mLs (60 Units total) into the skin at bedtime. Patient taking differently: Inject 16 Units into the skin at bedtime.  02/10/13 09/13/15 Yes Biagio Borg, MD  insulin lispro (HUMALOG) 100 UNIT/ML injection Inject 2 Units into the skin 3 (three) times daily before meals. Hold if pt not eating or sugar below 100   Yes Historical Provider, MD  Insulin Pen Needle (BD PEN NEEDLE NANO U/F) 32G X 4 MM MISC 1 Device by Does not apply route daily as needed (insulin).   Yes Historical Provider, MD  isosorbide mononitrate (IMDUR) 60 MG 24 hr tablet Take 60 mg by mouth at bedtime.   Yes Historical Provider, MD  labetalol (NORMODYNE) 200 MG tablet Take 200 mg by mouth 2 (two) times daily.   Yes Historical Provider, MD  Lancets MISC Use as directed 1 per day 09/16/13  Yes Biagio Borg, MD  risperiDONE (RISPERDAL) 0.5 MG tablet Take 0.5 mg by mouth at bedtime.   Yes Historical Provider, MD  traMADol (ULTRAM) 50 MG tablet Take 1 tablet (50 mg total) by mouth every 6 (six) hours as needed. 08/31/15  Yes Biagio Borg, MD   BP 164/68 mmHg  Pulse 68  Temp(Src) 98.5 F (36.9 C) (Oral)  Resp 18  Ht 5\' 6"  (1.676 m)  Wt 94.4 kg  BMI 33.61 kg/m2  SpO2 96% Physical Exam  Constitutional: She is oriented to person, place, and time. She appears well-developed and well-nourished. No distress.  HENT:  Head:  Normocephalic and atraumatic.  Eyes: Conjunctivae are normal. No scleral icterus.  Neck: Normal range of motion.  Cardiovascular: Normal rate and regular rhythm.  Exam reveals no gallop and no friction rub.   Murmur heard. Crescendo decrescendo murmur heard best in the second ICS on the right side consistent with aortic stenosis.  Pulmonary/Chest: Effort normal and breath sounds normal. No respiratory distress.  Abdominal: Soft. Bowel sounds are normal. She exhibits no distension and no mass. There is no tenderness. There is no guarding.  Musculoskeletal:  Full passive range of motion of the bilateral ankles, knees and hips without severe pain. Normal DTRs. Pulses palpable  Neurological: She is alert and oriented to person, place, and time.  Skin:  Skin is warm and dry. She is not diaphoretic.  Nursing note and vitals reviewed.   ED Course  Procedures (including critical care time) Labs Review Labs Reviewed  CBC WITH DIFFERENTIAL/PLATELET - Abnormal; Notable for the following:    WBC 10.7 (*)    RBC 3.60 (*)    Hemoglobin 10.5 (*)    HCT 33.4 (*)    Neutro Abs 8.1 (*)    All other components within normal limits  COMPREHENSIVE METABOLIC PANEL - Abnormal; Notable for the following:    Sodium 148 (*)    Glucose, Bld 154 (*)    BUN 22 (*)    Creatinine, Ser 1.47 (*)    Total Protein 6.1 (*)    Albumin 2.9 (*)    AST 13 (*)    GFR calc non Af Amer 33 (*)    GFR calc Af Amer 38 (*)    All other components within normal limits  URINALYSIS, ROUTINE W REFLEX MICROSCOPIC (NOT AT [facility]) - Abnormal; Notable for the following:    APPearance CLOUDY (*)    Protein, ur 30 (*)    Leukocytes, UA LARGE (*)    All other components within normal limits  CBC - Abnormal; Notable for the following:    RBC 3.72 (*)    Hemoglobin 10.4 (*)    HCT 34.3 (*)    All other components within normal limits  CREATININE, SERUM - Abnormal; Notable for the following:    Creatinine, Ser 1.19 (*)    GFR calc  non Af Amer 42 (*)    GFR calc Af Amer 49 (*)    All other components within normal limits  URINE MICROSCOPIC-ADD ON - Abnormal; Notable for the following:    Squamous Epithelial / LPF 0-5 (*)    Bacteria, UA MANY (*)    Crystals TRIPLE PHOSPHATE CRYSTALS (*)    All other components within normal limits  COMPREHENSIVE METABOLIC PANEL - Abnormal; Notable for the following:    Sodium 148 (*)    Total Protein 6.1 (*)    Albumin 2.8 (*)    AST 11 (*)    ALT 12 (*)    GFR calc non Af Amer 57 (*)    All other components within normal limits  CBC - Abnormal; Notable for the following:    RBC 3.84 (*)    Hemoglobin 10.6 (*)    HCT 35.4 (*)    MCHC 29.9 (*)    All other components within normal limits  GLUCOSE, CAPILLARY - Abnormal; Notable for the following:    Glucose-Capillary 126 (*)    All other components within normal limits  GLUCOSE, CAPILLARY - Abnormal; Notable for the following:    Glucose-Capillary 165 (*)    All other components within normal limits  GLUCOSE, CAPILLARY - Abnormal; Notable for the following:    Glucose-Capillary 130 (*)    All other components within normal limits  BASIC METABOLIC PANEL - Abnormal; Notable for the following:    Sodium 147 (*)    Glucose, Bld 135 (*)    Creatinine, Ser 1.07 (*)    GFR calc non Af Amer 48 (*)    GFR calc Af Amer 56 (*)    All other components within normal limits  CBC - Abnormal; Notable for the following:    RBC 3.64 (*)    Hemoglobin 10.1 (*)    HCT 33.2 (*)    All other components within normal limits  GLUCOSE, CAPILLARY - Abnormal;  Notable for the following:    Glucose-Capillary 129 (*)    All other components within normal limits  GLUCOSE, CAPILLARY - Abnormal; Notable for the following:    Glucose-Capillary 139 (*)    All other components within normal limits  GLUCOSE, CAPILLARY - Abnormal; Notable for the following:    Glucose-Capillary 152 (*)    All other components within normal limits  GLUCOSE,  CAPILLARY - Abnormal; Notable for the following:    Glucose-Capillary 123 (*)    All other components within normal limits  GLUCOSE, CAPILLARY - Abnormal; Notable for the following:    Glucose-Capillary 160 (*)    All other components within normal limits  GLUCOSE, CAPILLARY - Abnormal; Notable for the following:    Glucose-Capillary 138 (*)    All other components within normal limits  GLUCOSE, CAPILLARY - Abnormal; Notable for the following:    Glucose-Capillary 130 (*)    All other components within normal limits  GLUCOSE, CAPILLARY - Abnormal; Notable for the following:    Glucose-Capillary 146 (*)    All other components within normal limits  CBG MONITORING, ED - Abnormal; Notable for the following:    Glucose-Capillary 110 (*)    All other components within normal limits  MRSA PCR SCREENING  URINE CULTURE  CULTURE, BLOOD (ROUTINE X 2)  CULTURE, BLOOD (ROUTINE X 2)  PROTIME-INR  TROPONIN I  GLUCOSE, CAPILLARY  POC OCCULT BLOOD, ED    Imaging Review No results found. I have personally reviewed and evaluated these images and lab results as part of my medical decision-making.   EKG Interpretation   Date/Time:  Thursday September 13 2015 08:57:55 EDT Ventricular Rate:  53 PR Interval:  236 QRS Duration: 98 QT Interval:  457 QTC Calculation: 429 R Axis:   5 Text Interpretation:  Sinus rhythm Prolonged PR interval Probable left  atrial enlargement Anteroseptal infarct, age indeterminate No significant  change was found Confirmed by Wyvonnia Dusky  MD, STEPHEN [phone]) on 09/13/2015  9:43:30 AM Also confirmed by Wyvonnia Dusky  MD, STEPHEN [phone]), editor  [place]  CCT, [place] ([id number])  on 09/13/2015 10:28:22 AM      MDM   Final diagnoses:  Syncope, unspecified syncope type  Aortic stenosis  Pain of right lower extremity    Patient here for apparent syncope. She has mild orthostasis. Differential includes worsening aortic stenosis, pulmonary embolus, GI bleed, subdural hematoma  . I suspect worsening of her aortic stenosis. She has no leg pain at this time.  Patient is hypernatremic. She has a slight elevation in her white blood cell count. Hemoglobin 10.5. FOBT is pending. II feel the patient will need admission for further work up.  CXR shows lung nodules with slightly increased size. CT is without acute abnormality . FOBT is negative. WIll admit for syncope.  Margarita Mail, PA-C 09/16/15 1640  Ezequiel Essex, MD 09/17/15 [phone]

## 2015-09-13 NOTE — ED Notes (Signed)
PA-C paged

## 2015-09-14 ENCOUNTER — Observation Stay (HOSPITAL_BASED_OUTPATIENT_CLINIC_OR_DEPARTMENT_OTHER): Payer: Medicare Other

## 2015-09-14 DIAGNOSIS — Z7982 Long term (current) use of aspirin: Secondary | ICD-10-CM | POA: Diagnosis not present

## 2015-09-14 DIAGNOSIS — I350 Nonrheumatic aortic (valve) stenosis: Secondary | ICD-10-CM | POA: Diagnosis present

## 2015-09-14 DIAGNOSIS — G4733 Obstructive sleep apnea (adult) (pediatric): Secondary | ICD-10-CM | POA: Diagnosis present

## 2015-09-14 DIAGNOSIS — N183 Chronic kidney disease, stage 3 (moderate): Secondary | ICD-10-CM | POA: Diagnosis present

## 2015-09-14 DIAGNOSIS — D638 Anemia in other chronic diseases classified elsewhere: Secondary | ICD-10-CM | POA: Diagnosis present

## 2015-09-14 DIAGNOSIS — E1122 Type 2 diabetes mellitus with diabetic chronic kidney disease: Secondary | ICD-10-CM | POA: Diagnosis present

## 2015-09-14 DIAGNOSIS — R55 Syncope and collapse: Secondary | ICD-10-CM

## 2015-09-14 DIAGNOSIS — I10 Essential (primary) hypertension: Secondary | ICD-10-CM

## 2015-09-14 DIAGNOSIS — Z794 Long term (current) use of insulin: Secondary | ICD-10-CM | POA: Diagnosis not present

## 2015-09-14 DIAGNOSIS — E785 Hyperlipidemia, unspecified: Secondary | ICD-10-CM

## 2015-09-14 DIAGNOSIS — E1142 Type 2 diabetes mellitus with diabetic polyneuropathy: Secondary | ICD-10-CM | POA: Diagnosis present

## 2015-09-14 DIAGNOSIS — N390 Urinary tract infection, site not specified: Secondary | ICD-10-CM | POA: Diagnosis present

## 2015-09-14 DIAGNOSIS — M8580 Other specified disorders of bone density and structure, unspecified site: Secondary | ICD-10-CM | POA: Diagnosis present

## 2015-09-14 DIAGNOSIS — F0390 Unspecified dementia without behavioral disturbance: Secondary | ICD-10-CM | POA: Diagnosis not present

## 2015-09-14 DIAGNOSIS — E870 Hyperosmolality and hypernatremia: Secondary | ICD-10-CM | POA: Diagnosis present

## 2015-09-14 DIAGNOSIS — F329 Major depressive disorder, single episode, unspecified: Secondary | ICD-10-CM | POA: Diagnosis present

## 2015-09-14 DIAGNOSIS — I129 Hypertensive chronic kidney disease with stage 1 through stage 4 chronic kidney disease, or unspecified chronic kidney disease: Secondary | ICD-10-CM | POA: Diagnosis present

## 2015-09-14 DIAGNOSIS — R5383 Other fatigue: Secondary | ICD-10-CM

## 2015-09-14 LAB — COMPREHENSIVE METABOLIC PANEL
ALT: 12 U/L — ABNORMAL LOW (ref 14–54)
AST: 11 U/L — ABNORMAL LOW (ref 15–41)
Albumin: 2.8 g/dL — ABNORMAL LOW (ref 3.5–5.0)
Alkaline Phosphatase: 60 U/L (ref 38–126)
Anion gap: 10 (ref 5–15)
BILIRUBIN TOTAL: 0.6 mg/dL (ref 0.3–1.2)
BUN: 14 mg/dL (ref 6–20)
CHLORIDE: 111 mmol/L (ref 101–111)
CO2: 27 mmol/L (ref 22–32)
CREATININE: 0.93 mg/dL (ref 0.44–1.00)
Calcium: 9.3 mg/dL (ref 8.9–10.3)
GFR, EST NON AFRICAN AMERICAN: 57 mL/min — AB (ref >60)
Glucose, Bld: 95 mg/dL (ref 65–99)
POTASSIUM: 3.5 mmol/L (ref 3.5–5.1)
Sodium: 148 mmol/L — ABNORMAL HIGH (ref 135–145)
TOTAL PROTEIN: 6.1 g/dL — AB (ref 6.5–8.1)

## 2015-09-14 LAB — ECHOCARDIOGRAM COMPLETE
HEIGHTINCHES: 66 in
Weight: 3319.25 oz

## 2015-09-14 LAB — GLUCOSE, CAPILLARY
GLUCOSE-CAPILLARY: 126 mg/dL — AB (ref 65–99)
GLUCOSE-CAPILLARY: 165 mg/dL — AB (ref 65–99)
Glucose-Capillary: 130 mg/dL — ABNORMAL HIGH (ref 65–99)
Glucose-Capillary: 129 mg/dL — ABNORMAL HIGH (ref 65–99)

## 2015-09-14 LAB — CBC
HEMATOCRIT: 35.4 % — AB (ref 36.0–46.0)
Hemoglobin: 10.6 g/dL — ABNORMAL LOW (ref 12.0–15.0)
MCH: 27.6 pg (ref 26.0–34.0)
MCHC: 29.9 g/dL — ABNORMAL LOW (ref 30.0–36.0)
MCV: 92.2 fL (ref 78.0–100.0)
PLATELETS: 269 10*3/uL (ref 150–400)
RBC: 3.84 MIL/uL — AB (ref 3.87–5.11)
RDW: 14.4 % (ref 11.5–15.5)
WBC: 7.5 10*3/uL (ref 4.0–10.5)

## 2015-09-14 MED ORDER — DEXTROSE 5 % IV SOLN
1.0000 g | INTRAVENOUS | Status: DC
  Administered 2015-09-14 – 2015-09-17 (×4): 1 g via INTRAVENOUS
  Filled 2015-09-14 (×4): qty 10

## 2015-09-14 NOTE — Progress Notes (Signed)
  Echocardiogram 2D Echocardiogram has been performed.  Jennette Dubin 09/14/2015, 1:44 PM

## 2015-09-14 NOTE — Care Management Obs Status (Signed)
[facility] NOTIFICATION   Patient Details  Name: Katie Dawson MRN: [id number] Date of Birth: [birth year]   Medicare Observation Status Notification Given:  Yes    Carles Collet, RN 09/14/2015, 9:31 AM

## 2015-09-14 NOTE — Progress Notes (Signed)
PROGRESS NOTE    Katie Dawson  [id number] DOB: 1934/02/24 DOA: 09/13/2015 PCP: Cathlean Cower, MD  Outpatient Specialists:    Brief Narrative:  82 y.o. female with history of diabetes type 2, hyperlipidemia, anemia, hypertension, OSA, dementia, brought by EMS today after a syncopal episode   Assessment & Plan:   Principal Problem:   Lethargy Active Problems:   Insulin dependent diabetes mellitus ([place])   HLD (hyperlipidemia)   Sleep apnea   Essential hypertension   Aortic stenosis   Dementia   Dizziness   Syncope, Labs, EKG unrevealing. CT head negative for acute changes. Last 2 D echo in 2010 showed AI with estimated ejectionfraction was in the range of 60% to 65%. LE Dopplers normal. BS 154Received Narcan with some response.  Echocardiogram done, pending results Hold narcotic meds as tolerated Urinalysis appears suggestive of UTI with TNTC WBC, large leuks and many bacteria. Urine cx pending Cont on empiric rocephin  Diabetes mellitus type 2 insulin dependent. Current Glucose 154   Recent Labs    Lab Results  Component Value Date   HGBA1C 6.9* 08/31/2015    Holding oral meds - SSI  Peripheral Neuropathy. RLE dopplers negative for DVT RLE pain related to neuropathy.  Continue Neurontin  Anemia of chronic disease Current Hb 10.9. No bleeding issues noted.  No transfusion indicated at this time Recheck in am  Chronic kidney disease stage ,acute on chronic - Minimize nephrotoxins and renally dose medications  Recent Labs    Lab Results  Component Value Date   CREATININE 1.47* 09/13/2015   CREATININE 1.23* 08/31/2015   CREATININE 0.86 04/05/2014    IVF should improve her Creatinine levels.    Hypertension BP 114/75 mmHg  Pulse 66  Temp(Src) 97.2 F (36.2 C) (Rectal)  Resp 21  Wt 98.884 kg (218 lb)  SpO2 96% Resumed meds  Depression Continue Celexa  Deconditioning  PT/OT/Nutrition Evaluation  UTI without  sepsis - UA is suggestive of UTi - Urine and blood cx pending - Started empiric rocephin        DVT prophylaxis: Heparin subQ Code Status: Full Family Communication: Pt in room Disposition Plan: Possible d/c home in 24-48hrs   Consultants:     Procedures:     Antimicrobials:   Rocephin 4/14>>>    Subjective: States feeling better today  Objective: Filed Vitals:   09/14/15 0434 09/14/15 0951 09/14/15 1420 09/14/15 1739  BP: 178/64 176/65 151/57 150/102  Pulse: 66 68 64 73  Temp: 98.4 F (36.9 C)  98.2 F (36.8 C) 99.2 F (37.3 C)  TempSrc: Oral  Oral   Resp: 16  18 18   Height:      Weight: 94.1 kg (207 lb 7.3 oz)     SpO2: 94%  95% 94%   No intake or output data in the 24 hours ending 09/14/15 1749 Filed Weights   09/13/15 0926 09/13/15 2009 09/14/15 0434  Weight: 98.884 kg (218 lb) 94.1 kg (207 lb 7.3 oz) 94.1 kg (207 lb 7.3 oz)    Examination:  General exam: Appears calm and comfortable  Respiratory system: Clear to auscultation. Respiratory effort normal. Cardiovascular system: S1 & S2 heard, RRR. No pedal edema. Gastrointestinal system: Abdomen is nondistended, soft and nontender. No organomegaly or masses felt. Normal bowel sounds heard. Central nervous system: Alert and oriented. No focal neurological deficits. Extremities: Symmetric 5 x 5 power. Skin: No rashes, lesions or ulcers Psychiatry: Judgement and insight appear normal. Mood & affect appropriate.  Data Reviewed: I have personally reviewed following labs and imaging studies  CBC:  Recent Labs Lab 09/13/15 0954 09/13/15 1745 09/14/15 0402  WBC 10.7* 7.8 7.5  NEUTROABS 8.1*  --   --   HGB 10.5* 10.4* 10.6*  HCT 33.4* 34.3* 35.4*  MCV 92.8 92.2 92.2  PLT 282 262 [id number]   Basic Metabolic Panel:  Recent Labs Lab 09/13/15 0954 09/13/15 1745 09/14/15 0402  NA 148*  --  148*  K 4.0  --  3.5  CL 111  --  111  CO2 27  --  27  GLUCOSE 154*  --  95  BUN 22*  --  14    CREATININE 1.47* 1.19* 0.93  CALCIUM 9.5  --  9.3   GFR: Estimated Creatinine Clearance: 56.7 mL/min (by C-G formula based on Cr of 0.93). Liver Function Tests:  Recent Labs Lab 09/13/15 0954 09/14/15 0402  AST 13* 11*  ALT 16 12*  ALKPHOS 60 60  BILITOT 0.7 0.6  PROT 6.1* 6.1*  ALBUMIN 2.9* 2.8*   No results for input(s): LIPASE, AMYLASE in the last 168 hours. No results for input(s): AMMONIA in the last 168 hours. Coagulation Profile:  Recent Labs Lab 09/13/15 0954  INR 1.16   Cardiac Enzymes:  Recent Labs Lab 09/13/15 0954  TROPONINI <0.03   BNP (last 3 results) No results for input(s): PROBNP in the last 8760 hours. HbA1C: No results for input(s): HGBA1C in the last 72 hours. CBG:  Recent Labs Lab 09/13/15 1714 09/13/15 2154 09/14/15 0812 09/14/15 1153 09/14/15 1734  GLUCAP 110* 77 126* 165* 130*   Lipid Profile: No results for input(s): CHOL, HDL, LDLCALC, TRIG, CHOLHDL, LDLDIRECT in the last 72 hours. Thyroid Function Tests: No results for input(s): TSH, T4TOTAL, FREET4, T3FREE, THYROIDAB in the last 72 hours. Anemia Panel: No results for input(s): VITAMINB12, FOLATE, FERRITIN, TIBC, IRON, RETICCTPCT in the last 72 hours. Urine analysis:    Component Value Date/Time   COLORURINE YELLOW 09/13/2015 1536   APPEARANCEUR CLOUDY* 09/13/2015 1536   LABSPEC 1.014 09/13/2015 1536   PHURINE 7.5 09/13/2015 1536   GLUCOSEU NEGATIVE 09/13/2015 1536   GLUCOSEU NEGATIVE 02/10/2013 1024   HGBUR NEGATIVE 09/13/2015 1536   BILIRUBINUR NEGATIVE 09/13/2015 1536   KETONESUR NEGATIVE 09/13/2015 1536   PROTEINUR 30* 09/13/2015 1536   UROBILINOGEN 1.0 04/05/2014 1220   NITRITE NEGATIVE 09/13/2015 1536   LEUKOCYTESUR LARGE* 09/13/2015 1536   Sepsis Labs: @LABRCNTIP (procalcitonin:4,lacticidven:4)  ) Recent Results (from the past 240 hour(s))  MRSA PCR Screening     Status: None   Collection Time: 09/13/15  8:41 PM  Result Value Ref Range Status   MRSA by  PCR NEGATIVE NEGATIVE Final    Comment:        The GeneXpert MRSA Assay (FDA approved for NASAL specimens only), is one component of a comprehensive MRSA colonization surveillance program. It is not intended to diagnose MRSA infection nor to guide or monitor treatment for MRSA infections.          Radiology Studies: X-ray Chest Pa And Lateral  09/13/2015  CLINICAL DATA:  Syncope.  Altered mental status for 1 day. EXAM: CHEST  2 VIEW COMPARISON:  04/05/2014 FINDINGS: The cardiac silhouette remains mildly enlarged. Thoracic aortic atherosclerosis is noted. Bilateral lung nodules are again seen. The largest is in the left upper lobe and measures approximately 2.3 cm, possibly slightly enlarged from the prior radiograph. There is mild central airway thickening. No segmental airspace consolidation, edema, pleural effusion, or pneumothorax  is identified. Surgical clips are noted in the lower neck. No acute osseous abnormality is identified. IMPRESSION: 1. Mild bronchitic changes.  No evidence of pneumonia. 2. Bilateral lung nodules. The apical left upper lobe nodule may have slightly increased in size. Electronically Signed   By: Logan Bores M.D.   On: 09/13/2015 16:34   Ct Head Wo Contrast  09/13/2015  CLINICAL DATA:  Altered mental status this morning.  Fall. EXAM: CT HEAD WITHOUT CONTRAST TECHNIQUE: Contiguous axial images were obtained from the base of the skull through the vertex without intravenous contrast. COMPARISON:  Head MRI and CT 04/05/2014 FINDINGS: There is no evidence of acute cortical infarct, intracranial hemorrhage, mass, midline shift, or extra-axial fluid collection. Mild generalized cerebral atrophy is unchanged. Hypodensities throughout the cerebral white matter do not appear significantly changed and are nonspecific but compatible with extensive chronic small vessel ischemic disease. Orbits are unremarkable. Calcified atherosclerosis is noted at the skullbase. There is a  trace right mastoid effusion. The visualized paranasal sinuses are clear. No skull fracture is identified. IMPRESSION: 1. No evidence of acute intracranial abnormality. 2. Extensive chronic small vessel ischemic disease. Electronically Signed   By: Logan Bores M.D.   On: 09/13/2015 10:31        Scheduled Meds: . amLODipine  10 mg Oral Daily  . aspirin EC  81 mg Oral Daily  . atorvastatin  10 mg Oral q1800  . cefTRIAXone (ROCEPHIN)  IV  1 g Intravenous Q24H  . citalopram  10 mg Oral Daily  . cloNIDine  0.1 mg Oral BID  . donepezil  10 mg Oral QHS  . gabapentin  300 mg Oral TID  . heparin  5,000 Units Subcutaneous 3 times per day  . insulin aspart  0-9 Units Subcutaneous TID WC  . isosorbide mononitrate  60 mg Oral QHS  . labetalol  200 mg Oral BID   Continuous Infusions: . sodium chloride 75 mL/hr at 09/13/15 1620        Donovan Gatchel, Orpah Melter, MD [facility] Pager [phone]  If 7PM-7AM, please contact night-coverage [email or web address] Password [facility] 09/14/2015, 5:49 PM

## 2015-09-14 NOTE — Progress Notes (Signed)
Pharmacy Antibiotic Note  Katie Dawson is a 82 y.o. female admitted on 09/13/2015 with UTI.  Pharmacy has been consulted for CTX dosing. AF, wbc wnl.  Plan: CTX 1g IV q24h F/u clinical progress, c/s, LOT Not renally adjusted - Rx will sign off  Height: 5\' 6"  (167.6 cm) Weight: 207 lb 7.3 oz (94.1 kg) IBW/kg (Calculated) : 59.3  Temp (24hrs), Avg:98.4 F (36.9 C), Min:97.2 F (36.2 C), Max:99 F (37.2 C)   Recent Labs Lab 09/13/15 0954 09/13/15 1745 09/14/15 0402  WBC 10.7* 7.8 7.5  CREATININE 1.47* 1.19* 0.93    Estimated Creatinine Clearance: 56.7 mL/min (by C-G formula based on Cr of 0.93).    No Known Allergies  Antimicrobials this admission: CTX 4/14 >>   Dose adjustments this admission: n/a  Microbiology results: 4/13 BCx:  4/13 UCx:   4/13 MRSA PCR: neg  Elicia Lamp, PharmD, BCPS Clinical Pharmacist Pager [phone] 09/14/2015 8:27 AM

## 2015-09-14 NOTE — Care Management Note (Addendum)
Case Management Note  Patient Details  Name: Katie Dawson MRN: [id number] Date of Birth: 1934-03-27  Subjective/Objective:                 Patient from  retirement in [place] for lethargy. PT consult pending.   Action/Plan:  Anticipate DC to ALF with [facility] PT Referral made to [facility]  Expected Discharge Date:                  Expected Discharge Plan:   ([place])  In-House Referral:  Clinical Social Work  Discharge planning Services  CM Consult  Post Acute Care Choice:    Choice offered to:     DME Arranged:    DME Agency:     HH Arranged:    [place] Agency:     Status of Service:  In process, will continue to follow  Medicare Important Message Given:    Date Medicare IM Given:    Medicare IM give by:    Date Additional Medicare IM Given:    Additional Medicare Important Message give by:     If discussed at [place] of Stay Meetings, dates discussed:    Additional Comments:  Carles Collet, RN 09/14/2015, 9:37 AM

## 2015-09-14 NOTE — Evaluation (Signed)
Physical Therapy Evaluation Patient Details Name: Katie Dawson MRN: [id number] DOB: 11-14-1933 Today's Date: 09/14/2015   History of Present Illness  Pt adm after syncopal episode at [facility]. Pt initially lethargic. Given Narcan in route with improvement. PMH - DM, neuropathy, leg pain, HTN, dementia  Clinical Impression  Pt admitted with above diagnosis and presents to PT with functional limitations due to deficits listed below (See PT problem list). Pt needs skilled PT to maximize independence and safety to allow discharge to back to ALF if they can continue to provide physical assist pt is requiring. If they can provide this assist unsure of other options since pt doesn't appear to have qualifying stay.     Follow Up Recommendations Home health PT (at ALF if facility can continue providing needed assist)    Equipment Recommendations  None recommended by PT    Recommendations for Other Services       Precautions / Restrictions Precautions Precautions: Fall      Mobility  Bed Mobility Overal bed mobility: Needs Assistance Bed Mobility: Supine to Sit;Sit to Supine;Rolling Rolling: Max assist   Supine to sit: Max assist Sit to supine: Max assist   General bed mobility comments: Assist to move legs and elevate trunk into sitting. Assist to bring legs back up into bed and lower trunk into supine  Transfers                 General transfer comment: Pt unable due to pain in legs  Ambulation/Gait                Stairs            Wheelchair Mobility    Modified Rankin (Stroke Patients Only)       Balance Overall balance assessment: Needs assistance Sitting-balance support: No upper extremity supported;Feet supported Sitting balance-Leahy Scale: Fair                                       Pertinent Vitals/Pain Pain Assessment: Faces Faces Pain Scale: Hurts even more Pain Location: legs. Right greater than  left Pain Descriptors / Indicators: Aching Pain Intervention(s): Limited activity within patient's tolerance;Monitored during session;Repositioned;Patient requesting pain meds-RN notified    Home Living Family/patient expects to be discharged to:: Assisted living               Home Equipment: Walker - 2 wheels;Cane - single point Additional Comments: Equipment per previous encounter.    Prior Function Level of Independence: Needs assistance   Gait / Transfers Assistance Needed: Per chart pt has been requiring assist for mobility for at least 3 months. Unsure how much pt has been able to amb           Hand Dominance        Extremity/Trunk Assessment   Upper Extremity Assessment: Defer to OT evaluation           Lower Extremity Assessment: RLE deficits/detail;LLE deficits/detail RLE Deficits / Details: grossly 3-/5 LLE Deficits / Details: grossly 3-/5     Communication   Communication: HOH  Cognition Arousal/Alertness: Awake/alert Behavior During Therapy: WFL for tasks assessed/performed Overall Cognitive Status: No family/caregiver present to determine baseline cognitive functioning Area of Impairment: Orientation;Attention;Memory;Following commands;Problem solving Orientation Level: Disoriented to;Place;Time;Situation Current Attention Level: Sustained Memory: Decreased short-term memory Following Commands: Follows one step commands consistently     Problem Solving: Slow processing;Decreased  initiation;Requires verbal cues;Requires tactile cues      General Comments      Exercises        Assessment/Plan    PT Assessment Patient needs continued PT services  PT Diagnosis Difficulty walking;Generalized weakness;Other (comment) (chronic pain)   PT Problem List Decreased strength;Decreased activity tolerance;Decreased balance;Decreased mobility;Pain  PT Treatment Interventions DME instruction;Gait training;Functional mobility training;Therapeutic  activities;Therapeutic exercise;Balance training;Patient/family education   PT Goals (Current goals can be found in the Care Plan section) Acute Rehab PT Goals Patient Stated Goal: Pt unable to state PT Goal Formulation: Patient unable to participate in goal setting Time For Goal Achievement: 09/28/15 Potential to Achieve Goals: Fair    Frequency Min 3X/week   Barriers to discharge        Co-evaluation               End of Session Equipment Utilized During Treatment: Gait belt Activity Tolerance: Patient limited by pain Patient left: in bed;with call bell/phone within reach;with bed alarm set Nurse Communication: Mobility status    Functional Assessment Tool Used: clincal judgement Functional Limitation: Mobility: Walking and moving around Mobility: Walking and Moving Around Current Status [phone]): At least 80 percent but less than 100 percent impaired, limited or restricted Mobility: Walking and Moving Around Goal Status [phone]): At least 40 percent but less than 60 percent impaired, limited or restricted    Time: 0748-0815 PT Time Calculation (min) (ACUTE ONLY): 27 min   Charges:   PT Evaluation $PT Eval Moderate Complexity: 1 Procedure PT Treatments $Therapeutic Activity: 8-22 mins   PT G Codes:   PT G-Codes **NOT FOR INPATIENT CLASS** Functional Assessment Tool Used: clincal judgement Functional Limitation: Mobility: Walking and moving around Mobility: Walking and Moving Around Current Status [id number]): At least 80 percent but less than 100 percent impaired, limited or restricted Mobility: Walking and Moving Around Goal Status [phone]): At least 40 percent but less than 60 percent impaired, limited or restricted    [facility] 09/14/2015, 8:39 AM [place]

## 2015-09-14 NOTE — Clinical Social Work Note (Signed)
Clinical Social Work Assessment  Patient Details  Name: Katie Dawson MRN: [id number] Date of Birth: 1934-04-10  Date of referral:  09/14/15               Reason for consult:  Facility Placement                Permission sought to share information with:  Facility Sport and exercise psychologist, Family Supports Permission granted to share information::  No (Patient disoriented; completed assessment with daughter, Katie Dawson.)  Name::     Musician  Agency::  [facility]  Relationship::  Daughter  Contact Information:  [phone]  Housing/Transportation Living arrangements for the past 2 months:  [place] of Information:  Adult Children Patient Interpreter Needed:  None Criminal Activity/Legal Involvement Pertinent to Current Situation/Hospitalization:  No - Comment as needed Significant Relationships:  Adult Children Lives with:  Self Do you feel safe going back to the place where you live?  Yes Need for family participation in patient care:  Yes (Comment)  Care giving concerns:  CSW received referral for patient to return to ALF at discharge. Patient is disoriented. CSW spoke with patient's daughter, Katie Dawson. Patient is from [place].   Social Worker assessment / plan:  Per patient's daughter, plan is for patient to return to ALF at discharge by [place].  Employment status:  Retired Forensic scientist:  Medicare PT Recommendations:  Home with [place] / Referral to community resources:     Patient/Family's Response to care:  Patient's daughter is hopeful that her mother will feel better and be able to return to her ALF at baseline function.  Patient/Family's Understanding of and Emotional Response to Diagnosis, Current Treatment, and Prognosis:  No questions or concerns at this time.  Emotional Assessment Appearance:  Appears stated age Attitude/Demeanor/Rapport:  Unable to Assess Affect (typically observed):   Unable to Assess Orientation:  Oriented to Self, Oriented to Place Alcohol / Substance use:  Not Applicable Psych involvement (Current and /or in the community):  No (Comment)  Discharge Needs  Concerns to be addressed:  Care Coordination Readmission within the last 30 days:  No Current discharge risk:  None Barriers to Discharge:  Continued Medical Work up   [employer], Latanya Presser 09/14/2015, 12:31 PM

## 2015-09-14 NOTE — Evaluation (Signed)
Occupational Therapy Evaluation Patient Details Name: Katie Dawson MRN: [id number] DOB: 1933/10/12 Today's Date: 09/14/2015    History of Present Illness Pt adm after syncopal episode at [facility]. Pt initially lethargic. Given Narcan in route with improvement. PMH - DM, neuropathy, leg pain, HTN, dementia   Clinical Impression   Pt with baseline dependence in self care. She is able to feed herself and participate in some grooming tasks. Pt requiring significant assistance to achieve EOB sitting. Complains of R LE. Not able to attempt standing.  No acute OT needs. Recommend up with lift equipment and nursing staff.   Follow Up Recommendations  No OT follow up;Supervision/Assistance - 24 hour (resume HHPT at ALF)    Equipment Recommendations  None recommended by OT    Recommendations for Other Services       Precautions / Restrictions Precautions Precautions: Fall      Mobility Bed Mobility Overal bed mobility: Needs Assistance Bed Mobility: Supine to Sit;Sit to Supine Rolling: Max assist   Supine to sit: Max assist Sit to supine: Max assist   General bed mobility comments: assist for all aspects, used pad to advance hips  Transfers                 General transfer comment: Pt unable due to pain in legs    Balance Overall balance assessment: Needs assistance Sitting-balance support: No upper extremity supported;Feet supported Sitting balance-Leahy Scale: Fair Sitting balance - Comments: sits without UE support                                    ADL Overall ADL's : Needs assistance/impaired Eating/Feeding: Set up;Bed level   Grooming: Wash/dry hands;Set up;Bed level                                 General ADL Comments: Pt requires max to total assist for bathing, dressing and toileting and is incontinent.      Vision Additional Comments: grossly intact   Perception     Praxis      Pertinent  Vitals/Pain Pain Assessment: Faces Faces Pain Scale: Hurts even more Pain Location: R LE Pain Descriptors / Indicators: Aching Pain Intervention(s): Limited activity within patient's tolerance;Monitored during session (performed ROM)     Hand Dominance Right   Extremity/Trunk Assessment Upper Extremity Assessment Upper Extremity Assessment: Generalized weakness   Lower Extremity Assessment Lower Extremity Assessment: Defer to PT evaluation RLE Deficits / Details: grossly 3-/5 RLE Sensation: history of peripheral neuropathy LLE Deficits / Details: grossly 3-/5 LLE Sensation: history of peripheral neuropathy       Communication Communication Communication: HOH   Cognition Arousal/Alertness: Awake/alert Behavior During Therapy: Flat affect Overall Cognitive Status: History of cognitive impairments - at baseline Area of Impairment: Orientation;Attention;Memory;Following commands;Problem solving Orientation Level: Disoriented to;Place;Time;Situation Current Attention Level: Sustained Memory: Decreased short-term memory Following Commands: Follows one step commands consistently     Problem Solving: Slow processing;Decreased initiation;Requires verbal cues;Requires tactile cues     General Comments       Exercises       Shoulder Instructions      Home Living Family/patient expects to be discharged to:: Assisted living [facility])  Home Equipment: [place] - 2 wheels;Cane - single point;Wheelchair - manual   Additional Comments: Per Tammy at [facility].      Prior Functioning/Environment Level of Independence: Needs assistance  Gait / Transfers Assistance Needed: Per Tammy, pt with decreasing mobility x several months. Some days she can ambulate to the dining room with assist and her walker and sometimes requires w/c use. ADL's / Homemaking Assistance Needed: Dependent in bathing, dressing, incontinent. Pt able to self  feed and perform some grooming.        OT Diagnosis: Generalized weakness;Cognitive deficits;Acute pain   OT Problem List:     OT Treatment/Interventions:      OT Goals(Current goals can be found in the care plan section) Acute Rehab OT Goals Patient Stated Goal: pain relief  OT Frequency:     Barriers to D/C:            Co-evaluation              End of Session Nurse Communication: Patient requests pain meds  Activity Tolerance: Patient limited by pain Patient left: in bed;with call bell/phone within reach;with bed alarm set   Time: [id number] OT Time Calculation (min): 18 min Charges:  OT General Charges $OT Visit: 1 Procedure OT Evaluation $OT Eval Moderate Complexity: 1 Procedure G-Codes: OT G-codes **NOT FOR INPATIENT CLASS** Functional Assessment Tool Used: clinical judgement Functional Limitation: Self care Self Care Current Status [id number]): At least 80 percent but less than 100 percent impaired, limited or restricted Self Care Goal Status [id number]): At least 80 percent but less than 100 percent impaired, limited or restricted Self Care Discharge Status [id number]): At least 80 percent but less than 100 percent impaired, limited or restricted  [name] 09/14/2015, 9:34 AM  [phone]

## 2015-09-15 LAB — BASIC METABOLIC PANEL
Anion gap: 11 (ref 5–15)
BUN: 16 mg/dL (ref 6–20)
CALCIUM: 9.3 mg/dL (ref 8.9–10.3)
CO2: 25 mmol/L (ref 22–32)
CREATININE: 1.07 mg/dL — AB (ref 0.44–1.00)
Chloride: 111 mmol/L (ref 101–111)
GFR calc non Af Amer: 48 mL/min — ABNORMAL LOW (ref >60)
GFR, EST AFRICAN AMERICAN: 56 mL/min — AB (ref >60)
Glucose, Bld: 135 mg/dL — ABNORMAL HIGH (ref 65–99)
Potassium: 3.8 mmol/L (ref 3.5–5.1)
SODIUM: 147 mmol/L — AB (ref 135–145)

## 2015-09-15 LAB — CBC
HCT: 33.2 % — ABNORMAL LOW (ref 36.0–46.0)
Hemoglobin: 10.1 g/dL — ABNORMAL LOW (ref 12.0–15.0)
MCH: 27.7 pg (ref 26.0–34.0)
MCHC: 30.4 g/dL (ref 30.0–36.0)
MCV: 91.2 fL (ref 78.0–100.0)
PLATELETS: 264 10*3/uL (ref 150–400)
RBC: 3.64 MIL/uL — ABNORMAL LOW (ref 3.87–5.11)
RDW: 14.3 % (ref 11.5–15.5)
WBC: 8 10*3/uL (ref 4.0–10.5)

## 2015-09-15 LAB — GLUCOSE, CAPILLARY
GLUCOSE-CAPILLARY: 123 mg/dL — AB (ref 65–99)
Glucose-Capillary: 139 mg/dL — ABNORMAL HIGH (ref 65–99)
Glucose-Capillary: 152 mg/dL — ABNORMAL HIGH (ref 65–99)
Glucose-Capillary: 160 mg/dL — ABNORMAL HIGH (ref 65–99)

## 2015-09-15 MED ORDER — VANCOMYCIN HCL IN DEXTROSE 750-5 MG/150ML-% IV SOLN
750.0000 mg | Freq: Two times a day (BID) | INTRAVENOUS | Status: DC
  Administered 2015-09-15 – 2015-09-17 (×5): 750 mg via INTRAVENOUS
  Filled 2015-09-15 (×7): qty 150

## 2015-09-15 NOTE — Progress Notes (Signed)
PROGRESS NOTE    Katie Dawson  [id number] DOB: Feb 23, 1934 DOA: 09/13/2015 PCP: Cathlean Cower, MD  Outpatient Specialists:    Brief Narrative:  82 y.o. female with history of diabetes type 2, hyperlipidemia, anemia, hypertension, OSA, dementia, brought by EMS after a syncopal episode   Assessment & Plan:   Principal Problem:   Lethargy Active Problems:   Insulin dependent diabetes mellitus ([place])   HLD (hyperlipidemia)   Sleep apnea   Essential hypertension   Aortic stenosis   Dementia   Dizziness   Syncope, Labs, EKG unrevealing. CT head negative for acute changes. Last 2 D echo in 2010 showed AI with estimated ejectionfraction was in the range of 60% to 65%. LE Dopplers normal. BS 154Received Narcan with some response.  Echocardiogram done, EF 55% with grade 2 diastolic dysfunction Holding narcotic meds as tolerated Urinalysis appears suggestive of UTI with TNTC WBC, large leuks and many bacteria. Urine cx pending Per staff, on I/O cath to obtain urine cx, gross purulence was noted Cont on empiric rocephin  Diabetes mellitus type 2 insulin dependent. Current Glucose 154   Recent Labs    Lab Results  Component Value Date   HGBA1C 6.9* 08/31/2015    Holding oral meds - SSI  Peripheral Neuropathy. RLE dopplers negative for DVT RLE pain related to neuropathy.  Continue Neurontin  Anemia of chronic disease Current Hb 10.9. No bleeding issues noted.  No transfusion indicated at this time  Chronic kidney disease stage ,acute on chronic - Minimize nephrotoxins and renally dose medications  Recent Labs    Lab Results  Component Value Date   CREATININE 1.47* 09/13/2015   CREATININE 1.23* 08/31/2015   CREATININE 0.86 04/05/2014     Cr improved since admission, remains stable  Hypertension BP 114/75 mmHg  Pulse 66  Temp(Src) 97.2 F (36.2 C) (Rectal)  Resp 21  Wt 98.884 kg (218 lb)  SpO2 96% Resumed  meds  Depression Continue Celexa  Deconditioning  PT/OT/Nutrition Evaluation  UTI without sepsis - UA is suggestive of UTi - Urine and blood cx pending - for now, continue empiric rocephin      DVT prophylaxis: Heparin subQ Code Status: Full Family Communication: Pt in room Disposition Plan: Possible d/c home in 24-48hrs   Consultants:     Procedures:     Antimicrobials:   Rocephin 4/14>>>    Subjective: Patient reports feeling better  Objective: Filed Vitals:   09/14/15 2147 09/15/15 0500 09/15/15 0558 09/15/15 1347  BP: 160/63  154/61 145/55  Pulse: 67  62 60  Temp: 98.8 F (37.1 C)  98.1 F (36.7 C) 98.5 F (36.9 C)  TempSrc: Oral     Resp: 16  15 19   Height:      Weight:  94.5 kg (208 lb 5.4 oz)    SpO2: 98%  95% 99%    Intake/Output Summary (Last 24 hours) at 09/15/15 1743 Last data filed at 09/15/15 0958  Gross per 24 hour  Intake    320 ml  Output      0 ml  Net    320 ml   Filed Weights   09/13/15 2009 09/14/15 0434 09/15/15 0500  Weight: 94.1 kg (207 lb 7.3 oz) 94.1 kg (207 lb 7.3 oz) 94.5 kg (208 lb 5.4 oz)    Examination:  General exam: Appears calm and comfortable, laying in bed Respiratory system: Clear to auscultation. Respiratory effort normal. Cardiovascular system: S1 & S2 heard, RRR. No pedal edema. Gastrointestinal system: Abdomen  is nondistended, soft and nontender. No organomegaly or masses felt. Normal bowel sounds heard. Central nervous system: Alert and oriented. No focal neurological deficits. Extremities: Symmetric 5 x 5 power. Skin: No rashes, lesions or ulcers Psychiatry: Judgement and insight appear normal. Mood & affect appropriate.     Data Reviewed: I have personally reviewed following labs and imaging studies  CBC:  Recent Labs Lab 09/13/15 0954 09/13/15 1745 09/14/15 0402 09/15/15 0621  WBC 10.7* 7.8 7.5 8.0  NEUTROABS 8.1*  --   --   --   HGB 10.5* 10.4* 10.6* 10.1*  HCT 33.4* 34.3* 35.4*  33.2*  MCV 92.8 92.2 92.2 91.2  PLT 282 262 269 [id number]   Basic Metabolic Panel:  Recent Labs Lab 09/13/15 0954 09/13/15 1745 09/14/15 0402 09/15/15 0621  NA 148*  --  148* 147*  K 4.0  --  3.5 3.8  CL 111  --  111 111  CO2 27  --  27 25  GLUCOSE 154*  --  95 135*  BUN 22*  --  14 16  CREATININE 1.47* 1.19* 0.93 1.07*  CALCIUM 9.5  --  9.3 9.3   GFR: Estimated Creatinine Clearance: 49.4 mL/min (by C-G formula based on Cr of 1.07). Liver Function Tests:  Recent Labs Lab 09/13/15 0954 09/14/15 0402  AST 13* 11*  ALT 16 12*  ALKPHOS 60 60  BILITOT 0.7 0.6  PROT 6.1* 6.1*  ALBUMIN 2.9* 2.8*   No results for input(s): LIPASE, AMYLASE in the last 168 hours. No results for input(s): AMMONIA in the last 168 hours. Coagulation Profile:  Recent Labs Lab 09/13/15 0954  INR 1.16   Cardiac Enzymes:  Recent Labs Lab 09/13/15 0954  TROPONINI <0.03   BNP (last 3 results) No results for input(s): PROBNP in the last 8760 hours. HbA1C: No results for input(s): HGBA1C in the last 72 hours. CBG:  Recent Labs Lab 09/14/15 1734 09/14/15 2144 09/15/15 0737 09/15/15 1203 09/15/15 1649  GLUCAP 130* 129* 139* 152* 123*   Lipid Profile: No results for input(s): CHOL, HDL, LDLCALC, TRIG, CHOLHDL, LDLDIRECT in the last 72 hours. Thyroid Function Tests: No results for input(s): TSH, T4TOTAL, FREET4, T3FREE, THYROIDAB in the last 72 hours. Anemia Panel: No results for input(s): VITAMINB12, FOLATE, FERRITIN, TIBC, IRON, RETICCTPCT in the last 72 hours. Urine analysis:    Component Value Date/Time   COLORURINE YELLOW 09/13/2015 1536   APPEARANCEUR CLOUDY* 09/13/2015 1536   LABSPEC 1.014 09/13/2015 1536   PHURINE 7.5 09/13/2015 1536   GLUCOSEU NEGATIVE 09/13/2015 1536   GLUCOSEU NEGATIVE 02/10/2013 1024   HGBUR NEGATIVE 09/13/2015 1536   BILIRUBINUR NEGATIVE 09/13/2015 1536   KETONESUR NEGATIVE 09/13/2015 1536   PROTEINUR 30* 09/13/2015 1536   UROBILINOGEN 1.0  04/05/2014 1220   NITRITE NEGATIVE 09/13/2015 1536   LEUKOCYTESUR LARGE* 09/13/2015 1536   Sepsis Labs: @LABRCNTIP (procalcitonin:4,lacticidven:4)  ) Recent Results (from the past 240 hour(s))  MRSA PCR Screening     Status: None   Collection Time: 09/13/15  8:41 PM  Result Value Ref Range Status   MRSA by PCR NEGATIVE NEGATIVE Final    Comment:        The GeneXpert MRSA Assay (FDA approved for NASAL specimens only), is one component of a comprehensive MRSA colonization surveillance program. It is not intended to diagnose MRSA infection nor to guide or monitor treatment for MRSA infections.   Culture, blood (routine x 2)     Status: None (Preliminary result)   Collection Time: 09/14/15  9:04  AM  Result Value Ref Range Status   Specimen Description BLOOD RIGHT FOREARM  Final   Special Requests IN PEDIATRIC BOTTLE 3CC  Final   Culture  Setup Time   Final    GRAM POSITIVE COCCI IN CLUSTERS IN PEDIATRIC BOTTLE CRITICAL RESULT CALLED TO, READ BACK BY AND VERIFIED WITH: K. BRUMAGIN,RN AT [id number] ON [id number] BY Rhea Bleacher    Culture TOO YOUNG TO READ  Final   Report Status PENDING  Incomplete         Radiology Studies: No results found.      Scheduled Meds: . amLODipine  10 mg Oral Daily  . aspirin EC  81 mg Oral Daily  . atorvastatin  10 mg Oral q1800  . cefTRIAXone (ROCEPHIN)  IV  1 g Intravenous Q24H  . citalopram  10 mg Oral Daily  . cloNIDine  0.1 mg Oral BID  . donepezil  10 mg Oral QHS  . gabapentin  300 mg Oral TID  . heparin  5,000 Units Subcutaneous 3 times per day  . insulin aspart  0-9 Units Subcutaneous TID WC  . isosorbide mononitrate  60 mg Oral QHS  . labetalol  200 mg Oral BID  . vancomycin  750 mg Intravenous Q12H   Continuous Infusions: . sodium chloride 75 mL/hr at 09/13/15 1620     LOS: 1 day    Otilia Kareem, Orpah Melter, MD [facility] Pager [phone]  If 7PM-7AM, please contact night-coverage [email or web address] Password  [facility] 09/15/2015, 5:43 PM

## 2015-09-15 NOTE — Progress Notes (Signed)
Pharmacy Antibiotic Note  Katie Dawson is a 82 y.o. female admitted on 09/13/2015 with bacteremia.  Pharmacy has been consulted for Vancomycin dosing. Afebrile, WBC 8, CrCl 20mL/min  Plan: Vancomycin 750mg  IV Q12h  F/U c/s, renal fxn, LOT, VT @ss  Goal VT 15-20   Height: 5\' 6"  (167.6 cm) Weight: 208 lb 5.4 oz (94.5 kg) IBW/kg (Calculated) : 59.3  Temp (24hrs), Avg:98.6 F (37 C), Min:98.1 F (36.7 C), Max:99.2 F (37.3 C)   Recent Labs Lab 09/13/15 0954 09/13/15 1745 09/14/15 0402 09/15/15 0621  WBC 10.7* 7.8 7.5 8.0  CREATININE 1.47* 1.19* 0.93 1.07*    Estimated Creatinine Clearance: 49.4 mL/min (by C-G formula based on Cr of 1.07).    No Known Allergies  Antimicrobials this admission: 4/14 CTX>> 4/15 Vanc>>  Microbiology results: 4/14 BCx>>1/2 GPC in clusters   Thank you for allowing pharmacy to be a part of this patient's care.  Kaleiah Kutzer C. Lennox Grumbles, PharmD Pharmacy Resident  Pager: [phone] 09/15/2015 8:11 AM

## 2015-09-16 LAB — GLUCOSE, CAPILLARY
Glucose-Capillary: 138 mg/dL — ABNORMAL HIGH (ref 65–99)
Glucose-Capillary: 130 mg/dL — ABNORMAL HIGH (ref 65–99)
Glucose-Capillary: 146 mg/dL — ABNORMAL HIGH (ref 65–99)
Glucose-Capillary: 80 mg/dL (ref 65–99)
Glucose-Capillary: 122 mg/dL — ABNORMAL HIGH (ref 65–99)

## 2015-09-16 LAB — URINE CULTURE: CULTURE: NO GROWTH

## 2015-09-16 NOTE — Progress Notes (Addendum)
PROGRESS NOTE    Katie Dawson  [id number] DOB: 1933/10/19 DOA: 09/13/2015 PCP: Cathlean Cower, MD  Outpatient Specialists:    Brief Narrative:  82 y.o. female with history of diabetes type 2, hyperlipidemia, anemia, hypertension, OSA, dementia, brought by EMS after a syncopal episode  Assessment & Plan:   Principal Problem:   Lethargy Active Problems:   Insulin dependent diabetes mellitus ([place])   HLD (hyperlipidemia)   Sleep apnea   Essential hypertension   Aortic stenosis   Dementia   Dizziness   Syncope, Labs, EKG unrevealing. CT head negative for acute changes. Last 2 D echo in 2010 showed AI with estimated ejectionfraction was in the range of 60% to 65%. LE Dopplers normal. BS 154Received Narcan with some response.  Echocardiogram done, EF 55% with grade 2 diastolic dysfunction Holding narcotic meds as tolerated Urinalysis appears suggestive of UTI with TNTC WBC, large leuks and many bacteria. Urine cx pending Per staff, on I/O cath to obtain urine cx, gross purulence was noted Cont on empiric rocephin for now  Diabetes mellitus type 2 insulin dependent. Current Glucose 154   Recent Labs    Lab Results  Component Value Date   HGBA1C 6.9* 08/31/2015    Holding oral meds - SSI  Peripheral Neuropathy. RLE dopplers negative for DVT RLE pain related to neuropathy.  Continue Neurontin  Anemia of chronic disease Current Hb 10.9. No bleeding issues noted.  No transfusion indicated at this time  Chronic kidney disease stage ,acute on chronic - Minimize nephrotoxins and renally dose medications  Recent Labs    Lab Results  Component Value Date   CREATININE 1.47* 09/13/2015   CREATININE 1.23* 08/31/2015   CREATININE 0.86 04/05/2014     Cr improved since admission, remains stable  Hypertension BP 114/75 mmHg  Pulse 66  Temp(Src) 97.2 F (36.2 C) (Rectal)  Resp 21  Wt 98.884 kg (218 lb)  SpO2 96% Resumed  meds  Depression Continue Celexa  Deconditioning  PT/OT/Nutrition Evaluation  UTI without sepsis - UA is suggestive of UTi - Urine and blood cx pending - for now, continue empiric rocephin      DVT prophylaxis: Heparin subQ Code Status: Full Family Communication: Pt in room, spoke with daughter at bedside Disposition Plan: Possible d/c home in 24-48hrs   Consultants:     Procedures:     Antimicrobials:   Rocephin 4/14>>>    Subjective: No complaints today  Objective: Filed Vitals:   09/15/15 2248 09/16/15 0437 09/16/15 0439 09/16/15 1353  BP: 167/69 162/48  164/68  Pulse: 67 63  68  Temp: 99.3 F (37.4 C) 98.7 F (37.1 C)  98.5 F (36.9 C)  TempSrc:    Oral  Resp: 18 16  18   Height:      Weight:   94.4 kg (208 lb 1.8 oz)   SpO2: 99% 98%  96%    Intake/Output Summary (Last 24 hours) at 09/16/15 1716 Last data filed at 09/16/15 1455  Gross per 24 hour  Intake    370 ml  Output      0 ml  Net    370 ml   Filed Weights   09/14/15 0434 09/15/15 0500 09/16/15 0439  Weight: 94.1 kg (207 lb 7.3 oz) 94.5 kg (208 lb 5.4 oz) 94.4 kg (208 lb 1.8 oz)    Examination:  General exam: Asleep, easily arousable, laying in bed Respiratory system: Clear to auscultation. Respiratory effort normal. Cardiovascular system: S1 & S2 heard, RRR. No pedal  edema. Gastrointestinal system: Abdomen is nondistended, soft and nontender. No organomegaly or masses felt. Normal bowel sounds heard. Central nervous system: Alert and oriented. No focal neurological deficits. Extremities: Symmetric 5 x 5 power. Skin: No rashes, lesions or ulcers Psychiatry: Judgement and insight appear normal. Mood & affect appropriate.     Data Reviewed: I have personally reviewed following labs and imaging studies  CBC:  Recent Labs Lab 09/13/15 0954 09/13/15 1745 09/14/15 0402 09/15/15 0621  WBC 10.7* 7.8 7.5 8.0  NEUTROABS 8.1*  --   --   --   HGB 10.5* 10.4* 10.6* 10.1*  HCT  33.4* 34.3* 35.4* 33.2*  MCV 92.8 92.2 92.2 91.2  PLT 282 262 269 [id number]   Basic Metabolic Panel:  Recent Labs Lab 09/13/15 0954 09/13/15 1745 09/14/15 0402 09/15/15 0621  NA 148*  --  148* 147*  K 4.0  --  3.5 3.8  CL 111  --  111 111  CO2 27  --  27 25  GLUCOSE 154*  --  95 135*  BUN 22*  --  14 16  CREATININE 1.47* 1.19* 0.93 1.07*  CALCIUM 9.5  --  9.3 9.3   GFR: Estimated Creatinine Clearance: 49.3 mL/min (by C-G formula based on Cr of 1.07). Liver Function Tests:  Recent Labs Lab 09/13/15 0954 09/14/15 0402  AST 13* 11*  ALT 16 12*  ALKPHOS 60 60  BILITOT 0.7 0.6  PROT 6.1* 6.1*  ALBUMIN 2.9* 2.8*   No results for input(s): LIPASE, AMYLASE in the last 168 hours. No results for input(s): AMMONIA in the last 168 hours. Coagulation Profile:  Recent Labs Lab 09/13/15 0954  INR 1.16   Cardiac Enzymes:  Recent Labs Lab 09/13/15 0954  TROPONINI <0.03   BNP (last 3 results) No results for input(s): PROBNP in the last 8760 hours. HbA1C: No results for input(s): HGBA1C in the last 72 hours. CBG:  Recent Labs Lab 09/15/15 2147 09/16/15 0702 09/16/15 0738 09/16/15 1154 09/16/15 1645  GLUCAP 160* 138* 130* 146* 80   Lipid Profile: No results for input(s): CHOL, HDL, LDLCALC, TRIG, CHOLHDL, LDLDIRECT in the last 72 hours. Thyroid Function Tests: No results for input(s): TSH, T4TOTAL, FREET4, T3FREE, THYROIDAB in the last 72 hours. Anemia Panel: No results for input(s): VITAMINB12, FOLATE, FERRITIN, TIBC, IRON, RETICCTPCT in the last 72 hours. Urine analysis:    Component Value Date/Time   COLORURINE YELLOW 09/13/2015 1536   APPEARANCEUR CLOUDY* 09/13/2015 1536   LABSPEC 1.014 09/13/2015 1536   PHURINE 7.5 09/13/2015 1536   GLUCOSEU NEGATIVE 09/13/2015 1536   GLUCOSEU NEGATIVE 02/10/2013 1024   HGBUR NEGATIVE 09/13/2015 1536   BILIRUBINUR NEGATIVE 09/13/2015 1536   KETONESUR NEGATIVE 09/13/2015 1536   PROTEINUR 30* 09/13/2015 1536    UROBILINOGEN 1.0 04/05/2014 1220   NITRITE NEGATIVE 09/13/2015 1536   LEUKOCYTESUR LARGE* 09/13/2015 1536   Sepsis Labs: @LABRCNTIP (procalcitonin:4,lacticidven:4)  ) Recent Results (from the past 240 hour(s))  MRSA PCR Screening     Status: None   Collection Time: 09/13/15  8:41 PM  Result Value Ref Range Status   MRSA by PCR NEGATIVE NEGATIVE Final    Comment:        The GeneXpert MRSA Assay (FDA approved for NASAL specimens only), is one component of a comprehensive MRSA colonization surveillance program. It is not intended to diagnose MRSA infection nor to guide or monitor treatment for MRSA infections.   Culture, blood (routine x 2)     Status: None (Preliminary result)   Collection  Time: 09/14/15  9:04 AM  Result Value Ref Range Status   Specimen Description BLOOD RIGHT FOREARM  Final   Special Requests IN PEDIATRIC BOTTLE 3CC  Final   Culture  Setup Time   Final    GRAM POSITIVE COCCI IN CLUSTERS IN PEDIATRIC BOTTLE CRITICAL RESULT CALLED TO, READ BACK BY AND VERIFIED WITH: Rickey Primus AT [id number] ON [id number] BY Rhea Bleacher    Culture GRAM POSITIVE COCCI  Final   Report Status PENDING  Incomplete  Culture, blood (routine x 2)     Status: None (Preliminary result)   Collection Time: 09/14/15  9:18 AM  Result Value Ref Range Status   Specimen Description BLOOD RIGHT ANTECUBITAL  Final   Special Requests IN PEDIATRIC BOTTLE 3CC  Final   Culture NO GROWTH 1 DAY  Final   Report Status PENDING  Incomplete  Culture, Urine     Status: None   Collection Time: 09/15/15 12:15 PM  Result Value Ref Range Status   Specimen Description URINE, CATHETERIZED  Final   Special Requests NONE  Final   Culture NO GROWTH 1 DAY  Final   Report Status 09/16/2015 FINAL  Final         Radiology Studies: No results found.      Scheduled Meds: . amLODipine  10 mg Oral Daily  . aspirin EC  81 mg Oral Daily  . atorvastatin  10 mg Oral q1800  . cefTRIAXone (ROCEPHIN)  IV  1 g  Intravenous Q24H  . citalopram  10 mg Oral Daily  . cloNIDine  0.1 mg Oral BID  . donepezil  10 mg Oral QHS  . gabapentin  300 mg Oral TID  . heparin  5,000 Units Subcutaneous 3 times per day  . insulin aspart  0-9 Units Subcutaneous TID WC  . isosorbide mononitrate  60 mg Oral QHS  . labetalol  200 mg Oral BID  . vancomycin  750 mg Intravenous Q12H   Continuous Infusions: . sodium chloride 75 mL/hr at 09/16/15 0057     LOS: 2 days    Loranda Mastel, Orpah Melter, MD [facility] Pager [phone]  If 7PM-7AM, please contact night-coverage [email or web address] Password [employer] 09/16/2015, 5:16 PM

## 2015-09-17 LAB — CULTURE, BLOOD (ROUTINE X 2)

## 2015-09-17 LAB — GLUCOSE, CAPILLARY
GLUCOSE-CAPILLARY: 128 mg/dL — AB (ref 65–99)
GLUCOSE-CAPILLARY: 134 mg/dL — AB (ref 65–99)
GLUCOSE-CAPILLARY: 177 mg/dL — AB (ref 65–99)
Glucose-Capillary: 108 mg/dL — ABNORMAL HIGH (ref 65–99)

## 2015-09-17 MED ORDER — CEFUROXIME AXETIL 250 MG PO TABS: 250.0000 mg | ORAL_TABLET | Freq: Two times a day (BID) | ORAL | Status: DC

## 2015-09-17 NOTE — Progress Notes (Signed)
Physical Therapy Treatment Patient Details Name: Katie Dawson MRN: [id number] DOB: 12-28-1933 Today's Date: September 27, 2015    History of Present Illness Pt adm after syncopal episode at [facility]. Pt initially lethargic. Given Narcan in route with improvement. PMH - DM, neuropathy, leg pain, HTN, dementia    PT Comments    Pt making slow progress.  Follow Up Recommendations  Home health PT (at ALF)     Equipment Recommendations  None recommended by PT    Recommendations for Other Services       Precautions / Restrictions Precautions Precautions: Fall Restrictions Weight Bearing Restrictions: No    Mobility  Bed Mobility Overal bed mobility: Needs Assistance Bed Mobility: Supine to Sit;Sit to Supine;Rolling Rolling: Max assist   Supine to sit: Mod assist Sit to supine: Max assist   General bed mobility comments: Assist to bring legs over and elevate trunk into sitting.  Transfers Overall transfer level: Needs assistance Equipment used: Ambulation equipment used Transfers: Sit to/from Stand Sit to Stand: +2 physical assistance;Mod assist         General transfer comment: Assist to bring hips and trunk up.  Ambulation/Gait                 Stairs            Wheelchair Mobility    Modified Rankin (Stroke Patients Only)       Balance Overall balance assessment: Needs assistance Sitting-balance support: No upper extremity supported;Feet supported Sitting balance-Leahy Scale: Fair Sitting balance - Comments: sits without UE support   Standing balance support: Bilateral upper extremity supported Standing balance-Leahy Scale: Zero Standing balance comment: Pt stood with Stedy x 2 for 60-90 secs with mod to max A to maintain                    Cognition Arousal/Alertness: Awake/alert Behavior During Therapy: Flat affect Overall Cognitive Status: History of cognitive impairments - at baseline   Orientation Level:  Disoriented to;Place;Time;Situation Current Attention Level: Sustained Memory: Decreased short-term memory Following Commands: Follows one step commands consistently     Problem Solving: Slow processing;Decreased initiation;Requires verbal cues;Requires tactile cues      Exercises      General Comments        Pertinent Vitals/Pain Pain Assessment: Faces Faces Pain Scale: Hurts little more Pain Location: Rt leg Pain Descriptors / Indicators: Grimacing Pain Intervention(s): Limited activity within patient's tolerance;Monitored during session;Patient requesting pain meds-RN notified    Home Living                      Prior Function            PT Goals (current goals can now be found in the care plan section) Progress towards PT goals: Progressing toward goals    Frequency  Min 3X/week    PT Plan Current plan remains appropriate    Co-evaluation             End of Session Equipment Utilized During Treatment: Gait belt Charlaine Dalton) Activity Tolerance: Patient limited by fatigue;Patient limited by pain Patient left: in bed;with call bell/phone within reach;with bed alarm set     Time: [id number] PT Time Calculation (min) (ACUTE ONLY): 20 min  Charges:  $Therapeutic Activity: 8-22 mins                    G Codes:      Israa Caban 09/27/15, 9:57 AM Suanne Marker PT  [phone]   

## 2015-09-17 NOTE — Discharge Summary (Addendum)
Physician Discharge Summary  Katie Dawson [id number] DOB: [birth year] DOA: 09/13/2015  PCP: Cathlean Cower, MD  Admit date: 09/13/2015 Discharge date: 09/17/2015  Time spent: 20 minutes  Recommendations for Outpatient Follow-up:  1. Follow up with PCP in 2-3 weeks   Discharge Diagnoses:  Principal Problem:   Lethargy Active Problems:   Insulin dependent diabetes mellitus (HCC)   HLD (hyperlipidemia)   Sleep apnea   Essential hypertension   Aortic stenosis   Dementia   Dizziness   Discharge Condition: Improved  Diet recommendation: Diabetic  Filed Weights   09/15/15 0500 09/16/15 0439 09/17/15 0500  Weight: 94.5 kg (208 lb 5.4 oz) 94.4 kg (208 lb 1.8 oz) 95.5 kg (210 lb 8.6 oz)    History of present illness:  Please review dictated H and P from 4/13 for details. Briefly, 82 y.o. female with history of diabetes type 2, hyperlipidemia, anemia, hypertension, OSA, dementia, brought by EMS after a syncopal episode, admitted for concerns for florid UTI.  Hospital Course:  Syncope, Labs, EKG unrevealing. CT head negative for acute changes. Last 2 D echo in 2010 showed AI with estimated ejectionfraction was in the range of 60% to 65%. LE Dopplers normal. BS 154Received Narcan with some response.  Echocardiogram done, EF 55% with grade 2 diastolic dysfunction Held narcotic meds as tolerated this admit Urinalysis appears suggestive of UTI with TNTC WBC, large leuks and many bacteria. Urine cx pending Per staff, on I/O cath to obtain urine cx, gross purulence was noted Continued on empiric rocephin. Urine cx was unremarkable, albeit was obtained after abx were started Patient will complete ceftin x 4 more days after discharge  Diabetes mellitus type 2 insulin dependent. Current Glucose 154   Recent Labs    Lab Results  Component Value Date   HGBA1C 6.9* 08/31/2015    Holding oral meds - SSI - Patient noted per our med rec noted to be on 60 units of  lantus at home. - Per report, pt is supposed to be on 16 units daily - recommend continue 16 units of lantus nightly  Peripheral Neuropathy. RLE dopplers negative for DVT RLE pain related to neuropathy.  Continued Neurontin  Anemia of chronic disease Current Hb 10.9. No bleeding issues noted.  No transfusion indicated at this time  Chronic kidney disease stage ,acute on chronic - Minimize nephrotoxins and renally dose medications  Recent Labs    Lab Results  Component Value Date   CREATININE 1.47* 09/13/2015   CREATININE 1.23* 08/31/2015   CREATININE 0.86 04/05/2014     Cr improved since admission, remains stable  Hypertension BP 114/75 mmHg  Pulse 66  Temp(Src) 97.2 F (36.2 C) (Rectal)  Resp 21  Wt 98.884 kg (218 lb)  SpO2 96% Resumed meds  Depression Continue Celexa  Deconditioning  PT/OT/Nutrition Evaluation  UTI without sepsis - UA was suggestive of UTi - Improved with rocephin - Urine cx was unrevealing but was obtained after starting abx - Blood cx revealed 1/2 coag neg staph, likely contaminant          Discharge Exam: Filed Vitals:   09/17/15 0338 09/17/15 0500 09/17/15 0610 09/17/15 1453  BP: 155/52  163/68 150/49  Pulse: 73  71 64  Temp: 99.1 F (37.3 C)  99 F (37.2 C) 98.5 F (36.9 C)  TempSrc: Oral   Oral  Resp: 16  16 20   Height:      Weight:  95.5 kg (210 lb 8.6 oz)    SpO2: 98%  95% 97%    General: Awake, in nad Cardiovascular: regular, s1, s2 Respiratory: normal resp effort, no wheezing  Discharge Instructions     Medication List    TAKE these medications        amLODipine 10 MG tablet  Commonly known as:  NORVASC  Take 10 mg by mouth daily.     aspirin EC 81 MG tablet  Take 1 tablet (81 mg total) by mouth daily.     atorvastatin 10 MG tablet  Commonly known as:  LIPITOR  Take 10 mg by mouth daily.     BD PEN NEEDLE NANO U/F 32G X 4 MM Misc  Generic drug:  Insulin Pen  Needle  1 Device by Does not apply route daily as needed (insulin).     BD PEN NEEDLE NANO U/F 32G X 4 MM Misc  Generic drug:  Insulin Pen Needle  USE WITH LANTUS DAILY.     busPIRone 7.5 MG tablet  Commonly known as:  BUSPAR  Take 7.5 mg by mouth 3 (three) times daily.     cefUROXime 250 MG tablet  Commonly known as:  CEFTIN  Take 1 tablet (250 mg total) by mouth 2 (two) times daily with a meal.     citalopram 10 MG tablet  Commonly known as:  CELEXA  Take 10 mg by mouth daily.     cloNIDine 0.1 MG tablet  Commonly known as:  CATAPRES  Take 0.1 mg by mouth 2 (two) times daily.     donepezil 10 MG tablet  Commonly known as:  ARICEPT  Take 1 tablet (10 mg total) by mouth at bedtime.     gabapentin 300 MG capsule  Commonly known as:  NEURONTIN  Take 1 capsule (300 mg total) by mouth 3 (three) times daily.     glipiZIDE 5 MG 24 hr tablet  Commonly known as:  GLUCOTROL XL  Take 1 tablet (5 mg total) by mouth daily with breakfast.     glucose blood test strip  Commonly known as:  BAYER CONTOUR NEXT TEST  Use as instructed     hydrALAZINE 25 MG tablet  Commonly known as:  APRESOLINE  Take 1 tablet (25 mg total) by mouth every 8 (eight) hours.     insulin glargine 100 UNIT/ML injection  Commonly known as:  LANTUS  Inject 0.6 mLs (60 Units total) into the skin at bedtime.     insulin lispro 100 UNIT/ML injection  Commonly known as:  HUMALOG  Inject 2 Units into the skin 3 (three) times daily before meals. Hold if pt not eating or sugar below 100     isosorbide mononitrate 60 MG 24 hr tablet  Commonly known as:  IMDUR  Take 60 mg by mouth at bedtime.     labetalol 200 MG tablet  Commonly known as:  NORMODYNE  Take 200 mg by mouth 2 (two) times daily.     Lancets Misc  Use as directed 1 per day     risperiDONE 0.5 MG tablet  Commonly known as:  RISPERDAL  Take 0.5 mg by mouth at bedtime.     traMADol 50 MG tablet  Commonly known as:  ULTRAM  Take 1 tablet (50  mg total) by mouth every 6 (six) hours as needed.       No Known Allergies Follow-up Information    Follow up with Cathlean Cower, MD. Schedule an appointment as soon as possible for a visit in 2 weeks.   Specialties:  Internal Medicine, Radiology   Why:  Hospital follow up   Contact information:   [place] [address] [phone]        The results of significant diagnostics from this hospitalization (including imaging, microbiology, ancillary and laboratory) are listed below for reference.    Significant Diagnostic Studies: X-ray Chest Pa And Lateral  09/13/2015  CLINICAL DATA:  Syncope.  Altered mental status for 1 day. EXAM: CHEST  2 VIEW COMPARISON:  04/05/2014 FINDINGS: The cardiac silhouette remains mildly enlarged. Thoracic aortic atherosclerosis is noted. Bilateral lung nodules are again seen. The largest is in the left upper lobe and measures approximately 2.3 cm, possibly slightly enlarged from the prior radiograph. There is mild central airway thickening. No segmental airspace consolidation, edema, pleural effusion, or pneumothorax is identified. Surgical clips are noted in the lower neck. No acute osseous abnormality is identified. IMPRESSION: 1. Mild bronchitic changes.  No evidence of pneumonia. 2. Bilateral lung nodules. The apical left upper lobe nodule may have slightly increased in size. Electronically Signed   By: Logan Bores M.D.   On: 09/13/2015 16:34   Ct Head Wo Contrast  09/13/2015  CLINICAL DATA:  Altered mental status this morning.  Fall. EXAM: CT HEAD WITHOUT CONTRAST TECHNIQUE: Contiguous axial images were obtained from the base of the skull through the vertex without intravenous contrast. COMPARISON:  Head MRI and CT 04/05/2014 FINDINGS: There is no evidence of acute cortical infarct, intracranial hemorrhage, mass, midline shift, or extra-axial fluid collection. Mild generalized cerebral atrophy is unchanged. Hypodensities throughout the  cerebral white matter do not appear significantly changed and are nonspecific but compatible with extensive chronic small vessel ischemic disease. Orbits are unremarkable. Calcified atherosclerosis is noted at the skullbase. There is a trace right mastoid effusion. The visualized paranasal sinuses are clear. No skull fracture is identified. IMPRESSION: 1. No evidence of acute intracranial abnormality. 2. Extensive chronic small vessel ischemic disease. Electronically Signed   By: Logan Bores M.D.   On: 09/13/2015 10:31    Microbiology: Recent Results (from the past 240 hour(s))  MRSA PCR Screening     Status: None   Collection Time: 09/13/15  8:41 PM  Result Value Ref Range Status   MRSA by PCR NEGATIVE NEGATIVE Final    Comment:        The GeneXpert MRSA Assay (FDA approved for NASAL specimens only), is one component of a comprehensive MRSA colonization surveillance program. It is not intended to diagnose MRSA infection nor to guide or monitor treatment for MRSA infections.   Culture, blood (routine x 2)     Status: Abnormal   Collection Time: 09/14/15  9:04 AM  Result Value Ref Range Status   Specimen Description BLOOD RIGHT FOREARM  Final   Special Requests IN PEDIATRIC BOTTLE 3CC  Final   Culture  Setup Time   Final    GRAM POSITIVE COCCI IN CLUSTERS IN PEDIATRIC BOTTLE CRITICAL RESULT CALLED TO, READ BACK BY AND VERIFIED WITH: K. BRUMAGIN,RN AT [id number] ON [id number] BY Rhea Bleacher    Culture (A)  Final    STAPHYLOCOCCUS SPECIES (COAGULASE NEGATIVE) THE SIGNIFICANCE OF ISOLATING THIS ORGANISM FROM A SINGLE SET OF BLOOD CULTURES WHEN MULTIPLE SETS ARE DRAWN IS UNCERTAIN. PLEASE NOTIFY THE MICROBIOLOGY DEPARTMENT WITHIN ONE WEEK IF SPECIATION AND SENSITIVITIES ARE REQUIRED.    Report Status 09/17/2015 FINAL  Final  Culture, blood (routine x 2)     Status: None (Preliminary result)   Collection Time: 09/14/15  9:18 AM  Result Value Ref Range Status   Specimen Description BLOOD RIGHT  ANTECUBITAL  Final   Special Requests IN PEDIATRIC BOTTLE 3CC  Final   Culture NO GROWTH 2 DAYS  Final   Report Status PENDING  Incomplete  Culture, Urine     Status: None   Collection Time: 09/15/15 12:15 PM  Result Value Ref Range Status   Specimen Description URINE, CATHETERIZED  Final   Special Requests NONE  Final   Culture NO GROWTH 1 DAY  Final   Report Status 09/16/2015 FINAL  Final     Labs: Basic Metabolic Panel:  Recent Labs Lab 09/13/15 0954 09/13/15 1745 09/14/15 0402 09/15/15 0621  NA 148*  --  148* 147*  K 4.0  --  3.5 3.8  CL 111  --  111 111  CO2 27  --  27 25  GLUCOSE 154*  --  95 135*  BUN 22*  --  14 16  CREATININE 1.47* 1.19* 0.93 1.07*  CALCIUM 9.5  --  9.3 9.3   Liver Function Tests:  Recent Labs Lab 09/13/15 0954 09/14/15 0402  AST 13* 11*  ALT 16 12*  ALKPHOS 60 60  BILITOT 0.7 0.6  PROT 6.1* 6.1*  ALBUMIN 2.9* 2.8*   No results for input(s): LIPASE, AMYLASE in the last 168 hours. No results for input(s): AMMONIA in the last 168 hours. CBC:  Recent Labs Lab 09/13/15 0954 09/13/15 1745 09/14/15 0402 09/15/15 0621  WBC 10.7* 7.8 7.5 8.0  NEUTROABS 8.1*  --   --   --   HGB 10.5* 10.4* 10.6* 10.1*  HCT 33.4* 34.3* 35.4* 33.2*  MCV 92.8 92.2 92.2 91.2  PLT 282 262 269 264   Cardiac Enzymes:  Recent Labs Lab 09/13/15 0954  TROPONINI <0.03   BNP: BNP (last 3 results) No results for input(s): BNP in the last 8760 hours.  ProBNP (last 3 results) No results for input(s): PROBNP in the last 8760 hours.  CBG:  Recent Labs Lab 09/16/15 1645 09/16/15 2158 09/17/15 0603 09/17/15 0736 09/17/15 1150  GLUCAP 80 122* 128* 134* 177*    Signed:  Milina Pagett K  [facility] 09/17/2015, 3:14 PM

## 2015-09-17 NOTE — NC FL2 (Signed)
[place] LEVEL OF CARE SCREENING TOOL     IDENTIFICATION  Patient Name: Katie Dawson Birthdate: March 14, 1934 Sex: female Admission Date (Current Location): 09/13/2015  [facility] and [place] Number:  Herbalist and Address:  [facility], [place] [address], [place], [place] [address]      Provider Number: [id number]  Attending Physician Name and Address:  Donne Hazel, MD  Relative Name and Phone Number:  [name], daughter, [phone]    Current Level of Care: Hospital Recommended Level of Care: [place] Prior Approval Number:    Date Approved/Denied:   PASRR Number:    Discharge Plan: Other (Comment) (ALF)    Current Diagnoses: Patient Active Problem List   Diagnosis Date Noted  . Lethargy 09/13/2015  . Dizziness 04/05/2014  . Abnormal urine odor 02/07/2014  . UTI (urinary tract infection) 10/17/2013  . Hypoglycemia 10/16/2013  . Encephalopathy acute 10/16/2013  . Heart murmur 09/16/2013  . Agitation 09/16/2013  . Back pain 05/18/2013  . Altered mental state 05/15/2013  . Lower extremity weakness 05/15/2013  . ARF (acute renal failure) ([place]) 05/15/2013  . Leucocytosis 05/15/2013  . Peripheral neuropathy ([place]) 05/15/2013  . Aortic stenosis 02/10/2013  . Dementia 02/10/2013  . Vitamin D deficiency 04/10/2012  . Depression 04/10/2012  . Confusion 04/08/2012  . Closed fibular fracture 12/24/2011  . Wrist fracture, closed 12/24/2011  . Fatigue 11/05/2010  . Preventative health care 11/03/2010  . INTERMITTENT VERTIGO 04/20/2009  . OSTEOPENIA 02/12/2009  . FOOT PAIN, LEFT 12/23/2007  . Insulin dependent diabetes mellitus ([place]) 04/22/2007  . HLD (hyperlipidemia) 04/22/2007  . GOUT 04/22/2007  . ANEMIA-IRON DEFICIENCY 04/22/2007  . ANXIETY 04/22/2007  . Sleep apnea 04/22/2007  . Essential hypertension 04/22/2007  . GERD 04/22/2007  . RENAL INSUFFICIENCY 04/22/2007  . OSTEOARTHRITIS, KNEES,  BILATERAL 04/22/2007  . COLONIC POLYPS, HX OF 04/22/2007  . NEPHRECTOMY, HX OF 04/22/2007    Orientation RESPIRATION BLADDER Height & Weight     Self, Place  Normal Incontinent Weight: 210 lb 8.6 oz (95.5 kg) Height:  5\' 6"  (167.6 cm)  BEHAVIORAL SYMPTOMS/MOOD NEUROLOGICAL BOWEL NUTRITION STATUS      Continent Diet (Regular-Carb modified)  AMBULATORY STATUS COMMUNICATION OF NEEDS Skin   Extensive Assist Verbally                      Personal Care Assistance Level of Assistance  Bathing, Feeding, Dressing Bathing Assistance: Maximum assistance Feeding assistance: Limited assistance Dressing Assistance: Limited assistance     Functional Limitations Info             SPECIAL CARE FACTORS FREQUENCY  OT (By licensed OT)     PT Frequency: 3/wk with home health OT Frequency: 3/wk with home health            Contractures      Additional Factors Info  Code Status, Allergies, Insulin Sliding Scale Code Status Info: Full Allergies Info: NKA   Insulin Sliding Scale Info: insulin aspart (novoLOG) injection 0-9 Units         Discharge Medications: Medication List    TAKE these medications       amLODipine 10 MG tablet  Commonly known as: NORVASC  Take 10 mg by mouth daily.     aspirin EC 81 MG tablet  Take 1 tablet (81 mg total) by mouth daily.     atorvastatin 10 MG tablet  Commonly known as: LIPITOR  Take 10 mg  by mouth daily.     BD PEN NEEDLE NANO U/F 32G X 4 MM Misc  Generic drug: Insulin Pen Needle  1 Device by Does not apply route daily as needed (insulin).     BD PEN NEEDLE NANO U/F 32G X 4 MM Misc  Generic drug: Insulin Pen Needle  USE WITH LANTUS DAILY.     busPIRone 7.5 MG tablet  Commonly known as: BUSPAR  Take 7.5 mg by mouth 3 (three) times daily.     cefUROXime 250 MG tablet  Commonly known as: CEFTIN  Take 1 tablet (250 mg total) by mouth 2 (two) times daily with a meal.      citalopram 10 MG tablet  Commonly known as: CELEXA  Take 10 mg by mouth daily.     cloNIDine 0.1 MG tablet  Commonly known as: CATAPRES  Take 0.1 mg by mouth 2 (two) times daily.     donepezil 10 MG tablet  Commonly known as: ARICEPT  Take 1 tablet (10 mg total) by mouth at bedtime.     gabapentin 300 MG capsule  Commonly known as: NEURONTIN  Take 1 capsule (300 mg total) by mouth 3 (three) times daily.     glipiZIDE 5 MG 24 hr tablet  Commonly known as: GLUCOTROL XL  Take 1 tablet (5 mg total) by mouth daily with breakfast.     glucose blood test strip  Commonly known as: BAYER CONTOUR NEXT TEST  Use as instructed     hydrALAZINE 25 MG tablet  Commonly known as: APRESOLINE  Take 1 tablet (25 mg total) by mouth every 8 (eight) hours.     insulin glargine 100 UNIT/ML injection  Commonly known as: LANTUS  Inject 0.6 mLs (60 Units total) into the skin at bedtime.     insulin lispro 100 UNIT/ML injection  Commonly known as: HUMALOG  Inject 2 Units into the skin 3 (three) times daily before meals. Hold if pt not eating or sugar below 100     isosorbide mononitrate 60 MG 24 hr tablet  Commonly known as: IMDUR  Take 60 mg by mouth at bedtime.     labetalol 200 MG tablet  Commonly known as: NORMODYNE  Take 200 mg by mouth 2 (two) times daily.     Lancets Misc  Use as directed 1 per day     risperiDONE 0.5 MG tablet  Commonly known as: RISPERDAL  Take 0.5 mg by mouth at bedtime.     traMADol 50 MG tablet  Commonly known as: ULTRAM  Take 1 tablet (50 mg total) by mouth every 6 (six) hours as needed.           Relevant Imaging Results:  Relevant Lab Results:   Additional Information SSN: [id number], [place], [place]

## 2015-09-17 NOTE — Progress Notes (Signed)
Patient will discharge to [facility] Anticipated discharge date:4/17 Family notified: pt daughter Transportation by Corey Harold- called at 3:45pm  CSW signing off.  Domenica Reamer, [place] Social Worker [phone]

## 2015-09-17 NOTE — Care Management Important Message (Signed)
Important Message  Patient Details  Name: JAHNICE PRINE MRN: [id number] Date of Birth: 1933-09-14   Medicare Important Message Given:  Yes    Adonai Helzer P Shalissa Easterwood 09/17/2015, 2:01 PM

## 2015-09-17 NOTE — Progress Notes (Signed)
Nsg Discharge Note  Admit Date:  09/13/2015 Discharge date: 09/17/2015   Katie Dawson to be D/C'd to [facility] per MD order.  AVS completed.  Copy for chart, and copy for patient signed, and dated. Patient/caregiver able to verbalize understanding.  Discharge Medication:   Medication List    TAKE these medications        amLODipine 10 MG tablet  Commonly known as:  NORVASC  Take 10 mg by mouth daily.     aspirin EC 81 MG tablet  Take 1 tablet (81 mg total) by mouth daily.     atorvastatin 10 MG tablet  Commonly known as:  LIPITOR  Take 10 mg by mouth daily.     BD PEN NEEDLE NANO U/F 32G X 4 MM Misc  Generic drug:  Insulin Pen Needle  1 Device by Does not apply route daily as needed (insulin).     BD PEN NEEDLE NANO U/F 32G X 4 MM Misc  Generic drug:  Insulin Pen Needle  USE WITH LANTUS DAILY.     busPIRone 7.5 MG tablet  Commonly known as:  BUSPAR  Take 7.5 mg by mouth 3 (three) times daily.     cefUROXime 250 MG tablet  Commonly known as:  CEFTIN  Take 1 tablet (250 mg total) by mouth 2 (two) times daily with a meal.     citalopram 10 MG tablet  Commonly known as:  CELEXA  Take 10 mg by mouth daily.     cloNIDine 0.1 MG tablet  Commonly known as:  CATAPRES  Take 0.1 mg by mouth 2 (two) times daily.     donepezil 10 MG tablet  Commonly known as:  ARICEPT  Take 1 tablet (10 mg total) by mouth at bedtime.     gabapentin 300 MG capsule  Commonly known as:  NEURONTIN  Take 1 capsule (300 mg total) by mouth 3 (three) times daily.     glipiZIDE 5 MG 24 hr tablet  Commonly known as:  GLUCOTROL XL  Take 1 tablet (5 mg total) by mouth daily with breakfast.     glucose blood test strip  Commonly known as:  BAYER CONTOUR NEXT TEST  Use as instructed     hydrALAZINE 25 MG tablet  Commonly known as:  APRESOLINE  Take 1 tablet (25 mg total) by mouth every 8 (eight) hours.     insulin glargine 100 UNIT/ML injection  Commonly known as:  LANTUS   Inject 0.6 mLs (60 Units total) into the skin at bedtime.     insulin lispro 100 UNIT/ML injection  Commonly known as:  HUMALOG  Inject 2 Units into the skin 3 (three) times daily before meals. Hold if pt not eating or sugar below 100     isosorbide mononitrate 60 MG 24 hr tablet  Commonly known as:  IMDUR  Take 60 mg by mouth at bedtime.     labetalol 200 MG tablet  Commonly known as:  NORMODYNE  Take 200 mg by mouth 2 (two) times daily.     Lancets Misc  Use as directed 1 per day     risperiDONE 0.5 MG tablet  Commonly known as:  RISPERDAL  Take 0.5 mg by mouth at bedtime.     traMADol 50 MG tablet  Commonly known as:  ULTRAM  Take 1 tablet (50 mg total) by mouth every 6 (six) hours as needed.        Discharge Assessment: Filed Vitals:  09/17/15 0610 09/17/15 1453  BP: 163/68 150/49  Pulse: 71 64  Temp: 99 F (37.2 C) 98.5 F (36.9 C)  Resp: 16 20   Skin clean, dry and intact without evidence of skin break down, no evidence of skin tears noted. IV catheter discontinued intact. Site without signs and symptoms of complications - no redness or edema noted at insertion site, patient denies c/o pain - only slight tenderness at site.  Dressing with slight pressure applied.  D/c Instructions-Education: Discharge instructions given to patient/family with verbalized understanding. D/c education completed with patient/family including follow up instructions, medication list, d/c activities limitations if indicated, with other d/c instructions as indicated by MD - patient able to verbalize understanding, all questions fully answered. Patient instructed to return to ED, call 911, or call MD for any changes in condition.  Patient escorted via EMS, report called [name].   [name], RN 09/17/2015 5:33 PM

## 2015-09-19 ENCOUNTER — Telehealth: Payer: Self-pay | Admitting: *Deleted

## 2015-09-19 LAB — CULTURE, BLOOD (ROUTINE X 2): CULTURE: NO GROWTH

## 2015-09-19 NOTE — Telephone Encounter (Signed)
Transition Care Management Follow-up Telephone Call   Date discharged? 09/17/15   How have you been since you were released from the hospital? Pt states she is doing ok   Do you understand why you were in the hospital? YES   Do you understand the discharge instructions? YES   Where were you discharged to? Home   Items Reviewed:  Medications reviewed: YES  Allergies reviewed: YES  Dietary changes reviewed: YES, Diabetic  Referrals reviewed: No referral needed   Functional Questionnaire:   Activities of Daily Living (ADLs):   She states she are independent in the following: ambulation, bathing and hygiene, feeding, continence, grooming, toileting and dressing States she require assistance with the following: ambulation sometimes   Any transportation issues/concerns?: NO   Any patient concerns? NO   Confirmed importance and date/time of follow-up visits scheduled YES, appt 10/02/15  Provider Appointment booked with Dr. Cathlean Cower  Confirmed with patient if condition begins to worsen call PCP or go to the ER.  Patient was given the office number and encouraged to call back with question or concerns.  : YES

## 2015-09-20 ENCOUNTER — Telehealth: Payer: Self-pay | Admitting: Internal Medicine

## 2015-09-20 NOTE — Telephone Encounter (Signed)
Left message on my VM stating they received a script for lantus 60 units.  Wanted to verify units because last script was 16 units.

## 2015-09-21 ENCOUNTER — Telehealth: Payer: Self-pay

## 2015-09-21 NOTE — Telephone Encounter (Signed)
Need verity order for PTO 2times a week for 4week PTO 1 time a week 1week. To start next week on 4/24 Please follow up

## 2015-09-25 ENCOUNTER — Telehealth: Payer: Self-pay | Admitting: *Deleted

## 2015-09-25 NOTE — Telephone Encounter (Signed)
Notified Leah w/MD response...Katie Dawson

## 2015-09-25 NOTE — Telephone Encounter (Signed)
Left msg on triage requesting verbal order for PT frequency 1 week 1, 2 week 4, then 1 week 1 to work on mobility, transferring, and balance...Katie Dawson

## 2015-09-25 NOTE — Telephone Encounter (Signed)
Ok for verbal 

## 2015-09-26 ENCOUNTER — Telehealth: Payer: Self-pay | Admitting: Internal Medicine

## 2015-09-26 NOTE — Telephone Encounter (Signed)
Is requesting verbal order for OT to continue.  Left two VM on phone.  Notified to not leave VM.

## 2015-10-02 ENCOUNTER — Other Ambulatory Visit: Payer: Self-pay

## 2015-10-02 ENCOUNTER — Ambulatory Visit (INDEPENDENT_AMBULATORY_CARE_PROVIDER_SITE_OTHER): Payer: Medicare Other | Admitting: Internal Medicine

## 2015-10-02 VITALS — BP 140/78 | HR 55 | Temp 98.3°F | Resp 20 | Wt 201.0 lb

## 2015-10-02 DIAGNOSIS — Z5189 Encounter for other specified aftercare: Secondary | ICD-10-CM | POA: Diagnosis not present

## 2015-10-02 DIAGNOSIS — N3000 Acute cystitis without hematuria: Secondary | ICD-10-CM | POA: Diagnosis not present

## 2015-10-02 DIAGNOSIS — R55 Syncope and collapse: Secondary | ICD-10-CM | POA: Diagnosis not present

## 2015-10-02 DIAGNOSIS — I10 Essential (primary) hypertension: Secondary | ICD-10-CM

## 2015-10-02 MED ORDER — DONEPEZIL HCL 10 MG PO TABS: 10.0000 mg | ORAL_TABLET | Freq: Every day | ORAL | Status: DC

## 2015-10-02 NOTE — Patient Instructions (Signed)
Ok to stop the tramadol  Please take all new medication as prescribed - the tylenol 650 mg every 8 hrs scheduled  Please continue all other medications as before, and refills have been done if requested.  Please have the pharmacy call with any other refills you may need.  Please keep your appointments with your specialists as you may have planned  Please return in 6 months, or sooner if needed

## 2015-10-02 NOTE — Progress Notes (Signed)
Subjective:    Patient ID: Katie Dawson, female    DOB: 1933/10/14, 82 y.o.   MRN: [id number]  HPI  Here after recent hospn apr 13-17 with lethargy and UTI with syncope; has chornic urianry incontinence as well, Labs, EKG unrevealing. CT head negative for acute changes. Last 2 D echo in 2010 showed AI with estimated ejectionfraction was in the range of 60% to 65%. LE Dopplers normal. BS 154Received Narcan with some response. Echocardiogram done, EF 55% with grade 2 diastolic dysfunction  Urinalysis appears suggestive of UTI with TNTC WBC, large leuks and many bacteria.  Per staff, on I/O cath to obtain urine cx, gross purulence was noted Continued on empiric rocephin. Urine cx was unremarkable, albeit was obtained after abx were started  Patient has completed ceftin x 4 more days after discharge.  Also finds tramadol too sedating, prefers tylenol.   Pt denies polydipsia, polyuria. Denies urinary symptoms such as dysuria, frequency, urgency, flank pain, hematuria or n/v, fever, chills.  Pt denies chest pain, increased sob or doe, wheezing, orthopnea, PND, increased LE swelling, palpitations, dizziness or syncope. Past Medical History  Diagnosis Date  . DIABETES MELLITUS, TYPE II 04/22/2007  . HYPERLIPIDEMIA 04/22/2007  . GOUT 04/22/2007  . ANEMIA-IRON DEFICIENCY 04/22/2007  . ANXIETY 04/22/2007  . OBSTRUCTIVE SLEEP APNEA 04/22/2007  . HYPERTENSION 04/22/2007  . GERD 04/22/2007  . RENAL INSUFFICIENCY 04/22/2007  . 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 FOOT PAIN, LEFT 12/23/2007  . OSTEOPENIA 02/12/2009  . INTERMITTENT VERTIGO 04/20/2009  . HYPERSOMNIA 01/11/2009  . COLONIC POLYPS, HX OF 04/22/2007  . NEPHRECTOMY, HX OF 04/22/2007  . Urinary tract infection     hx of  . Right fibular fracture July 2013  . Dementia    Past Surgical History  Procedure Laterality Date  . S/p right nephrectomy    . S/p parathyroid surgury    . Orif wrist fracture  01/02/2012    Procedure: OPEN  REDUCTION INTERNAL FIXATION (ORIF) WRIST FRACTURE;  Surgeon: Schuyler Amor, MD;  Location: [facility];  Service: Orthopedics;  Laterality: Right;  Open Reduction Internal Fixation Right Distal Radius     reports that she has never smoked. She has never used smokeless tobacco. She reports that she does not drink alcohol or use illicit drugs. family history includes Heart disease in her mother. No Known Allergies Current Outpatient Prescriptions on File Prior to Visit  Medication Sig Dispense Refill  . amLODipine (NORVASC) 10 MG tablet Take 10 mg by mouth daily.    Marland Kitchen aspirin EC 81 MG tablet Take 1 tablet (81 mg total) by mouth daily. 30 tablet 0  . atorvastatin (LIPITOR) 10 MG tablet Take 10 mg by mouth daily.    . BD PEN NEEDLE NANO U/F 32G X 4 MM MISC USE WITH LANTUS DAILY. 100 each 6  . busPIRone (BUSPAR) 7.5 MG tablet Take 7.5 mg by mouth 3 (three) times daily.     . cefUROXime (CEFTIN) 250 MG tablet Take 1 tablet (250 mg total) by mouth 2 (two) times daily with a meal. 8 tablet 0  . citalopram (CELEXA) 10 MG tablet Take 10 mg by mouth daily.    . cloNIDine (CATAPRES) 0.1 MG tablet Take 0.1 mg by mouth 2 (two) times daily.    Marland Kitchen gabapentin (NEURONTIN) 300 MG capsule Take 1 capsule (300 mg total) by mouth 3 (three) times daily. 90 capsule 5  . glipiZIDE (GLUCOTROL XL) 5 MG 24 hr tablet Take 1 tablet (5 mg  total) by mouth daily with breakfast. 90 tablet 3  . glucose blood (BAYER CONTOUR NEXT TEST) test strip Use as instructed 100 each 12  . hydrALAZINE (APRESOLINE) 25 MG tablet Take 1 tablet (25 mg total) by mouth every 8 (eight) hours.    . insulin lispro (HUMALOG) 100 UNIT/ML injection Inject 2 Units into the skin 3 (three) times daily before meals. Hold if pt not eating or sugar below 100    . Insulin Pen Needle (BD PEN NEEDLE NANO U/F) 32G X 4 MM MISC 1 Device by Does not apply route daily as needed (insulin).    . isosorbide mononitrate (IMDUR) 60 MG 24 hr tablet Take 60 mg by mouth at  bedtime.    Marland Kitchen labetalol (NORMODYNE) 200 MG tablet Take 200 mg by mouth 2 (two) times daily.    . Lancets MISC Use as directed 1 per day 100 each 12  . risperiDONE (RISPERDAL) 0.5 MG tablet Take 0.5 mg by mouth at bedtime.    . traMADol (ULTRAM) 50 MG tablet Take 1 tablet (50 mg total) by mouth every 6 (six) hours as needed. 120 tablet 5  . insulin glargine (LANTUS) 100 UNIT/ML injection Inject 0.6 mLs (60 Units total) into the skin at bedtime. (Patient taking differently: Inject 16 Units into the skin at bedtime. ) 10 mL 11   No current facility-administered medications on file prior to visit.   Review of Systems  Constitutional: Negative for unusual diaphoresis or night sweats HENT: Negative for ear swelling or discharge Eyes: Negative for worsening visual haziness  Respiratory: Negative for choking and stridor.   Gastrointestinal: Negative for distension or worsening eructation Genitourinary: Negative for retention or change in urine volume.  Musculoskeletal: Negative for other MSK pain or swelling Skin: Negative for color change and worsening wound Neurological: Negative for tremors and numbness other than noted  Psychiatric/Behavioral: Negative for decreased concentration or agitation other than above       Objective:   Physical Exam BP 140/78 mmHg  Pulse 55  Temp(Src) 98.3 F (36.8 C) (Oral)  Resp 20  Wt 201 lb (91.173 kg)  SpO2 98% VS noted,  Constitutional: Pt appears in no apparent distress HENT: Head: NCAT.  Right Ear: External ear normal.  Left Ear: External ear normal.  Eyes: . Pupils are equal, round, and reactive to light. Conjunctivae and EOM are normal Neck: Normal range of motion. Neck supple.  Cardiovascular: Normal rate and regular rhythm.   Pulmonary/Chest: Effort normal and breath sounds without rales or wheezing.  Abd:  Soft, NT, ND, + BS Neurological: Pt is alert. Not confused , motor grossly intact Skin: Skin is warm. No rash, no LE edema Psychiatric:  Pt behavior is normal. No agitation.      Assessment & Plan:

## 2015-10-02 NOTE — Progress Notes (Signed)
Pre visit review using our clinic review tool, if applicable. No additional management support is needed unless otherwise documented below in the visit note. 

## 2015-10-07 DIAGNOSIS — R55 Syncope and collapse: Secondary | ICD-10-CM | POA: Insufficient documentation

## 2015-10-07 NOTE — Assessment & Plan Note (Signed)
Asympt, treated successfully,  to f/u any worsening symptoms or concerns

## 2015-10-07 NOTE — Assessment & Plan Note (Signed)
stable overall by history and exam, recent data reviewed with pt, and pt to continue medical treatment as before,  to f/u any worsening symptoms or concerns BP Readings from Last 3 Encounters:  10/02/15 140/78  09/17/15 150/49  08/31/15 116/74

## 2015-10-07 NOTE — Assessment & Plan Note (Signed)
Etiology not clear, no further symptoms, likely related to recent illness,  to f/u any worsening symptoms or concerns

## 2015-11-01 ENCOUNTER — Other Ambulatory Visit: Payer: Self-pay | Admitting: Internal Medicine

## 2015-11-02 ENCOUNTER — Other Ambulatory Visit: Payer: Self-pay | Admitting: *Deleted

## 2015-11-02 MED ORDER — ACETAMINOPHEN 325 MG PO TABS: 650.0000 mg | ORAL_TABLET | Freq: Three times a day (TID) | ORAL | Status: DC | PRN

## 2015-11-09 ENCOUNTER — Telehealth: Payer: Self-pay

## 2015-11-09 MED ORDER — NYSTATIN 100000 UNIT/GM EX POWD: Freq: Four times a day (QID) | CUTANEOUS | Status: DC

## 2015-11-09 NOTE — Telephone Encounter (Signed)
[facility] stating pt has odor coming from rash under lower flab from stomach. Would like an order for nystatin powder. Pt was seen by PCP on 5/02 and he is out today. Please advise

## 2015-11-09 NOTE — Telephone Encounter (Signed)
Ok rx sent to pharmacy  

## 2015-11-12 ENCOUNTER — Other Ambulatory Visit: Payer: Self-pay | Admitting: Internal Medicine

## 2015-12-26 ENCOUNTER — Ambulatory Visit (INDEPENDENT_AMBULATORY_CARE_PROVIDER_SITE_OTHER): Payer: Medicare Other | Admitting: Internal Medicine

## 2015-12-26 ENCOUNTER — Encounter: Payer: Self-pay | Admitting: Emergency Medicine

## 2015-12-26 VITALS — BP 112/58 | HR 56 | Temp 97.7°F | Resp 16

## 2015-12-26 DIAGNOSIS — N259 Disorder resulting from impaired renal tubular function, unspecified: Secondary | ICD-10-CM

## 2015-12-26 DIAGNOSIS — IMO0001 Reserved for inherently not codable concepts without codable children: Secondary | ICD-10-CM

## 2015-12-26 DIAGNOSIS — I10 Essential (primary) hypertension: Secondary | ICD-10-CM | POA: Diagnosis not present

## 2015-12-26 DIAGNOSIS — Z794 Long term (current) use of insulin: Secondary | ICD-10-CM

## 2015-12-26 DIAGNOSIS — G4730 Sleep apnea, unspecified: Secondary | ICD-10-CM

## 2015-12-26 DIAGNOSIS — E119 Type 2 diabetes mellitus without complications: Secondary | ICD-10-CM | POA: Diagnosis not present

## 2015-12-26 NOTE — Patient Instructions (Signed)
Please continue all other medications as before, and refills have been done if requested.  Please have the pharmacy call with any other refills you may need.  Please continue your efforts at being more active, low cholesterol diet, and weight control.  You are otherwise up to date with prevention measures today.  Please keep your appointments with your specialists as you may have planned  Please go to the LAB in the Basement (turn left off the elevator) for the tests to be done at your convenience  You will be contacted by phone if any changes need to be made immediately.  Otherwise, you will receive a letter about your results with an explanation, but please check with MyChart first.  Please remember to sign up for MyChart if you have not done so, as this will be important to you in the future with finding out test results, communicating by private email, and scheduling acute appointments online when needed.   

## 2015-12-26 NOTE — Progress Notes (Signed)
Pre visit review using our clinic review tool, if applicable. No additional management support is needed unless otherwise documented below in the visit note. 

## 2015-12-27 ENCOUNTER — Telehealth: Payer: Self-pay | Admitting: Internal Medicine

## 2015-12-27 NOTE — Telephone Encounter (Signed)
States made appointment for patient to be seen yesterday for hemorrhoids.  States they did not get any orders back as to how to treat this.  States patients hemorrhoids are really swollen and have blood clots.  Also states that maybe patient might need something to soften her stole.  Please follow back up in regard with order for patient as to how to treat this.

## 2015-12-28 ENCOUNTER — Telehealth: Payer: Self-pay

## 2015-12-28 NOTE — Telephone Encounter (Signed)
Yes I can send something in - are they internal or external hemorrhoids?

## 2015-12-28 NOTE — Telephone Encounter (Signed)
Please advise, patient facility called and stated she is in extreme pain and is needing something for her hemmorhoids. Can an order be sent over while Dr. Jenny Reichmann is out of the office

## 2015-12-29 NOTE — Assessment & Plan Note (Addendum)
Unclear how much symptoms, declines further f/u at this time

## 2015-12-29 NOTE — Assessment & Plan Note (Signed)
stable overall by history and exam, recent data reviewed with pt, and pt to continue medical treatment as before,  to f/u any worsening symptoms or concerns Lab Results  Component Value Date   CREATININE 1.07 (H) 09/15/2015   Cont to avoid nephrotoxins

## 2015-12-29 NOTE — Assessment & Plan Note (Signed)
stable overall by history and exam, recent data reviewed with pt, and pt to continue medical treatment as before,  to f/u any worsening symptoms or concerns Lab Results  Component Value Date   HGBA1C 6.9 (H) 08/31/2015

## 2015-12-29 NOTE — Assessment & Plan Note (Signed)
stable overall by history and exam, recent data reviewed with pt, and pt to continue medical treatment as before,  to f/u any worsening symptoms or concerns BP Readings from Last 3 Encounters:  12/26/15 (!) 112/58  10/02/15 140/78  09/17/15 (!) 150/49

## 2015-12-29 NOTE — Progress Notes (Signed)
Subjective:    Patient ID: Katie Dawson, female    DOB: 07-09-1933, 82 y.o.   MRN: [id number]  HPI  Here with listed c/o hemorrhoid but apparently an error.  Pt here with supportive family and in home aide.   Pt with dementia but family and pt denies chest pain, increasing sob or doe, wheezing, orthopnea, PND, increased LE swelling, palpitations, dizziness or syncope.  Pt denies new neurological symptoms such as new headache, or facial or extremity weakness or numbness.  Pt denies polydipsia, polyuria, or low sugar episode.   Pt denies new neurological symptoms such as new headache, or facial or extremity weakness or numbness.   Pt states overall good compliance with meds, not realyl trying to follow a particular diet, with wt overall down several labs, but pt states appetite ok, Denies worsening reflux, abd pain, dysphagia, n/v, bowel change or blood.   Wt Readings from Last 3 Encounters:  10/02/15 201 lb (91.2 kg)  09/17/15 210 lb 8.6 oz (95.5 kg)  08/31/15 218 lb (98.9 kg)  Family does have concern about sleep apnea f/u, tends to sleep much of the days, not sure how much related to osa, but may be sleeping less lately with wt loss.   Denies hyper or hypo thyroid symptoms such as voice, skin or hair change. Pt now has worsening LE weakness, wants to sit, will not do PT Past Medical History:  Diagnosis Date  . ANEMIA-IRON DEFICIENCY 04/22/2007  . ANXIETY 04/22/2007  . COLONIC POLYPS, HX OF 04/22/2007  . Dementia   . DIABETES MELLITUS, TYPE II 04/22/2007  . FOOT PAIN, LEFT 12/23/2007  . GERD 04/22/2007  . GOUT 04/22/2007  . HYPERLIPIDEMIA 04/22/2007  . HYPERSOMNIA 01/11/2009  . HYPERTENSION 04/22/2007  . INTERMITTENT VERTIGO 04/20/2009  . NEPHRECTOMY, HX OF 04/22/2007  . OBSTRUCTIVE SLEEP APNEA 04/22/2007  . 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 OSTEOPENIA 02/12/2009  . RENAL INSUFFICIENCY 04/22/2007  . Right fibular fracture July 2013  . Urinary tract infection    hx of    Past Surgical History:  Procedure Laterality Date  . ORIF WRIST FRACTURE  01/02/2012   Procedure: OPEN REDUCTION INTERNAL FIXATION (ORIF) WRIST FRACTURE;  Surgeon: Schuyler Amor, MD;  Location: [facility];  Service: Orthopedics;  Laterality: Right;  Open Reduction Internal Fixation Right Distal Radius   . s/p parathyroid surgury    . s/p right nephrectomy      reports that she has never smoked. She has never used smokeless tobacco. She reports that she does not drink alcohol or use drugs. family history includes Heart disease in her mother. No Known Allergies Current Outpatient Prescriptions on File Prior to Visit  Medication Sig Dispense Refill  . acetaminophen (TYLENOL) 325 MG tablet Take 2 tablets (650 mg total) by mouth every 8 (eight) hours as needed. 180 tablet 1  . amLODipine (NORVASC) 10 MG tablet Take 10 mg by mouth daily.    Marland Kitchen aspirin EC 81 MG tablet Take 1 tablet (81 mg total) by mouth daily. 30 tablet 0  . atorvastatin (LIPITOR) 10 MG tablet Take 10 mg by mouth daily.    . BD PEN NEEDLE NANO U/F 32G X 4 MM MISC USE WITH LANTUS DAILY. 100 each 6  . busPIRone (BUSPAR) 7.5 MG tablet Take 7.5 mg by mouth 3 (three) times daily.     . cefUROXime (CEFTIN) 250 MG tablet Take 1 tablet (250 mg total) by mouth 2 (two) times daily with a meal. 8 tablet  0  . citalopram (CELEXA) 10 MG tablet Take 10 mg by mouth daily.    . cloNIDine (CATAPRES) 0.1 MG tablet Take 0.1 mg by mouth 2 (two) times daily.    Marland Kitchen donepezil (ARICEPT) 10 MG tablet TAKE 1 TABLET BY MOUTH ONCE DAILY. 90 tablet 1  . gabapentin (NEURONTIN) 300 MG capsule TAKE 1 CAPSULE BY MOUTH 3 TIMES DAILY. 90 capsule 0  . glipiZIDE (GLUCOTROL XL) 5 MG 24 hr tablet Take 1 tablet (5 mg total) by mouth daily with breakfast. 90 tablet 3  . glucose blood (BAYER CONTOUR NEXT TEST) test strip Use as instructed 100 each 12  . hydrALAZINE (APRESOLINE) 25 MG tablet Take 1 tablet (25 mg total) by mouth every 8 (eight) hours.    . insulin lispro  (HUMALOG) 100 UNIT/ML injection Inject 2 Units into the skin 3 (three) times daily before meals. Hold if pt not eating or sugar below 100    . Insulin Pen Needle (BD PEN NEEDLE NANO U/F) 32G X 4 MM MISC 1 Device by Does not apply route daily as needed (insulin).    . isosorbide mononitrate (IMDUR) 60 MG 24 hr tablet Take 60 mg by mouth at bedtime.    Marland Kitchen labetalol (NORMODYNE) 200 MG tablet Take 200 mg by mouth 2 (two) times daily.    . Lancets MISC Use as directed 1 per day 100 each 12  . nystatin (NYSTATIN) powder Apply topically 4 (four) times daily. 45 g 5  . risperiDONE (RISPERDAL) 0.5 MG tablet Take 0.5 mg by mouth at bedtime.    . traMADol (ULTRAM) 50 MG tablet Take 1 tablet (50 mg total) by mouth every 6 (six) hours as needed. 120 tablet 5  . insulin glargine (LANTUS) 100 UNIT/ML injection Inject 0.6 mLs (60 Units total) into the skin at bedtime. (Patient taking differently: Inject 16 Units into the skin at bedtime. ) 10 mL 11   No current facility-administered medications on file prior to visit.    Review of Systems  Constitutional: Negative for unusual diaphoresis or night sweats HENT: Negative for ear swelling or discharge Eyes: Negative for worsening visual haziness  Respiratory: Negative for choking and stridor.   Gastrointestinal: Negative for distension or worsening eructation Genitourinary: Negative for retention or change in urine volume.  Musculoskeletal: Negative for other MSK pain or swelling Skin: Negative for color change and worsening wound Neurological: Negative for tremors and numbness other than noted  Psychiatric/Behavioral: Negative for decreased concentration or agitation other than above       Objective:   Physical Exam BP (!) 112/58   Pulse (!) 56   Temp 97.7 F (36.5 C) (Oral)   Resp 16   SpO2 97%  VS noted, examined in wheelchair Constitutional: Pt appears in no apparent distress HENT: Head: NCAT.  Right Ear: External ear normal.  Left Ear: External  ear normal.  Eyes: . Pupils are equal, round, and reactive to light. Conjunctivae and EOM are normal Neck: Normal range of motion. Neck supple.  Cardiovascular: Normal rate and regular rhythm.   Pulmonary/Chest: Effort normal and breath sounds without rales or wheezing.  Abd:  Soft, NT, ND, + BS Neurological: Pt is alert. At baseline confused , motor grossly intact but pt only noted to move LE's,o/w uncooperative  Skin: Skin is warm. No rash, no LE edema Psychiatric: Pt behavior is normal. No agitation.     Assessment & Plan:

## 2015-12-31 MED ORDER — HYDROCORTISONE 2.5 % RE CREA: 1.0000 "application " | TOPICAL_CREAM | Freq: Two times a day (BID) | RECTAL | 0 refills | Status: DC

## 2015-12-31 NOTE — Telephone Encounter (Signed)
Sent to pof 

## 2015-12-31 NOTE — Addendum Note (Signed)
Addended by: Binnie Rail on: 12/31/2015 12:23 PM   Modules accepted: Orders

## 2016-01-01 ENCOUNTER — Other Ambulatory Visit: Payer: Self-pay | Admitting: Internal Medicine

## 2016-01-04 ENCOUNTER — Ambulatory Visit (INDEPENDENT_AMBULATORY_CARE_PROVIDER_SITE_OTHER): Payer: Medicare Other | Admitting: Internal Medicine

## 2016-01-04 ENCOUNTER — Encounter: Payer: Self-pay | Admitting: Internal Medicine

## 2016-01-04 ENCOUNTER — Other Ambulatory Visit (INDEPENDENT_AMBULATORY_CARE_PROVIDER_SITE_OTHER): Payer: Medicare Other

## 2016-01-04 VITALS — BP 116/62 | HR 76 | Temp 97.9°F | Resp 17 | Ht 66.0 in | Wt 200.5 lb

## 2016-01-04 DIAGNOSIS — I10 Essential (primary) hypertension: Secondary | ICD-10-CM

## 2016-01-04 DIAGNOSIS — IMO0001 Reserved for inherently not codable concepts without codable children: Secondary | ICD-10-CM

## 2016-01-04 DIAGNOSIS — K921 Melena: Secondary | ICD-10-CM

## 2016-01-04 DIAGNOSIS — K645 Perianal venous thrombosis: Secondary | ICD-10-CM | POA: Diagnosis not present

## 2016-01-04 DIAGNOSIS — K625 Hemorrhage of anus and rectum: Secondary | ICD-10-CM | POA: Diagnosis not present

## 2016-01-04 DIAGNOSIS — E119 Type 2 diabetes mellitus without complications: Secondary | ICD-10-CM | POA: Diagnosis not present

## 2016-01-04 DIAGNOSIS — F0390 Unspecified dementia without behavioral disturbance: Secondary | ICD-10-CM

## 2016-01-04 DIAGNOSIS — Z794 Long term (current) use of insulin: Secondary | ICD-10-CM | POA: Diagnosis not present

## 2016-01-04 LAB — CBC WITH DIFFERENTIAL/PLATELET
BASOS PCT: 0.2 % (ref 0.0–3.0)
Basophils Absolute: 0 10*3/uL (ref 0.0–0.1)
EOS PCT: 1.5 % (ref 0.0–5.0)
Eosinophils Absolute: 0.2 10*3/uL (ref 0.0–0.7)
HEMATOCRIT: 31.7 % — AB (ref 36.0–46.0)
HEMOGLOBIN: 10.5 g/dL — AB (ref 12.0–15.0)
LYMPHS PCT: 16.3 % (ref 12.0–46.0)
Lymphs Abs: 1.6 10*3/uL (ref 0.7–4.0)
MCHC: 33.1 g/dL (ref 30.0–36.0)
MCV: 86.6 fl (ref 78.0–100.0)
MONOS PCT: 4 % (ref 3.0–12.0)
Monocytes Absolute: 0.4 10*3/uL (ref 0.1–1.0)
NEUTROS ABS: 7.9 10*3/uL — AB (ref 1.4–7.7)
Neutrophils Relative %: 78 % — ABNORMAL HIGH (ref 43.0–77.0)
PLATELETS: 348 10*3/uL (ref 150.0–400.0)
RBC: 3.66 Mil/uL — ABNORMAL LOW (ref 3.87–5.11)
RDW: 14.7 % (ref 11.5–15.5)
WBC: 10.1 10*3/uL (ref 4.0–10.5)

## 2016-01-04 LAB — LIPID PANEL
CHOL/HDL RATIO: 6
Cholesterol: 165 mg/dL (ref 0–200)
HDL: 28.1 mg/dL — AB (ref >39.00)
LDL Cholesterol: 99 mg/dL (ref 0–99)
NONHDL: 136.73
Triglycerides: 189 mg/dL — ABNORMAL HIGH (ref 0.0–149.0)
VLDL: 37.8 mg/dL (ref 0.0–40.0)

## 2016-01-04 LAB — HEPATIC FUNCTION PANEL
ALT: 10 U/L (ref 0–35)
AST: 8 U/L (ref 0–37)
Albumin: 3.6 g/dL (ref 3.5–5.2)
Alkaline Phosphatase: 62 U/L (ref 39–117)
BILIRUBIN DIRECT: 0.1 mg/dL (ref 0.0–0.3)
BILIRUBIN TOTAL: 0.4 mg/dL (ref 0.2–1.2)
Total Protein: 6.8 g/dL (ref 6.0–8.3)

## 2016-01-04 LAB — BASIC METABOLIC PANEL
BUN: 22 mg/dL (ref 6–23)
CALCIUM: 9.8 mg/dL (ref 8.4–10.5)
CHLORIDE: 108 meq/L (ref 96–112)
CO2: 30 mEq/L (ref 19–32)
CREATININE: 1.24 mg/dL — AB (ref 0.40–1.20)
GFR: 53.55 mL/min — ABNORMAL LOW (ref >60.00)
Glucose, Bld: 231 mg/dL — ABNORMAL HIGH (ref 70–99)
Potassium: 4.1 mEq/L (ref 3.5–5.1)
SODIUM: 146 meq/L — AB (ref 135–145)

## 2016-01-04 LAB — PROTIME-INR
INR: 1.1 ratio — ABNORMAL HIGH (ref 0.8–1.0)
PROTHROMBIN TIME: 11.6 s (ref 9.6–13.1)

## 2016-01-04 LAB — HEMOGLOBIN A1C: Hgb A1c MFr Bld: 6.5 % (ref 4.6–6.5)

## 2016-01-04 MED ORDER — LIDOCAINE-HYDROCORTISONE ACE 3-0.5 % RE CREA: 1.0000 | TOPICAL_CREAM | Freq: Two times a day (BID) | RECTAL | 1 refills | Status: DC

## 2016-01-04 NOTE — Progress Notes (Signed)
Subjective:    Patient ID: Katie Dawson, female    DOB: 12-20-1933, 82 y.o.   MRN: [id number]  HPI  Here to f/u with family, c/o several day new onset rectal pain, swelling and small BRBPR, pt quite uncomfortable, hard to sit which she does most of the day, though can stand with assist, cannot walk well however even with assist.  Pt denies chest pain, increased sob or doe, wheezing, orthopnea, PND, increased LE swelling, palpitations, dizziness or syncope.  Pt denies new neurological symptoms such as new headache, or facial or extremity weakness or numbness. Dementia overall stable symptomatically, and not assoc with behavioral changes such as hallucinations, paranoia, or agitation. Past Medical History:  Diagnosis Date  . ANEMIA-IRON DEFICIENCY 04/22/2007  . ANXIETY 04/22/2007  . COLONIC POLYPS, HX OF 04/22/2007  . Dementia   . DIABETES MELLITUS, TYPE II 04/22/2007  . FOOT PAIN, LEFT 12/23/2007  . GERD 04/22/2007  . GOUT 04/22/2007  . HYPERLIPIDEMIA 04/22/2007  . HYPERSOMNIA 01/11/2009  . HYPERTENSION 04/22/2007  . INTERMITTENT VERTIGO 04/20/2009  . NEPHRECTOMY, HX OF 04/22/2007  . OBSTRUCTIVE SLEEP APNEA 04/22/2007  . 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 OSTEOPENIA 02/12/2009  . RENAL INSUFFICIENCY 04/22/2007  . Right fibular fracture July 2013  . Urinary tract infection    hx of   Past Surgical History:  Procedure Laterality Date  . ORIF WRIST FRACTURE  01/02/2012   Procedure: OPEN REDUCTION INTERNAL FIXATION (ORIF) WRIST FRACTURE;  Surgeon: Schuyler Amor, MD;  Location: [facility];  Service: Orthopedics;  Laterality: Right;  Open Reduction Internal Fixation Right Distal Radius   . s/p parathyroid surgury    . s/p right nephrectomy      reports that she has never smoked. She has never used smokeless tobacco. She reports that she does not drink alcohol or use drugs. family history includes Heart disease in her mother. No Known Allergies Current Outpatient  Prescriptions on File Prior to Visit  Medication Sig Dispense Refill  . acetaminophen (TYLENOL) 325 MG tablet Take 2 tablets (650 mg total) by mouth every 8 (eight) hours as needed. 180 tablet 1  . amLODipine (NORVASC) 10 MG tablet Take 10 mg by mouth daily.    Marland Kitchen aspirin EC 81 MG tablet Take 1 tablet (81 mg total) by mouth daily. 30 tablet 0  . atorvastatin (LIPITOR) 10 MG tablet Take 10 mg by mouth daily.    . busPIRone (BUSPAR) 7.5 MG tablet Take 7.5 mg by mouth 3 (three) times daily.     . citalopram (CELEXA) 10 MG tablet Take 10 mg by mouth daily.    . cloNIDine (CATAPRES) 0.1 MG tablet Take 0.1 mg by mouth 2 (two) times daily.    Marland Kitchen donepezil (ARICEPT) 10 MG tablet TAKE 1 TABLET BY MOUTH ONCE DAILY. 90 tablet 1  . glipiZIDE (GLUCOTROL XL) 5 MG 24 hr tablet Take 1 tablet (5 mg total) by mouth daily with breakfast. 90 tablet 3  . glucose blood (BAYER CONTOUR NEXT TEST) test strip Use as instructed 100 each 12  . insulin lispro (HUMALOG) 100 UNIT/ML injection Inject 2 Units into the skin 3 (three) times daily before meals. Hold if pt not eating or sugar below 100    . Insulin Pen Needle (BD PEN NEEDLE NANO U/F) 32G X 4 MM MISC 1 Device by Does not apply route daily as needed (insulin).    . isosorbide mononitrate (IMDUR) 60 MG 24 hr tablet Take 60 mg by mouth at bedtime.    Marland Kitchen  labetalol (NORMODYNE) 200 MG tablet Take 200 mg by mouth 2 (two) times daily.    . Lancets MISC Use as directed 1 per day 100 each 12  . nystatin (NYSTATIN) powder Apply topically 4 (four) times daily. 45 g 5  . risperiDONE (RISPERDAL) 0.5 MG tablet Take 0.5 mg by mouth at bedtime.    . traMADol (ULTRAM) 50 MG tablet Take 1 tablet (50 mg total) by mouth every 6 (six) hours as needed. 120 tablet 5  . B-D INS SYRINGE 0.5CC/31GX5/16 31G X 5/16" 0.5 ML MISC USE AS DIRECTED FOR INSULIN ADMINISTRATION. 100 each 3  . BD PEN NEEDLE NANO U/F 32G X 4 MM MISC USE WITH LANTUS DAILY. 100 each 6  . gabapentin (NEURONTIN) 300 MG capsule  TAKE 1 CAPSULE BY MOUTH 3 TIMES DAILY. (Patient not taking: Reported on 01/04/2016) 90 capsule 0  . hydrALAZINE (APRESOLINE) 25 MG tablet Take 1 tablet (25 mg total) by mouth every 8 (eight) hours. (Patient not taking: Reported on 01/04/2016)    . hydrocortisone (ANUSOL-HC) 2.5 % rectal cream Place 1 application rectally 2 (two) times daily. 30 g 0  . insulin glargine (LANTUS) 100 UNIT/ML injection Inject 0.6 mLs (60 Units total) into the skin at bedtime. (Patient taking differently: Inject 16 Units into the skin at bedtime. ) 10 mL 11   No current facility-administered medications on file prior to visit.    Review of Systems  Constitutional: Negative for unusual diaphoresis or night sweats HENT: Negative for ear swelling or discharge Eyes: Negative for worsening visual haziness  Respiratory: Negative for choking and stridor.   Gastrointestinal: Negative for distension or worsening eructation Genitourinary: Negative for retention or change in urine volume.  Musculoskeletal: Negative for other MSK pain or swelling Skin: Negative for color change and worsening wound Neurological: Negative for tremors and numbness other than noted  Psychiatric/Behavioral: Negative for decreased concentration or agitation other than above       Objective:   Physical Exam BP 116/62   Pulse 76   Temp 97.9 F (36.6 C) (Oral)   Resp 17   Ht 5\' 6"  (1.676 m)   Wt 200 lb 8 oz (90.9 kg)   SpO2 98%   BMI 32.36 kg/m  VS noted,  Constitutional: Pt appears in no apparent distress HENT: Head: NCAT.  Right Ear: External ear normal.  Left Ear: External ear normal.  Eyes: . Pupils are equal, round, and reactive to light. Conjunctivae and EOM are normal Neck: Normal range of motion. Neck supple.  Cardiovascular: Normal rate and regular rhythm.   Pulmonary/Chest: Effort normal and breath sounds without rales or wheezing.  Abd:  Soft, NT, ND, + BS Rectal:  7oclock mod sized tender thrombosed hemorrhoid, no overt  bleeding Neurological: Pt is alert. severe confused at baseline, motor grossly intact Skin: Skin is warm. No rash, no LE edema Psychiatric: Pt behavior is normal. No agitation.     Assessment & Plan:

## 2016-01-04 NOTE — Patient Instructions (Signed)
Please take all new medication as prescribed - the cream twice per day  Please continue all other medications as before, and refills have been done if requested.  Please have the pharmacy call with any other refills you may need.  Please keep your appointments with your specialists as you may have planned  You will be contacted regarding the referral for: general surgury - to see [facility] now  Please go to the LAB in the Basement (turn left off the elevator) for the tests to be done today  You will be contacted by phone if any changes need to be made immediately.  Otherwise, you will receive a letter about your results with an explanation, but please check with MyChart first.  Please remember to sign up for MyChart if you have not done so, as this will be important to you in the future with finding out test results, communicating by private email, and scheduling acute appointments online when needed.

## 2016-01-05 NOTE — Assessment & Plan Note (Addendum)
No overt bleeding noted today, for cbc, inr, family declines GI referral

## 2016-01-05 NOTE — Assessment & Plan Note (Signed)
Mod with discomfort, for anamantle asd, refer gen surgury

## 2016-01-05 NOTE — Assessment & Plan Note (Signed)
stable overall by history and exam, recent data reviewed with pt, and pt to continue medical treatment as before,  to f/u any worsening symptoms or concerns BP Readings from Last 3 Encounters:  01/04/16 116/62  12/26/15 (!) 112/58  10/02/15 140/78

## 2016-01-05 NOTE — Assessment & Plan Note (Signed)
stable overall by history and exam, and pt to continue medical treatment as before,  to f/u any worsening symptoms or concerns 

## 2016-01-07 ENCOUNTER — Telehealth: Payer: Self-pay

## 2016-01-07 NOTE — Telephone Encounter (Signed)
lidocaine-hydrocortisone (ANAMANTEL HC) 3-0.5 % CREA [id number]   The nursing home called and stated that this medication her insurance will not cover and its +$200. They would like to know if they could get something different for her. Please follow up, Thank you.

## 2016-01-07 NOTE — Telephone Encounter (Signed)
Left message at phone number provided to check with patient's insurance to see what similar creams they will pay for and call us back with a list of those=----that can then be routed to dr Jenny Reichmann for him to choose from a list of meds covered by insurance to avoid further delays by sending in rx's that still could not be covered----can talk with tamara if any questions

## 2016-01-07 NOTE — Telephone Encounter (Signed)
Called and talked with patient's daughter---she is contacting nursing home and insurance to see what cream they will cover and call our office back with that request

## 2016-01-07 NOTE — Telephone Encounter (Signed)
Pt request to speak to the assistant concern about this, she is not sure which one insurance will cover, request Dr. Jenny Reichmann to change to something cheaper.

## 2016-01-09 ENCOUNTER — Other Ambulatory Visit: Payer: Self-pay | Admitting: Internal Medicine

## 2016-01-10 ENCOUNTER — Other Ambulatory Visit: Payer: Self-pay | Admitting: *Deleted

## 2016-01-10 MED ORDER — ACETAMINOPHEN 325 MG PO TABS: ORAL_TABLET | ORAL | 3 refills | Status: DC

## 2016-01-29 ENCOUNTER — Telehealth: Payer: Self-pay | Admitting: Emergency Medicine

## 2016-01-29 NOTE — Telephone Encounter (Signed)
Patients daughter called asking about patients FMLA paperwork. She needs them ASAP. Please call patient when she is able to come pick them up thanks.

## 2016-01-29 NOTE — Telephone Encounter (Signed)
I am not sure about this, ok to forward to dahlia

## 2016-01-30 NOTE — Telephone Encounter (Signed)
I am unaware of ay such paperwork, perhaps you should follow up with the patient or he daughter

## 2016-02-04 ENCOUNTER — Observation Stay (HOSPITAL_COMMUNITY): Payer: Medicare Other

## 2016-02-04 ENCOUNTER — Inpatient Hospital Stay (HOSPITAL_COMMUNITY)
Admission: EM | Admit: 2016-02-04 | Discharge: 2016-02-09 | DRG: 682 | Disposition: A | Discharge status: Nursing Home (Includes ICF) | Payer: Medicare Other | Attending: Internal Medicine | Admitting: Internal Medicine

## 2016-02-04 ENCOUNTER — Emergency Department (HOSPITAL_COMMUNITY): Payer: Medicare Other

## 2016-02-04 DIAGNOSIS — N289 Disorder of kidney and ureter, unspecified: Secondary | ICD-10-CM | POA: Diagnosis present

## 2016-02-04 DIAGNOSIS — C7800 Secondary malignant neoplasm of unspecified lung: Secondary | ICD-10-CM | POA: Diagnosis present

## 2016-02-04 DIAGNOSIS — R222 Localized swelling, mass and lump, trunk: Secondary | ICD-10-CM

## 2016-02-04 DIAGNOSIS — I350 Nonrheumatic aortic (valve) stenosis: Secondary | ICD-10-CM | POA: Diagnosis present

## 2016-02-04 DIAGNOSIS — Z85528 Personal history of other malignant neoplasm of kidney: Secondary | ICD-10-CM

## 2016-02-04 DIAGNOSIS — Z87440 Personal history of urinary (tract) infections: Secondary | ICD-10-CM

## 2016-02-04 DIAGNOSIS — M109 Gout, unspecified: Secondary | ICD-10-CM | POA: Diagnosis present

## 2016-02-04 DIAGNOSIS — Z23 Encounter for immunization: Secondary | ICD-10-CM

## 2016-02-04 DIAGNOSIS — E785 Hyperlipidemia, unspecified: Secondary | ICD-10-CM | POA: Diagnosis present

## 2016-02-04 DIAGNOSIS — Z7982 Long term (current) use of aspirin: Secondary | ICD-10-CM

## 2016-02-04 DIAGNOSIS — G92 Toxic encephalopathy: Secondary | ICD-10-CM | POA: Diagnosis present

## 2016-02-04 DIAGNOSIS — R9389 Abnormal findings on diagnostic imaging of other specified body structures: Secondary | ICD-10-CM | POA: Diagnosis present

## 2016-02-04 DIAGNOSIS — G4733 Obstructive sleep apnea (adult) (pediatric): Secondary | ICD-10-CM | POA: Diagnosis present

## 2016-02-04 DIAGNOSIS — F419 Anxiety disorder, unspecified: Secondary | ICD-10-CM | POA: Diagnosis present

## 2016-02-04 DIAGNOSIS — K219 Gastro-esophageal reflux disease without esophagitis: Secondary | ICD-10-CM | POA: Diagnosis present

## 2016-02-04 DIAGNOSIS — G4710 Hypersomnia, unspecified: Secondary | ICD-10-CM | POA: Diagnosis present

## 2016-02-04 DIAGNOSIS — R4182 Altered mental status, unspecified: Secondary | ICD-10-CM | POA: Diagnosis not present

## 2016-02-04 DIAGNOSIS — Z79899 Other long term (current) drug therapy: Secondary | ICD-10-CM

## 2016-02-04 DIAGNOSIS — I10 Essential (primary) hypertension: Secondary | ICD-10-CM | POA: Diagnosis present

## 2016-02-04 DIAGNOSIS — F0280 Dementia in other diseases classified elsewhere without behavioral disturbance: Secondary | ICD-10-CM | POA: Diagnosis present

## 2016-02-04 DIAGNOSIS — D509 Iron deficiency anemia, unspecified: Secondary | ICD-10-CM | POA: Diagnosis present

## 2016-02-04 DIAGNOSIS — N179 Acute kidney failure, unspecified: Secondary | ICD-10-CM | POA: Diagnosis not present

## 2016-02-04 DIAGNOSIS — M8580 Other specified disorders of bone density and structure, unspecified site: Secondary | ICD-10-CM | POA: Diagnosis present

## 2016-02-04 DIAGNOSIS — Z794 Long term (current) use of insulin: Secondary | ICD-10-CM

## 2016-02-04 DIAGNOSIS — G309 Alzheimer's disease, unspecified: Secondary | ICD-10-CM | POA: Diagnosis present

## 2016-02-04 DIAGNOSIS — D72829 Elevated white blood cell count, unspecified: Secondary | ICD-10-CM | POA: Diagnosis present

## 2016-02-04 DIAGNOSIS — Z905 Acquired absence of kidney: Secondary | ICD-10-CM

## 2016-02-04 DIAGNOSIS — R918 Other nonspecific abnormal finding of lung field: Secondary | ICD-10-CM | POA: Diagnosis present

## 2016-02-04 DIAGNOSIS — M170 Bilateral primary osteoarthritis of knee: Secondary | ICD-10-CM | POA: Diagnosis present

## 2016-02-04 DIAGNOSIS — G629 Polyneuropathy, unspecified: Secondary | ICD-10-CM | POA: Diagnosis present

## 2016-02-04 DIAGNOSIS — E119 Type 2 diabetes mellitus without complications: Secondary | ICD-10-CM | POA: Diagnosis present

## 2016-02-04 DIAGNOSIS — IMO0001 Reserved for inherently not codable concepts without codable children: Secondary | ICD-10-CM

## 2016-02-04 DIAGNOSIS — R627 Adult failure to thrive: Secondary | ICD-10-CM | POA: Diagnosis present

## 2016-02-04 DIAGNOSIS — G9340 Encephalopathy, unspecified: Secondary | ICD-10-CM

## 2016-02-04 LAB — CBC
HEMATOCRIT: 33.2 % — AB (ref 36.0–46.0)
Hemoglobin: 10.6 g/dL — ABNORMAL LOW (ref 12.0–15.0)
MCH: 28.4 pg (ref 26.0–34.0)
MCHC: 31.9 g/dL (ref 30.0–36.0)
MCV: 89 fL (ref 78.0–100.0)
PLATELETS: 314 10*3/uL (ref 150–400)
RBC: 3.73 MIL/uL — ABNORMAL LOW (ref 3.87–5.11)
RDW: 14.3 % (ref 11.5–15.5)
WBC: 12.1 10*3/uL — ABNORMAL HIGH (ref 4.0–10.5)

## 2016-02-04 LAB — COMPREHENSIVE METABOLIC PANEL
ALK PHOS: 55 U/L (ref 38–126)
ALT: 15 U/L (ref 14–54)
AST: 17 U/L (ref 15–41)
Albumin: 3.4 g/dL — ABNORMAL LOW (ref 3.5–5.0)
Anion gap: 8 (ref 5–15)
BUN: 31 mg/dL — AB (ref 6–20)
CO2: 26 mmol/L (ref 22–32)
CREATININE: 1.68 mg/dL — AB (ref 0.44–1.00)
Calcium: 9.8 mg/dL (ref 8.9–10.3)
Chloride: 111 mmol/L (ref 101–111)
GFR calc Af Amer: 32 mL/min — ABNORMAL LOW (ref >60)
GFR calc non Af Amer: 28 mL/min — ABNORMAL LOW (ref >60)
GLUCOSE: 149 mg/dL — AB (ref 65–99)
POTASSIUM: 4.2 mmol/L (ref 3.5–5.1)
SODIUM: 145 mmol/L (ref 135–145)
TOTAL PROTEIN: 6.1 g/dL — AB (ref 6.5–8.1)
Total Bilirubin: 0.7 mg/dL (ref 0.3–1.2)

## 2016-02-04 LAB — URINALYSIS, ROUTINE W REFLEX MICROSCOPIC
BILIRUBIN URINE: NEGATIVE
Glucose, UA: NEGATIVE mg/dL
HGB URINE DIPSTICK: NEGATIVE
Ketones, ur: NEGATIVE mg/dL
Leukocytes, UA: NEGATIVE
Nitrite: NEGATIVE
PH: 5 (ref 5.0–8.0)
Protein, ur: 30 mg/dL — AB
SPECIFIC GRAVITY, URINE: 1.016 (ref 1.005–1.030)

## 2016-02-04 LAB — CBG MONITORING, ED: Glucose-Capillary: 143 mg/dL — ABNORMAL HIGH (ref 65–99)

## 2016-02-04 LAB — AMMONIA: Ammonia: <9 umol/L — ABNORMAL LOW (ref 9–35)

## 2016-02-04 LAB — URINE MICROSCOPIC-ADD ON
Bacteria, UA: NONE SEEN
RBC / HPF: NONE SEEN RBC/hpf (ref 0–5)
Squamous Epithelial / LPF: NONE SEEN
WBC, UA: NONE SEEN WBC/hpf (ref 0–5)

## 2016-02-04 LAB — MAGNESIUM: MAGNESIUM: 2 mg/dL (ref 1.7–2.4)

## 2016-02-04 LAB — PHOSPHORUS: PHOSPHORUS: 3.8 mg/dL (ref 2.5–4.6)

## 2016-02-04 MED ORDER — SODIUM CHLORIDE 0.9 % IV BOLUS (SEPSIS)
500.0000 mL | Freq: Once | INTRAVENOUS | Status: AC
  Administered 2016-02-04: 500 mL via INTRAVENOUS

## 2016-02-04 MED ORDER — SODIUM CHLORIDE 0.9 % IV BOLUS (SEPSIS)
1000.0000 mL | Freq: Once | INTRAVENOUS | Status: AC
  Administered 2016-02-04: 1000 mL via INTRAVENOUS

## 2016-02-04 NOTE — H&P (Signed)
History and Physical    [name] [id number] DOB: [birth year] DOA: 02/04/2016  PCP: Cathlean Cower, MD   Patient coming from: [facility] center.  Chief Complaint: Altered mental status.  HPI: Katie Dawson is a 82 y.o. female with medical history significant of iron deficiency anemia, anxiety, dementia, type 2 diabetes, GERD, gout, hyperlipidemia, hypertension, hypersomnia, history of nephrectomy, obstructive sleep apnea, osteopenia, renal insufficiency, history of multiple UTIs in the past who was brought to the emergency department via EMS from the [facility] after the nursing staff at that facility reported that the patient was lethargic and unable to eat. The patient was responsive to pain, but did not verbally respond to EMS. Currently, she wakes up to tactile stimuli briefly and asks that the covers are not removed from her. She did not respond to verbal stimuli initially and she is unable to provide further history at this time.  ED Course: The patient received IVF in the ED. Her mental status has improved, but still very somnolent. Workup shows mild leukocytosis of 12.1 K, hemoglobin level of 10.6 g/dL and increased creatinine to 1.68 mg/dL.  Review of Systems: As per HPI otherwise 10 point review of systems negative.   Past Medical History:  Diagnosis Date  . ANEMIA-IRON DEFICIENCY 04/22/2007  . ANXIETY 04/22/2007  . COLONIC POLYPS, HX OF 04/22/2007  . Dementia   . DIABETES MELLITUS, TYPE II 04/22/2007  . FOOT PAIN, LEFT 12/23/2007  . GERD 04/22/2007  . GOUT 04/22/2007  . HYPERLIPIDEMIA 04/22/2007  . HYPERSOMNIA 01/11/2009  . HYPERTENSION 04/22/2007  . INTERMITTENT VERTIGO 04/20/2009  . NEPHRECTOMY, HX OF 04/22/2007  . OBSTRUCTIVE SLEEP APNEA 04/22/2007  . 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 OSTEOPENIA 02/12/2009  . RENAL INSUFFICIENCY 04/22/2007  . Right fibular fracture July 2013  . Urinary tract infection    hx of     Past Surgical History:  Procedure Laterality Date  . ORIF WRIST FRACTURE  01/02/2012   Procedure: OPEN REDUCTION INTERNAL FIXATION (ORIF) WRIST FRACTURE;  Surgeon: Schuyler Amor, MD;  Location: [facility];  Service: Orthopedics;  Laterality: Right;  Open Reduction Internal Fixation Right Distal Radius   . s/p parathyroid surgury    . s/p right nephrectomy       reports that she has never smoked. She has never used smokeless tobacco. She reports that she does not drink alcohol or use drugs.  No Known Allergies  Family History  Problem Relation Age of Onset  . Heart disease Mother     Prior to Admission medications   Medication Sig Start Date End Date Taking? Authorizing Provider  acetaminophen (MAPAP) 325 MG tablet TAKE 2 TABLETS(650MG ) BY MOUTH EVERY 8 HOURS. 01/10/16  Yes Biagio Borg, MD  amLODipine (NORVASC) 10 MG tablet Take 10 mg by mouth daily.   Yes Historical Provider, MD  aspirin EC 81 MG tablet Take 1 tablet (81 mg total) by mouth daily. 04/07/14  Yes Thurnell Lose, MD  atorvastatin (LIPITOR) 10 MG tablet Take 10 mg by mouth daily.   Yes Historical Provider, MD  busPIRone (BUSPAR) 7.5 MG tablet Take 7.5 mg by mouth 3 (three) times daily.    Yes Historical Provider, MD  citalopram (CELEXA) 10 MG tablet Take 10 mg by mouth daily.   Yes Historical Provider, MD  cloNIDine (CATAPRES) 0.1 MG tablet Take 0.1 mg by mouth 2 (two) times daily.   Yes Historical Provider, MD  docusate (COLACE) 50 MG/5ML liquid Take 0.1  mg by mouth every 30 (thirty) days. In each ear once monthly. Wait for 10 minutes and then rinse   Yes Historical Provider, MD  donepezil (ARICEPT) 10 MG tablet TAKE 1 TABLET BY MOUTH ONCE DAILY. 11/12/15  Yes Biagio Borg, MD  gabapentin (NEURONTIN) 300 MG capsule TAKE 1 CAPSULE BY MOUTH 3 TIMES DAILY. 11/01/15  Yes Biagio Borg, MD  glipiZIDE (GLUCOTROL XL) 5 MG 24 hr tablet Take 1 tablet (5 mg total) by mouth daily with breakfast. 02/07/14  Yes Biagio Borg, MD   hydrALAZINE (APRESOLINE) 25 MG tablet Take 1 tablet (25 mg total) by mouth every 8 (eight) hours. 04/07/14  Yes Thurnell Lose, MD  insulin glargine (LANTUS) 100 UNIT/ML injection Inject 0.6 mLs (60 Units total) into the skin at bedtime. Patient taking differently: Inject 16 Units into the skin at bedtime.  02/10/13 02/04/16 Yes Biagio Borg, MD  insulin lispro (HUMALOG) 100 UNIT/ML injection Inject 2 Units into the skin 3 (three) times daily before meals. Hold if pt not eating or sugar below 100   Yes Historical Provider, MD  isosorbide mononitrate (IMDUR) 60 MG 24 hr tablet Take 60 mg by mouth at bedtime.   Yes Historical Provider, MD  labetalol (NORMODYNE) 200 MG tablet Take 200 mg by mouth 2 (two) times daily.   Yes Historical Provider, MD  promethazine (PHENERGAN) 12.5 MG tablet Take 12.5 mg by mouth every 12 (twelve) hours as needed for nausea or vomiting.   Yes Historical Provider, MD  risperiDONE (RISPERDAL) 0.5 MG tablet Take 0.5 mg by mouth at bedtime.   Yes Historical Provider, MD  B-D INS SYRINGE 0.5CC/31GX5/16 31G X 5/16" 0.5 ML MISC USE AS DIRECTED FOR INSULIN ADMINISTRATION. 01/01/16   Biagio Borg, MD  BD PEN NEEDLE NANO U/F 32G X 4 MM MISC USE WITH LANTUS DAILY.    Biagio Borg, MD  glucose blood [facility] CONTOUR NEXT TEST) test strip Use as instructed 09/16/13   Biagio Borg, MD  hydrocortisone (ANUSOL-HC) 2.5 % rectal cream Place 1 application rectally 2 (two) times daily. Patient not taking: Reported on 02/04/2016 12/31/15   Binnie Rail, MD  Insulin Pen Needle (BD PEN NEEDLE NANO U/F) 32G X 4 MM MISC 1 Device by Does not apply route daily as needed (insulin).    Historical Provider, MD  Lancets MISC Use as directed 1 per day 09/16/13   Biagio Borg, MD  lidocaine-hydrocortisone [facility]) 3-0.5 % CREA Place 1 Applicatorful rectally 2 (two) times daily. Patient not taking: Reported on 02/04/2016 01/04/16   Biagio Borg, MD  nystatin (NYSTATIN) powder Apply topically 4 (four) times  daily. Patient not taking: Reported on 02/04/2016 11/09/15   Binnie Rail, MD  traMADol (ULTRAM) 50 MG tablet Take 1 tablet (50 mg total) by mouth every 6 (six) hours as needed. Patient not taking: Reported on 02/04/2016 08/31/15   Biagio Borg, MD    Physical Exam: Vitals:   02/04/16 2000 02/04/16 2237 02/04/16 2243 02/04/16 2305  BP: 146/62 178/66 178/66 184/76  Pulse: 74 78 73 74  Resp:  18 16 16   Temp:      TempSrc:      SpO2: 97% 97% 96% 93%      Constitutional: NAD, calm, comfortable Vitals:   02/04/16 2000 02/04/16 2237 02/04/16 2243 02/04/16 2305  BP: 146/62 178/66 178/66 184/76  Pulse: 74 78 73 74  Resp:  18 16 16   Temp:  TempSrc:      SpO2: 97% 97% 96% 93%   Eyes: PERRL, lids and conjunctivae normal ENMT: Mucous membranes are mildly dry. Posterior pharynx clear of any exudate or lesions. Neck: normal, supple, no masses, no thyromegaly Respiratory: clear to auscultation bilaterally, no wheezing, no crackles on anterior exam only. Normal respiratory effort. No accessory muscle use.  Cardiovascular: Regular rate and rhythm, positive 3/6 systolic murmur, no / rubs / gallops. No extremity edema. 2+ pedal pulses. No carotid bruits.  Abdomen: no tenderness, no masses palpated. No hepatosplenomegaly. Bowel sounds positive.  Musculoskeletal: no clubbing / cyanosis. Good ROM, no contractures. Decreased muscle tone.  Skin: Positive ecchymosis from venipunctures, otherwise no rashes, lesions, ulcers on limited skin exam. Unable to examine the dorsal torso. Neurologic: CN 2-12 grossly intact. Sensation intact, DTR normal. Strength 5/5 in all 4.  Psychiatric: Somnolent, but wakes up with tactile stimuli. Oriented to person and place. Disoriented to time and situation.    Labs on Admission: I have personally reviewed following labs and imaging studies  CBC:  Recent Labs Lab 02/04/16 2137  WBC 12.1*  HGB 10.6*  HCT 33.2*  MCV 89.0  PLT [id number]   Basic Metabolic  Panel:  Recent Labs Lab 02/04/16 2137  NA 145  K 4.2  CL 111  CO2 26  GLUCOSE 149*  BUN 31*  CREATININE 1.68*  CALCIUM 9.8   GFR: CrCl cannot be calculated (Unknown ideal weight.). Liver Function Tests:  Recent Labs Lab 02/04/16 2137  AST 17  ALT 15  ALKPHOS 55  BILITOT 0.7  PROT 6.1*  ALBUMIN 3.4*    CBG:  Recent Labs Lab 02/04/16 2034  GLUCAP 143*    Urine analysis:    Component Value Date/Time   COLORURINE YELLOW 02/04/2016 2106   APPEARANCEUR CLEAR 02/04/2016 2106   LABSPEC 1.016 02/04/2016 2106   PHURINE 5.0 02/04/2016 2106   GLUCOSEU NEGATIVE 02/04/2016 2106   GLUCOSEU NEGATIVE 02/10/2013 1024   HGBUR NEGATIVE 02/04/2016 2106   BILIRUBINUR NEGATIVE 02/04/2016 2106   [place] NEGATIVE 02/04/2016 2106   PROTEINUR 30 (A) 02/04/2016 2106   UROBILINOGEN 1.0 04/05/2014 1220   NITRITE NEGATIVE 02/04/2016 2106   LEUKOCYTESUR NEGATIVE 02/04/2016 2106     Radiological Exams on Admission: Ct Head Wo Contrast  Result Date: 02/04/2016 CLINICAL DATA:  Lethargy.  History of dementia. EXAM: CT HEAD WITHOUT CONTRAST TECHNIQUE: Contiguous axial images were obtained from the base of the skull through the vertex without intravenous contrast. COMPARISON:  September 13, 2015 FINDINGS: Brain: No subdural, epidural, or subarachnoid hemorrhage. Prominence of the ventricles and sulci is stable. The cerebellum, brainstem, and basal cisterns are normal. Moderate two severe white matter changes again identified. No acute cortical ischemia or infarct. No mass, mass effect, or midline shift. Vascular: Atherosclerotic changes seen in the intracranial portions of the carotid arteries. Skull: Normal Sinuses/Orbits: No acute finding. Other: No other abnormalities. IMPRESSION: Moderate to severe white matter changes. No acute abnormalities identified. Electronically Signed   By: Dorise Bullion III M.D   On: 02/04/2016 20:41   Echocardiogram  09/14/2015  ------------------------------------------------------------------- LV EF: 55% -   60%  ------------------------------------------------------------------- Indications:      Syncope 780.2.  ------------------------------------------------------------------- History:   PMH:   Murmur.  Risk factors:  Hypertension. Diabetes mellitus.  ------------------------------------------------------------------- Study Conclusions  - Left ventricle: The cavity size was normal. Systolic function was   normal. The estimated ejection fraction was in the range of 55%   to 60%. Wall motion was normal; there  were no regional wall   motion abnormalities. Features are consistent with a pseudonormal   left ventricular filling pattern, with concomitant abnormal   relaxation and increased filling pressure (grade 2 diastolic   dysfunction). - Aortic valve: Mildly calcified annulus. Mildly thickened, mildly   calcified leaflets. There was very mild stenosis. Valve area   (VTI): 2.87 cm^2. Valve area (Vmax): 2.29 cm^2. Valve area   (Vmean): 2.08 cm^2. - Left atrium: The atrium was mildly dilated.   Assessment/Plan Principal Problem:   Altered mental status   Encephalopathy acute Admit to telemetry/observation. Continue time-limited IV fluids. Supplemental oxygen. Check ammonia level. Neuro checks every 4 hours. Hold psychotropic or MS altering medications. (BuSpar, Celexa, gabapentin, Risperdal)  Active Problems:   Insulin dependent diabetes mellitus ([place]) Carbohydrate diet once the patient is alert. Hold Lantus tonight. CBG monitoring with regular insulin sliding scale.    ANEMIA-IRON DEFICIENCY Monitor hematocrit and hemoglobin.    Essential hypertension Continue amlodipine 10 mg by mouth daily in the morning if MS has improved. Hold clonidine tonight to avoid further drowsiness or hypotension.    GERD Protonix 40 mg by mouth daily.    Aortic stenosis Stable. Continue  periodic echocardiographic surveillance.    Leucocytosis No new source of infection at this time.  May be due to volume depletion. However, will order chest radiograph and follow CBC in a.m.    Peripheral neuropathy (HCC) Resume Neurontin 300 mg by mouth 3 times a day, once the patient's mental status is back to baseline.  Addendum:   Abnormal chest x-ray  IMPRESSION: **An incidental finding of potential clinical significance has been found. Left apical mass has increased in size, measuring 4.5 cm. Increased prominence of the left hilum. Findings suspicious for worsening metastatic disease. PET/CT would be helpful for further evaluation, as deemed clinically appropriate.**  Please order PET/CT in am if the patient's MS improves further and if renal function allows it.    DVT prophylaxis: Lovenox SQ. Code Status: Full code. Family Communication:  Disposition Plan: Admit for observation and further workup. Consults called:  Admission status: Observation/telemetry.   [name] MD [facility] Pager [phone].  If 7PM-7AM, please contact night-coverage [email or web address] Password [email or web address]  02/04/2016, 11:27 PM

## 2016-02-04 NOTE — ED Notes (Signed)
Blood draw unsuccessful x2  

## 2016-02-04 NOTE — ED Notes (Signed)
Attempted to start IV and draw blood but unsuccessful. Will have another RN attempt.

## 2016-02-04 NOTE — ED Provider Notes (Signed)
[facility] DEPT Provider Note   CSN: [id number] Arrival date & time: 02/04/16  1816     History   Chief Complaint Chief Complaint  Patient presents with  . Failure To Thrive    HPI Katie Dawson is a 82 y.o. female.  HPI  Pt comes in with cc of confusion and weakness. LEVEL 5 CAVEAT FOR ALTERED MENTAL STATUS. Pt has hx of multiple medical problems and is on several psych medications as well. She has hx of dementia and resides at a nursing home. Pt's family at bedside and reports that patient was last normal on Friday. She started getting more confused as the weekend went on. Typically pt can hold a conversation, but now she is just laying and not eating or drinking. No new meds.   Past Medical History:  Diagnosis Date  . ANEMIA-IRON DEFICIENCY 04/22/2007  . ANXIETY 04/22/2007  . COLONIC POLYPS, HX OF 04/22/2007  . Dementia   . DIABETES MELLITUS, TYPE II 04/22/2007  . FOOT PAIN, LEFT 12/23/2007  . GERD 04/22/2007  . GOUT 04/22/2007  . HYPERLIPIDEMIA 04/22/2007  . HYPERSOMNIA 01/11/2009  . HYPERTENSION 04/22/2007  . INTERMITTENT VERTIGO 04/20/2009  . NEPHRECTOMY, HX OF 04/22/2007  . OBSTRUCTIVE SLEEP APNEA 04/22/2007  . 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 OSTEOPENIA 02/12/2009  . RENAL INSUFFICIENCY 04/22/2007  . Right fibular fracture July 2013  . Urinary tract infection    hx of    Patient Active Problem List   Diagnosis Date Noted  . External hemorrhoid, thrombosed 01/04/2016  . Hematochezia 01/04/2016  . Syncope 10/07/2015  . Lethargy 09/13/2015  . Dizziness 04/05/2014  . Abnormal urine odor 02/07/2014  . UTI (urinary tract infection) 10/17/2013  . Hypoglycemia 10/16/2013  . Encephalopathy acute 10/16/2013  . Heart murmur 09/16/2013  . Agitation 09/16/2013  . Back pain 05/18/2013  . Altered mental state 05/15/2013  . Lower extremity weakness 05/15/2013  . ARF (acute renal failure) ([place]) 05/15/2013  . Leucocytosis 05/15/2013  . Peripheral  neuropathy ([place]) 05/15/2013  . Aortic stenosis 02/10/2013  . Dementia 02/10/2013  . Vitamin D deficiency 04/10/2012  . Depression 04/10/2012  . Confusion 04/08/2012  . Closed fibular fracture 12/24/2011  . Wrist fracture, closed 12/24/2011  . Fatigue 11/05/2010  . Preventative health care 11/03/2010  . INTERMITTENT VERTIGO 04/20/2009  . OSTEOPENIA 02/12/2009  . FOOT PAIN, LEFT 12/23/2007  . Insulin dependent diabetes mellitus ([place]) 04/22/2007  . HLD (hyperlipidemia) 04/22/2007  . GOUT 04/22/2007  . ANEMIA-IRON DEFICIENCY 04/22/2007  . ANXIETY 04/22/2007  . Sleep apnea 04/22/2007  . Essential hypertension 04/22/2007  . GERD 04/22/2007  . Disorder resulting from impaired renal function 04/22/2007  . 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 COLONIC POLYPS, HX OF 04/22/2007  . NEPHRECTOMY, HX OF 04/22/2007    Past Surgical History:  Procedure Laterality Date  . ORIF WRIST FRACTURE  01/02/2012   Procedure: OPEN REDUCTION INTERNAL FIXATION (ORIF) WRIST FRACTURE;  Surgeon: Schuyler Amor, MD;  Location: [facility];  Service: Orthopedics;  Laterality: Right;  Open Reduction Internal Fixation Right Distal Radius   . s/p parathyroid surgury    . s/p right nephrectomy      OB History    No data available       Home Medications    Prior to Admission medications   Medication Sig Start Date End Date Taking? Authorizing Provider  acetaminophen (MAPAP) 325 MG tablet TAKE 2 TABLETS(650MG ) BY MOUTH EVERY 8 HOURS. 01/10/16  Yes Biagio Borg, MD  amLODipine (  NORVASC) 10 MG tablet Take 10 mg by mouth daily.   Yes Historical Provider, MD  aspirin EC 81 MG tablet Take 1 tablet (81 mg total) by mouth daily. 04/07/14  Yes Thurnell Lose, MD  atorvastatin (LIPITOR) 10 MG tablet Take 10 mg by mouth daily.   Yes Historical Provider, MD  busPIRone (BUSPAR) 7.5 MG tablet Take 7.5 mg by mouth 3 (three) times daily.    Yes Historical Provider, MD  citalopram (CELEXA) 10 MG tablet Take 10 mg by  mouth daily.   Yes Historical Provider, MD  cloNIDine (CATAPRES) 0.1 MG tablet Take 0.1 mg by mouth 2 (two) times daily.   Yes Historical Provider, MD  docusate (COLACE) 50 MG/5ML liquid Take 0.1 mg by mouth every 30 (thirty) days. In each ear once monthly. Wait for 10 minutes and then rinse   Yes Historical Provider, MD  donepezil (ARICEPT) 10 MG tablet TAKE 1 TABLET BY MOUTH ONCE DAILY. 11/12/15  Yes Biagio Borg, MD  gabapentin (NEURONTIN) 300 MG capsule TAKE 1 CAPSULE BY MOUTH 3 TIMES DAILY. 11/01/15  Yes Biagio Borg, MD  glipiZIDE (GLUCOTROL XL) 5 MG 24 hr tablet Take 1 tablet (5 mg total) by mouth daily with breakfast. 02/07/14  Yes Biagio Borg, MD  hydrALAZINE (APRESOLINE) 25 MG tablet Take 1 tablet (25 mg total) by mouth every 8 (eight) hours. 04/07/14  Yes Thurnell Lose, MD  insulin glargine (LANTUS) 100 UNIT/ML injection Inject 0.6 mLs (60 Units total) into the skin at bedtime. Patient taking differently: Inject 16 Units into the skin at bedtime.  02/10/13 02/04/16 Yes Biagio Borg, MD  insulin lispro (HUMALOG) 100 UNIT/ML injection Inject 2 Units into the skin 3 (three) times daily before meals. Hold if pt not eating or sugar below 100   Yes Historical Provider, MD  isosorbide mononitrate (IMDUR) 60 MG 24 hr tablet Take 60 mg by mouth at bedtime.   Yes Historical Provider, MD  labetalol (NORMODYNE) 200 MG tablet Take 200 mg by mouth 2 (two) times daily.   Yes Historical Provider, MD  promethazine (PHENERGAN) 12.5 MG tablet Take 12.5 mg by mouth every 12 (twelve) hours as needed for nausea or vomiting.   Yes Historical Provider, MD  risperiDONE (RISPERDAL) 0.5 MG tablet Take 0.5 mg by mouth at bedtime.   Yes Historical Provider, MD  B-D INS SYRINGE 0.5CC/31GX5/16 31G X 5/16" 0.5 ML MISC USE AS DIRECTED FOR INSULIN ADMINISTRATION. 01/01/16   Biagio Borg, MD  BD PEN NEEDLE NANO U/F 32G X 4 MM MISC USE WITH LANTUS DAILY.    Biagio Borg, MD  glucose blood [facility] CONTOUR NEXT TEST) test strip Use as  instructed 09/16/13   Biagio Borg, MD  hydrocortisone (ANUSOL-HC) 2.5 % rectal cream Place 1 application rectally 2 (two) times daily. Patient not taking: Reported on 02/04/2016 12/31/15   Binnie Rail, MD  Insulin Pen Needle (BD PEN NEEDLE NANO U/F) 32G X 4 MM MISC 1 Device by Does not apply route daily as needed (insulin).    Historical Provider, MD  Lancets MISC Use as directed 1 per day 09/16/13   Biagio Borg, MD  lidocaine-hydrocortisone [facility]) 3-0.5 % CREA Place 1 Applicatorful rectally 2 (two) times daily. Patient not taking: Reported on 02/04/2016 01/04/16   Biagio Borg, MD  nystatin (NYSTATIN) powder Apply topically 4 (four) times daily. Patient not taking: Reported on 02/04/2016 11/09/15   Binnie Rail, MD  traMADol [facility]) 50  MG tablet Take 1 tablet (50 mg total) by mouth every 6 (six) hours as needed. Patient not taking: Reported on 02/04/2016 08/31/15   Biagio Borg, MD    Family History Family History  Problem Relation Age of Onset  . Heart disease Mother     Social History Social History  Substance Use Topics  . Smoking status: Never Smoker  . Smokeless tobacco: Never Used  . Alcohol use No     Allergies   Review of patient's allergies indicates no known allergies.   Review of Systems Review of Systems  Unable to perform ROS: Dementia     Physical Exam Updated Vital Signs BP 146/62   Pulse 74   Temp 98.2 F (36.8 C) (Oral)   Resp 15   SpO2 97%   Physical Exam  Constitutional: She appears well-developed.  HENT:  Head: Normocephalic and atraumatic.  Eyes: EOM are normal.  Neck: Normal range of motion. Neck supple.  Cardiovascular: Normal rate.   Pulmonary/Chest: Effort normal.  Abdominal: Bowel sounds are normal.  Neurological: No cranial nerve deficit.  SOMNOLENT, oriented to self and place  Skin: Skin is warm and dry.  Nursing note and vitals reviewed.    ED Treatments / Results  Labs (all labs ordered are listed, but only abnormal results  are displayed) Labs Reviewed  COMPREHENSIVE METABOLIC PANEL - Abnormal; Notable for the following:       Result Value   Glucose, Bld 149 (*)    BUN 31 (*)    Creatinine, Ser 1.68 (*)    Total Protein 6.1 (*)    Albumin 3.4 (*)    GFR calc non Af Amer 28 (*)    GFR calc Af Amer 32 (*)    All other components within normal limits  CBC - Abnormal; Notable for the following:    WBC 12.1 (*)    RBC 3.73 (*)    Hemoglobin 10.6 (*)    HCT 33.2 (*)    All other components within normal limits  URINALYSIS, ROUTINE W REFLEX MICROSCOPIC (NOT AT [facility]) - Abnormal; Notable for the following:    Protein, ur 30 (*)    All other components within normal limits  CBG MONITORING, ED - Abnormal; Notable for the following:    Glucose-Capillary 143 (*)    All other components within normal limits  URINE CULTURE  URINE MICROSCOPIC-ADD ON    EKG  EKG Interpretation None       Radiology Ct Head Wo Contrast  Result Date: 02/04/2016 CLINICAL DATA:  Lethargy.  History of dementia. EXAM: CT HEAD WITHOUT CONTRAST TECHNIQUE: Contiguous axial images were obtained from the base of the skull through the vertex without intravenous contrast. COMPARISON:  September 13, 2015 FINDINGS: Brain: No subdural, epidural, or subarachnoid hemorrhage. Prominence of the ventricles and sulci is stable. The cerebellum, brainstem, and basal cisterns are normal. Moderate two severe white matter changes again identified. No acute cortical ischemia or infarct. No mass, mass effect, or midline shift. Vascular: Atherosclerotic changes seen in the intracranial portions of the carotid arteries. Skull: Normal Sinuses/Orbits: No acute finding. Other: No other abnormalities. IMPRESSION: Moderate to severe white matter changes. No acute abnormalities identified. Electronically Signed   By: Dorise Bullion III M.D   On: 02/04/2016 20:41    Procedures Procedures (including critical care time)  Medications Ordered in ED Medications  sodium  chloride 0.9 % bolus 500 mL (not administered)  sodium chloride 0.9 % bolus 1,000 mL (1,000 mLs Intravenous  New Bag/Given 02/04/16 2133)     Initial Impression / Assessment and Plan / ED Course  I have reviewed the triage vital signs and the nursing notes.  Pertinent labs & imaging results that were available during my care of the patient were reviewed by me and considered in my medical decision making (see chart for details).  Clinical Course    Pt comes in with cc of confusion.  DDx includes: ICH / Stroke Infection - UTI/Pneumonia Electrolyte abnormality Drug overdose Metabolic disorders including thyroid disorders, adrenal insufficiency Toxic encephalopathy  Pt comes in with cc of confusion. nonfocal neuro exam, no new meds. VSS and WNL. Will check urine - ? Urosepsis. Other possibility is toxic encephalopathy - pt is on several meds. CT head ordered to r/o bleed.    Final Clinical Impressions(s) / ED Diagnoses   Final diagnoses:  Acute encephalopathy  AKI (acute kidney injury) [facility])    New Prescriptions New Prescriptions   No medications on file     Varney Biles, MD 02/04/16 2303

## 2016-02-04 NOTE — ED Triage Notes (Signed)
Pt BIB from [facility] from [facility]. Nursing Staff states that today the pt has been more lethargic and they could not get her to eat. Pt has a hx of dementia. Pt is responsive to pain, but did not respond verbally to EMS.

## 2016-02-05 ENCOUNTER — Encounter (HOSPITAL_COMMUNITY): Payer: Self-pay | Admitting: Oncology

## 2016-02-05 ENCOUNTER — Observation Stay (HOSPITAL_COMMUNITY): Payer: Medicare Other

## 2016-02-05 DIAGNOSIS — M109 Gout, unspecified: Secondary | ICD-10-CM | POA: Diagnosis present

## 2016-02-05 DIAGNOSIS — R627 Adult failure to thrive: Secondary | ICD-10-CM | POA: Diagnosis present

## 2016-02-05 DIAGNOSIS — I10 Essential (primary) hypertension: Secondary | ICD-10-CM | POA: Diagnosis present

## 2016-02-05 DIAGNOSIS — R4182 Altered mental status, unspecified: Secondary | ICD-10-CM | POA: Diagnosis present

## 2016-02-05 DIAGNOSIS — F0390 Unspecified dementia without behavioral disturbance: Secondary | ICD-10-CM | POA: Diagnosis not present

## 2016-02-05 DIAGNOSIS — F0280 Dementia in other diseases classified elsewhere without behavioral disturbance: Secondary | ICD-10-CM | POA: Diagnosis present

## 2016-02-05 DIAGNOSIS — G4710 Hypersomnia, unspecified: Secondary | ICD-10-CM | POA: Diagnosis present

## 2016-02-05 DIAGNOSIS — R9389 Abnormal findings on diagnostic imaging of other specified body structures: Secondary | ICD-10-CM | POA: Diagnosis present

## 2016-02-05 DIAGNOSIS — G629 Polyneuropathy, unspecified: Secondary | ICD-10-CM | POA: Diagnosis present

## 2016-02-05 DIAGNOSIS — Z23 Encounter for immunization: Secondary | ICD-10-CM | POA: Diagnosis not present

## 2016-02-05 DIAGNOSIS — R918 Other nonspecific abnormal finding of lung field: Secondary | ICD-10-CM | POA: Diagnosis present

## 2016-02-05 DIAGNOSIS — D72829 Elevated white blood cell count, unspecified: Secondary | ICD-10-CM | POA: Diagnosis present

## 2016-02-05 DIAGNOSIS — E785 Hyperlipidemia, unspecified: Secondary | ICD-10-CM | POA: Diagnosis present

## 2016-02-05 DIAGNOSIS — D509 Iron deficiency anemia, unspecified: Secondary | ICD-10-CM | POA: Diagnosis present

## 2016-02-05 DIAGNOSIS — N289 Disorder of kidney and ureter, unspecified: Secondary | ICD-10-CM | POA: Diagnosis present

## 2016-02-05 DIAGNOSIS — G9340 Encephalopathy, unspecified: Secondary | ICD-10-CM | POA: Diagnosis not present

## 2016-02-05 DIAGNOSIS — G309 Alzheimer's disease, unspecified: Secondary | ICD-10-CM | POA: Diagnosis present

## 2016-02-05 DIAGNOSIS — C7800 Secondary malignant neoplasm of unspecified lung: Secondary | ICD-10-CM | POA: Diagnosis present

## 2016-02-05 DIAGNOSIS — N179 Acute kidney failure, unspecified: Secondary | ICD-10-CM | POA: Diagnosis present

## 2016-02-05 DIAGNOSIS — I350 Nonrheumatic aortic (valve) stenosis: Secondary | ICD-10-CM | POA: Diagnosis not present

## 2016-02-05 DIAGNOSIS — R938 Abnormal findings on diagnostic imaging of other specified body structures: Secondary | ICD-10-CM | POA: Diagnosis not present

## 2016-02-05 DIAGNOSIS — K219 Gastro-esophageal reflux disease without esophagitis: Secondary | ICD-10-CM | POA: Diagnosis present

## 2016-02-05 DIAGNOSIS — R410 Disorientation, unspecified: Secondary | ICD-10-CM | POA: Diagnosis not present

## 2016-02-05 DIAGNOSIS — G4733 Obstructive sleep apnea (adult) (pediatric): Secondary | ICD-10-CM | POA: Diagnosis present

## 2016-02-05 DIAGNOSIS — Z87440 Personal history of urinary (tract) infections: Secondary | ICD-10-CM | POA: Diagnosis not present

## 2016-02-05 DIAGNOSIS — F419 Anxiety disorder, unspecified: Secondary | ICD-10-CM | POA: Diagnosis present

## 2016-02-05 DIAGNOSIS — G6289 Other specified polyneuropathies: Secondary | ICD-10-CM | POA: Diagnosis not present

## 2016-02-05 DIAGNOSIS — Z905 Acquired absence of kidney: Secondary | ICD-10-CM | POA: Diagnosis not present

## 2016-02-05 DIAGNOSIS — D649 Anemia, unspecified: Secondary | ICD-10-CM | POA: Diagnosis not present

## 2016-02-05 DIAGNOSIS — E119 Type 2 diabetes mellitus without complications: Secondary | ICD-10-CM | POA: Diagnosis present

## 2016-02-05 DIAGNOSIS — G92 Toxic encephalopathy: Secondary | ICD-10-CM | POA: Diagnosis present

## 2016-02-05 DIAGNOSIS — M8580 Other specified disorders of bone density and structure, unspecified site: Secondary | ICD-10-CM | POA: Diagnosis present

## 2016-02-05 LAB — COMPREHENSIVE METABOLIC PANEL
ALT: 15 U/L (ref 14–54)
ANION GAP: 4 — AB (ref 5–15)
AST: 21 U/L (ref 15–41)
Albumin: 2.9 g/dL — ABNORMAL LOW (ref 3.5–5.0)
Alkaline Phosphatase: 48 U/L (ref 38–126)
BILIRUBIN TOTAL: 0.7 mg/dL (ref 0.3–1.2)
BUN: 28 mg/dL — AB (ref 6–20)
CALCIUM: 9.4 mg/dL (ref 8.9–10.3)
CO2: 27 mmol/L (ref 22–32)
Chloride: 116 mmol/L — ABNORMAL HIGH (ref 101–111)
Creatinine, Ser: 1.38 mg/dL — ABNORMAL HIGH (ref 0.44–1.00)
GFR calc Af Amer: 41 mL/min — ABNORMAL LOW (ref >60)
GFR, EST NON AFRICAN AMERICAN: 35 mL/min — AB (ref >60)
Glucose, Bld: 161 mg/dL — ABNORMAL HIGH (ref 65–99)
POTASSIUM: 3.6 mmol/L (ref 3.5–5.1)
Sodium: 147 mmol/L — ABNORMAL HIGH (ref 135–145)
TOTAL PROTEIN: 5.8 g/dL — AB (ref 6.5–8.1)

## 2016-02-05 LAB — CBC WITH DIFFERENTIAL/PLATELET
Basophils Absolute: 0 10*3/uL (ref 0.0–0.1)
Basophils Relative: 0 %
Eosinophils Absolute: 0.2 10*3/uL (ref 0.0–0.7)
Eosinophils Relative: 2 %
HEMATOCRIT: 31.3 % — AB (ref 36.0–46.0)
Hemoglobin: 9.8 g/dL — ABNORMAL LOW (ref 12.0–15.0)
LYMPHS PCT: 25 %
Lymphs Abs: 2.8 10*3/uL (ref 0.7–4.0)
MCH: 28.7 pg (ref 26.0–34.0)
MCHC: 31.3 g/dL (ref 30.0–36.0)
MCV: 91.5 fL (ref 78.0–100.0)
MONO ABS: 1.1 10*3/uL — AB (ref 0.1–1.0)
MONOS PCT: 10 %
NEUTROS ABS: 7.1 10*3/uL (ref 1.7–7.7)
Neutrophils Relative %: 63 %
Platelets: 282 10*3/uL (ref 150–400)
RBC: 3.42 MIL/uL — ABNORMAL LOW (ref 3.87–5.11)
RDW: 14.6 % (ref 11.5–15.5)
WBC: 11.2 10*3/uL — ABNORMAL HIGH (ref 4.0–10.5)

## 2016-02-05 LAB — GLUCOSE, CAPILLARY
GLUCOSE-CAPILLARY: 167 mg/dL — AB (ref 65–99)
GLUCOSE-CAPILLARY: 150 mg/dL — AB (ref 65–99)
GLUCOSE-CAPILLARY: 129 mg/dL — AB (ref 65–99)
Glucose-Capillary: 128 mg/dL — ABNORMAL HIGH (ref 65–99)

## 2016-02-05 LAB — MRSA PCR SCREENING: MRSA BY PCR: NEGATIVE

## 2016-02-05 MED ORDER — INSULIN ASPART 100 UNIT/ML ~~LOC~~ SOLN
0.0000 [IU] | Freq: Three times a day (TID) | SUBCUTANEOUS | Status: DC
  Administered 2016-02-05: 2 [IU] via SUBCUTANEOUS
  Administered 2016-02-05 (×2): 1 [IU] via SUBCUTANEOUS
  Administered 2016-02-06 (×3): 2 [IU] via SUBCUTANEOUS
  Administered 2016-02-07 (×2): 1 [IU] via SUBCUTANEOUS
  Administered 2016-02-08: 2 [IU] via SUBCUTANEOUS
  Administered 2016-02-08 – 2016-02-09 (×3): 1 [IU] via SUBCUTANEOUS
  Administered 2016-02-09: 2 [IU] via SUBCUTANEOUS

## 2016-02-05 MED ORDER — ENOXAPARIN SODIUM 30 MG/0.3ML ~~LOC~~ SOLN: 30.0000 mg | SUBCUTANEOUS | Status: DC

## 2016-02-05 MED ORDER — SODIUM CHLORIDE 0.9% FLUSH
3.0000 mL | Freq: Two times a day (BID) | INTRAVENOUS | Status: DC
  Administered 2016-02-05 – 2016-02-09 (×8): 3 mL via INTRAVENOUS

## 2016-02-05 MED ORDER — ISOSORBIDE MONONITRATE ER 60 MG PO TB24
60.0000 mg | ORAL_TABLET | Freq: Every day | ORAL | Status: DC
  Administered 2016-02-05 – 2016-02-08 (×4): 60 mg via ORAL
  Filled 2016-02-05 (×4): qty 1

## 2016-02-05 MED ORDER — ENOXAPARIN SODIUM 40 MG/0.4ML ~~LOC~~ SOLN
40.0000 mg | SUBCUTANEOUS | Status: DC
  Administered 2016-02-05 – 2016-02-07 (×3): 40 mg via SUBCUTANEOUS
  Filled 2016-02-05 (×3): qty 0.4

## 2016-02-05 MED ORDER — INSULIN GLARGINE 100 UNIT/ML ~~LOC~~ SOLN
16.0000 [IU] | Freq: Every day | SUBCUTANEOUS | Status: DC
  Filled 2016-02-05: qty 0.16

## 2016-02-05 MED ORDER — ATORVASTATIN CALCIUM 10 MG PO TABS
10.0000 mg | ORAL_TABLET | Freq: Every day | ORAL | Status: DC
  Administered 2016-02-05 – 2016-02-09 (×5): 10 mg via ORAL
  Filled 2016-02-05 (×5): qty 1

## 2016-02-05 MED ORDER — DEXTROSE-NACL 5-0.45 % IV SOLN
INTRAVENOUS | Status: DC
  Administered 2016-02-05 – 2016-02-06 (×3): via INTRAVENOUS

## 2016-02-05 MED ORDER — DOCUSATE SODIUM 50 MG/5ML PO LIQD: 0.1000 mg | ORAL | Status: DC

## 2016-02-05 MED ORDER — INSULIN GLARGINE 100 UNIT/ML ~~LOC~~ SOLN
10.0000 [IU] | Freq: Every day | SUBCUTANEOUS | Status: DC
  Administered 2016-02-05 – 2016-02-08 (×4): 10 [IU] via SUBCUTANEOUS
  Filled 2016-02-05 (×5): qty 0.1

## 2016-02-05 MED ORDER — DEXTROSE-NACL 5-0.45 % IV SOLN
INTRAVENOUS | Status: AC
  Administered 2016-02-05: 01:00:00 via INTRAVENOUS

## 2016-02-05 MED ORDER — HYDRALAZINE HCL 25 MG PO TABS
25.0000 mg | ORAL_TABLET | Freq: Three times a day (TID) | ORAL | Status: DC
  Administered 2016-02-05 – 2016-02-09 (×13): 25 mg via ORAL
  Filled 2016-02-05 (×13): qty 1

## 2016-02-05 MED ORDER — GLIPIZIDE ER 5 MG PO TB24
5.0000 mg | ORAL_TABLET | Freq: Every day | ORAL | Status: DC
  Filled 2016-02-05: qty 1

## 2016-02-05 MED ORDER — PROMETHAZINE HCL 25 MG PO TABS: 12.5000 mg | ORAL_TABLET | Freq: Two times a day (BID) | ORAL | Status: DC | PRN

## 2016-02-05 MED ORDER — LABETALOL HCL 200 MG PO TABS
200.0000 mg | ORAL_TABLET | Freq: Two times a day (BID) | ORAL | Status: DC
  Administered 2016-02-05 – 2016-02-09 (×9): 200 mg via ORAL
  Filled 2016-02-05 (×9): qty 1

## 2016-02-05 MED ORDER — ASPIRIN EC 81 MG PO TBEC
81.0000 mg | DELAYED_RELEASE_TABLET | Freq: Every day | ORAL | Status: DC
  Administered 2016-02-05 – 2016-02-09 (×5): 81 mg via ORAL
  Filled 2016-02-05 (×5): qty 1

## 2016-02-05 MED ORDER — PNEUMOCOCCAL VAC POLYVALENT 25 MCG/0.5ML IJ INJ
0.5000 mL | INJECTION | INTRAMUSCULAR | Status: AC
  Administered 2016-02-06: 0.5 mL via INTRAMUSCULAR
  Filled 2016-02-05 (×2): qty 0.5

## 2016-02-05 MED ORDER — AMLODIPINE BESYLATE 10 MG PO TABS
10.0000 mg | ORAL_TABLET | Freq: Every day | ORAL | Status: DC
  Administered 2016-02-05 – 2016-02-09 (×5): 10 mg via ORAL
  Filled 2016-02-05 (×5): qty 1

## 2016-02-05 MED ORDER — CLONIDINE HCL 0.1 MG PO TABS
0.1000 mg | ORAL_TABLET | Freq: Two times a day (BID) | ORAL | Status: DC
Start: 2016-02-05 — End: 2016-02-09
  Administered 2016-02-05 – 2016-02-09 (×9): 0.1 mg via ORAL
  Filled 2016-02-05 (×9): qty 1

## 2016-02-05 MED ORDER — DONEPEZIL HCL 10 MG PO TABS
10.0000 mg | ORAL_TABLET | Freq: Every day | ORAL | Status: DC
  Administered 2016-02-05 – 2016-02-08 (×4): 10 mg via ORAL
  Filled 2016-02-05 (×6): qty 1

## 2016-02-05 NOTE — Care Management Obs Status (Signed)
[facility] NOTIFICATION   Patient Details  Name: Katie Dawson MRN: [id number] Date of Birth: [birth year]   Medicare Observation Status Notification Given:  Yes    MahabirJuliann Pulse, RN 02/05/2016, 10:41 AM

## 2016-02-05 NOTE — Care Management Note (Signed)
Case Management Note  Patient Details  Name: Katie Dawson MRN: [id number] Date of Birth: 1933-12-29  Subjective/Objective:  82 y/o f admitted w/AMS. From [facility]. Full code.MD will wait until patient more alert to order PT cons. CSW following.                 Action/Plan:d/c plan ALF.   Expected Discharge Date:                  Expected Discharge Plan:  Assisted Living / Rest Home  In-House Referral:  Clinical Social Work  Discharge planning Services  CM Consult  Post Acute Care Choice:    Choice offered to:     DME Arranged:    DME Agency:     HH Arranged:    HH Agency:     Status of Service:  In process, will continue to follow  If discussed at Long Length of Stay Meetings, dates discussed:    Additional Comments:  Dessa Phi, RN 02/05/2016, 10:41 AM

## 2016-02-05 NOTE — Progress Notes (Signed)
PROGRESS NOTE                                                                                                                                                                                                             Patient Demographics:    Katie Dawson, is a 82 y.o. female, DOB - 14-Feb-1934, [id number]  Admit date - 02/04/2016   Admitting Physician [name], MD  Outpatient Primary MD for the patient is Cathlean Cower, MD  LOS - 0  Chief Complaint  Patient presents with  . Failure To Thrive       Brief Narrative   82 year old female with history of iron deficiency anemia, anxiety, advanced dementia, type 2 diabetes, GERD, gout, hyperlipidemia, hypertension, hypersomnia, history of nephrectomy, obstructive sleep apnea, osteopenia, renal insufficiency, history of multiple UTIs in the past who was brought to the emergency department via EMS for altered mental status, CT head significant with severe atrophy, but no acute finding, patient improved after holding some medications, chest x-ray significant for left upper lung mass.   Subjective:    Hosie Poisson todayWith advanced dementia, sleepy area during the morning, but currently more awake, answers yes no questions but inappropriately.   Assessment  & Plan :    Principal Problem:   Altered mental status Active Problems:   Insulin dependent diabetes mellitus (HCC)   ANEMIA-IRON DEFICIENCY   Essential hypertension   GERD   Aortic stenosis   Leucocytosis   Peripheral neuropathy (HCC)   Encephalopathy acute   Abnormal chest x-ray   Encephalopathy  - This is most likely due to significant baseline dementia, with polypharmacy - Proving after holding psychotropic medication loading BuSpar, Celexa, gabapentin and Risperdal - Ammonia within normal limits, will check B-12 and folate level.  Lung mass on chest x-ray - This x-ray significant with enlarging left lung mass, will obtain CT  chest for further evaluation, a shunt was seen by Dr. Benay Spice in the past, will discuss with Dr. Benay Spice when CT chest is done  Diabetes mellitus - will hold glipizide, will lower her Lantus dose given poor oral intake, continue with 10 units of Lantus and insulin sliding scale  ANEMIA-IRON DEFICIENCY - Monitor hematocrit and hemoglobin.  Essential hypertension - Continue with home medication  GERD - Protonix 40 mg by mouth daily.  Aortic stenosis - Continue  periodic echocardiographic surveillance.  Peripheral neuropathy (HCC) - Resume Neurontin at a lower dose once the patient's mental status is back to baseline.   Code Status : Full  Family Communication  : Discussed with daughter  Disposition Plan  : Back to ALF when stable  Consults  :  None  Procedures  : None  DVT Prophylaxis  :  Lovenox  Lab Results  Component Value Date   PLT 282 02/05/2016    Antibiotics  :  None  Anti-infectives    None        Objective:   Vitals:   02/05/16 0000 02/05/16 0031 02/05/16 0047 02/05/16 0559  BP: 178/72  (!) 160/58 (!) 166/89  Pulse: 65     Resp: 17  18 18   Temp:   97.7 F (36.5 C) 98.5 F (36.9 C)  TempSrc:   Oral Axillary  SpO2: 93%  96% 98%  Weight:  85.7 kg (188 lb 15 oz)    Height:   5\' 5"  (1.651 m)     Wt Readings from Last 3 Encounters:  02/05/16 85.7 kg (188 lb 15 oz)  01/04/16 90.9 kg (200 lb 8 oz)  10/02/15 91.2 kg (201 lb)     Intake/Output Summary (Last 24 hours) at 02/05/16 1441 Last data filed at 02/05/16 [id number]  Gross per 24 hour  Intake           706.25 ml  Output                0 ml  Net           706.25 ml     Physical Exam  Wakes up, confused Supple Neck,No JVD,  Symmetrical Chest wall movement, Good air movement bilaterally,  RRR,No Gallops, No Parasternal Heave +ve B.Sounds, Abd Soft, No tenderness,  No Cyanosis, Clubbing or edema, No new Rash or bruise     Data Review:    CBC  Recent Labs Lab 02/04/16 2137  02/05/16 0455  WBC 12.1* 11.2*  HGB 10.6* 9.8*  HCT 33.2* 31.3*  PLT 314 282  MCV 89.0 91.5  MCH 28.4 28.7  MCHC 31.9 31.3  RDW 14.3 14.6  LYMPHSABS  --  2.8  MONOABS  --  1.1*  EOSABS  --  0.2  BASOSABS  --  0.0    Chemistries   Recent Labs Lab 02/04/16 2137 02/05/16 0455  NA 145 147*  K 4.2 3.6  CL 111 116*  CO2 26 27  GLUCOSE 149* 161*  BUN 31* 28*  CREATININE 1.68* 1.38*  CALCIUM 9.8 9.4  MG 2.0  --   AST 17 21  ALT 15 15  ALKPHOS 55 48  BILITOT 0.7 0.7   ------------------------------------------------------------------------------------------------------------------ No results for input(s): CHOL, HDL, LDLCALC, TRIG, CHOLHDL, LDLDIRECT in the last 72 hours.  Lab Results  Component Value Date   HGBA1C 6.5 01/04/2016   ------------------------------------------------------------------------------------------------------------------ No results for input(s): TSH, T4TOTAL, T3FREE, THYROIDAB in the last 72 hours.  Invalid input(s): FREET3 ------------------------------------------------------------------------------------------------------------------ No results for input(s): VITAMINB12, FOLATE, FERRITIN, TIBC, IRON, RETICCTPCT in the last 72 hours.  Coagulation profile No results for input(s): INR, PROTIME in the last 168 hours.  No results for input(s): DDIMER in the last 72 hours.  Cardiac Enzymes No results for input(s): CKMB, TROPONINI, MYOGLOBIN in the last 168 hours.  Invalid input(s): CK ------------------------------------------------------------------------------------------------------------------ No results found for: BNP  Inpatient Medications  Scheduled Meds: . amLODipine  10 mg Oral Daily  . aspirin EC  81 mg  Oral Daily  . atorvastatin  10 mg Oral q1800  . cloNIDine  0.1 mg Oral BID  . [START ON 03/02/2016] docusate  0.1 mg Both Ears Q30 days  . donepezil  10 mg Oral QHS  . enoxaparin (LOVENOX) injection  40 mg Subcutaneous Q24H  .  glipiZIDE  5 mg Oral Q breakfast  . hydrALAZINE  25 mg Oral Q8H  . insulin aspart  0-9 Units Subcutaneous TID WC  . insulin glargine  16 Units Subcutaneous QHS  . isosorbide mononitrate  60 mg Oral QHS  . labetalol  200 mg Oral BID  . [START ON 02/06/2016] pneumococcal 23 valent vaccine  0.5 mL Intramuscular Tomorrow-1000  . sodium chloride flush  3 mL Intravenous Q12H   Continuous Infusions: . dextrose 5 % and 0.45% NaCl 75 mL/hr at 02/05/16 1036   PRN Meds:.promethazine  Micro Results Recent Results (from the past 240 hour(s))  MRSA PCR Screening     Status: None   Collection Time: 02/05/16  1:03 AM  Result Value Ref Range Status   MRSA by PCR NEGATIVE NEGATIVE Final    Comment:        The GeneXpert MRSA Assay (FDA approved for NASAL specimens only), is one component of a comprehensive MRSA colonization surveillance program. It is not intended to diagnose MRSA infection nor to guide or monitor treatment for MRSA infections.     Radiology Reports Ct Head Wo Contrast  Result Date: 02/04/2016 CLINICAL DATA:  Lethargy.  History of dementia. EXAM: CT HEAD WITHOUT CONTRAST TECHNIQUE: Contiguous axial images were obtained from the base of the skull through the vertex without intravenous contrast. COMPARISON:  September 13, 2015 FINDINGS: Brain: No subdural, epidural, or subarachnoid hemorrhage. Prominence of the ventricles and sulci is stable. The cerebellum, brainstem, and basal cisterns are normal. Moderate two severe white matter changes again identified. No acute cortical ischemia or infarct. No mass, mass effect, or midline shift. Vascular: Atherosclerotic changes seen in the intracranial portions of the carotid arteries. Skull: Normal Sinuses/Orbits: No acute finding. Other: No other abnormalities. IMPRESSION: Moderate to severe white matter changes. No acute abnormalities identified. Electronically Signed   By: Dorise Bullion III M.D   On: 02/04/2016 20:41   Dg Chest Port 1  View  Result Date: 02/04/2016 CLINICAL DATA:  Acute onset of altered mental status. Failure to thrive. Initial encounter. EXAM: PORTABLE CHEST 1 VIEW COMPARISON:  Chest radiograph from 09/13/2015 FINDINGS: The left apical mass has increased in size. There is also increased prominence of the left hilum. Findings are suspicious for worsening bronchogenic malignancy and metastatic disease. Known additional bilateral pulmonary nodules are not well seen. No pleural effusion or pneumothorax is seen. The cardiomediastinal silhouette is otherwise unremarkable in appearance. No displaced rib fractures are seen. IMPRESSION: **An incidental finding of potential clinical significance has been found. Left apical mass has increased in size, measuring 4.5 cm. Increased prominence of the left hilum. Findings suspicious for worsening metastatic disease. PET/CT would be helpful for further evaluation, as deemed clinically appropriate.** Electronically Signed   By: Garald Balding M.D.   On: 02/04/2016 23:26      ELGERGAWY, DAWOOD M.D on 02/05/2016 at 2:41 PM  Between 7am to 7pm - Pager - [phone]  After 7pm go to [email or web address] - password [facility]  [facility] -  Office  [phone]

## 2016-02-05 NOTE — Progress Notes (Signed)
Unable to administer PO meds at this time. Patient is not alert enough to swallow medication. Hughie Melroy, Bing Neighbors, RN

## 2016-02-06 DIAGNOSIS — N179 Acute kidney failure, unspecified: Secondary | ICD-10-CM

## 2016-02-06 DIAGNOSIS — G9340 Encephalopathy, unspecified: Secondary | ICD-10-CM

## 2016-02-06 DIAGNOSIS — I350 Nonrheumatic aortic (valve) stenosis: Secondary | ICD-10-CM

## 2016-02-06 DIAGNOSIS — D509 Iron deficiency anemia, unspecified: Secondary | ICD-10-CM

## 2016-02-06 DIAGNOSIS — G6289 Other specified polyneuropathies: Secondary | ICD-10-CM

## 2016-02-06 DIAGNOSIS — R410 Disorientation, unspecified: Secondary | ICD-10-CM

## 2016-02-06 DIAGNOSIS — Z794 Long term (current) use of insulin: Secondary | ICD-10-CM

## 2016-02-06 DIAGNOSIS — D72829 Elevated white blood cell count, unspecified: Secondary | ICD-10-CM

## 2016-02-06 DIAGNOSIS — E119 Type 2 diabetes mellitus without complications: Secondary | ICD-10-CM

## 2016-02-06 DIAGNOSIS — R918 Other nonspecific abnormal finding of lung field: Secondary | ICD-10-CM

## 2016-02-06 DIAGNOSIS — D649 Anemia, unspecified: Secondary | ICD-10-CM

## 2016-02-06 DIAGNOSIS — F0390 Unspecified dementia without behavioral disturbance: Secondary | ICD-10-CM

## 2016-02-06 DIAGNOSIS — I10 Essential (primary) hypertension: Secondary | ICD-10-CM

## 2016-02-06 LAB — BASIC METABOLIC PANEL
Anion gap: 6 (ref 5–15)
BUN: 21 mg/dL — AB (ref 6–20)
CHLORIDE: 114 mmol/L — AB (ref 101–111)
CO2: 26 mmol/L (ref 22–32)
CREATININE: 1.17 mg/dL — AB (ref 0.44–1.00)
Calcium: 9.2 mg/dL (ref 8.9–10.3)
GFR calc Af Amer: 50 mL/min — ABNORMAL LOW (ref >60)
GFR calc non Af Amer: 43 mL/min — ABNORMAL LOW (ref >60)
GLUCOSE: 157 mg/dL — AB (ref 65–99)
POTASSIUM: 3.6 mmol/L (ref 3.5–5.1)
Sodium: 146 mmol/L — ABNORMAL HIGH (ref 135–145)

## 2016-02-06 LAB — GLUCOSE, CAPILLARY
GLUCOSE-CAPILLARY: 189 mg/dL — AB (ref 65–99)
Glucose-Capillary: 175 mg/dL — ABNORMAL HIGH (ref 65–99)
Glucose-Capillary: 197 mg/dL — ABNORMAL HIGH (ref 65–99)
Glucose-Capillary: 145 mg/dL — ABNORMAL HIGH (ref 65–99)

## 2016-02-06 LAB — URINE CULTURE: Culture: NO GROWTH

## 2016-02-06 LAB — CBC
HEMATOCRIT: 29.5 % — AB (ref 36.0–46.0)
Hemoglobin: 9 g/dL — ABNORMAL LOW (ref 12.0–15.0)
MCH: 27.5 pg (ref 26.0–34.0)
MCHC: 30.5 g/dL (ref 30.0–36.0)
MCV: 90.2 fL (ref 78.0–100.0)
PLATELETS: 258 10*3/uL (ref 150–400)
RBC: 3.27 MIL/uL — ABNORMAL LOW (ref 3.87–5.11)
RDW: 14.4 % (ref 11.5–15.5)
WBC: 8.7 10*3/uL (ref 4.0–10.5)

## 2016-02-06 LAB — VITAMIN B12: Vitamin B-12: 241 pg/mL (ref 180–914)

## 2016-02-06 LAB — FOLATE: Folate: 7.7 ng/mL (ref >5.9)

## 2016-02-06 MED ORDER — CITALOPRAM HYDROBROMIDE 20 MG PO TABS
10.0000 mg | ORAL_TABLET | Freq: Every day | ORAL | Status: DC
  Administered 2016-02-07 – 2016-02-09 (×3): 10 mg via ORAL
  Filled 2016-02-06 (×3): qty 1

## 2016-02-06 NOTE — Telephone Encounter (Signed)
Called patient 02/01/16, she states blank FMLA forms were left with provider during August visit. Unable to locate these forms. With patient permission, I called her employer's medical dept on 02/05/16 and obtained new FMLA forms for completion. Given to [facility] 02/05/16, pt aware. Please call when ready.

## 2016-02-06 NOTE — Progress Notes (Signed)
PROGRESS NOTE    Katie Dawson  [id number] DOB: November 29, 1933 DOA: 02/04/2016  PCP: Cathlean Cower, MD   Brief Narrative:  82 year old female with history of iron deficiency anemia, anxiety, advanced dementia, type 2 diabetes, GERD, gout, hyperlipidemia, hypertension, hypersomnia, history of nephrectomy, obstructive sleep apnea, osteopenia, renal insufficiency, history of multiple UTIs in the past who was brought to the emergency department via EMS for altered mental status, CT head significant with severe atrophy, but no acute finding, patient improved after holding some medications, chest x-ray significant for left upper lung mass.  Subjective: No complaints- confused.   Assessment & Plan:   Encephalopathy  - This is most likely due to significant baseline dementia, with polypharmacy - improved to baseline (per daughter who has seen her today) after holding psychotropic medication including BuSpar, Celexa, gabapentin and Risperdal - Ammonia within normal limits -  B-12 low normal - will give s/c injection - folate level normal - have resumed Celexa today  Lung mass on chest x-ray - This x-ray significant with enlarging left lung mass - CT chest reveals enlarging pulmonary nodules/ masses and mediastinal and hilar adenopathy and a subcutaneous mass in right upper chest wall- in 2015 she was noted to have a lung nodule and it was planned for he to undergo a biopsy but she never followed up for this - has h/o renal cell cancer and this may very well be a recurrence  - have discussed with Dr Ammie Dalton and daughter, [name]- Dr Ammie Dalton does not feel patient is a good candidate for chemo and I agree- daughter agrees as well and will further discuss it with her sister  Diabetes mellitus - hold glipizide, lowered Lantus dose given poor oral intake   ANEMIA-IRON DEFICIENCY - Monitor hematocrit and hemoglobin.  Essential hypertension - Continue with home medication  GERD -  Protonix 40 mg by mouth daily.  Aortic stenosis - Continue periodic echocardiographic surveillance.  Peripheral neuropathy (HCC) - Resume Neurontin at a lower dose once the patient's mental status is back to baseline.    DVT prophylaxis: Lovenox Code Status: Full code Family Communication: daughter [name] Disposition Plan: return to [place]  Consultants:   oncology Procedures:    Antimicrobials:  Anti-infectives    None       Objective: Vitals:   02/05/16 1500 02/05/16 1505 02/05/16 2045 02/06/16 0500  BP: (!) 115/42 (!) 123/30 (!) 144/47 (!) 152/64  Pulse: (!) 58  (!) 58 66  Resp: 18  (!) 22 20  Temp: 99.6 F (37.6 C)  98.5 F (36.9 C) 98.3 F (36.8 C)  TempSrc: Oral  Oral Oral  SpO2: 97%  97% 99%  Weight:      Height:        Intake/Output Summary (Last 24 hours) at 02/06/16 1333 Last data filed at 02/06/16 0900  Gross per 24 hour  Intake             1780 ml  Output                0 ml  Net             1780 ml   Filed Weights   02/05/16 0031  Weight: 85.7 kg (188 lb 15 oz)    Examination: General exam: Appears comfortable  HEENT: PERRLA, oral mucosa moist, no sclera icterus or thrush Respiratory system: Clear to auscultation. Respiratory effort normal. Cardiovascular system: S1 & S2 heard, RRR.  No murmurs  Chest: mass felt in  right upper chest wall, firm non-tender Gastrointestinal system: Abdomen soft, non-tender, nondistended. Normal bowel sound. No organomegaly Central nervous system: Alert - confused-  No focal neurological deficits. Extremities: No cyanosis, clubbing or edema Skin: No rashes or ulcers Psychiatry:  Mood & affect appropriate.     Data Reviewed: I have personally reviewed following labs and imaging studies  CBC:  Recent Labs Lab 02/04/16 2137 02/05/16 0455 02/06/16 0535  WBC 12.1* 11.2* 8.7  NEUTROABS  --  7.1  --   HGB 10.6* 9.8* 9.0*  HCT 33.2* 31.3* 29.5*  MCV 89.0 91.5 90.2  PLT 314 282 0000000   Basic  Metabolic Panel:  Recent Labs Lab 02/04/16 2137 02/05/16 0455 02/06/16 0535  NA 145 147* 146*  K 4.2 3.6 3.6  CL 111 116* 114*  CO2 26 27 26   GLUCOSE 149* 161* 157*  BUN 31* 28* 21*  CREATININE 1.68* 1.38* 1.17*  CALCIUM 9.8 9.4 9.2  MG 2.0  --   --   PHOS 3.8  --   --    GFR: Estimated Creatinine Clearance: 42.2 mL/min (by C-G formula based on SCr of 1.17 mg/dL). Liver Function Tests:  Recent Labs Lab 02/04/16 2137 02/05/16 0455  AST 17 21  ALT 15 15  ALKPHOS 55 48  BILITOT 0.7 0.7  PROT 6.1* 5.8*  ALBUMIN 3.4* 2.9*   No results for input(s): LIPASE, AMYLASE in the last 168 hours.  Recent Labs Lab 02/04/16 2301  AMMONIA <9*   Coagulation Profile: No results for input(s): INR, PROTIME in the last 168 hours. Cardiac Enzymes: No results for input(s): CKTOTAL, CKMB, CKMBINDEX, TROPONINI in the last 168 hours. BNP (last 3 results) No results for input(s): PROBNP in the last 8760 hours. HbA1C: No results for input(s): HGBA1C in the last 72 hours. CBG:  Recent Labs Lab 02/05/16 1220 02/05/16 1646 02/05/16 2113 02/06/16 0757 02/06/16 1217  GLUCAP 128* 150* 129* 175* 197*   Lipid Profile: No results for input(s): CHOL, HDL, LDLCALC, TRIG, CHOLHDL, LDLDIRECT in the last 72 hours. Thyroid Function Tests: No results for input(s): TSH, T4TOTAL, FREET4, T3FREE, THYROIDAB in the last 72 hours. Anemia Panel:  Recent Labs  02/06/16 0535  VITAMINB12 241  FOLATE 7.7   Urine analysis:    Component Value Date/Time   COLORURINE YELLOW 02/04/2016 2106   APPEARANCEUR CLEAR 02/04/2016 2106   LABSPEC 1.016 02/04/2016 2106   PHURINE 5.0 02/04/2016 2106   GLUCOSEU NEGATIVE 02/04/2016 2106   GLUCOSEU NEGATIVE 02/10/2013 1024   HGBUR NEGATIVE 02/04/2016 2106   [place] NEGATIVE 02/04/2016 2106   KETONESUR NEGATIVE 02/04/2016 2106   PROTEINUR 30 (A) 02/04/2016 2106   UROBILINOGEN 1.0 04/05/2014 1220   NITRITE NEGATIVE 02/04/2016 2106   LEUKOCYTESUR NEGATIVE  02/04/2016 2106   Sepsis Labs: @LABRCNTIP (procalcitonin:4,lacticidven:4) ) Recent Results (from the past 240 hour(s))  Urine culture     Status: None   Collection Time: 02/04/16  9:06 PM  Result Value Ref Range Status   Specimen Description URINE, RANDOM  Final   Special Requests NONE  Final   Culture NO GROWTH Performed at [facility]   Final   Report Status 02/06/2016 FINAL  Final  MRSA PCR Screening     Status: None   Collection Time: 02/05/16  1:03 AM  Result Value Ref Range Status   MRSA by PCR NEGATIVE NEGATIVE Final    Comment:        The GeneXpert MRSA Assay (FDA approved for NASAL specimens only), is one component of  a comprehensive MRSA colonization surveillance program. It is not intended to diagnose MRSA infection nor to guide or monitor treatment for MRSA infections.          Radiology Studies: Ct Head Wo Contrast  Result Date: 02/04/2016 CLINICAL DATA:  Lethargy.  History of dementia. EXAM: CT HEAD WITHOUT CONTRAST TECHNIQUE: Contiguous axial images were obtained from the base of the skull through the vertex without intravenous contrast. COMPARISON:  September 13, 2015 FINDINGS: Brain: No subdural, epidural, or subarachnoid hemorrhage. Prominence of the ventricles and sulci is stable. The cerebellum, brainstem, and basal cisterns are normal. Moderate two severe white matter changes again identified. No acute cortical ischemia or infarct. No mass, mass effect, or midline shift. Vascular: Atherosclerotic changes seen in the intracranial portions of the carotid arteries. Skull: Normal Sinuses/Orbits: No acute finding. Other: No other abnormalities. IMPRESSION: Moderate to severe white matter changes. No acute abnormalities identified. Electronically Signed   By: Dorise Bullion III M.D   On: 02/04/2016 20:41   Ct Chest Wo Contrast  Result Date: 02/05/2016 CLINICAL DATA:  Left upper lobe enlarging lung mass. EXAM: CT CHEST WITHOUT CONTRAST TECHNIQUE:  Multidetector CT imaging of the chest was performed following the standard protocol without IV contrast. COMPARISON:  02/04/2016 and PET-CT 11/09/2013 FINDINGS: Cardiovascular: Aortoiliac atherosclerotic vascular disease. Mediastinum/Nodes: AP window lymph node 3.4 cm in short axis, image 44/3, new compared to prior PET-CT. Left hilar and infrahilar adenopathy is suspected as well. Lungs/Pleura: Left upper lobe mass 3.7 by 2.8 cm on image 20/7, formerly 1.7 by 1.4 cm on 11/09/2013. New satellite nodules observed. Other bilateral pulmonary nodules are present including a 3 point 1 by 2.0 cm mass which previously measured 1.3 by 1.2 cm. A new nodule posteriorly in the left upper lobe measures 1.1 by 0.7 cm. Multiple scattered additional pulmonary nodules are present. Trace bilateral pleural effusions. Upper Abdomen: Right nephrectomy. Left adrenal mass, 3.2 by 3.6 cm, internal density 34 Hounsfield units, previously 3.4 by 3.2 cm. Vascular calcification of the left renal hilum. Multiple small hyperdense lesions of the left kidney, probably complex cysts but technically nonspecific. Musculoskeletal: Subcutaneous mass along the right upper chest anterior to the pectoralis muscle, 2.1 by 1.2 cm on image 24/3, not present on the prior PET-CT. Lucency in the right proximal humerus possibly related to prior fracture. Thoracic spondylosis. Old left lateral rib fractures. IMPRESSION: 1. New and enlarging pulmonary nodules/pulmonary masses, compatible with progressive metastatic disease. 2. Stable appearance of the left adrenal mass, possibly an adenoma but technically nonspecific based on density. 3. Pathologic mediastinal and hilar adenopathy including a 3.4 cm in short axis AP window lymph node which was not present on prior PET-CT of 11/09/2013. 4. Subcutaneous mass likely a metastatic lesion in the right upper chest anterior to the pectoralis muscle. 5. Suspected small complex cysts of the left kidney. Electronically  Signed   By: Van Clines M.D.   On: 02/05/2016 19:43   Dg Chest Port 1 View  Result Date: 02/04/2016 CLINICAL DATA:  Acute onset of altered mental status. Failure to thrive. Initial encounter. EXAM: PORTABLE CHEST 1 VIEW COMPARISON:  Chest radiograph from 09/13/2015 FINDINGS: The left apical mass has increased in size. There is also increased prominence of the left hilum. Findings are suspicious for worsening bronchogenic malignancy and metastatic disease. Known additional bilateral pulmonary nodules are not well seen. No pleural effusion or pneumothorax is seen. The cardiomediastinal silhouette is otherwise unremarkable in appearance. No displaced rib fractures are seen. IMPRESSION: **An incidental  finding of potential clinical significance has been found. Left apical mass has increased in size, measuring 4.5 cm. Increased prominence of the left hilum. Findings suspicious for worsening metastatic disease. PET/CT would be helpful for further evaluation, as deemed clinically appropriate.** Electronically Signed   By: Garald Balding M.D.   On: 02/04/2016 23:26      Scheduled Meds: . amLODipine  10 mg Oral Daily  . aspirin EC  81 mg Oral Daily  . atorvastatin  10 mg Oral q1800  . citalopram  10 mg Oral Daily  . cloNIDine  0.1 mg Oral BID  . [START ON 03/02/2016] docusate  0.1 mg Both Ears Q30 days  . donepezil  10 mg Oral QHS  . enoxaparin (LOVENOX) injection  40 mg Subcutaneous Q24H  . hydrALAZINE  25 mg Oral Q8H  . insulin aspart  0-9 Units Subcutaneous TID WC  . insulin glargine  10 Units Subcutaneous QHS  . isosorbide mononitrate  60 mg Oral QHS  . labetalol  200 mg Oral BID  . pneumococcal 23 valent vaccine  0.5 mL Intramuscular Tomorrow-1000  . sodium chloride flush  3 mL Intravenous Q12H   Continuous Infusions: . dextrose 5 % and 0.45% NaCl 75 mL/hr at 02/06/16 1222     LOS: 1 day    Time spent in minutes: 76 min    Navaeh Kehres, MD [facility] Pager: [email or web address] Password [email or web address] 02/06/2016, 1:33 PM

## 2016-02-06 NOTE — Progress Notes (Addendum)
IP PROGRESS NOTE  Subjective:   Katie Dawson is known to me from an evaluation in 2015 when she was noted to have lung nodules, a left adrenal mass, and a left renal mass. She was lost to follow-up. Katie Dawson was admitted on 02/04/2016 with altered mental status. A chest x-ray revealed a left apical mass and increased prominence of the left hilum. She was referred for a CT of the chest on 02/05/2016 compared to a PET CT from June 2015 the left upper lobe mass has increased in size. There are new satellite nodules. Other pulmonary nodules have increased in size and there are new lesions. A left adrenal mass has not changed significantly. A new subcutaneous mass is noted at the right chest wall near the pectoralis muscle. There is pathologic mediastinal adenopathy. Objective: Vital signs in last 24 hours: Blood pressure (!) 152/64, pulse 66, temperature 98.3 F (36.8 C), temperature source Oral, resp. rate 20, height 5\' 5"  (1.651 m), weight 188 lb 15 oz (85.7 kg), SpO2 99 %.  Intake/Output from previous day: 09/05 0701 - 09/06 0700 In: 965 [P.O.:35; I.V.:930] Out: -   Physical Exam:  HEENT: Neck without mass Lungs: Distant breath sounds, no respiratory distress Cardiac: Regular rate and rhythm, 2/6 systolic murmur Abdomen: No hepatosplenomegaly, no mass Extremities: No leg edema Lymph nodes: No cervical, supraclavicular, left axillary, or inguinal nodes. Firm 1-2 cm medial right axillary node. Musculoskeletal: 2 cm firm mass at the right upper anterior chest wall Neurologic: Alert, follows some simple commands, not oriented to place, year, or situation.    Lab Results:  Recent Labs  02/05/16 0455 02/06/16 0535  WBC 11.2* 8.7  HGB 9.8* 9.0*  HCT 31.3* 29.5*  PLT 282 258    BMET  Recent Labs  02/05/16 0455 02/06/16 0535  NA 147* 146*  K 3.6 3.6  CL 116* 114*  CO2 27 26  GLUCOSE 161* 157*  BUN 28* 21*  CREATININE 1.38* 1.17*  CALCIUM 9.4 9.2    Studies/Results: Ct  Head Wo Contrast  Result Date: 02/04/2016 CLINICAL DATA:  Lethargy.  History of dementia. EXAM: CT HEAD WITHOUT CONTRAST TECHNIQUE: Contiguous axial images were obtained from the base of the skull through the vertex without intravenous contrast. COMPARISON:  September 13, 2015 FINDINGS: Brain: No subdural, epidural, or subarachnoid hemorrhage. Prominence of the ventricles and sulci is stable. The cerebellum, brainstem, and basal cisterns are normal. Moderate two severe white matter changes again identified. No acute cortical ischemia or infarct. No mass, mass effect, or midline shift. Vascular: Atherosclerotic changes seen in the intracranial portions of the carotid arteries. Skull: Normal Sinuses/Orbits: No acute finding. Other: No other abnormalities. IMPRESSION: Moderate to severe white matter changes. No acute abnormalities identified. Electronically Signed   By: Dorise Bullion III M.D   On: 02/04/2016 20:41   Ct Chest Wo Contrast  Result Date: 02/05/2016 CLINICAL DATA:  Left upper lobe enlarging lung mass. EXAM: CT CHEST WITHOUT CONTRAST TECHNIQUE: Multidetector CT imaging of the chest was performed following the standard protocol without IV contrast. COMPARISON:  02/04/2016 and PET-CT 11/09/2013 FINDINGS: Cardiovascular: Aortoiliac atherosclerotic vascular disease. Mediastinum/Nodes: AP window lymph node 3.4 cm in short axis, image 44/3, new compared to prior PET-CT. Left hilar and infrahilar adenopathy is suspected as well. Lungs/Pleura: Left upper lobe mass 3.7 by 2.8 cm on image 20/7, formerly 1.7 by 1.4 cm on 11/09/2013. New satellite nodules observed. Other bilateral pulmonary nodules are present including a 3 point 1 by 2.0 cm mass which  previously measured 1.3 by 1.2 cm. A new nodule posteriorly in the left upper lobe measures 1.1 by 0.7 cm. Multiple scattered additional pulmonary nodules are present. Trace bilateral pleural effusions. Upper Abdomen: Right nephrectomy. Left adrenal mass, 3.2 by 3.6 cm,  internal density 34 Hounsfield units, previously 3.4 by 3.2 cm. Vascular calcification of the left renal hilum. Multiple small hyperdense lesions of the left kidney, probably complex cysts but technically nonspecific. Musculoskeletal: Subcutaneous mass along the right upper chest anterior to the pectoralis muscle, 2.1 by 1.2 cm on image 24/3, not present on the prior PET-CT. Lucency in the right proximal humerus possibly related to prior fracture. Thoracic spondylosis. Old left lateral rib fractures. IMPRESSION: 1. New and enlarging pulmonary nodules/pulmonary masses, compatible with progressive metastatic disease. 2. Stable appearance of the left adrenal mass, possibly an adenoma but technically nonspecific based on density. 3. Pathologic mediastinal and hilar adenopathy including a 3.4 cm in short axis AP window lymph node which was not present on prior PET-CT of 11/09/2013. 4. Subcutaneous mass likely a metastatic lesion in the right upper chest anterior to the pectoralis muscle. 5. Suspected small complex cysts of the left kidney. Electronically Signed   By: Van Clines M.D.   On: 02/05/2016 19:43   Dg Chest Port 1 View  Result Date: 02/04/2016 CLINICAL DATA:  Acute onset of altered mental status. Failure to thrive. Initial encounter. EXAM: PORTABLE CHEST 1 VIEW COMPARISON:  Chest radiograph from 09/13/2015 FINDINGS: The left apical mass has increased in size. There is also increased prominence of the left hilum. Findings are suspicious for worsening bronchogenic malignancy and metastatic disease. Known additional bilateral pulmonary nodules are not well seen. No pleural effusion or pneumothorax is seen. The cardiomediastinal silhouette is otherwise unremarkable in appearance. No displaced rib fractures are seen. IMPRESSION: **An incidental finding of potential clinical significance has been found. Left apical mass has increased in size, measuring 4.5 cm. Increased prominence of the left hilum.  Findings suspicious for worsening metastatic disease. PET/CT would be helpful for further evaluation, as deemed clinically appropriate.** Electronically Signed   By: Garald Balding M.D.   On: 02/04/2016 23:26    Medications: I have reviewed the patient's current medications.  Assessment/Plan: 1. Bilateral lung masses, chest lymphadenopathy, and a right chest wall mass on CT 02/05/2016-progressive compared to a PET CT from 2015 2. history of a right nephrectomy-? For treatment of a malignancy  3. Dementia 5. Diabetes  6. Urinary tract infection May 2015 7. Anemia   Katie Dawson appears to have severe dementia and multiple comorbid conditions. There are progressive bilateral lung masses, chest lymphadenopathy, and a new mass at the right anterior chest wall compared to when I saw her greater than 2 years ago. I suspect these findings are related to a metastatic carcinoma, most likely renal cell carcinoma. The differential diagnosis includes lung cancer and other primary tumor sites.  Katie Dawson is unable to comprehend these findings and make her own decisions. Her family is not present this morning. The chest wall mass appears accessible for a biopsy. However I do not recommend a biopsy since she does not appear to be a candidate for systemic therapy. I will be glad to discuss the current situation and recommendations with her family.  Dr. Jenny Reichmann is her primary physician and can help with decision making.  Recommendations: 1. [facility] hospice referral 2.  Consider consolidating medical regimen to focus on comfort care 3. Consult Dr. Jenny Reichmann to get his opinion regarding appropriate level of  care   LOS: 1 day   Katie Coder, MD   02/06/2016, 7:09 AM  I discussed the case with her daughter by telephone. I explained the progression of the chest disease noted on the CT yesterday. The daughter agrees that Katie Dawson has severe dementia. I recommended hospice care. The family would like to proceed with  a diagnostic biopsy prior to deciding on a disposition plan.

## 2016-02-07 LAB — GLUCOSE, CAPILLARY
GLUCOSE-CAPILLARY: 122 mg/dL — AB (ref 65–99)
GLUCOSE-CAPILLARY: 140 mg/dL — AB (ref 65–99)
Glucose-Capillary: 118 mg/dL — ABNORMAL HIGH (ref 65–99)
Glucose-Capillary: 140 mg/dL — ABNORMAL HIGH (ref 65–99)

## 2016-02-07 LAB — PROTIME-INR
INR: 1.08
Prothrombin Time: 14.1 seconds (ref 11.4–15.2)

## 2016-02-07 MED ORDER — GABAPENTIN 300 MG PO CAPS
300.0000 mg | ORAL_CAPSULE | Freq: Three times a day (TID) | ORAL | Status: DC
  Administered 2016-02-07 – 2016-02-09 (×7): 300 mg via ORAL
  Filled 2016-02-07 (×7): qty 1

## 2016-02-07 NOTE — Progress Notes (Signed)
PROGRESS NOTE    Katie Dawson  [id number] DOB: 07/07/1933 DOA: 02/04/2016  PCP: Cathlean Cower, MD   Brief Narrative:  82 year old female with history of iron deficiency anemia, anxiety, advanced dementia, type 2 diabetes, GERD, gout, hyperlipidemia, hypertension, hypersomnia, history of nephrectomy, obstructive sleep apnea, osteopenia, renal insufficiency, history of multiple UTIs in the past who was brought to the emergency department via EMS for altered mental status, CT head significant with severe atrophy, but no acute finding, patient improved after holding some medications, chest x-ray significant for left upper lung mass.  Subjective: No complaints- confused.   Assessment & Plan:   Encephalopathy  - This is most likely due to significant baseline dementia, with polypharmacy - improved to baseline (per daughter who has seen her in the hospital) after holding psychotropic medication including BuSpar, Celexa, gabapentin and Risperdal - Ammonia within normal limits -  B-12 low normal - will give s/c injection - folate level normal - have resumed Celexa  - resume Gabapentin today  Lung mass on chest x-ray - This x-ray significant with enlarging left lung mass - CT chest reveals enlarging pulmonary nodules/ masses and mediastinal and hilar adenopathy and a subcutaneous mass in right upper chest wall- in 2015 she was noted to have a lung nodule and it was planned for he to undergo a biopsy but she never followed up for this - has h/o renal cell cancer and this may very well be a recurrence  - have discussed with Dr Ammie Dalton and daughter, [name] not feel patient is a good candidate for chemo and I agree- family also in agreement, however, they would like biopsy for a diagnosis and for determining family history- Dr Ammie Dalton has ordered this  Diabetes mellitus - hold glipizide, lowered Lantus dose given poor oral intake   ANEMIA-IRON DEFICIENCY - Monitor  hematocrit and hemoglobin.  Essential hypertension - Continue with home medication  GERD - Protonix 40 mg by mouth daily.  Aortic stenosis - Continue periodic echocardiographic surveillance.  Peripheral neuropathy (HCC) - Resume Neurontin at a lower dose once the patient's mental status is back to baseline.    DVT prophylaxis: Lovenox Code Status: Full code Family Communication: daughter [name] Disposition Plan: return to AL  Consultants:   oncology Procedures:    Antimicrobials:  Anti-infectives    None       Objective: Vitals:   02/06/16 1530 02/06/16 2058 02/07/16 0512 02/07/16 0951  BP: (!) 144/55 (!) 155/58 (!) 145/52 (!) 144/69  Pulse: 64 (!) 56 64 61  Resp: 20 18 18    Temp: 98.2 F (36.8 C) 98.6 F (37 C) 98.3 F (36.8 C)   TempSrc: Oral Oral Oral   SpO2: 99% 98% 99%   Weight:      Height:        Intake/Output Summary (Last 24 hours) at 02/07/16 1235 Last data filed at 02/07/16 1210  Gross per 24 hour  Intake              360 ml  Output                0 ml  Net              360 ml   Filed Weights   02/05/16 0031  Weight: 85.7 kg (188 lb 15 oz)    Examination: General exam: Appears comfortable  HEENT: PERRLA, oral mucosa moist, no sclera icterus or thrush Respiratory system: Clear to auscultation. Respiratory effort normal.  Cardiovascular system: S1 & S2 heard, RRR.  No murmurs  Chest: mass felt in right upper chest wall, firm non-tender Gastrointestinal system: Abdomen soft, non-tender, nondistended. Normal bowel sound. No organomegaly Central nervous system: Alert - confused-  No focal neurological deficits. Extremities: No cyanosis, clubbing or edema Skin: No rashes or ulcers Psychiatry:  Mood & affect appropriate.     Data Reviewed: I have personally reviewed following labs and imaging studies  CBC:  Recent Labs Lab 02/04/16 2137 02/05/16 0455 02/06/16 0535  WBC 12.1* 11.2* 8.7  NEUTROABS  --  7.1  --   HGB  10.6* 9.8* 9.0*  HCT 33.2* 31.3* 29.5*  MCV 89.0 91.5 90.2  PLT 314 282 0000000   Basic Metabolic Panel:  Recent Labs Lab 02/04/16 2137 02/05/16 0455 02/06/16 0535  NA 145 147* 146*  K 4.2 3.6 3.6  CL 111 116* 114*  CO2 26 27 26   GLUCOSE 149* 161* 157*  BUN 31* 28* 21*  CREATININE 1.68* 1.38* 1.17*  CALCIUM 9.8 9.4 9.2  MG 2.0  --   --   PHOS 3.8  --   --    GFR: Estimated Creatinine Clearance: 42.2 mL/min (by C-G formula based on SCr of 1.17 mg/dL). Liver Function Tests:  Recent Labs Lab 02/04/16 2137 02/05/16 0455  AST 17 21  ALT 15 15  ALKPHOS 55 48  BILITOT 0.7 0.7  PROT 6.1* 5.8*  ALBUMIN 3.4* 2.9*   No results for input(s): LIPASE, AMYLASE in the last 168 hours.  Recent Labs Lab 02/04/16 2301  AMMONIA <9*   Coagulation Profile:  Recent Labs Lab 02/07/16 0518  INR 1.08   Cardiac Enzymes: No results for input(s): CKTOTAL, CKMB, CKMBINDEX, TROPONINI in the last 168 hours. BNP (last 3 results) No results for input(s): PROBNP in the last 8760 hours. HbA1C: No results for input(s): HGBA1C in the last 72 hours. CBG:  Recent Labs Lab 02/06/16 1217 02/06/16 1657 02/06/16 2109 02/07/16 0728 02/07/16 1135  GLUCAP 197* 189* 145* 122* 140*   Lipid Profile: No results for input(s): CHOL, HDL, LDLCALC, TRIG, CHOLHDL, LDLDIRECT in the last 72 hours. Thyroid Function Tests: No results for input(s): TSH, T4TOTAL, FREET4, T3FREE, THYROIDAB in the last 72 hours. Anemia Panel:  Recent Labs  02/06/16 0535  VITAMINB12 241  FOLATE 7.7   Urine analysis:    Component Value Date/Time   COLORURINE YELLOW 02/04/2016 2106   APPEARANCEUR CLEAR 02/04/2016 2106   LABSPEC 1.016 02/04/2016 2106   PHURINE 5.0 02/04/2016 2106   GLUCOSEU NEGATIVE 02/04/2016 2106   GLUCOSEU NEGATIVE 02/10/2013 1024   HGBUR NEGATIVE 02/04/2016 2106   [place] NEGATIVE 02/04/2016 2106   [place] NEGATIVE 02/04/2016 2106   PROTEINUR 30 (A) 02/04/2016 2106   UROBILINOGEN 1.0  04/05/2014 1220   NITRITE NEGATIVE 02/04/2016 2106   LEUKOCYTESUR NEGATIVE 02/04/2016 2106   Sepsis Labs: @LABRCNTIP (procalcitonin:4,lacticidven:4) ) Recent Results (from the past 240 hour(s))  Urine culture     Status: None   Collection Time: 02/04/16  9:06 PM  Result Value Ref Range Status   Specimen Description URINE, RANDOM  Final   Special Requests NONE  Final   Culture NO GROWTH Performed at [facility]   Final   Report Status 02/06/2016 FINAL  Final  MRSA PCR Screening     Status: None   Collection Time: 02/05/16  1:03 AM  Result Value Ref Range Status   MRSA by PCR NEGATIVE NEGATIVE Final    Comment:  The GeneXpert MRSA Assay (FDA approved for NASAL specimens only), is one component of a comprehensive MRSA colonization surveillance program. It is not intended to diagnose MRSA infection nor to guide or monitor treatment for MRSA infections.          Radiology Studies: Ct Chest Wo Contrast  Result Date: 02/05/2016 CLINICAL DATA:  Left upper lobe enlarging lung mass. EXAM: CT CHEST WITHOUT CONTRAST TECHNIQUE: Multidetector CT imaging of the chest was performed following the standard protocol without IV contrast. COMPARISON:  02/04/2016 and PET-CT 11/09/2013 FINDINGS: Cardiovascular: Aortoiliac atherosclerotic vascular disease. Mediastinum/Nodes: AP window lymph node 3.4 cm in short axis, image 44/3, new compared to prior PET-CT. Left hilar and infrahilar adenopathy is suspected as well. Lungs/Pleura: Left upper lobe mass 3.7 by 2.8 cm on image 20/7, formerly 1.7 by 1.4 cm on 11/09/2013. New satellite nodules observed. Other bilateral pulmonary nodules are present including a 3 point 1 by 2.0 cm mass which previously measured 1.3 by 1.2 cm. A new nodule posteriorly in the left upper lobe measures 1.1 by 0.7 cm. Multiple scattered additional pulmonary nodules are present. Trace bilateral pleural effusions. Upper Abdomen: Right nephrectomy. Left adrenal mass,  3.2 by 3.6 cm, internal density 34 Hounsfield units, previously 3.4 by 3.2 cm. Vascular calcification of the left renal hilum. Multiple small hyperdense lesions of the left kidney, probably complex cysts but technically nonspecific. Musculoskeletal: Subcutaneous mass along the right upper chest anterior to the pectoralis muscle, 2.1 by 1.2 cm on image 24/3, not present on the prior PET-CT. Lucency in the right proximal humerus possibly related to prior fracture. Thoracic spondylosis. Old left lateral rib fractures. IMPRESSION: 1. New and enlarging pulmonary nodules/pulmonary masses, compatible with progressive metastatic disease. 2. Stable appearance of the left adrenal mass, possibly an adenoma but technically nonspecific based on density. 3. Pathologic mediastinal and hilar adenopathy including a 3.4 cm in short axis AP window lymph node which was not present on prior PET-CT of 11/09/2013. 4. Subcutaneous mass likely a metastatic lesion in the right upper chest anterior to the pectoralis muscle. 5. Suspected small complex cysts of the left kidney. Electronically Signed   By: Van Clines M.D.   On: 02/05/2016 19:43      Scheduled Meds: . amLODipine  10 mg Oral Daily  . aspirin EC  81 mg Oral Daily  . atorvastatin  10 mg Oral q1800  . citalopram  10 mg Oral Daily  . cloNIDine  0.1 mg Oral BID  . [START ON 03/02/2016] docusate  0.1 mg Both Ears Q30 days  . donepezil  10 mg Oral QHS  . enoxaparin (LOVENOX) injection  40 mg Subcutaneous Q24H  . hydrALAZINE  25 mg Oral Q8H  . insulin aspart  0-9 Units Subcutaneous TID WC  . insulin glargine  10 Units Subcutaneous QHS  . isosorbide mononitrate  60 mg Oral QHS  . labetalol  200 mg Oral BID  . sodium chloride flush  3 mL Intravenous Q12H   Continuous Infusions:     LOS: 2 days    Time spent in minutes: 62 min    Winston Misner, MD [facility] Pager: [email or web address] Password [facility] 02/07/2016, 12:35 PM

## 2016-02-07 NOTE — Progress Notes (Signed)
Patient ID: Katie Dawson, female   DOB: 1934-05-10, 82 y.o.   MRN: [id number] Request received for image guided biopsy on patient. Imaging studies were reviewed by Dr. Earleen Newport. Tentatively scheduled for right chest wall mass biopsy via ultrasound guidance and local anesthesia only on 9/8. Patient received Lovenox today and therefore biopsy has been rescheduled for tomorrow. Details/risks of procedure, including but not limited to, internal bleeding, infection discussed with patient's family with their understanding and consent.

## 2016-02-07 NOTE — Care Management Note (Signed)
Case Management Note  Patient Details  Name: Katie Dawson MRN: [id number] Date of Birth: [birth year]  Subjective/Objective: Patient from [place] [facility].Active w/[facility]-HH RN, HH OT.                   Action/Plan:d/c plan home.   Expected Discharge Date:                  Expected Discharge Plan:  [place]  In-House Referral:  Clinical Social Work  Discharge planning Services  CM Consult  Post Acute Care Choice:  Home Health (Active w/[facility], OT) Choice offered to:     DME Arranged:    DME Agency:     HH Arranged:    [place] Agency:     Status of Service:  In process, will continue to follow  If discussed at Long Length of Stay Meetings, dates discussed:    Additional Comments:  Dessa Phi, RN 02/07/2016, 11:39 AM

## 2016-02-07 NOTE — Care Management Note (Signed)
Case Management Note  Patient Details  Name: [name] MRN: [id number] Date of Birth: 09-17-1933  Subjective/Objective:  Per [facility] rep-[place]-active w/HH Nsg, & HHOT.Per attending will have patient to ambulate in halls. If HHPT needed can arrange w/HHPT order.Patient from [facility].              Action/Plan:d/c home w/HHC.   Expected Discharge Date:                  Expected Discharge Plan:  Assisted Living / Rest Home  In-House Referral:  Clinical Social Work  Discharge planning Services  CM Consult  Post Acute Care Choice:  Home Health (Active w/[facility] HHRN, OT) Choice offered to:     DME Arranged:    DME Agency:     HH Arranged:    [place] Agency:     Status of Service:  In process, will continue to follow  If discussed at Long Length of Stay Meetings, dates discussed:    Additional Comments:  Dessa Phi, RN 02/07/2016, 11:34 AM

## 2016-02-07 NOTE — Evaluation (Signed)
Physical Therapy Evaluation Patient Details Name: Katie Dawson MRN: [id number] DOB: [birth year] Today's Date: 02/07/2016   History of Present Illness  82 year old female with history of iron deficiency anemia, anxiety, advanced dementia, type 2 diabetes, GERD, gout, hyperlipidemia, hypertension, hypersomnia, history of nephrectomy, obstructive sleep apnea, osteopenia, renal insufficiency, history of multiple UTIs and admitted with encephalopathy most likely due to significant baseline dementia, with polypharmacy   Clinical Impression  Pt admitted with above diagnosis. Pt currently with functional limitations due to the deficits listed below (see PT Problem List).  Pt will benefit from skilled PT to increase their independence and safety with mobility to allow discharge to the venue listed below.  Pt requiring total assist for bed mobility at this time.  Pt poor historian, baseline mobility uncertain however pt will likely need ST-SNF upon d/c.     Follow Up Recommendations SNF;Supervision/Assistance - 24 hour    Equipment Recommendations  Wheelchair cushion (measurements PT);Wheelchair (measurements PT)    Recommendations for Other Services       Precautions / Restrictions Precautions Precautions: Fall      Mobility  Bed Mobility Overal bed mobility: Needs Assistance;+2 for physical assistance Bed Mobility: Supine to Sit;Sit to Supine     Supine to sit: Total assist;+2 for physical assistance Sit to supine: Total assist;+2 for physical assistance   General bed mobility comments: pt provided with multimodal cues and time however not physically assisting  Transfers                    Ambulation/Gait                Stairs            Wheelchair Mobility    Modified Rankin (Stroke Patients Only)       Balance Overall balance assessment: Needs assistance Sitting-balance support: No upper extremity supported;Feet supported Sitting balance-Leahy  Scale: Poor Sitting balance - Comments: pt cued to use hand rail for more support however would not maintain grasp (?dementia), min/guard for trunk once positioned upright                                     Pertinent Vitals/Pain Pain Assessment: Faces Faces Pain Scale: Hurts little more Pain Location: reports feet pain (hx neuropathy) Pain Intervention(s): Monitored during session;Repositioned    Home Living       Type of Home: Independent living facility           Additional Comments: pt from [facility] per chart    Prior Function Level of Independence: Needs assistance   Gait / Transfers Assistance Needed: Per Katie Dawson, pt with decreasing mobility x several months. Some days she can ambulate to the dining room with assist and her walker and sometimes requires w/c use.  ADL's / Homemaking Assistance Needed: Dependent in bathing, dressing, incontinent. Pt able to self feed and perform some grooming.  Comments: above prior function from previous admission approx 4 months ago, pt poor historian     Hand Dominance        Extremity/Trunk Assessment   Upper Extremity Assessment: Generalized weakness           Lower Extremity Assessment: Generalized weakness         Communication   Communication: HOH  Cognition Arousal/Alertness: Awake/alert Behavior During Therapy: WFL for tasks assessed/performed Overall Cognitive Status: History of cognitive impairments - at baseline (hx  of dementia, back to baseline per MD notes)                      General Comments      Exercises        Assessment/Plan    PT Assessment Patient needs continued PT services  PT Diagnosis Difficulty walking;Generalized weakness   PT Problem List Decreased strength;Decreased activity tolerance;Decreased mobility;Decreased knowledge of use of DME;Decreased balance;Decreased cognition  PT Treatment Interventions DME instruction;Gait  training;Functional mobility training;Balance training;Therapeutic exercise;Therapeutic activities;Patient/family education   PT Goals (Current goals can be found in the Care Plan section) Acute Rehab PT Goals PT Goal Formulation: Patient unable to participate in goal setting Time For Goal Achievement: 02/21/16 Potential to Achieve Goals: Fair    Frequency Min 2X/week   Barriers to discharge        Co-evaluation               End of Session   Activity Tolerance: Other (comment) (limited by cognition) Patient left: with call bell/phone within reach;with bed alarm set;in bed           Time: [id number] PT Time Calculation (min) (ACUTE ONLY): 16 min   Charges:   PT Evaluation $PT Eval Moderate Complexity: 1 Procedure     PT G Codes:        Katie Dawson,Katie Dawson 02/07/2016, 4:37 PM Carmelia Bake, PT, DPT 02/07/2016 Pager: [phone]

## 2016-02-08 ENCOUNTER — Inpatient Hospital Stay (HOSPITAL_COMMUNITY): Payer: Medicare Other

## 2016-02-08 DIAGNOSIS — R938 Abnormal findings on diagnostic imaging of other specified body structures: Secondary | ICD-10-CM

## 2016-02-08 DIAGNOSIS — C7800 Secondary malignant neoplasm of unspecified lung: Secondary | ICD-10-CM

## 2016-02-08 LAB — GLUCOSE, CAPILLARY
GLUCOSE-CAPILLARY: 156 mg/dL — AB (ref 65–99)
GLUCOSE-CAPILLARY: 179 mg/dL — AB (ref 65–99)
Glucose-Capillary: 114 mg/dL — ABNORMAL HIGH (ref 65–99)
Glucose-Capillary: 138 mg/dL — ABNORMAL HIGH (ref 65–99)

## 2016-02-08 MED ORDER — INSULIN GLARGINE 100 UNIT/ML ~~LOC~~ SOLN: 10.0000 [IU] | Freq: Every day | SUBCUTANEOUS | 11 refills | Status: DC

## 2016-02-08 NOTE — NC FL2 (Signed)
[place] LEVEL OF CARE SCREENING TOOL     IDENTIFICATION  Patient Name: Katie Dawson Birthdate: 1933-12-18 Sex: female Admission Date (Current Location): 02/04/2016  [facility] and [place] Number:  Herbalist and Address:  [facility],  [place] [address] [place]      Provider Number: [id number]  Attending Physician Name and Address:  Debbe Odea, MD  Relative Name and Phone Number:  [name], daughter, [phone]    Current Level of Care: Hospital Recommended Level of Care: ALF Prior Approval Number:    Date Approved/Denied:   PASRR Number:    Discharge Plan: ALF    Current Diagnoses: Patient Active Problem List   Diagnosis Date Noted  . Metastatic cancer to lung ([place]) 02/08/2016  . Acute encephalopathy   . AKI (acute kidney injury) ([place])   . External hemorrhoid, thrombosed 01/04/2016  . Hematochezia 01/04/2016  . Syncope 10/07/2015  . Lethargy 09/13/2015  . Dizziness 04/05/2014  . Abnormal urine odor 02/07/2014  . UTI (urinary tract infection) 10/17/2013  . Hypoglycemia 10/16/2013  . Heart murmur 09/16/2013  . Agitation 09/16/2013  . Back pain 05/18/2013  . Altered mental state 05/15/2013  . Lower extremity weakness 05/15/2013  . ARF (acute renal failure) ([place]) 05/15/2013  . Leucocytosis 05/15/2013  . Peripheral neuropathy ([place]) 05/15/2013  . Aortic stenosis 02/10/2013  . Dementia 02/10/2013  . Vitamin D deficiency 04/10/2012  . Depression 04/10/2012  . Confusion 04/08/2012  . Closed fibular fracture 12/24/2011  . Wrist fracture, closed 12/24/2011  . Fatigue 11/05/2010  . Preventative health care 11/03/2010  . INTERMITTENT VERTIGO 04/20/2009  . OSTEOPENIA 02/12/2009  . FOOT PAIN, LEFT 12/23/2007  . Insulin dependent diabetes mellitus ([place]) 04/22/2007  . HLD (hyperlipidemia) 04/22/2007  . GOUT 04/22/2007  . ANEMIA-IRON DEFICIENCY 04/22/2007  . ANXIETY 04/22/2007  . Sleep apnea 04/22/2007  . Essential  hypertension 04/22/2007  . GERD 04/22/2007  . Disorder resulting from impaired renal function 04/22/2007  . 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 COLONIC POLYPS, HX OF 04/22/2007  . NEPHRECTOMY, HX OF 04/22/2007    Orientation RESPIRATION BLADDER Height & Weight     Self, Place  Normal Incontinent Weight: 188 lb 15 oz (85.7 kg) Height:  5\' 5"  (165.1 cm)  BEHAVIORAL SYMPTOMS/MOOD NEUROLOGICAL BOWEL NUTRITION STATUS      Incontinent Diet (Dys 1 Diet)  AMBULATORY STATUS COMMUNICATION OF NEEDS Skin   Extensive Assist Verbally Normal                       Personal Care Assistance Level of Assistance  Bathing, Feeding, Dressing Bathing Assistance: Limited assistance Feeding assistance: Limited assistance Dressing Assistance: Limited assistance     Functional Limitations Info             SPECIAL CARE FACTORS FREQUENCY  PT (By licensed PT), OT (By licensed OT)             Contractures      Additional Factors Info  Code Status, Allergies Code Status Info: Fullcode Allergies Info: NKDA           Discharge Medications: TAKE these medications   acetaminophen 325 MG tablet Commonly known as:  MAPAP TAKE 2 TABLETS(650MG ) BY MOUTH EVERY 8 HOURS.  amLODipine 10 MG tablet Commonly known as:  NORVASC Take 10 mg by mouth daily.  aspirin EC 81 MG tablet Take 1 tablet (81 mg total) by mouth daily.  atorvastatin 10 MG tablet Commonly known as:  LIPITOR Take 10 mg by mouth daily.  B-D INS SYRINGE 0.5CC/31GX5/16 31G X 5/16" 0.5 ML Misc Generic drug:  Insulin Syringe-Needle U-100 USE AS DIRECTED FOR INSULIN ADMINISTRATION.  BD PEN NEEDLE NANO U/F 32G X 4 MM Misc Generic drug:  Insulin Pen Needle 1 Device by Does not apply route daily as needed (insulin).  BD PEN NEEDLE NANO U/F 32G X 4 MM Misc Generic drug:  Insulin Pen Needle USE WITH LANTUS DAILY.  citalopram 10 MG tablet Commonly known as:  CELEXA Take 10 mg by mouth daily.  cloNIDine 0.1 MG  tablet Commonly known as:  CATAPRES Take 0.1 mg by mouth 2 (two) times daily.  docusate 50 MG/5ML liquid Commonly known as:  COLACE Take 0.1 mg by mouth every 30 (thirty) days. In each ear once monthly. Wait for 10 minutes and then rinse  donepezil 10 MG tablet Commonly known as:  ARICEPT TAKE 1 TABLET BY MOUTH ONCE DAILY.  gabapentin 300 MG capsule Commonly known as:  NEURONTIN TAKE 1 CAPSULE BY MOUTH 3 TIMES DAILY.  glucose blood test strip Commonly known as:  BAYER CONTOUR NEXT TEST Use as instructed  hydrALAZINE 25 MG tablet Commonly known as:  APRESOLINE Take 1 tablet (25 mg total) by mouth every 8 (eight) hours.  insulin glargine 100 UNIT/ML injection Commonly known as:  LANTUS Inject 0.1 mLs (10 Units total) into the skin at bedtime. What changed:  how much to take  isosorbide mononitrate 60 MG 24 hr tablet Commonly known as:  IMDUR Take 60 mg by mouth at bedtime.  labetalol 200 MG tablet Commonly known as:  NORMODYNE Take 200 mg by mouth 2 (two) times daily.  Lancets Misc Use as directed 1 per day  promethazine 12.5 MG tablet Commonly known as:  PHENERGAN Take 12.5 mg by mouth every 12 (twelve) hours as needed for nausea or vomiting.     Relevant Imaging Results:  Relevant Lab Results:   Additional Information SSN: [id number]  Standley Brooking, LCSW

## 2016-02-08 NOTE — Progress Notes (Signed)
CSW faxed discharge summary & FL2 with medications to [place] at [facility], awaiting call back to see if they are able to take patient back. Patient's daughters are very adamant that they want her to return to ALF, rather than go to SNF. CSW to complete FL2 and send out to SNFs as backup.    Raynaldo Opitz, [facility] Clinical Social Worker cell #: [phone]

## 2016-02-08 NOTE — Discharge Summary (Signed)
Physician Discharge Summary  Katie Dawson [id number] DOB: 1933/08/17 DOA: 02/04/2016  PCP: Katie Cower, MD  Admit date: 02/04/2016 Discharge date: 02/08/2016  Admitted From: ALF Disposition:  ALF   Recommendations for Outpatient Follow-up:  1. Recommend palliative care consult for failure to thrive and metastatic cancer 2. PCP or Katie Dawson to discuss results of biopsy with patients family  Home Health:  PT, OT, RN     Discharge Condition:  stable   CODE STATUS:  Full code   Diet recommendation:  Regular diet Consultations:  Oncology    Discharge Diagnoses:  Principal Problem:   Acute encephalopathy Active Problems:   AKI (acute kidney injury) ([place])   Metastatic cancer to lung ([place])   Insulin dependent diabetes mellitus ([place])   ANEMIA-IRON DEFICIENCY   Essential hypertension   GERD   Aortic stenosis   Leucocytosis   Peripheral neuropathy ([place])   Brief Summary: 82 year old female with history of iron deficiency anemia, anxiety, advanced dementia, type 2 diabetes, GERD, gout, hyperlipidemia, hypertension, hypersomnia, history of nephrectomy, obstructive sleep apnea, osteopenia, renal insufficiency, history of multiple UTIs in the past who was brought to the emergency department via EMS for altered mental status, CT head significant with severe atrophy, but no acute finding, patient improved after holding some medications, chest x-ray significant for left upper lung mass.  Hospital Course:  Acute Encephalopathy  - This is most likely due to significant baseline dementia, with polypharmacy - improved to baseline (per daughter who has seen her in the hospital) after holding psychotropic medication including BuSpar, Celexa, gabapentin and Risperdal - Ammonia within normal limits -  B-12 low normal - will give s/c injection - folate level normal - have resumed Celexa and Gabapentin - hold off on Buspar and Risperdal- no agitation or anxiety noted during the hospital  stay  Lung mass on chest x-ray- metastatic cancer to lungs - CXR reveals enlarging left lung mass - CT chest revealsenlarging pulmonary nodules, lung masses, mediastinal and hilar adenopathy and a subcutaneous mass in right upper chest wall - of note, in 2015 she was noted to have a lung nodule and it was planned for her to undergo a biopsy but she never followed up for this - has h/o renal cell cancer and this may very well be a recurrence  - have discussed with Katie Dawson and daughter, Katie Dawson- Katie Dawson not feel patient is a good candidate for chemo and I agree- family also in agreement, however, after the family has spoken with Katie Dawson, it has been decided that she undergo a biopsy for a diagnosis and for determining family history - This was ordered by Katie Dawson and has been performed today - PCP or Katie Dawson will need to discuss findings with patient and family once results available  Failure to thrive - oral intake quite poor- per family, she has not been eating or interacting much and has steadily declined over the past month or so - if oral intake does not improve, recommend a palliative care consult  Diabetes mellitus - hold glipizide, lowered Lantus dose given poor oral intake   ANEMIA-IRON DEFICIENCY - Monitor hematocrit and hemoglobin.  Essential hypertension - Continue with home medication  GERD - Protonix 40 mg by mouth daily.  Aortic stenosis  Peripheral neuropathy (HCC) - feet and ankles very tender to the touch- Resumed Neurontin     Discharge Instructions  Discharge Instructions    Discharge instructions    Complete by:  As directed  Regular diet   Increase activity slowly    Complete by:  As directed       Medication List    STOP taking these medications   busPIRone 7.5 MG tablet Commonly known as:  BUSPAR   glipiZIDE 5 MG 24 hr tablet Commonly known as:  GLUCOTROL XL   insulin lispro 100 UNIT/ML injection Commonly  known as:  HUMALOG   risperiDONE 0.5 MG tablet Commonly known as:  RISPERDAL     TAKE these medications   acetaminophen 325 MG tablet Commonly known as:  MAPAP TAKE 2 TABLETS(650MG ) BY MOUTH EVERY 8 HOURS.   amLODipine 10 MG tablet Commonly known as:  NORVASC Take 10 mg by mouth daily.   aspirin EC 81 MG tablet Take 1 tablet (81 mg total) by mouth daily.   atorvastatin 10 MG tablet Commonly known as:  LIPITOR Take 10 mg by mouth daily.   B-D INS SYRINGE 0.5CC/31GX5/16 31G X 5/16" 0.5 ML Misc Generic drug:  Insulin Syringe-Needle U-100 USE AS DIRECTED FOR INSULIN ADMINISTRATION.   BD PEN NEEDLE NANO U/F 32G X 4 MM Misc Generic drug:  Insulin Pen Needle 1 Device by Does not apply route daily as needed (insulin).   BD PEN NEEDLE NANO U/F 32G X 4 MM Misc Generic drug:  Insulin Pen Needle USE WITH LANTUS DAILY.   citalopram 10 MG tablet Commonly known as:  CELEXA Take 10 mg by mouth daily.   cloNIDine 0.1 MG tablet Commonly known as:  CATAPRES Take 0.1 mg by mouth 2 (two) times daily.   docusate 50 MG/5ML liquid Commonly known as:  COLACE Take 0.1 mg by mouth every 30 (thirty) days. In each ear once monthly. Wait for 10 minutes and then rinse   donepezil 10 MG tablet Commonly known as:  ARICEPT TAKE 1 TABLET BY MOUTH ONCE DAILY.   gabapentin 300 MG capsule Commonly known as:  NEURONTIN TAKE 1 CAPSULE BY MOUTH 3 TIMES DAILY.   glucose blood test strip Commonly known as:  BAYER CONTOUR NEXT TEST Use as instructed   hydrALAZINE 25 MG tablet Commonly known as:  APRESOLINE Take 1 tablet (25 mg total) by mouth every 8 (eight) hours.   insulin glargine 100 UNIT/ML injection Commonly known as:  LANTUS Inject 0.1 mLs (10 Units total) into the skin at bedtime. What changed:  how much to take   isosorbide mononitrate 60 MG 24 hr tablet Commonly known as:  IMDUR Take 60 mg by mouth at bedtime.   labetalol 200 MG tablet Commonly known as:  NORMODYNE Take 200 mg  by mouth 2 (two) times daily.   Lancets Misc Use as directed 1 per day   promethazine 12.5 MG tablet Commonly known as:  PHENERGAN Take 12.5 mg by mouth every 12 (twelve) hours as needed for nausea or vomiting.       No Known Allergies   Procedures/Studies:  Ct Head Wo Contrast  Result Date: 02/04/2016 CLINICAL DATA:  Lethargy.  History of dementia. EXAM: CT HEAD WITHOUT CONTRAST TECHNIQUE: Contiguous axial images were obtained from the base of the skull through the vertex without intravenous contrast. COMPARISON:  September 13, 2015 FINDINGS: Brain: No subdural, epidural, or subarachnoid hemorrhage. Prominence of the ventricles and sulci is stable. The cerebellum, brainstem, and basal cisterns are normal. Moderate two severe white matter changes again identified. No acute cortical ischemia or infarct. No mass, mass effect, or midline shift. Vascular: Atherosclerotic changes seen in the intracranial portions of the carotid arteries. Skull: Normal  Sinuses/Orbits: No acute finding. Other: No other abnormalities. IMPRESSION: Moderate to severe white matter changes. No acute abnormalities identified. Electronically Signed   By: Dorise Bullion III M.D   On: 02/04/2016 20:41   Ct Chest Wo Contrast  Result Date: 02/05/2016 CLINICAL DATA:  Left upper lobe enlarging lung mass. EXAM: CT CHEST WITHOUT CONTRAST TECHNIQUE: Multidetector CT imaging of the chest was performed following the standard protocol without IV contrast. COMPARISON:  02/04/2016 and PET-CT 11/09/2013 FINDINGS: Cardiovascular: Aortoiliac atherosclerotic vascular disease. Mediastinum/Nodes: AP window lymph node 3.4 cm in short axis, image 44/3, new compared to prior PET-CT. Left hilar and infrahilar adenopathy is suspected as well. Lungs/Pleura: Left upper lobe mass 3.7 by 2.8 cm on image 20/7, formerly 1.7 by 1.4 cm on 11/09/2013. New satellite nodules observed. Other bilateral pulmonary nodules are present including a 3 point 1 by 2.0 cm  mass which previously measured 1.3 by 1.2 cm. A new nodule posteriorly in the left upper lobe measures 1.1 by 0.7 cm. Multiple scattered additional pulmonary nodules are present. Trace bilateral pleural effusions. Upper Abdomen: Right nephrectomy. Left adrenal mass, 3.2 by 3.6 cm, internal density 34 Hounsfield units, previously 3.4 by 3.2 cm. Vascular calcification of the left renal hilum. Multiple small hyperdense lesions of the left kidney, probably complex cysts but technically nonspecific. Musculoskeletal: Subcutaneous mass along the right upper chest anterior to the pectoralis muscle, 2.1 by 1.2 cm on image 24/3, not present on the prior PET-CT. Lucency in the right proximal humerus possibly related to prior fracture. Thoracic spondylosis. Old left lateral rib fractures. IMPRESSION: 1. New and enlarging pulmonary nodules/pulmonary masses, compatible with progressive metastatic disease. 2. Stable appearance of the left adrenal mass, possibly an adenoma but technically nonspecific based on density. 3. Pathologic mediastinal and hilar adenopathy including a 3.4 cm in short axis AP window lymph node which was not present on prior PET-CT of 11/09/2013. 4. Subcutaneous mass likely a metastatic lesion in the right upper chest anterior to the pectoralis muscle. 5. Suspected small complex cysts of the left kidney. Electronically Signed   By: Van Clines M.D.   On: 02/05/2016 19:43   Dg Chest Port 1 View  Result Date: 02/04/2016 CLINICAL DATA:  Acute onset of altered mental status. Failure to thrive. Initial encounter. EXAM: PORTABLE CHEST 1 VIEW COMPARISON:  Chest radiograph from 09/13/2015 FINDINGS: The left apical mass has increased in size. There is also increased prominence of the left hilum. Findings are suspicious for worsening bronchogenic malignancy and metastatic disease. Known additional bilateral pulmonary nodules are not well seen. No pleural effusion or pneumothorax is seen. The cardiomediastinal  silhouette is otherwise unremarkable in appearance. No displaced rib fractures are seen. IMPRESSION: **An incidental finding of potential clinical significance has been found. Left apical mass has increased in size, measuring 4.5 cm. Increased prominence of the left hilum. Findings suspicious for worsening metastatic disease. PET/CT would be helpful for further evaluation, as deemed clinically appropriate.** Electronically Signed   By: Garald Balding M.D.   On: 02/04/2016 23:26       Discharge Exam: Vitals:   02/08/16 0453 02/08/16 0800  BP: (!) 124/59 (!) 138/51  Pulse: 61 (!) 57  Resp: 18 16  Temp: 98.3 F (36.8 C) 98.5 F (36.9 C)   Vitals:   02/07/16 1455 02/07/16 2052 02/08/16 0453 02/08/16 0800  BP: (!) 141/52 (!) 137/59 (!) 124/59 (!) 138/51  Pulse: 60 64 61 (!) 57  Resp:  16 18 16   Temp:  99.7 F (37.6  C) 98.3 F (36.8 C) 98.5 F (36.9 C)  TempSrc:  Oral Oral Oral  SpO2:  100% 97% 100%  Weight:      Height:        General: Pt is alert, awake, not in acute distress Cardiovascular: RRR, S1/S2 +, no rubs, no gallops Respiratory: CTA bilaterally, no wheezing, no rhonchi Abdominal: Soft, NT, ND, bowel sounds + Extremities: no edema, no cyanosis    The results of significant diagnostics from this hospitalization (including imaging, microbiology, ancillary and laboratory) are listed below for reference.     Microbiology: Recent Results (from the past 240 hour(s))  Urine culture     Status: None   Collection Time: 02/04/16  9:06 PM  Result Value Ref Range Status   Specimen Description URINE, RANDOM  Final   Special Requests NONE  Final   Culture NO GROWTH Performed at [facility]   Final   Report Status 02/06/2016 FINAL  Final  MRSA PCR Screening     Status: None   Collection Time: 02/05/16  1:03 AM  Result Value Ref Range Status   MRSA by PCR NEGATIVE NEGATIVE Final    Comment:        The GeneXpert MRSA Assay (FDA approved for NASAL specimens only),  is one component of a comprehensive MRSA colonization surveillance program. It is not intended to diagnose MRSA infection nor to guide or monitor treatment for MRSA infections.      Labs: BNP (last 3 results) No results for input(s): BNP in the last 8760 hours. Basic Metabolic Panel:  Recent Labs Lab 02/04/16 2137 02/05/16 0455 02/06/16 0535  NA 145 147* 146*  K 4.2 3.6 3.6  CL 111 116* 114*  CO2 26 27 26   GLUCOSE 149* 161* 157*  BUN 31* 28* 21*  CREATININE 1.68* 1.38* 1.17*  CALCIUM 9.8 9.4 9.2  MG 2.0  --   --   PHOS 3.8  --   --    Liver Function Tests:  Recent Labs Lab 02/04/16 2137 02/05/16 0455  AST 17 21  ALT 15 15  ALKPHOS 55 48  BILITOT 0.7 0.7  PROT 6.1* 5.8*  ALBUMIN 3.4* 2.9*   No results for input(s): LIPASE, AMYLASE in the last 168 hours.  Recent Labs Lab 02/04/16 2301  AMMONIA <9*   CBC:  Recent Labs Lab 02/04/16 2137 02/05/16 0455 02/06/16 0535  WBC 12.1* 11.2* 8.7  NEUTROABS  --  7.1  --   HGB 10.6* 9.8* 9.0*  HCT 33.2* 31.3* 29.5*  MCV 89.0 91.5 90.2  PLT 314 282 258   Cardiac Enzymes: No results for input(s): CKTOTAL, CKMB, CKMBINDEX, TROPONINI in the last 168 hours. BNP: Invalid input(s): POCBNP CBG:  Recent Labs Lab 02/07/16 1135 02/07/16 1627 02/07/16 2106 02/08/16 0752 02/08/16 1138  GLUCAP 140* 118* 140* 114* 138*   D-Dimer No results for input(s): DDIMER in the last 72 hours. Hgb A1c No results for input(s): HGBA1C in the last 72 hours. Lipid Profile No results for input(s): CHOL, HDL, LDLCALC, TRIG, CHOLHDL, LDLDIRECT in the last 72 hours. Thyroid function studies No results for input(s): TSH, T4TOTAL, T3FREE, THYROIDAB in the last 72 hours.  Invalid input(s): FREET3 Anemia work up  Recent Labs  02/06/16 0535  VITAMINB12 241  FOLATE 7.7   Urinalysis    Component Value Date/Time   COLORURINE YELLOW 02/04/2016 2106   APPEARANCEUR CLEAR 02/04/2016 2106   LABSPEC 1.016 02/04/2016 2106    PHURINE 5.0 02/04/2016 2106   GLUCOSEU  NEGATIVE 02/04/2016 2106   GLUCOSEU NEGATIVE 02/10/2013 1024   HGBUR NEGATIVE 02/04/2016 2106   BILIRUBINUR NEGATIVE 02/04/2016 2106   [place] NEGATIVE 02/04/2016 2106   PROTEINUR 30 (A) 02/04/2016 2106   UROBILINOGEN 1.0 04/05/2014 1220   NITRITE NEGATIVE 02/04/2016 2106   LEUKOCYTESUR NEGATIVE 02/04/2016 2106   Sepsis Labs Invalid input(s): PROCALCITONIN,  WBC,  LACTICIDVEN Microbiology Recent Results (from the past 240 hour(s))  Urine culture     Status: None   Collection Time: 02/04/16  9:06 PM  Result Value Ref Range Status   Specimen Description URINE, RANDOM  Final   Special Requests NONE  Final   Culture NO GROWTH Performed at [facility]   Final   Report Status 02/06/2016 FINAL  Final  MRSA PCR Screening     Status: None   Collection Time: 02/05/16  1:03 AM  Result Value Ref Range Status   MRSA by PCR NEGATIVE NEGATIVE Final    Comment:        The GeneXpert MRSA Assay (FDA approved for NASAL specimens only), is one component of a comprehensive MRSA colonization surveillance program. It is not intended to diagnose MRSA infection nor to guide or monitor treatment for MRSA infections.      Time coordinating discharge: Over 30 minutes  SIGNED:   Debbe Odea, MD  [facility] 02/08/2016, 1:31 PM Pager   If 7PM-7AM, please contact night-coverage [email or web address] Password [email or web address]

## 2016-02-08 NOTE — Care Management Note (Signed)
Case Management Note  Patient Details  Name: Katie Dawson MRN: [id number] Date of Birth: 1934/05/09  Subjective/Objective: PT recc SNF. CSW following if able to return back to ALF.If d/c plan to return back to ALF-Please fax [place] OT to [facility]-fax#[phone].                   Action/Plan:d/c plan SNF/ALF   Expected Discharge Date:                  Expected Discharge Plan:  [place]  In-House Referral:  Clinical Social Work  Discharge planning Services  CM Consult  Post Acute Care Choice:  Home Health (Active w/[facility], OT) Choice offered to:     DME Arranged:    DME Agency:     HH Arranged:    [place] Agency:     Status of Service:  In process, will continue to follow  If discussed at Long Length of Stay Meetings, dates discussed:    Additional Comments:  Dessa Phi, RN 02/08/2016, 4:13 PM

## 2016-02-08 NOTE — Progress Notes (Signed)
Patient discharged by Dr. Wynelle Cleveland, Patients daughter and family notified of orders to discharge after biopsy was completed. Family refusing for patient to be discharged tonight after procedure. Dr. Wynelle Cleveland notified multiple times of family's request to stay, Dr. Wynelle Cleveland adamant that the patient is medically stable and okay to discharge after biopsy. Kelly from [place] notified of situation, Lawler from [place] awaiting call back from ALF. Notified by kelly from [facility] that ALF had accepted patient for either tonight or tomorrow. Family notified of ALF acceptance, this RN told family that medicare may not pay for tonight's stay since the patient was discharged and accepted at an ALF. Patients Daughter stated "that's fine, we don't care. She can stay here tonight." [facility] notified of situation and advised to contact CM. CM on call notified and will look into situation and follow up.

## 2016-02-08 NOTE — Progress Notes (Signed)
CSW spoke with patients daughter, [name], regarding patients return to ALF. [facility] is expecting them to discharge on today 9/08. Patients daughter stated "I already told the doctor and nurse and other social worker that we are not leaving today. I don't care how much it cost but we aren't leaving today." CSW explained to patients daughter that the discharge summary was already available which could cause complications with insurance paying for an additional night. Patients daughter stated "I don't care how much we have to pay but we aren't leaving". CSW informed RN and CM.   Kingsley Spittle, [place] Clinical Social Worker [phone]

## 2016-02-08 NOTE — Procedures (Signed)
Interventional Radiology Procedure Note  Procedure: US guided biopsy right chest wall mass  Complications: None  Estimated Blood Loss: 0  Recommendations: - path pending - Bandage removed in 24 hrs  Signed,  Criselda Peaches, MD

## 2016-02-08 NOTE — NC FL2 (Signed)
[place] LEVEL OF CARE SCREENING TOOL     IDENTIFICATION  Patient Name: Katie Dawson Birthdate: 1933-06-05 Sex: female Admission Date (Current Location): 02/04/2016  [facility] and [place] Number:  Herbalist and Address:  [facility],  [place] [address], [place]      Provider Number: [id number]  Attending Physician Name and Address:  Debbe Odea, MD  Relative Name and Phone Number:  [name], daughter, [phone]    Current Level of Care: Hospital Recommended Level of Care: [place] Prior Approval Number:    Date Approved/Denied:   PASRR Number: Description  Discharge Plan: SNF    Current Diagnoses: Patient Active Problem List   Diagnosis Date Noted  . Metastatic cancer to lung ([place]) 02/08/2016  . Acute encephalopathy   . AKI (acute kidney injury) ([place])   . External hemorrhoid, thrombosed 01/04/2016  . Hematochezia 01/04/2016  . Syncope 10/07/2015  . Lethargy 09/13/2015  . Dizziness 04/05/2014  . Abnormal urine odor 02/07/2014  . UTI (urinary tract infection) 10/17/2013  . Hypoglycemia 10/16/2013  . Heart murmur 09/16/2013  . Agitation 09/16/2013  . Back pain 05/18/2013  . Altered mental state 05/15/2013  . Lower extremity weakness 05/15/2013  . ARF (acute renal failure) ([place]) 05/15/2013  . Leucocytosis 05/15/2013  . Peripheral neuropathy ([place]) 05/15/2013  . Aortic stenosis 02/10/2013  . Dementia 02/10/2013  . Vitamin D deficiency 04/10/2012  . Depression 04/10/2012  . Confusion 04/08/2012  . Closed fibular fracture 12/24/2011  . Wrist fracture, closed 12/24/2011  . Fatigue 11/05/2010  . Preventative health care 11/03/2010  . INTERMITTENT VERTIGO 04/20/2009  . OSTEOPENIA 02/12/2009  . FOOT PAIN, LEFT 12/23/2007  . Insulin dependent diabetes mellitus ([place]) 04/22/2007  . HLD (hyperlipidemia) 04/22/2007  . GOUT 04/22/2007  . ANEMIA-IRON DEFICIENCY 04/22/2007  . ANXIETY 04/22/2007  . Sleep  apnea 04/22/2007  . Essential hypertension 04/22/2007  . GERD 04/22/2007  . Disorder resulting from impaired renal function 04/22/2007  . 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 COLONIC POLYPS, HX OF 04/22/2007  . NEPHRECTOMY, HX OF 04/22/2007    Orientation RESPIRATION BLADDER Height & Weight     Self, Place  Normal Incontinent Weight: 188 lb 15 oz (85.7 kg) Height:  5\' 5"  (165.1 cm)  BEHAVIORAL SYMPTOMS/MOOD NEUROLOGICAL BOWEL NUTRITION STATUS      Incontinent Diet (DYS1)  AMBULATORY STATUS COMMUNICATION OF NEEDS Skin   Total Care Verbally Normal                       Personal Care Assistance Level of Assistance  Total care Bathing Assistance: Maximum assistance Feeding assistance: Maximum assistance Dressing Assistance: Maximum assistance Total Care Assistance: Maximum assistance   Functional Limitations Info             SPECIAL CARE FACTORS FREQUENCY  PT (By licensed PT), OT (By licensed OT)     PT Frequency: 5 OT Frequency: 5            Contractures      Additional Factors Info  Code Status Code Status Info: Full Code Allergies Info: NKA           Current Medications (02/08/2016):  This is the current hospital active medication list Current Facility-Administered Medications  Medication Dose Route Frequency Provider Last Rate Last Dose  . amLODipine (NORVASC) tablet 10 mg  10 mg Oral Daily [name], MD   10 mg at 02/08/16 1026  . aspirin EC tablet  81 mg  81 mg Oral Daily [name], MD   81 mg at 02/08/16 1548  . atorvastatin (LIPITOR) tablet 10 mg  10 mg Oral q1800 [name], MD   10 mg at 02/08/16 1700  . citalopram (CELEXA) tablet 10 mg  10 mg Oral Daily Debbe Odea, MD   10 mg at 02/08/16 1026  . cloNIDine (CATAPRES) tablet 0.1 mg  0.1 mg Oral BID [name], MD   0.1 mg at 02/08/16 1025  . [START ON 03/02/2016] docusate (COLACE) 50 MG/5ML liquid 0.1 mg  0.1 mg Both Ears Q30 days [name], MD       . donepezil (ARICEPT) tablet 10 mg  10 mg Oral QHS [name], MD   10 mg at 02/07/16 2229  . enoxaparin (LOVENOX) injection 40 mg  40 mg Subcutaneous Q24H Donald Prose Runyon, RPH   40 mg at 02/07/16 1228  . gabapentin (NEURONTIN) capsule 300 mg  300 mg Oral TID Debbe Odea, MD   300 mg at 02/08/16 1549  . hydrALAZINE (APRESOLINE) tablet 25 mg  25 mg Oral Q8H [name], MD   25 mg at 02/08/16 1548  . insulin aspart (novoLOG) injection 0-9 Units  0-9 Units Subcutaneous TID WC [name], MD   2 Units at 02/08/16 1659  . insulin glargine (LANTUS) injection 10 Units  10 Units Subcutaneous QHS Albertine Patricia, MD   10 Units at 02/07/16 2229  . isosorbide mononitrate (IMDUR) 24 hr tablet 60 mg  60 mg Oral QHS [name], MD   60 mg at 02/07/16 2229  . labetalol (NORMODYNE) tablet 200 mg  200 mg Oral BID [name], MD   200 mg at 02/08/16 1025  . promethazine (PHENERGAN) tablet 12.5 mg  12.5 mg Oral Q12H PRN [name], MD      . sodium chloride flush (NS) 0.9 % injection 3 mL  3 mL Intravenous Q12H [name], MD   3 mL at 02/08/16 1044     Discharge Medications: Please see discharge summary for a list of discharge medications.  Relevant Imaging Results:  Relevant Lab Results:   Additional Information [id number]  [place] Anna, LCSW

## 2016-02-09 LAB — GLUCOSE, CAPILLARY
GLUCOSE-CAPILLARY: 129 mg/dL — AB (ref 65–99)
GLUCOSE-CAPILLARY: 170 mg/dL — AB (ref 65–99)
GLUCOSE-CAPILLARY: 135 mg/dL — AB (ref 65–99)

## 2016-02-09 NOTE — Progress Notes (Signed)
Patient discharged to ALF via [facility]. Condition unchanged. Eulas Post, RN

## 2016-02-09 NOTE — Care Management Note (Signed)
Case Management Note  Patient Details  Name: Katie Dawson MRN: [id number] Date of Birth: [birth year]  Subjective/Objective:    Acute Encephalopathy, metastatic cancer            Action/Plan: Discharge Planning: AVS reviewed:   Chart reviewed. Pt has [place] at ALF with [facility]. Contacted [facility] Liaison with resumption of care. CSW following for ALF placement.   PCP- Biagio Borg MD    Expected Discharge Date:  02/09/2016               Expected Discharge Plan:  Assisted Living / Rest Home  In-House Referral:  Clinical Social Work  Discharge planning Services  CM Consult  Post Acute Care Choice:  Home Health (Active w/[facility], OT) Choice offered to:  Patient  DME Arranged:  N/A DME Agency:  NA  HH Arranged:  RN, PT [place] Agency:  [place], Other - See comment  Status of Service:  Completed, signed off  If discussed at [place] of Stay Meetings, dates discussed:    Additional Comments:  Erenest Rasher, RN 02/09/2016, 7:20 PM

## 2016-02-09 NOTE — Clinical Social Work Note (Signed)
Clinical Social Work Assessment  Patient Details  Name: Katie Dawson MRN: [id number] Date of Birth: Aug 10, 1933  Date of referral:  02/08/16               Reason for consult:  Facility Placement                Permission sought to share information with:  Family Supports Permission granted to share information::  Yes, Verbal Permission Granted  Name::     [name] Hardison  Agency::     Relationship::  Daughter  Contact Information:  [phone]  Housing/Transportation Living arrangements for the past 2 months:  [place] of Information:  Adult Children Naval architect Hardison) Patient Interpreter Needed:  None Criminal Activity/Legal Involvement Pertinent to Current Situation/Hospitalization:  No - Comment as needed Significant Relationships:  Adult Children Lives with:  Facility Resident ([facility]) Do you feel safe going back to the place where you live?  Yes Need for family participation in patient care:  Yes (Comment)  Care giving concerns:  Daughter expressed concern regarding medication administration at ALF that may have caused patient to be admitted to hospital.   Social Worker assessment / plan:  CSW talked briefly with patient at the bedside regarding discharge disposition. Ms. Avey was sitting up in bed and was awake and alert, but quiet with a flat affect. Patient did give CSW permission to contact her daughter regarding discharge. Call made to daughter [name] regarding discharge. She is aware of discharge and understands that unit secretary will call her when transport arrives. Ms. Ivonne Andrew reported that patient has been at [facility] approximately 2 years and added that the name was changed recently after the company had a change in ownership. Although she has concerns about what caused her mother to come to the hospital, she does want her to return to the assisted living facility.  Employment status:  Retired Radiation protection practitioner:  Medicaid In [place], [place] PT Recommendations:  [place] (Patient returning to [facility]) Information / Referral to community resources:  Other (Comment Required) (None needed or requested as patient returning to ALF)  Patient/Family's Response to care:  No concerns expressed regarding care during hospitalization.  Patient/Family's Understanding of and Emotional Response to Diagnosis, Current Treatment, and Prognosis:  Not discussed.  Emotional Assessment Appearance:  Appears stated age Attitude/Demeanor/Rapport:  Lethargic (Quiet) Affect (typically observed):  Quiet, Flat Orientation:  Oriented to Self Alcohol / Substance use:  Tobacco Use, Alcohol Use, Illicit Drugs (Patient reported that she does not smoke, drink or use illicit drugs) Psych involvement (Current and /or in the community):  No (Comment)  Discharge Needs  Concerns to be addressed:  Discharge Planning Concerns Readmission within the last 30 days:  No Current discharge risk:  None Barriers to Discharge:  No Barriers Identified   Sable Feil, LCSW 02/09/2016, 2:11 PM

## 2016-02-09 NOTE — Clinical Social Work Note (Signed)
Patient medically stable for discharge back to [facility]. Facility contact, Margreta Journey contacted and discharge clinicals transmitted to facility. Ms. Musgrave will be transported by ambulance. Daughter contacted and is aware of discharge and that she will be called once transport arrives.  Rollie Hynek Givens, MSW, LCSW Licensed Clinical Social Worker [place] [phone]

## 2016-02-09 NOTE — Care Management Important Message (Signed)
Important Message  Patient Details  Name: Katie Dawson MRN: [id number] Date of Birth: 10/16/1933   Medicare Important Message Given:  Yes    Erenest Rasher, RN 02/09/2016, 11:55 AM

## 2016-02-10 ENCOUNTER — Other Ambulatory Visit: Payer: Self-pay | Admitting: Internal Medicine

## 2016-02-11 ENCOUNTER — Encounter: Payer: Self-pay | Admitting: Oncology

## 2016-02-11 ENCOUNTER — Telehealth: Payer: Self-pay | Admitting: *Deleted

## 2016-02-11 NOTE — Progress Notes (Signed)
I discussed the biopsy results with her daughter, Ms. Ivonne Andrew. She understands the diagnosis of metastatic renal cell carcinoma and agrees with comfort care. They plan to continue follow-up with Dr. Jenny Reichmann. I am available to see her as needed.

## 2016-02-11 NOTE — Telephone Encounter (Signed)
Pt was on TCM list admitted for altered mental status. Pt was d/c 02/08/16 sent back to ALF...Katie Dawson

## 2016-02-12 ENCOUNTER — Telehealth: Payer: Self-pay | Admitting: *Deleted

## 2016-02-12 ENCOUNTER — Telehealth: Payer: Self-pay | Admitting: Internal Medicine

## 2016-02-12 NOTE — Telephone Encounter (Signed)
rx for hosp bed done hardcopy to coriine

## 2016-02-12 NOTE — Telephone Encounter (Signed)
The home that pt is in is requesting an order for a hospital bed.  The pt was discharged from hospital and they recommend that pt get a hospital bed   Fax number [phone]  Attn: Angela Burke

## 2016-02-12 NOTE — Telephone Encounter (Signed)
Call from Clayton at [facility] requesting an order for hospital bed. Pt is not being followed in this office. Requested she forward request to pt's PCP.

## 2016-02-13 ENCOUNTER — Telehealth: Payer: Self-pay | Admitting: *Deleted

## 2016-02-13 DIAGNOSIS — R6251 Failure to thrive (child): Secondary | ICD-10-CM

## 2016-02-13 DIAGNOSIS — C7800 Secondary malignant neoplasm of unspecified lung: Secondary | ICD-10-CM

## 2016-02-13 NOTE — Telephone Encounter (Signed)
Patient's retirement home is calling advising that they did receive the script for the hospital bed. They are asking where it is that we need to send the script to have the order fulfilled.

## 2016-02-13 NOTE — Telephone Encounter (Signed)
faxed

## 2016-02-13 NOTE — Telephone Encounter (Signed)
Received call from daughter [name] requesting to speak with Dr. Benay Spice about her mother's condition.  Stated she still has several more questions to discuss with md. Katie Dawson's     Phone     [phone].

## 2016-02-14 NOTE — Telephone Encounter (Signed)
Spoke with pt's daughter, informed her that Dr. Benay Spice called x2 with no answer today. She asks if Dr. Gearldine Shown mention of hospice meant palliative care or full hospice? Does he think she has less than 6 months? The family has not heard from hospice yet. Family has not contacted PCP. Per [name], pt is in an assisted living type facility and she is considering bringing her home with hospice.

## 2016-02-14 NOTE — Telephone Encounter (Signed)
I think she is a candidate for full hospice

## 2016-02-15 ENCOUNTER — Telehealth: Payer: Self-pay | Admitting: *Deleted

## 2016-02-15 NOTE — Telephone Encounter (Signed)
Discussed case with daughter.  She requests hospice referral for her mother.  Plans to f/u with Dr. Jenny Reichmann

## 2016-02-15 NOTE — Telephone Encounter (Signed)
Dr. Benay Spice spoke with patient's daughter this AM and she is requesting Hospice for her mother at this time. Corin at  Dr. Jeneen Rinks John's office notified of hospice request and she states that Dr. Jenny Reichmann will be pt.'s attending MD with Hospice.  Referral called to [facility] by this RN.

## 2016-02-15 NOTE — Telephone Encounter (Signed)
Referral made to [facility] and Dr. Judi Cong office notified of hospice request.

## 2016-02-15 NOTE — Telephone Encounter (Signed)
[place] for hospice referral, I can be attending, but ok for Hospice MD for symptomatic medications  Lucy to let family and [facility] know, thanks

## 2016-02-18 ENCOUNTER — Inpatient Hospital Stay (HOSPITAL_COMMUNITY)
Admission: EM | Admit: 2016-02-18 | Discharge: 2016-03-03 | DRG: 682 | Disposition: A | Discharge status: Hospice | Payer: Medicare Other | Attending: Internal Medicine | Admitting: Internal Medicine

## 2016-02-18 ENCOUNTER — Emergency Department (HOSPITAL_COMMUNITY): Payer: Medicare Other

## 2016-02-18 ENCOUNTER — Encounter (HOSPITAL_COMMUNITY): Payer: Self-pay | Admitting: Emergency Medicine

## 2016-02-18 DIAGNOSIS — E785 Hyperlipidemia, unspecified: Secondary | ICD-10-CM | POA: Diagnosis present

## 2016-02-18 DIAGNOSIS — F419 Anxiety disorder, unspecified: Secondary | ICD-10-CM | POA: Diagnosis present

## 2016-02-18 DIAGNOSIS — Z86010 Personal history of colonic polyps: Secondary | ICD-10-CM

## 2016-02-18 DIAGNOSIS — G4733 Obstructive sleep apnea (adult) (pediatric): Secondary | ICD-10-CM | POA: Diagnosis present

## 2016-02-18 DIAGNOSIS — I10 Essential (primary) hypertension: Secondary | ICD-10-CM | POA: Diagnosis present

## 2016-02-18 DIAGNOSIS — D72829 Elevated white blood cell count, unspecified: Secondary | ICD-10-CM | POA: Diagnosis not present

## 2016-02-18 DIAGNOSIS — Z66 Do not resuscitate: Secondary | ICD-10-CM | POA: Diagnosis not present

## 2016-02-18 DIAGNOSIS — I9589 Other hypotension: Secondary | ICD-10-CM | POA: Diagnosis present

## 2016-02-18 DIAGNOSIS — E43 Unspecified severe protein-calorie malnutrition: Secondary | ICD-10-CM | POA: Diagnosis present

## 2016-02-18 DIAGNOSIS — B9620 Unspecified Escherichia coli [E. coli] as the cause of diseases classified elsewhere: Secondary | ICD-10-CM | POA: Diagnosis present

## 2016-02-18 DIAGNOSIS — N179 Acute kidney failure, unspecified: Secondary | ICD-10-CM | POA: Diagnosis not present

## 2016-02-18 DIAGNOSIS — G9341 Metabolic encephalopathy: Secondary | ICD-10-CM | POA: Diagnosis present

## 2016-02-18 DIAGNOSIS — D509 Iron deficiency anemia, unspecified: Secondary | ICD-10-CM | POA: Diagnosis present

## 2016-02-18 DIAGNOSIS — G9340 Encephalopathy, unspecified: Secondary | ICD-10-CM

## 2016-02-18 DIAGNOSIS — I129 Hypertensive chronic kidney disease with stage 1 through stage 4 chronic kidney disease, or unspecified chronic kidney disease: Secondary | ICD-10-CM | POA: Diagnosis present

## 2016-02-18 DIAGNOSIS — I959 Hypotension, unspecified: Secondary | ICD-10-CM | POA: Diagnosis present

## 2016-02-18 DIAGNOSIS — F0390 Unspecified dementia without behavioral disturbance: Secondary | ICD-10-CM | POA: Diagnosis present

## 2016-02-18 DIAGNOSIS — R7881 Bacteremia: Secondary | ICD-10-CM | POA: Diagnosis present

## 2016-02-18 DIAGNOSIS — L89150 Pressure ulcer of sacral region, unstageable: Secondary | ICD-10-CM | POA: Diagnosis present

## 2016-02-18 DIAGNOSIS — Z85528 Personal history of other malignant neoplasm of kidney: Secondary | ICD-10-CM

## 2016-02-18 DIAGNOSIS — E1122 Type 2 diabetes mellitus with diabetic chronic kidney disease: Secondary | ICD-10-CM | POA: Diagnosis present

## 2016-02-18 DIAGNOSIS — Z8249 Family history of ischemic heart disease and other diseases of the circulatory system: Secondary | ICD-10-CM

## 2016-02-18 DIAGNOSIS — Z515 Encounter for palliative care: Secondary | ICD-10-CM

## 2016-02-18 DIAGNOSIS — N3000 Acute cystitis without hematuria: Secondary | ICD-10-CM

## 2016-02-18 DIAGNOSIS — N189 Chronic kidney disease, unspecified: Secondary | ICD-10-CM

## 2016-02-18 DIAGNOSIS — R590 Localized enlarged lymph nodes: Secondary | ICD-10-CM | POA: Diagnosis present

## 2016-02-18 DIAGNOSIS — C7802 Secondary malignant neoplasm of left lung: Secondary | ICD-10-CM | POA: Diagnosis present

## 2016-02-18 DIAGNOSIS — Z993 Dependence on wheelchair: Secondary | ICD-10-CM

## 2016-02-18 DIAGNOSIS — K219 Gastro-esophageal reflux disease without esophagitis: Secondary | ICD-10-CM | POA: Diagnosis present

## 2016-02-18 DIAGNOSIS — N183 Chronic kidney disease, stage 3 unspecified: Secondary | ICD-10-CM | POA: Diagnosis present

## 2016-02-18 DIAGNOSIS — Z794 Long term (current) use of insulin: Secondary | ICD-10-CM

## 2016-02-18 DIAGNOSIS — E11649 Type 2 diabetes mellitus with hypoglycemia without coma: Secondary | ICD-10-CM | POA: Diagnosis not present

## 2016-02-18 DIAGNOSIS — Z7189 Other specified counseling: Secondary | ICD-10-CM

## 2016-02-18 DIAGNOSIS — Z6831 Body mass index (BMI) 31.0-31.9, adult: Secondary | ICD-10-CM

## 2016-02-18 DIAGNOSIS — Z905 Acquired absence of kidney: Secondary | ICD-10-CM

## 2016-02-18 DIAGNOSIS — Z7982 Long term (current) use of aspirin: Secondary | ICD-10-CM

## 2016-02-18 DIAGNOSIS — Y838 Other surgical procedures as the cause of abnormal reaction of the patient, or of later complication, without mention of misadventure at the time of the procedure: Secondary | ICD-10-CM | POA: Diagnosis not present

## 2016-02-18 DIAGNOSIS — Z79899 Other long term (current) drug therapy: Secondary | ICD-10-CM

## 2016-02-18 DIAGNOSIS — E669 Obesity, unspecified: Secondary | ICD-10-CM | POA: Diagnosis present

## 2016-02-18 DIAGNOSIS — E860 Dehydration: Secondary | ICD-10-CM | POA: Diagnosis present

## 2016-02-18 DIAGNOSIS — Z87440 Personal history of urinary (tract) infections: Secondary | ICD-10-CM

## 2016-02-18 DIAGNOSIS — M109 Gout, unspecified: Secondary | ICD-10-CM | POA: Diagnosis present

## 2016-02-18 DIAGNOSIS — F329 Major depressive disorder, single episode, unspecified: Secondary | ICD-10-CM | POA: Diagnosis present

## 2016-02-18 DIAGNOSIS — I82621 Acute embolism and thrombosis of deep veins of right upper extremity: Secondary | ICD-10-CM | POA: Diagnosis not present

## 2016-02-18 DIAGNOSIS — E870 Hyperosmolality and hypernatremia: Secondary | ICD-10-CM | POA: Diagnosis not present

## 2016-02-18 DIAGNOSIS — T8172XA Complication of vein following a procedure, not elsewhere classified, initial encounter: Secondary | ICD-10-CM | POA: Diagnosis not present

## 2016-02-18 DIAGNOSIS — M170 Bilateral primary osteoarthritis of knee: Secondary | ICD-10-CM | POA: Diagnosis present

## 2016-02-18 DIAGNOSIS — E876 Hypokalemia: Secondary | ICD-10-CM | POA: Diagnosis present

## 2016-02-18 DIAGNOSIS — N390 Urinary tract infection, site not specified: Secondary | ICD-10-CM | POA: Diagnosis present

## 2016-02-18 HISTORY — DX: Malignant neoplasm of unspecified kidney, except renal pelvis: C64.9

## 2016-02-18 LAB — COMPREHENSIVE METABOLIC PANEL
ALBUMIN: 3 g/dL — AB (ref 3.5–5.0)
ALK PHOS: 87 U/L (ref 38–126)
ALT: 22 U/L (ref 14–54)
AST: 14 U/L — ABNORMAL LOW (ref 15–41)
Anion gap: 9 (ref 5–15)
BUN: 52 mg/dL — AB (ref 6–20)
CALCIUM: 9.3 mg/dL (ref 8.9–10.3)
CO2: 23 mmol/L (ref 22–32)
Chloride: 109 mmol/L (ref 101–111)
Creatinine, Ser: 2.36 mg/dL — ABNORMAL HIGH (ref 0.44–1.00)
GFR calc Af Amer: 21 mL/min — ABNORMAL LOW (ref >60)
GFR calc non Af Amer: 18 mL/min — ABNORMAL LOW (ref >60)
GLUCOSE: 184 mg/dL — AB (ref 65–99)
Potassium: 4.1 mmol/L (ref 3.5–5.1)
SODIUM: 141 mmol/L (ref 135–145)
Total Bilirubin: 0.6 mg/dL (ref 0.3–1.2)
Total Protein: 6.7 g/dL (ref 6.5–8.1)

## 2016-02-18 LAB — URINALYSIS, ROUTINE W REFLEX MICROSCOPIC
Bilirubin Urine: NEGATIVE
GLUCOSE, UA: NEGATIVE mg/dL
HGB URINE DIPSTICK: NEGATIVE
Ketones, ur: NEGATIVE mg/dL
Nitrite: NEGATIVE
Protein, ur: 100 mg/dL — AB
SPECIFIC GRAVITY, URINE: 1.013 (ref 1.005–1.030)
pH: 6 (ref 5.0–8.0)

## 2016-02-18 LAB — CBC WITH DIFFERENTIAL/PLATELET
BASOS PCT: 0 %
Basophils Absolute: 0 10*3/uL (ref 0.0–0.1)
EOS ABS: 0 10*3/uL (ref 0.0–0.7)
Eosinophils Relative: 0 %
HCT: 31.3 % — ABNORMAL LOW (ref 36.0–46.0)
HEMOGLOBIN: 9.9 g/dL — AB (ref 12.0–15.0)
LYMPHS ABS: 1.1 10*3/uL (ref 0.7–4.0)
Lymphocytes Relative: 4 %
MCH: 28.3 pg (ref 26.0–34.0)
MCHC: 31.6 g/dL (ref 30.0–36.0)
MCV: 89.4 fL (ref 78.0–100.0)
Monocytes Absolute: 0.9 10*3/uL (ref 0.1–1.0)
Monocytes Relative: 3 %
NEUTROS ABS: 23.9 10*3/uL — AB (ref 1.7–7.7)
NEUTROS PCT: 92 %
Platelets: 365 10*3/uL (ref 150–400)
RBC: 3.5 MIL/uL — AB (ref 3.87–5.11)
RDW: 14.6 % (ref 11.5–15.5)
WBC: 25.9 10*3/uL — AB (ref 4.0–10.5)

## 2016-02-18 LAB — I-STAT CG4 LACTIC ACID, ED
LACTIC ACID, VENOUS: 1.3 mmol/L (ref 0.5–1.9)
Lactic Acid, Venous: 0.9 mmol/L (ref 0.5–1.9)

## 2016-02-18 LAB — URINE MICROSCOPIC-ADD ON

## 2016-02-18 LAB — TROPONIN I: Troponin I: <0.03 ng/mL (ref <0.03)

## 2016-02-18 MED ORDER — INSULIN ASPART 100 UNIT/ML ~~LOC~~ SOLN
0.0000 [IU] | Freq: Every day | SUBCUTANEOUS | Status: DC
  Administered 2016-02-20: 2 [IU] via SUBCUTANEOUS

## 2016-02-18 MED ORDER — ATORVASTATIN CALCIUM 10 MG PO TABS
10.0000 mg | ORAL_TABLET | Freq: Every day | ORAL | Status: DC
  Filled 2016-02-18 (×3): qty 1

## 2016-02-18 MED ORDER — INSULIN GLARGINE 100 UNIT/ML ~~LOC~~ SOLN
10.0000 [IU] | Freq: Every day | SUBCUTANEOUS | Status: DC
  Administered 2016-02-19 – 2016-03-02 (×11): 10 [IU] via SUBCUTANEOUS
  Filled 2016-02-18 (×15): qty 0.1

## 2016-02-18 MED ORDER — DEXTROSE 5 % IV SOLN
1.0000 g | INTRAVENOUS | Status: DC
  Administered 2016-02-19: 1 g via INTRAVENOUS
  Filled 2016-02-18 (×2): qty 10

## 2016-02-18 MED ORDER — DOCUSATE SODIUM 50 MG/5ML PO LIQD
0.1000 mg | ORAL | Status: DC
  Filled 2016-02-18 (×2): qty 10

## 2016-02-18 MED ORDER — GABAPENTIN 300 MG PO CAPS
300.0000 mg | ORAL_CAPSULE | Freq: Three times a day (TID) | ORAL | Status: DC
  Administered 2016-02-19 – 2016-02-20 (×2): 300 mg via ORAL
  Filled 2016-02-18 (×4): qty 1

## 2016-02-18 MED ORDER — ACETAMINOPHEN 325 MG PO TABS: 650.0000 mg | ORAL_TABLET | Freq: Four times a day (QID) | ORAL | Status: DC | PRN

## 2016-02-18 MED ORDER — ONDANSETRON HCL 4 MG PO TABS: 4.0000 mg | ORAL_TABLET | Freq: Four times a day (QID) | ORAL | Status: DC | PRN

## 2016-02-18 MED ORDER — CITALOPRAM HYDROBROMIDE 20 MG PO TABS
10.0000 mg | ORAL_TABLET | Freq: Every day | ORAL | Status: DC
  Administered 2016-02-19: 10 mg via ORAL
  Filled 2016-02-18 (×3): qty 1

## 2016-02-18 MED ORDER — SODIUM CHLORIDE 0.9 % IV SOLN
INTRAVENOUS | Status: AC
  Administered 2016-02-18: 1000 mL via INTRAVENOUS

## 2016-02-18 MED ORDER — HYDROCODONE-ACETAMINOPHEN 5-325 MG PO TABS: 1.0000 | ORAL_TABLET | ORAL | Status: DC | PRN

## 2016-02-18 MED ORDER — LABETALOL HCL 100 MG PO TABS
100.0000 mg | ORAL_TABLET | Freq: Two times a day (BID) | ORAL | Status: DC
  Administered 2016-02-19: 100 mg via ORAL
  Filled 2016-02-18 (×9): qty 1

## 2016-02-18 MED ORDER — SODIUM CHLORIDE 0.9 % IV BOLUS (SEPSIS)
500.0000 mL | Freq: Once | INTRAVENOUS | Status: AC
  Administered 2016-02-19: 500 mL via INTRAVENOUS

## 2016-02-18 MED ORDER — SODIUM CHLORIDE 0.9 % IV BOLUS (SEPSIS)
1000.0000 mL | Freq: Once | INTRAVENOUS | Status: AC
  Administered 2016-02-18: 1000 mL via INTRAVENOUS

## 2016-02-18 MED ORDER — CLONIDINE HCL 0.1 MG PO TABS
0.1000 mg | ORAL_TABLET | Freq: Two times a day (BID) | ORAL | Status: DC
  Administered 2016-02-19: 0.1 mg via ORAL
  Filled 2016-02-18 (×4): qty 1

## 2016-02-18 MED ORDER — CEFTRIAXONE SODIUM 1 G IJ SOLR
1.0000 g | Freq: Once | INTRAMUSCULAR | Status: AC
  Administered 2016-02-18: 1 g via INTRAVENOUS
  Filled 2016-02-18: qty 10

## 2016-02-18 MED ORDER — ONDANSETRON HCL 4 MG/2ML IJ SOLN: 4.0000 mg | Freq: Four times a day (QID) | INTRAMUSCULAR | Status: DC | PRN

## 2016-02-18 MED ORDER — INSULIN ASPART 100 UNIT/ML ~~LOC~~ SOLN
0.0000 [IU] | Freq: Three times a day (TID) | SUBCUTANEOUS | Status: DC
  Administered 2016-02-19 – 2016-02-22 (×4): 1 [IU] via SUBCUTANEOUS
  Administered 2016-02-23: 2 [IU] via SUBCUTANEOUS
  Administered 2016-02-23: 1 [IU] via SUBCUTANEOUS
  Administered 2016-02-24 (×2): 2 [IU] via SUBCUTANEOUS
  Administered 2016-02-24: 3 [IU] via SUBCUTANEOUS
  Administered 2016-02-25: 2 [IU] via SUBCUTANEOUS
  Administered 2016-02-25 – 2016-02-26 (×5): 1 [IU] via SUBCUTANEOUS
  Administered 2016-02-28: 2 [IU] via SUBCUTANEOUS
  Administered 2016-02-28 – 2016-03-02 (×4): 1 [IU] via SUBCUTANEOUS

## 2016-02-18 MED ORDER — ASPIRIN EC 81 MG PO TBEC
81.0000 mg | DELAYED_RELEASE_TABLET | Freq: Every day | ORAL | Status: DC
  Filled 2016-02-18 (×2): qty 1

## 2016-02-18 MED ORDER — ENOXAPARIN SODIUM 30 MG/0.3ML ~~LOC~~ SOLN
30.0000 mg | Freq: Every day | SUBCUTANEOUS | Status: DC
  Administered 2016-02-19 – 2016-02-20 (×3): 30 mg via SUBCUTANEOUS
  Filled 2016-02-18 (×3): qty 0.3

## 2016-02-18 MED ORDER — ISOSORBIDE MONONITRATE ER 60 MG PO TB24
60.0000 mg | ORAL_TABLET | Freq: Every day | ORAL | Status: DC
  Filled 2016-02-18 (×3): qty 1

## 2016-02-18 MED ORDER — ACETAMINOPHEN 650 MG RE SUPP
650.0000 mg | Freq: Four times a day (QID) | RECTAL | Status: DC | PRN
  Administered 2016-02-19: 650 mg via RECTAL
  Filled 2016-02-18: qty 1

## 2016-02-18 MED ORDER — DONEPEZIL HCL 10 MG PO TABS
10.0000 mg | ORAL_TABLET | Freq: Every day | ORAL | Status: DC
  Filled 2016-02-18 (×2): qty 1

## 2016-02-18 MED ORDER — RISPERIDONE 0.5 MG PO TABS
0.5000 mg | ORAL_TABLET | Freq: Every day | ORAL | Status: DC
  Administered 2016-02-22: 0.5 mg via ORAL
  Filled 2016-02-18 (×3): qty 1
  Filled 2016-02-18: qty 2
  Filled 2016-02-18 (×2): qty 1

## 2016-02-18 MED ORDER — NYSTATIN 100000 UNIT/GM EX POWD
1.0000 g | Freq: Four times a day (QID) | CUTANEOUS | Status: DC
  Administered 2016-02-19 – 2016-03-03 (×41): 1 g via TOPICAL
  Filled 2016-02-18 (×2): qty 15

## 2016-02-18 NOTE — Addendum Note (Signed)
Addended by: Earnstine Regal on: 02/18/2016 08:46 AM   Modules accepted: Orders

## 2016-02-18 NOTE — Addendum Note (Signed)
Addended by: Earnstine Regal on: 02/18/2016 08:53 AM   Modules accepted: Orders

## 2016-02-18 NOTE — Progress Notes (Signed)
EDCM went to speak to patient at bedside, however, RN at bedside performing procedure.  No family at bedside currently.

## 2016-02-18 NOTE — ED Triage Notes (Signed)
Pt is from [facility].  Sent for being lethargic.  Facility reports that this has been an ongoing issue.  This episode began yesterday.  Prior episode was at the end of last week and she was evaluated in ED at that time.  BP 94/45  P 60 96% on RA.  Denies pain.  Pt is oriented to self, verbalizes and uses a wheelchair at baseline per facility.  She is oriented to self on arrival to ED today.  CBG 209.

## 2016-02-18 NOTE — ED Notes (Signed)
2 ultrasound IV start attempts by Thomos Lemons, RN.  Dr Myrene Buddy notified, states he will try for ultrasound IV.  IV team also bedside.

## 2016-02-18 NOTE — ED Notes (Signed)
RN and Occupational psychologist regarding Pts wait time.

## 2016-02-18 NOTE — ED Notes (Signed)
Unable to collect labs mat this time xray is in the room with the patient

## 2016-02-18 NOTE — ED Provider Notes (Signed)
[facility] DEPT Provider Note   CSN: [id number] Arrival date & time: 02/18/16  1222     History   Chief Complaint Chief Complaint  Patient presents with  . Fatigue    HPI Katie Dawson is a 82 y.o. female.  HPI 82 year old female with past medical history of hypertension, hyperlipidemia, recurrent UTIs, dementia, who presents with fatigue. Per report from the nursing facility, the patient has been decreasingly responsive over the last 24 hours. Her blood pressure was 90s over 60s which is low for her. Patient has also had decreased by mouth intake. At baseline, she is alert and oriented to person and place but is now oriented to person only. Facility. Denies any recent falls. Remainder of history limited due to dementia.  Past Medical History:  Diagnosis Date  . ANEMIA-IRON DEFICIENCY 04/22/2007  . ANXIETY 04/22/2007  . COLONIC POLYPS, HX OF 04/22/2007  . Dementia   . DIABETES MELLITUS, TYPE II 04/22/2007  . FOOT PAIN, LEFT 12/23/2007  . GERD 04/22/2007  . GOUT 04/22/2007  . HYPERLIPIDEMIA 04/22/2007  . HYPERSOMNIA 01/11/2009  . HYPERTENSION 04/22/2007  . INTERMITTENT VERTIGO 04/20/2009  . NEPHRECTOMY, HX OF 04/22/2007  . OBSTRUCTIVE SLEEP APNEA 04/22/2007  . 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 OSTEOPENIA 02/12/2009  . RENAL INSUFFICIENCY 04/22/2007  . Right fibular fracture July 2013  . Urinary tract infection    hx of    Patient Active Problem List   Diagnosis Date Noted  . DM type 2 causing CKD stage 3 ([place]) 02/18/2016  . Hypotension 02/18/2016  . Metastatic cancer to lung ([place]) 02/08/2016  . Acute encephalopathy   . AKI (acute kidney injury) ([place])   . External hemorrhoid, thrombosed 01/04/2016  . Hematochezia 01/04/2016  . Syncope 10/07/2015  . Lethargy 09/13/2015  . Dizziness 04/05/2014  . Abnormal urine odor 02/07/2014  . UTI (urinary tract infection) 10/17/2013  . Hypoglycemia 10/16/2013  . Heart murmur 09/16/2013  . Agitation 09/16/2013   . Back pain 05/18/2013  . Altered mental state 05/15/2013  . Lower extremity weakness 05/15/2013  . ARF (acute renal failure) ([place]) 05/15/2013  . Leucocytosis 05/15/2013  . Peripheral neuropathy ([place]) 05/15/2013  . Aortic stenosis 02/10/2013  . Dementia 02/10/2013  . Vitamin D deficiency 04/10/2012  . Depression 04/10/2012  . Confusion 04/08/2012  . Closed fibular fracture 12/24/2011  . Wrist fracture, closed 12/24/2011  . Fatigue 11/05/2010  . Preventative health care 11/03/2010  . INTERMITTENT VERTIGO 04/20/2009  . OSTEOPENIA 02/12/2009  . FOOT PAIN, LEFT 12/23/2007  . Insulin dependent diabetes mellitus ([place]) 04/22/2007  . HLD (hyperlipidemia) 04/22/2007  . GOUT 04/22/2007  . ANEMIA-IRON DEFICIENCY 04/22/2007  . ANXIETY 04/22/2007  . Sleep apnea 04/22/2007  . Essential hypertension 04/22/2007  . GERD 04/22/2007  . Disorder resulting from impaired renal function 04/22/2007  . 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 COLONIC POLYPS, HX OF 04/22/2007  . NEPHRECTOMY, HX OF 04/22/2007    Past Surgical History:  Procedure Laterality Date  . ORIF WRIST FRACTURE  01/02/2012   Procedure: OPEN REDUCTION INTERNAL FIXATION (ORIF) WRIST FRACTURE;  Surgeon: Schuyler Amor, MD;  Location: [facility];  Service: Orthopedics;  Laterality: Right;  Open Reduction Internal Fixation Right Distal Radius   . s/p parathyroid surgury    . s/p right nephrectomy      OB History    No data available       Home Medications    Prior to Admission medications   Medication Sig Start Date End Date Taking?  Authorizing Provider  acetaminophen (TYLENOL) 325 MG tablet Take 650 mg by mouth every 8 (eight) hours.   Yes Historical Provider, MD  amLODipine (NORVASC) 10 MG tablet Take 10 mg by mouth daily.   Yes Historical Provider, MD  aspirin EC 81 MG tablet Take 1 tablet (81 mg total) by mouth daily. 04/07/14  Yes Thurnell Lose, MD  atorvastatin (LIPITOR) 10 MG tablet Take 10 mg by mouth daily.    Yes Historical Provider, MD  B-D INS SYRINGE 0.5CC/31GX5/16 31G X 5/16" 0.5 ML MISC USE AS DIRECTED FOR INSULIN ADMINISTRATION. 01/01/16  Yes Biagio Borg, MD  BD PEN NEEDLE NANO U/F 32G X 4 MM MISC USE WITH LANTUS DAILY.   Yes Biagio Borg, MD  citalopram (CELEXA) 10 MG tablet Take 10 mg by mouth daily.   Yes Historical Provider, MD  cloNIDine (CATAPRES) 0.1 MG tablet Take 0.1 mg by mouth 2 (two) times daily.   Yes Historical Provider, MD  docusate (COLACE) 50 MG/5ML liquid Take 0.1 mg by mouth every 30 (thirty) days. In each ear once monthly. Wait for 10 minutes and then rinse   Yes Historical Provider, MD  donepezil (ARICEPT) 10 MG tablet TAKE ONE TABLET BY MOUTH EVERY DAY 02/12/16  Yes Biagio Borg, MD  gabapentin (NEURONTIN) 300 MG capsule TAKE ONE CAPSULE BY MOUTH THREE TIMES DAILY Patient taking differently: TAKE 300mg  CAPSULE BY MOUTH THREE TIMES DAILY 02/12/16  Yes Biagio Borg, MD  glucose blood (BAYER CONTOUR NEXT TEST) test strip Use as instructed 09/16/13  Yes Biagio Borg, MD  hydrALAZINE (APRESOLINE) 25 MG tablet TAKE ONE TABLET BY MOUTH EVERY 8 HOURS Patient taking differently: TAKE 25mg  TABLET BY MOUTH EVERY 8 HOURS 02/12/16  Yes Biagio Borg, MD  isosorbide mononitrate (IMDUR) 60 MG 24 hr tablet Take 60 mg by mouth daily.    Yes Historical Provider, MD  labetalol (NORMODYNE) 200 MG tablet Take 200 mg by mouth 2 (two) times daily.   Yes Historical Provider, MD  Lancets MISC Use as directed 1 per day 09/16/13  Yes Biagio Borg, MD  LANTUS 100 UNIT/ML injection INJECT 16 UNITS SUBCUTANEOUSLY EVERY DAY AT BEDTIME. 02/12/16  Yes Biagio Borg, MD  nystatin (MYCOSTATIN/NYSTOP) powder Apply 1 g topically 4 (four) times daily.   Yes Historical Provider, MD  promethazine (PHENERGAN) 12.5 MG tablet Take 12.5 mg by mouth every 12 (twelve) hours as needed for nausea or vomiting.   Yes Historical Provider, MD  risperiDONE (RISPERDAL) 0.5 MG tablet Take 0.5 mg by mouth at bedtime.   Yes Historical  Provider, MD  NON-ASPIRIN PAIN RELIEF 325 MG tablet TAKE TWO TABLETS BY MOUTH EVERY EIGHT HOURS Patient not taking: Reported on 02/18/2016 02/12/16   Biagio Borg, MD    Family History Family History  Problem Relation Age of Onset  . Heart disease Mother     Social History Social History  Substance Use Topics  . Smoking status: Never Smoker  . Smokeless tobacco: Never Used  . Alcohol use No     Allergies   Review of patient's allergies indicates no known allergies.   Review of Systems Review of Systems  Unable to perform ROS: Dementia     Physical Exam Updated Vital Signs BP 148/96 (BP Location: Right Arm)   Pulse 90   Temp 98.3 F (36.8 C) (Oral)   Resp 18   Ht 5\' 5"  (1.651 m)   Wt 188 lb (85.3 kg)   SpO2 100%  BMI 31.28 kg/m   Physical Exam  Constitutional: She appears well-developed and well-nourished. No distress.  HENT:  Head: Normocephalic and atraumatic.  Mouth/Throat: Oropharynx is clear and moist.  Eyes: Conjunctivae are normal.  Neck: Normal range of motion. Neck supple.  Cardiovascular: Normal rate, regular rhythm and normal heart sounds.  Exam reveals no friction rub.   No murmur heard. Pulmonary/Chest: Effort normal and breath sounds normal. No respiratory distress. She has no wheezes. She has no rales.  Abdominal: Soft. Bowel sounds are normal. She exhibits no distension. There is no tenderness. There is no guarding.  Musculoskeletal: She exhibits no edema.  Neurological: She is alert. She exhibits normal muscle tone.  Oriented to person only. States she is in the hospital but does not know which one. Moves all extremities with 5 out of 5 strength. Normal sensation and distal upper and lower extremities bilaterally.. Face is symmetric.  Skin: Skin is warm. Capillary refill takes less than 2 seconds.  Psychiatric: She has a normal mood and affect.  Nursing note and vitals reviewed.    ED Treatments / Results  Labs (all labs ordered are  listed, but only abnormal results are displayed) Labs Reviewed  CBC WITH DIFFERENTIAL/PLATELET - Abnormal; Notable for the following:       Result Value   WBC 25.9 (*)    RBC 3.50 (*)    Hemoglobin 9.9 (*)    HCT 31.3 (*)    Neutro Abs 23.9 (*)    All other components within normal limits  COMPREHENSIVE METABOLIC PANEL - Abnormal; Notable for the following:    Glucose, Bld 184 (*)    BUN 52 (*)    Creatinine, Ser 2.36 (*)    Albumin 3.0 (*)    AST 14 (*)    GFR calc non Af Amer 18 (*)    GFR calc Af Amer 21 (*)    All other components within normal limits  URINALYSIS, ROUTINE W REFLEX MICROSCOPIC (NOT AT [facility]) - Abnormal; Notable for the following:    Color, Urine AMBER (*)    APPearance CLOUDY (*)    Protein, ur 100 (*)    Leukocytes, UA MODERATE (*)    All other components within normal limits  URINE MICROSCOPIC-ADD ON - Abnormal; Notable for the following:    Squamous Epithelial / LPF 0-5 (*)    Bacteria, UA MANY (*)    All other components within normal limits  CULTURE, BLOOD (ROUTINE X 2)  CULTURE, BLOOD (ROUTINE X 2)  URINE CULTURE  TROPONIN I  I-STAT CG4 LACTIC ACID, ED  I-STAT CG4 LACTIC ACID, ED    EKG  EKG Interpretation  Date/Time:  Monday February 18 2016 12:41:09 EDT Ventricular Rate:  65 PR Interval:    QRS Duration: 96 QT Interval:  414 QTC Calculation: 431 R Axis:   11 Text Interpretation:  Sinus rhythm Probable left atrial enlargement Baseline wander No significant change since last tracing Confirmed by Zalman Hull MD, Dakwon Wenberg [phone]) on 02/18/2016 7:29:34 PM       Radiology Ct Head Wo Contrast  Result Date: 02/18/2016 CLINICAL DATA:  Lethargy.  Metastatic disease. EXAM: CT HEAD WITHOUT CONTRAST TECHNIQUE: Contiguous axial images were obtained from the base of the skull through the vertex without intravenous contrast. COMPARISON:  CT head 02/04/2016 FINDINGS: Brain: Moderate atrophy. Low density throughout the cerebral white matter is unchanged and  appears chronic. Negative for mass lesion. No shift of the midline structures. Negative for hemorrhage. No acute infarct. Vascular: No  hyperdense vessel or unexpected calcification. Skull: Normal. Negative for fracture or focal lesion. Sinuses/Orbits: No acute finding. Other: None. IMPRESSION: Atrophy and chronic white matter changes are stable. No superimposed acute abnormality. Electronically Signed   By: Franchot Gallo M.D.   On: 02/18/2016 17:44   Dg Chest Portable 1 View  Result Date: 02/18/2016 CLINICAL DATA:  Altered mental status. History of previous renal cell carcinoma. EXAM: PORTABLE CHEST 1 VIEW COMPARISON:  Chest CT April 06, 2016 FINDINGS: The previously noted mass in the left upper lobe near the apex is visualize, currently measuring 4.0 x 3.4 cm in size. There is no edema or consolidation. There is bilateral hilar adenopathy, more pronounced on the left than on the right, stable. There are S scattered nodular opacities in both lower lobes, better appreciated on recent CT than on the current radiographic examination. No new opacity is evident. There is atherosclerotic calcification in the aortic arch region. There are postoperative clips in the midline cervical-thoracic region. No bone lesions are evident. IMPRESSION: Evidence of metastatic disease with lung masses and adenopathy, better characterized on recent chest CT. No new opacity evident. No airspace consolidations suggestive of acute pneumonia. There is aortic atherosclerosis. Electronically Signed   By: Lowella Grip III M.D.   On: 02/18/2016 14:15    Procedures Procedures (including critical care time)  Medications Ordered in ED Medications  sodium chloride 0.9 % bolus 1,000 mL (1,000 mLs Intravenous New Bag/Given 02/18/16 1715)  sodium chloride 0.9 % bolus 1,000 mL (1,000 mLs Intravenous New Bag/Given 02/18/16 1714)  cefTRIAXone (ROCEPHIN) 1 g in dextrose 5 % 50 mL IVPB (0 g Intravenous Stopped 02/18/16 1853)  sodium  chloride 0.9 % bolus 1,000 mL (0 mLs Intravenous Stopped 02/18/16 1852)    Angiocath insertion Performed by: Evonnie Pat  Consent: Verbal consent obtained. Risks and benefits: risks, benefits and alternatives were discussed Time out: Immediately prior to procedure a "time out" was called to verify the correct patient, procedure, equipment, support staff and site/side marked as required.  Preparation: Patient was prepped and draped in the usual sterile fashion.  Vein Location: left forearm  Ultrasound Guided  Gauge: 20  Normal blood return and flush without difficulty Patient tolerance: Patient tolerated the procedure well with no immediate complications.     Initial Impression / Assessment and Plan / ED Course  I have reviewed the triage vital signs and the nursing notes.  Pertinent labs & imaging results that were available during my care of the patient were reviewed by me and considered in my medical decision making (see chart for details).  Clinical Course    82 year old female with past mental history dementia and recurrent UTIs who presents with confusion and decreased level of consciousness. On arrival, patient is alert but oriented to person only. She has no new focal neurological deficits. She is otherwise well-appearing. She has no tachycardia, tachypnea, and is afebrile here. Does not meet sepsis criteria. Primary suspicion is acute UTI, pneumonia, or other occult infection. Will send broad labs and reassess.   Labs reviewed as above. CBC shows leukocytosis of 25,900 with neutrophil predominance, consistent with acute infection. Urinalysis shows too numerous to count white blood cells consistent with UTI. CMP also shows acute on chronic kidney injury, likely secondary to prerenal etiology. Lactic acid is normal, which is reassuring. Patient had transient maps less than 65, which is likely positional and is now self resolved. However, will continue aggressive fluids and  Rocephin. Will admit to hospitalists  Final Clinical  Impressions(s) / ED Diagnoses   Final diagnoses:  Complicated UTI (urinary tract infection)  Leukocytosis  Acute-on-chronic kidney injury [facility])    New Prescriptions New Prescriptions   No medications on file     Duffy Bruce, MD 02/18/16 1931

## 2016-02-18 NOTE — ED Notes (Signed)
Bed: WA21 Expected date:  Expected time:  Means of arrival:  Comments: EMS  

## 2016-02-18 NOTE — ED Notes (Signed)
2 IV start attempts 

## 2016-02-18 NOTE — H&P (Addendum)
Katie Dawson [id number] DOB: 04-12-1934 DOA: 02/18/2016     PCP: Cathlean Cower, MD   Outpatient Specialists: [facility] Patient coming from:  From facility [facility]  Chief Complaint: Lethargy  HPI: Katie Dawson is a 82 y.o. female with medical history significant of metastatic renal cell carcinoma on comfort care , dementia, recurrent UTIs, diabetes mellitus, CK D, depression, vertigo, sleep apnea, HTN    Presented with lethargy somewhat worsened her baseline. Patient has significant dementia and at baseline oriented to self able to verbalize and uses a wheelchair. At the retirement facility they noted the blood pressure was down to 9445 she was satting 96% on room air given somewhat worsening lethargy that her baseline EMS was called. Patient was transferred to emerge department   Regarding pertinent Chronic problems: She is currently followed by hospice for history of metastatic renal cell carcinoma. Patient was just recently discharged to the nursing home facility if admission for failure to thrive complicated by metastatic cancer. At that time she was also noted to have acute encephalopathy thought to be secondary to underlying dementia and polypharmacy her psychotropic medications were held. At that time her ammonia was checked and was within normal limits her B-12 level was low normal and she was given an injection at the time of discharge her Celexa and gabapentin was restarted CXR showing enlarging left lung mass CT scan showed extensive pulmonary metastases Guarding patient's history of diabetes Lantus dose has been recently decreased secondary to poor by mouth intake. Her glipizide was stopped   IN ER:  Temp (24hrs), Avg:98.3 F (36.8 C), Min:98.3 F (36.8 C), Max:98.3 F (36.8 C) RR 18 heart rate 90 blood pressure now 148/96   WBC 25.9 hemoglobin 9.9 which is at baseline sodium 141 BUN 52 creatinine 2.36 which is up from 1.17 one  week  ago  CT head unremarkable Chest x-ray persistent metastatic disease Following Medications were ordered in ER: Medications  sodium chloride 0.9 % bolus 1,000 mL (1,000 mLs Intravenous New Bag/Given 02/18/16 1715)  sodium chloride 0.9 % bolus 1,000 mL (1,000 mLs Intravenous New Bag/Given 02/18/16 1714)  cefTRIAXone (ROCEPHIN) 1 g in dextrose 5 % 50 mL IVPB (0 g Intravenous Stopped 02/18/16 1853)  sodium chloride 0.9 % bolus 1,000 mL (0 mLs Intravenous Stopped 02/18/16 1852)      Hospitalist was called for admission forUTI  Review of Systems:    Pertinent positives include: confusion  Constitutional:  No weight loss, night sweats, Fevers, chills, fatigue, weight loss  HEENT:  No headaches, Difficulty swallowing,Tooth/dental problems,Sore throat,  No sneezing, itching, ear ache, nasal congestion, post nasal drip,  Cardio-vascular:  No chest pain, Orthopnea, PND, anasarca, dizziness, palpitations.no Bilateral lower extremity swelling  GI:  No heartburn, indigestion, abdominal pain, nausea, vomiting, diarrhea, change in bowel habits, loss of appetite, melena, blood in stool, hematemesis Resp:  no shortness of breath at rest. No dyspnea on exertion, No excess mucus, no productive cough, No non-productive cough, No coughing up of blood.No change in color of mucus.No wheezing. Skin:  no rash or lesions. No jaundice GU:  no dysuria, change in color of urine, no urgency or frequency. No straining to urinate.  No flank pain.  Musculoskeletal:  No joint pain or no joint swelling. No decreased range of motion. No back pain.  Psych:  No change in mood or affect. No depression or anxiety. No memory loss.  Neuro: no localizing neurological complaints, no tingling, no weakness, no double vision,  no gait abnormality, no slurred speech, no   As per HPI otherwise 10 point review of systems negative.   Past Medical History: Past Medical History:  Diagnosis Date  . ANEMIA-IRON DEFICIENCY  04/22/2007  . ANXIETY 04/22/2007  . COLONIC POLYPS, HX OF 04/22/2007  . Dementia   . DIABETES MELLITUS, TYPE II 04/22/2007  . FOOT PAIN, LEFT 12/23/2007  . GERD 04/22/2007  . GOUT 04/22/2007  . HYPERLIPIDEMIA 04/22/2007  . HYPERSOMNIA 01/11/2009  . HYPERTENSION 04/22/2007  . INTERMITTENT VERTIGO 04/20/2009  . NEPHRECTOMY, HX OF 04/22/2007  . OBSTRUCTIVE SLEEP APNEA 04/22/2007  . 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 OSTEOPENIA 02/12/2009  . RENAL INSUFFICIENCY 04/22/2007  . Right fibular fracture July 2013  . Urinary tract infection    hx of   Past Surgical History:  Procedure Laterality Date  . ORIF WRIST FRACTURE  01/02/2012   Procedure: OPEN REDUCTION INTERNAL FIXATION (ORIF) WRIST FRACTURE;  Surgeon: Schuyler Amor, MD;  Location: [facility];  Service: Orthopedics;  Laterality: Right;  Open Reduction Internal Fixation Right Distal Radius   . s/p parathyroid surgury    . s/p right nephrectomy       Social History:  Ambulatory   wheelchair bound,     reports that she has never smoked. She has never used smokeless tobacco. She reports that she does not drink alcohol or use drugs.  Allergies:  No Known Allergies     Family History:  Family History  Problem Relation Age of Onset  . Heart disease Mother     Medications: Prior to Admission medications   Medication Sig Start Date End Date Taking? Authorizing Provider  acetaminophen (TYLENOL) 325 MG tablet Take 650 mg by mouth every 8 (eight) hours.   Yes Historical Provider, MD  amLODipine (NORVASC) 10 MG tablet Take 10 mg by mouth daily.   Yes Historical Provider, MD  aspirin EC 81 MG tablet Take 1 tablet (81 mg total) by mouth daily. 04/07/14  Yes Thurnell Lose, MD  atorvastatin (LIPITOR) 10 MG tablet Take 10 mg by mouth daily.   Yes Historical Provider, MD  B-D INS SYRINGE 0.5CC/31GX5/16 31G X 5/16" 0.5 ML MISC USE AS DIRECTED FOR INSULIN ADMINISTRATION. 01/01/16  Yes Biagio Borg, MD  BD PEN NEEDLE NANO U/F  32G X 4 MM MISC USE WITH LANTUS DAILY.   Yes Biagio Borg, MD  citalopram (CELEXA) 10 MG tablet Take 10 mg by mouth daily.   Yes Historical Provider, MD  cloNIDine (CATAPRES) 0.1 MG tablet Take 0.1 mg by mouth 2 (two) times daily.   Yes Historical Provider, MD  docusate (COLACE) 50 MG/5ML liquid Take 0.1 mg by mouth every 30 (thirty) days. In each ear once monthly. Wait for 10 minutes and then rinse   Yes Historical Provider, MD  donepezil (ARICEPT) 10 MG tablet TAKE ONE TABLET BY MOUTH EVERY DAY 02/12/16  Yes Biagio Borg, MD  gabapentin (NEURONTIN) 300 MG capsule TAKE ONE CAPSULE BY MOUTH THREE TIMES DAILY Patient taking differently: TAKE 300mg  CAPSULE BY MOUTH THREE TIMES DAILY 02/12/16  Yes Biagio Borg, MD  glucose blood (BAYER CONTOUR NEXT TEST) test strip Use as instructed 09/16/13  Yes Biagio Borg, MD  hydrALAZINE (APRESOLINE) 25 MG tablet TAKE ONE TABLET BY MOUTH EVERY 8 HOURS Patient taking differently: TAKE 25mg  TABLET BY MOUTH EVERY 8 HOURS 02/12/16  Yes Biagio Borg, MD  isosorbide mononitrate (IMDUR) 60 MG 24 hr tablet Take 60 mg by mouth daily.  Yes Historical Provider, MD  labetalol (NORMODYNE) 200 MG tablet Take 200 mg by mouth 2 (two) times daily.   Yes Historical Provider, MD  Lancets MISC Use as directed 1 per day 09/16/13  Yes Biagio Borg, MD  LANTUS 100 UNIT/ML injection INJECT 16 UNITS SUBCUTANEOUSLY EVERY DAY AT BEDTIME. 02/12/16  Yes Biagio Borg, MD  nystatin (MYCOSTATIN/NYSTOP) powder Apply 1 g topically 4 (four) times daily.   Yes Historical Provider, MD  promethazine (PHENERGAN) 12.5 MG tablet Take 12.5 mg by mouth every 12 (twelve) hours as needed for nausea or vomiting.   Yes Historical Provider, MD  risperiDONE (RISPERDAL) 0.5 MG tablet Take 0.5 mg by mouth at bedtime.   Yes Historical Provider, MD  NON-ASPIRIN PAIN RELIEF 325 MG tablet TAKE TWO TABLETS BY MOUTH EVERY EIGHT HOURS Patient not taking: Reported on 02/18/2016 02/12/16   Biagio Borg, MD    Physical  Exam: Patient Vitals for the past 24 hrs:  BP Temp Temp src Pulse Resp SpO2 Height Weight  02/18/16 1859 148/96 - - 90 18 100 % - -  02/18/16 1600 (!) 120/46 - - 63 - 94 % - -  02/18/16 1530 (!) 111/47 - - 65 - 94 % - -  02/18/16 1500 (!) 105/45 - - 65 - 93 % - -  02/18/16 1430 134/57 - - 65 - 95 % - -  02/18/16 1400 140/56 - - 67 - 99 % - -  02/18/16 1330 136/55 - - 66 - 99 % - -  02/18/16 1300 167/63 - - 66 13 100 % - -  02/18/16 1250 - - - - - 98 % 5\' 5"  (1.651 m) 85.3 kg (188 lb)  02/18/16 1236 124/60 98.3 F (36.8 C) Oral 64 18 97 % - -    1. General:  in No Acute distress 2. Psychological: Alert but not Oriented 3. Head/ENT:     Dry Mucous Membranes                          Head Non traumatic, neck supple                            Poor Dentition 4. SKIN:   decreased Skin turgor,  Skin clean Dry and intact no rash 5. Heart: Regular rate and rhythm  systolic Murmur, Rub or gallop 6. Lungs: no wheezes or crackles   7. Abdomen: Soft,  non-tender, Non distended 8. Lower extremities: no clubbing, cyanosis, or edema 9. Neurologically Grossly intact, moving all 4 extremities equally  10. MSK: Normal range of motion   body mass index is 31.28 kg/m.  Labs on Admission:   Labs on Admission: I have personally reviewed following labs and imaging studies  CBC:  Recent Labs Lab 02/18/16 1416  WBC 25.9*  NEUTROABS 23.9*  HGB 9.9*  HCT 31.3*  MCV 89.4  PLT 99991111   Basic Metabolic Panel:  Recent Labs Lab 02/18/16 1416  NA 141  K 4.1  CL 109  CO2 23  GLUCOSE 184*  BUN 52*  CREATININE 2.36*  CALCIUM 9.3   GFR: Estimated Creatinine Clearance: 20.8 mL/min (by C-G formula based on SCr of 2.36 mg/dL (H)). Liver Function Tests:  Recent Labs Lab 02/18/16 1416  AST 14*  ALT 22  ALKPHOS 87  BILITOT 0.6  PROT 6.7  ALBUMIN 3.0*   No results for input(s): LIPASE, AMYLASE in the  last 168 hours. No results for input(s): AMMONIA in the last 168 hours. Coagulation  Profile: No results for input(s): INR, PROTIME in the last 168 hours. Cardiac Enzymes:  Recent Labs Lab 02/18/16 1416  TROPONINI <0.03   BNP (last 3 results) No results for input(s): PROBNP in the last 8760 hours. HbA1C: No results for input(s): HGBA1C in the last 72 hours. CBG: No results for input(s): GLUCAP in the last 168 hours. Lipid Profile: No results for input(s): CHOL, HDL, LDLCALC, TRIG, CHOLHDL, LDLDIRECT in the last 72 hours. Thyroid Function Tests: No results for input(s): TSH, T4TOTAL, FREET4, T3FREE, THYROIDAB in the last 72 hours. Anemia Panel: No results for input(s): VITAMINB12, FOLATE, FERRITIN, TIBC, IRON, RETICCTPCT in the last 72 hours. Urine analysis:    Component Value Date/Time   COLORURINE AMBER (A) 02/18/2016 1334   APPEARANCEUR CLOUDY (A) 02/18/2016 1334   LABSPEC 1.013 02/18/2016 1334   PHURINE 6.0 02/18/2016 1334   GLUCOSEU NEGATIVE 02/18/2016 1334   GLUCOSEU NEGATIVE 02/10/2013 1024   HGBUR NEGATIVE 02/18/2016 1334   BILIRUBINUR NEGATIVE 02/18/2016 1334   KETONESUR NEGATIVE 02/18/2016 1334   PROTEINUR 100 (A) 02/18/2016 1334   UROBILINOGEN 1.0 04/05/2014 1220   NITRITE NEGATIVE 02/18/2016 1334   LEUKOCYTESUR MODERATE (A) 02/18/2016 1334   Sepsis Labs: @LABRCNTIP (procalcitonin:4,lacticidven:4) )No results found for this or any previous visit (from the past 240 hour(s)).    UA  evidence of UTI     Lab Results  Component Value Date   HGBA1C 6.5 01/04/2016    Estimated Creatinine Clearance: 20.8 mL/min (by C-G formula based on SCr of 2.36 mg/dL (H)).  BNP (last 3 results) No results for input(s): PROBNP in the last 8760 hours.   ECG REPORT  Independently reviewed Rate: 65  Rhythm: Normal sinus rhythm ST&T Change: No acute ischemic changes   [id number]  Filed Weights   02/18/16 1250  Weight: 85.3 kg (188 lb)     Cultures:    Component Value Date/Time   SDES URINE, RANDOM 02/04/2016 2106   SPECREQUEST NONE 02/04/2016 2106    CULT NO GROWTH Performed at [facility]  02/04/2016 2106   REPTSTATUS 02/06/2016 FINAL 02/04/2016 2106     Radiological Exams on Admission: Ct Head Wo Contrast  Result Date: 02/18/2016 CLINICAL DATA:  Lethargy.  Metastatic disease. EXAM: CT HEAD WITHOUT CONTRAST TECHNIQUE: Contiguous axial images were obtained from the base of the skull through the vertex without intravenous contrast. COMPARISON:  CT head 02/04/2016 FINDINGS: Brain: Moderate atrophy. Low density throughout the cerebral white matter is unchanged and appears chronic. Negative for mass lesion. No shift of the midline structures. Negative for hemorrhage. No acute infarct. Vascular: No hyperdense vessel or unexpected calcification. Skull: Normal. Negative for fracture or focal lesion. Sinuses/Orbits: No acute finding. Other: None. IMPRESSION: Atrophy and chronic white matter changes are stable. No superimposed acute abnormality. Electronically Signed   By: Franchot Gallo M.D.   On: 02/18/2016 17:44   Dg Chest Portable 1 View  Result Date: 02/18/2016 CLINICAL DATA:  Altered mental status. History of previous renal cell carcinoma. EXAM: PORTABLE CHEST 1 VIEW COMPARISON:  Chest CT April 06, 2016 FINDINGS: The previously noted mass in the left upper lobe near the apex is visualize, currently measuring 4.0 x 3.4 cm in size. There is no edema or consolidation. There is bilateral hilar adenopathy, more pronounced on the left than on the right, stable. There are S scattered nodular opacities in both lower lobes, better appreciated on recent CT than on  the current radiographic examination. No new opacity is evident. There is atherosclerotic calcification in the aortic arch region. There are postoperative clips in the midline cervical-thoracic region. No bone lesions are evident. IMPRESSION: Evidence of metastatic disease with lung masses and adenopathy, better characterized on recent chest CT. No new opacity evident. No airspace  consolidations suggestive of acute pneumonia. There is aortic atherosclerosis. Electronically Signed   By: Lowella Grip III M.D.   On: 02/18/2016 14:15    Chart has been reviewed    Assessment/Plan  82 y.o. female with medical history significant of metastatic renal cell carcinoma on comfort care , dementia, recurrent UTIs, diabetes mellitus, CK D, depression, vertigo, sleep apnea, HTN being admitted for UTI, acute encephalopathy, acute on chronic renal failure  Present on Admission:  . AKI (acute kidney injury) ([place]) in a setting of mild dehydration will administer IV fluids follow renal function and obtain urine electrolytes . Acute encephalopathy in the setting of UTI and apparently mild dehydration currently menstrual status improving we'll continue to monitor . ANEMIA-IRON DEFICIENCY chronic currently at baseline . UTI (urinary tract infection) resume Rocephin await results of urine culture . DM type 2 causing CKD stage 3 (HCC) Will order sliding scale continue to hold glipizide . Hypotension currently resolved but hold several blood pressure medications and reason continue clonidine anterior and labetalol with holding parameters blood pressure improved with IV fluid really since of dictation . Essential hypertension given some hypotension will hold home medications except for stated above History of metastatic renal cell carcinoma with significant metastatic spread to the lungs currently on hospice will need to discuss father with palliative care regarding CODE STATUS family not at bedside. Other plan as per orders.  DVT prophylaxis:     Lovenox     Code Status:  FULL CODE as per  family daughter states she would never want to make her door mother DO NOT RESUSCITATE secondary to prior experience with her father  Family Communication:   Family    at  Bedside Attempted to contact twice family by phone numbers [phone] and [phone] but no response.  Family came to bedside  and discussed at length prognosis and overall goals of care at this point interested in hospice Arrange for patient to go home but will need help from social work and case management to have this arranged  Disposition Plan:      Back to current facility when stable unless family is ready to take her home                         Social Work  Nutrition Palliative care   consulted                          Consults called: None  Admission status:  Observation   Level of care MedSurg           I have spent a total of 66 min on this admission extra time taken to discuss case with family Frank Pilger 02/18/2016, 10:16 PM    [facility]  Pager [phone]   after 2 AM please page floor coverage PA If 7AM-7PM, please contact the day team taking care of the patient  [email or web address]  Password [email or web address]

## 2016-02-18 NOTE — ED Notes (Signed)
Patient transported to CT 

## 2016-02-18 NOTE — Telephone Encounter (Signed)
Called [facility] spoke w/Amy she verified that referral was called in on Friday by Dr. Benay Spice. Inform her Dr. Jenny Reichmann will be the attending MD w/hospice MD help with symptom management. She stated they are just waiting to hear back from daughter to let them know when pt will be discharge from retirement home, and home w/daughter....Johny Chess

## 2016-02-18 NOTE — ED Notes (Signed)
Charge RN notified for ultrasound IV placement

## 2016-02-18 NOTE — ED Notes (Signed)
This Rn contacted [name], Pts daughter at [phone] to give update on Pt status.

## 2016-02-19 ENCOUNTER — Encounter (HOSPITAL_COMMUNITY): Payer: Self-pay | Admitting: Primary Care

## 2016-02-19 DIAGNOSIS — N390 Urinary tract infection, site not specified: Secondary | ICD-10-CM | POA: Diagnosis not present

## 2016-02-19 DIAGNOSIS — G9341 Metabolic encephalopathy: Secondary | ICD-10-CM | POA: Diagnosis present

## 2016-02-19 DIAGNOSIS — E870 Hyperosmolality and hypernatremia: Secondary | ICD-10-CM | POA: Diagnosis not present

## 2016-02-19 DIAGNOSIS — N179 Acute kidney failure, unspecified: Secondary | ICD-10-CM | POA: Diagnosis present

## 2016-02-19 DIAGNOSIS — K219 Gastro-esophageal reflux disease without esophagitis: Secondary | ICD-10-CM | POA: Diagnosis present

## 2016-02-19 DIAGNOSIS — F419 Anxiety disorder, unspecified: Secondary | ICD-10-CM | POA: Diagnosis present

## 2016-02-19 DIAGNOSIS — E43 Unspecified severe protein-calorie malnutrition: Secondary | ICD-10-CM | POA: Diagnosis present

## 2016-02-19 DIAGNOSIS — E11649 Type 2 diabetes mellitus with hypoglycemia without coma: Secondary | ICD-10-CM | POA: Diagnosis not present

## 2016-02-19 DIAGNOSIS — L89150 Pressure ulcer of sacral region, unstageable: Secondary | ICD-10-CM | POA: Diagnosis present

## 2016-02-19 DIAGNOSIS — R5383 Other fatigue: Secondary | ICD-10-CM | POA: Diagnosis not present

## 2016-02-19 DIAGNOSIS — D72829 Elevated white blood cell count, unspecified: Secondary | ICD-10-CM | POA: Diagnosis present

## 2016-02-19 DIAGNOSIS — E860 Dehydration: Secondary | ICD-10-CM | POA: Diagnosis present

## 2016-02-19 DIAGNOSIS — Z7189 Other specified counseling: Secondary | ICD-10-CM | POA: Diagnosis not present

## 2016-02-19 DIAGNOSIS — N183 Chronic kidney disease, stage 3 (moderate): Secondary | ICD-10-CM | POA: Diagnosis not present

## 2016-02-19 DIAGNOSIS — Z515 Encounter for palliative care: Secondary | ICD-10-CM

## 2016-02-19 DIAGNOSIS — N189 Chronic kidney disease, unspecified: Secondary | ICD-10-CM | POA: Diagnosis not present

## 2016-02-19 DIAGNOSIS — M79609 Pain in unspecified limb: Secondary | ICD-10-CM | POA: Diagnosis not present

## 2016-02-19 DIAGNOSIS — C649 Malignant neoplasm of unspecified kidney, except renal pelvis: Secondary | ICD-10-CM | POA: Diagnosis not present

## 2016-02-19 DIAGNOSIS — C7802 Secondary malignant neoplasm of left lung: Secondary | ICD-10-CM | POA: Diagnosis present

## 2016-02-19 DIAGNOSIS — I82621 Acute embolism and thrombosis of deep veins of right upper extremity: Secondary | ICD-10-CM | POA: Diagnosis not present

## 2016-02-19 DIAGNOSIS — G4733 Obstructive sleep apnea (adult) (pediatric): Secondary | ICD-10-CM | POA: Diagnosis present

## 2016-02-19 DIAGNOSIS — E1122 Type 2 diabetes mellitus with diabetic chronic kidney disease: Secondary | ICD-10-CM | POA: Diagnosis not present

## 2016-02-19 DIAGNOSIS — Z66 Do not resuscitate: Secondary | ICD-10-CM | POA: Diagnosis not present

## 2016-02-19 DIAGNOSIS — M7989 Other specified soft tissue disorders: Secondary | ICD-10-CM | POA: Diagnosis not present

## 2016-02-19 DIAGNOSIS — A498 Other bacterial infections of unspecified site: Secondary | ICD-10-CM | POA: Diagnosis not present

## 2016-02-19 DIAGNOSIS — I129 Hypertensive chronic kidney disease with stage 1 through stage 4 chronic kidney disease, or unspecified chronic kidney disease: Secondary | ICD-10-CM | POA: Diagnosis present

## 2016-02-19 DIAGNOSIS — R7881 Bacteremia: Secondary | ICD-10-CM | POA: Diagnosis not present

## 2016-02-19 DIAGNOSIS — F0390 Unspecified dementia without behavioral disturbance: Secondary | ICD-10-CM | POA: Diagnosis present

## 2016-02-19 DIAGNOSIS — I10 Essential (primary) hypertension: Secondary | ICD-10-CM | POA: Diagnosis not present

## 2016-02-19 DIAGNOSIS — E785 Hyperlipidemia, unspecified: Secondary | ICD-10-CM | POA: Diagnosis present

## 2016-02-19 DIAGNOSIS — I9589 Other hypotension: Secondary | ICD-10-CM | POA: Diagnosis present

## 2016-02-19 DIAGNOSIS — T8172XA Complication of vein following a procedure, not elsewhere classified, initial encounter: Secondary | ICD-10-CM | POA: Diagnosis not present

## 2016-02-19 DIAGNOSIS — G9340 Encephalopathy, unspecified: Secondary | ICD-10-CM | POA: Diagnosis not present

## 2016-02-19 DIAGNOSIS — B9620 Unspecified Escherichia coli [E. coli] as the cause of diseases classified elsewhere: Secondary | ICD-10-CM | POA: Diagnosis present

## 2016-02-19 DIAGNOSIS — C7800 Secondary malignant neoplasm of unspecified lung: Secondary | ICD-10-CM | POA: Diagnosis not present

## 2016-02-19 DIAGNOSIS — Y838 Other surgical procedures as the cause of abnormal reaction of the patient, or of later complication, without mention of misadventure at the time of the procedure: Secondary | ICD-10-CM | POA: Diagnosis not present

## 2016-02-19 LAB — COMPREHENSIVE METABOLIC PANEL
ALBUMIN: 2.3 g/dL — AB (ref 3.5–5.0)
ALT: 18 U/L (ref 14–54)
ANION GAP: 7 (ref 5–15)
AST: 13 U/L — AB (ref 15–41)
Alkaline Phosphatase: 86 U/L (ref 38–126)
BUN: 44 mg/dL — ABNORMAL HIGH (ref 6–20)
CO2: 22 mmol/L (ref 22–32)
Calcium: 8.6 mg/dL — ABNORMAL LOW (ref 8.9–10.3)
Chloride: 117 mmol/L — ABNORMAL HIGH (ref 101–111)
Creatinine, Ser: 1.67 mg/dL — ABNORMAL HIGH (ref 0.44–1.00)
GFR calc Af Amer: 33 mL/min — ABNORMAL LOW (ref >60)
GFR calc non Af Amer: 28 mL/min — ABNORMAL LOW (ref >60)
GLUCOSE: 118 mg/dL — AB (ref 65–99)
POTASSIUM: 3.6 mmol/L (ref 3.5–5.1)
SODIUM: 146 mmol/L — AB (ref 135–145)
Total Bilirubin: 0.3 mg/dL (ref 0.3–1.2)
Total Protein: 5.5 g/dL — ABNORMAL LOW (ref 6.5–8.1)

## 2016-02-19 LAB — BLOOD CULTURE ID PANEL (REFLEXED)
ACINETOBACTER BAUMANNII: NOT DETECTED
CANDIDA ALBICANS: NOT DETECTED
CANDIDA GLABRATA: NOT DETECTED
CANDIDA PARAPSILOSIS: NOT DETECTED
Candida krusei: NOT DETECTED
Candida tropicalis: NOT DETECTED
Carbapenem resistance: NOT DETECTED
ENTEROBACTER CLOACAE COMPLEX: NOT DETECTED
ENTEROBACTERIACEAE SPECIES: DETECTED — AB
ESCHERICHIA COLI: DETECTED — AB
Enterococcus species: NOT DETECTED
HAEMOPHILUS INFLUENZAE: NOT DETECTED
KLEBSIELLA PNEUMONIAE: NOT DETECTED
Klebsiella oxytoca: NOT DETECTED
Listeria monocytogenes: NOT DETECTED
Neisseria meningitidis: NOT DETECTED
PROTEUS SPECIES: NOT DETECTED
PSEUDOMONAS AERUGINOSA: NOT DETECTED
STREPTOCOCCUS AGALACTIAE: NOT DETECTED
STREPTOCOCCUS PYOGENES: NOT DETECTED
STREPTOCOCCUS SPECIES: NOT DETECTED
Serratia marcescens: NOT DETECTED
Staphylococcus aureus (BCID): NOT DETECTED
Staphylococcus species: NOT DETECTED
Streptococcus pneumoniae: NOT DETECTED

## 2016-02-19 LAB — PHOSPHORUS: PHOSPHORUS: 3.4 mg/dL (ref 2.5–4.6)

## 2016-02-19 LAB — CBC
HEMATOCRIT: 26.2 % — AB (ref 36.0–46.0)
HEMOGLOBIN: 8.5 g/dL — AB (ref 12.0–15.0)
MCH: 28.1 pg (ref 26.0–34.0)
MCHC: 32.4 g/dL (ref 30.0–36.0)
MCV: 86.8 fL (ref 78.0–100.0)
Platelets: 305 10*3/uL (ref 150–400)
RBC: 3.02 MIL/uL — ABNORMAL LOW (ref 3.87–5.11)
RDW: 14.6 % (ref 11.5–15.5)
WBC: 33.2 10*3/uL — ABNORMAL HIGH (ref 4.0–10.5)

## 2016-02-19 LAB — CREATININE, URINE, RANDOM: Creatinine, Urine: 53.86 mg/dL

## 2016-02-19 LAB — GLUCOSE, CAPILLARY
GLUCOSE-CAPILLARY: 88 mg/dL (ref 65–99)
GLUCOSE-CAPILLARY: 129 mg/dL — AB (ref 65–99)
GLUCOSE-CAPILLARY: 104 mg/dL — AB (ref 65–99)
Glucose-Capillary: 106 mg/dL — ABNORMAL HIGH (ref 65–99)
Glucose-Capillary: 110 mg/dL — ABNORMAL HIGH (ref 65–99)

## 2016-02-19 LAB — SODIUM, URINE, RANDOM: Sodium, Ur: 18 mmol/L

## 2016-02-19 LAB — TSH: TSH: 0.878 u[IU]/mL (ref 0.350–4.500)

## 2016-02-19 LAB — MAGNESIUM: MAGNESIUM: 1.5 mg/dL — AB (ref 1.7–2.4)

## 2016-02-19 MED ORDER — DEXTROSE-NACL 5-0.45 % IV SOLN
INTRAVENOUS | Status: DC
  Administered 2016-02-19: 17:00:00 via INTRAVENOUS

## 2016-02-19 MED ORDER — DEXTROSE 5 % IV SOLN
2.0000 g | INTRAVENOUS | Status: DC
  Administered 2016-02-20 – 2016-02-29 (×10): 2 g via INTRAVENOUS
  Filled 2016-02-19 (×10): qty 2

## 2016-02-19 MED ORDER — DEXTROSE-NACL 5-0.45 % IV SOLN
INTRAVENOUS | Status: DC
  Filled 2016-02-19: qty 1000

## 2016-02-19 NOTE — Progress Notes (Signed)
Received a message from [place] that daugther, Becky Sax wants to take patient home rather than return to ALF. Tried to call Becky Sax to make home arrangements and no answer, left VM. Call [name], other daughter, she states that they are not prepared to take patient home, they need DME and are waiting on hospice services. Discussed with her that hospice is a support service and that they would not be responsible for 24h care, offered resources for private duty. Contacted [facility] and they have been waiting for patient to d/c from ALF to do intake. Discussed with CSW will readdress with family if they can provide care at home with hospice or wish to return to ALF with hospice while they establish 24 care at home.

## 2016-02-19 NOTE — Progress Notes (Addendum)
[facility] PROGRESS NOTE    Progress Note  Katie Dawson  KNL:[id number] DOB: 1934/02/16 DOA: 02/18/2016 PCP: Cathlean Cower, MD     Brief Narrative:   Katie Dawson is an 82 y.o. female past medical history significant for metastatic renal cell carcinoma on comfort care with advanced dementia with recurrent UTIs comes in for lethargy.  Assessment/Plan:   Acute kidney injury: In the setting of a UTI and probably mild dehydration. Likely prerenal in etiology she was started on IV fluid hydration her creatinine has improved. Continue IV hydration check a basic metabolic panel in the morning.  Encephalopathy superimposed on advanced dementia in the setting of a UTI: Likely cause of her worsening encephalopathy is probably due to infectious etiology versus dehydration. Family are not grasping the degree of her dementia they do not realizes the terminal disease as well her malignancy.  UTI: She has a leukocytosis of 30,000, she was started on IV Rocephin. Encephalopathy seems to be unchanged. She has remained afebrile overnight. Her vitals continues to be stable. Continue IV Rocephin. Question in worsening leukocytosis due to malignancy.   Hypotension/essential hypertension: Currently resolved with holding antihypertensive medication. Continue IV fluid hydration.  History of metastatic renal cell carcinoma with lung metastases: She is currently under hospice care but is a full code as per daughter she would never want to her mother to be a DO NOT RESUSCITATE. Family does not grasp the progression and prognosis of her renal cell carcinoma, palliative Care met with family and there has been no change in their point of view  Diabetes mellitus type 2: Agree with holding glipizide continue sliding scale insulin. Blood glucose is well controlled.  ANEMIA-IRON DEFICIENCY       DVT prophylaxis: lovenox Family Communication:none Disposition Plan/Barrier to D/C: 2-3 days Code  Status:     Code Status Orders        Start     Ordered   02/18/16 2322  Full code  Continuous     02/18/16 2321    Code Status History    Date Active Date Inactive Code Status Order ID Comments User Context   02/05/2016 12:28 AM 02/09/2016 10:21 PM Full Code [id number]  [name], MD Inpatient   09/13/2015  3:45 PM 09/17/2015  8:36 PM Full Code [id number]  [name], PA-C ED   04/05/2014  6:19 PM 04/07/2014  3:46 PM Full Code [id number]  [name], MD Inpatient   10/16/2013  4:35 PM 10/20/2013  7:17 PM Full Code [id number]  [name], MD Inpatient   05/16/2013 12:05 AM 05/18/2013  8:59 PM Full Code [id number]  [name], MD Inpatient        IV Access:    Peripheral IV   Procedures and diagnostic studies:   Ct Head Wo Contrast  Result Date: 02/18/2016 CLINICAL DATA:  Lethargy.  Metastatic disease. EXAM: CT HEAD WITHOUT CONTRAST TECHNIQUE: Contiguous axial images were obtained from the base of the skull through the vertex without intravenous contrast. COMPARISON:  CT head 02/04/2016 FINDINGS: Brain: Moderate atrophy. Low density throughout the cerebral white matter is unchanged and appears chronic. Negative for mass lesion. No shift of the midline structures. Negative for hemorrhage. No acute infarct. Vascular: No hyperdense vessel or unexpected calcification. Skull: Normal. Negative for fracture or focal lesion. Sinuses/Orbits: No acute finding. Other: None. IMPRESSION: Atrophy and chronic white matter changes are stable. No superimposed acute abnormality. Electronically Signed   By: Franchot Gallo M.D.  On: 02/18/2016 17:44   Dg Chest Portable 1 View  Result Date: 02/18/2016 CLINICAL DATA:  Altered mental status. History of previous renal cell carcinoma. EXAM: PORTABLE CHEST 1 VIEW COMPARISON:  Chest CT April 06, 2016 FINDINGS: The previously noted mass in the left upper lobe near the apex is visualize, currently measuring 4.0 x 3.4 cm in size. There is  no edema or consolidation. There is bilateral hilar adenopathy, more pronounced on the left than on the right, stable. There are S scattered nodular opacities in both lower lobes, better appreciated on recent CT than on the current radiographic examination. No new opacity is evident. There is atherosclerotic calcification in the aortic arch region. There are postoperative clips in the midline cervical-thoracic region. No bone lesions are evident. IMPRESSION: Evidence of metastatic disease with lung masses and adenopathy, better characterized on recent chest CT. No new opacity evident. No airspace consolidations suggestive of acute pneumonia. There is aortic atherosclerosis. Electronically Signed   By: Lowella Grip III M.D.   On: 02/18/2016 14:15     Medical Consultants:    None.  Anti-Infectives:   Rocephin  Subjective:    Katie Dawson nonverbal today.  Objective:    Vitals:   02/19/16 0023 02/19/16 0242 02/19/16 0533 02/19/16 0925  BP: (!) 121/43 (!) 113/36 (!) 125/46 (!) 148/65  Pulse: 84 75 74 76  Resp: _0 Temp: 99.6 F (37.6 C) 99.3 F (37.4 C) 98.2 F (36.8 C) 97.6 F (36.4 C)  TempSrc: Axillary Axillary Axillary Oral  SpO2: 95% 100% 99% 98%  Weight:      Height:        Intake/Output Summary (Last 24 hours) at 02/19/16 1121 Last data filed at 02/19/16 0533  Gross per 24 hour  Intake             3890 ml  Output             1050 ml  Net             2840 ml   Filed Weights   02/18/16 1250  Weight: 85.3 kg (188 lb)    Exam: General exam: In no acute distress. Respiratory system: Good air movement and clear to auscultation. Cardiovascular system: S1 & S2 heard, RRR. No JVD, murmurs, rubs, gallops or clicks.  Gastrointestinal system: Abdomen is nondistended, soft and nontender.  Central nervous system: Does not follow commands Extremities: No pedal edema. Skin: No rashes, lesions or ulcers    Data Reviewed:    Labs: Basic Metabolic  Panel:  Recent Labs Lab 02/18/16 1416 02/19/16 0448  NA 141 146*  K 4.1 3.6  CL 109 117*  CO2 23 22  GLUCOSE 184* 118*  BUN 52* 44*  CREATININE 2.36* 1.67*  CALCIUM 9.3 8.6*  MG  --  1.5*  PHOS  --  3.4   GFR Estimated Creatinine Clearance: 29.5 mL/min (by C-G formula based on SCr of 1.67 mg/dL (H)). Liver Function Tests:  Recent Labs Lab 02/18/16 1416 02/19/16 0448  AST 14* 13*  ALT 22 18  ALKPHOS 87 86  BILITOT 0.6 0.3  PROT 6.7 5.5*  ALBUMIN 3.0* 2.3*   No results for input(s): LIPASE, AMYLASE in the last 168 hours. No results for input(s): AMMONIA in the last 168 hours. Coagulation profile No results for input(s): INR, PROTIME in the last 168 hours.  CBC:  Recent Labs Lab 02/18/16 1416 02/19/16 0448  WBC 25.9* 33.2*  NEUTROABS 23.9*  --  HGB 9.9* 8.5*  HCT 31.3* 26.2*  MCV 89.4 86.8  PLT 365 305   Cardiac Enzymes:  Recent Labs Lab 02/18/16 1416  TROPONINI <0.03   BNP (last 3 results) No results for input(s): PROBNP in the last 8760 hours. CBG:  Recent Labs Lab 02/19/16 0049 02/19/16 0844  GLUCAP 110* 88   D-Dimer: No results for input(s): DDIMER in the last 72 hours. Hgb A1c: No results for input(s): HGBA1C in the last 72 hours. Lipid Profile: No results for input(s): CHOL, HDL, LDLCALC, TRIG, CHOLHDL, LDLDIRECT in the last 72 hours. Thyroid function studies:  Recent Labs  02/19/16 0448  TSH 0.878   Anemia work up: No results for input(s): VITAMINB12, FOLATE, FERRITIN, TIBC, IRON, RETICCTPCT in the last 72 hours. Sepsis Labs:  Recent Labs Lab 02/18/16 1416 02/18/16 1426 02/18/16 1630 02/19/16 0448  WBC 25.9*  --   --  33.2*  LATICACIDVEN  --  0.90 1.30  --    Microbiology Recent Results (from the past 240 hour(s))  Blood culture (routine x 2)     Status: None (Preliminary result)   Collection Time: 02/18/16  4:20 PM  Result Value Ref Range Status   Specimen Description RIGHT ANTECUBITAL  Final   Special Requests  BOTTLES DRAWN AEROBIC ONLY 5CC  Final   Culture   Final    NO GROWTH < 24 HOURS Performed at [facility]    Report Status PENDING  Incomplete  Blood culture (routine x 2)     Status: None (Preliminary result)   Collection Time: 02/18/16  5:00 PM  Result Value Ref Range Status   Specimen Description BLOOD LEFT ANTECUBITAL  Final   Special Requests IN PEDIATRIC BOTTLE 2 CC  Final   Culture   Final    NO GROWTH < 24 HOURS Performed at [facility]    Report Status PENDING  Incomplete     Medications:   . aspirin EC  81 mg Oral Daily  . atorvastatin  10 mg Oral Daily  . cefTRIAXone (ROCEPHIN)  IV  1 g Intravenous Q24H  . citalopram  10 mg Oral Daily  . cloNIDine  0.1 mg Oral BID  . docusate  0.1 mg Oral Q30 days  . donepezil  10 mg Oral Daily  . enoxaparin (LOVENOX) injection  30 mg Subcutaneous QHS  . gabapentin  300 mg Oral TID  . insulin aspart  0-5 Units Subcutaneous QHS  . insulin aspart  0-9 Units Subcutaneous TID WC  . insulin glargine  10 Units Subcutaneous QHS  . isosorbide mononitrate  60 mg Oral Daily  . labetalol  100 mg Oral BID  . nystatin  1 g Topical QID  . risperiDONE  0.5 mg Oral QHS   Continuous Infusions:   Time spent: 25 min   LOS: 0 days   Charlynne Cousins  [facility] Pager [phone]  *Please refer to [email or web address], password [email or web address] to get updated schedule on who will round on this patient, as hospitalists switch teams weekly. If 7PM-7AM, please contact night-coverage at [email or web address], password [email or web address] for any overnight needs.  02/19/2016, 11:21 AM

## 2016-02-19 NOTE — Progress Notes (Signed)
Patient was too sleepy and lethargic and wont take her schedule medications.

## 2016-02-19 NOTE — NC FL2 (Addendum)
[place] LEVEL OF CARE SCREENING TOOL     IDENTIFICATION  Patient Name: Katie Dawson Birthdate: 12-27-1933 Sex: female Admission Date (Current Location): 02/18/2016  [facility] [place] and [place] Number:  Herbalist and Address:  [facility],  [address], [place]      Provider Number: [id number]  Attending Physician Name and Address:  Charlynne Cousins, MD  Relative Name and Phone Number:  [name], daughter, [phone]    Current Level of Care: Hospital Recommended Level of Care: [place] Prior Approval Number:    Date Approved/Denied:   PASRR Number:    Discharge Plan:  SNF    Current Diagnoses: Patient Active Problem List   Diagnosis Date Noted  . DM type 2 causing CKD stage 3 ([place]) 02/18/2016  . Hypotension 02/18/2016  . Metastatic cancer to lung ([place]) 02/08/2016  . Acute encephalopathy   . AKI (acute kidney injury) ([place])   . External hemorrhoid, thrombosed 01/04/2016  . Hematochezia 01/04/2016  . Syncope 10/07/2015  . Lethargy 09/13/2015  . Dizziness 04/05/2014  . Abnormal urine odor 02/07/2014  . UTI (urinary tract infection) 10/17/2013  . Hypoglycemia 10/16/2013  . Heart murmur 09/16/2013  . Agitation 09/16/2013  . Back pain 05/18/2013  . Altered mental state 05/15/2013  . Lower extremity weakness 05/15/2013  . ARF (acute renal failure) ([place]) 05/15/2013  . Leucocytosis 05/15/2013  . Peripheral neuropathy ([place]) 05/15/2013  . Aortic stenosis 02/10/2013  . Dementia 02/10/2013  . Vitamin D deficiency 04/10/2012  . Depression 04/10/2012  . Confusion 04/08/2012  . Closed fibular fracture 12/24/2011  . Wrist fracture, closed 12/24/2011  . Fatigue 11/05/2010  . Preventative health care 11/03/2010  . INTERMITTENT VERTIGO 04/20/2009  . OSTEOPENIA 02/12/2009  . FOOT PAIN, LEFT 12/23/2007  . Insulin dependent diabetes mellitus ([place]) 04/22/2007  . HLD (hyperlipidemia) 04/22/2007  . GOUT  04/22/2007  . ANEMIA-IRON DEFICIENCY 04/22/2007  . ANXIETY 04/22/2007  . Sleep apnea 04/22/2007  . Essential hypertension 04/22/2007  . GERD 04/22/2007  . Disorder resulting from impaired renal function 04/22/2007  . 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 COLONIC POLYPS, HX OF 04/22/2007  . NEPHRECTOMY, HX OF 04/22/2007    Orientation RESPIRATION BLADDER Height & Weight     Self  Normal Incontinent Indwelling [place] Weight: 188 lb (85.3 kg) Height:  5\' 5"  (165.1 cm)  BEHAVIORAL SYMPTOMS/MOOD NEUROLOGICAL BOWEL NUTRITION STATUS      Incontinent  Diet: Carb Modified   AMBULATORY STATUS COMMUNICATION OF NEEDS Skin   Total Care Verbally Stage 3 Pressure Ulcer                        Personal Care Assistance Level of Assistance    Bathing Assistance: Maximum assistance Feeding assistance: Limited assistance Dressing Assistance: Maximum assistance Total Care Assistance: Maximum assistance   Functional Limitations Info             SPECIAL CARE FACTORS FREQUENCY                       Contractures Contractures Info: Not present    Additional Factors Info    Code Status Info: Full Code             Current Medications (02/19/2016):  This is the current hospital active medication list Current Facility-Administered Medications  Medication Dose Route Frequency Provider Last Rate Last Dose  . acetaminophen (TYLENOL) tablet 650 mg  650 mg Oral  Q6H PRN Toy Baker, MD       Or  . acetaminophen (TYLENOL) suppository 650 mg  650 mg Rectal Q6H PRN Toy Baker, MD      . aspirin EC tablet 81 mg  81 mg Oral Daily Toy Baker, MD      . atorvastatin (LIPITOR) tablet 10 mg  10 mg Oral Daily Toy Baker, MD      . cefTRIAXone (ROCEPHIN) 1 g in dextrose 5 % 50 mL IVPB  1 g Intravenous Q24H Toy Baker, MD      . citalopram (CELEXA) tablet 10 mg  10 mg Oral Daily Toy Baker, MD   10 mg at 02/19/16 1033  . cloNIDine  (CATAPRES) tablet 0.1 mg  0.1 mg Oral BID Toy Baker, MD      . docusate (COLACE) 50 MG/5ML liquid 0.1 mg  0.1 mg Oral Q30 days Toy Baker, MD      . donepezil (ARICEPT) tablet 10 mg  10 mg Oral Daily Toy Baker, MD      . enoxaparin (LOVENOX) injection 30 mg  30 mg Subcutaneous QHS Toy Baker, MD   30 mg at 02/19/16 0050  . gabapentin (NEURONTIN) capsule 300 mg  300 mg Oral TID Toy Baker, MD   300 mg at 02/19/16 1033  . HYDROcodone-acetaminophen (NORCO/VICODIN) 5-325 MG per tablet 1-2 tablet  1-2 tablet Oral Q4H PRN Toy Baker, MD      . insulin aspart (novoLOG) injection 0-5 Units  0-5 Units Subcutaneous QHS Anastassia Doutova, MD      . insulin aspart (novoLOG) injection 0-9 Units  0-9 Units Subcutaneous TID WC Toy Baker, MD   1 Units at 02/19/16 1251  . insulin glargine (LANTUS) injection 10 Units  10 Units Subcutaneous QHS Toy Baker, MD   10 Units at 02/19/16 0050  . isosorbide mononitrate (IMDUR) 24 hr tablet 60 mg  60 mg Oral Daily Toy Baker, MD      . labetalol (NORMODYNE) tablet 100 mg  100 mg Oral BID Toy Baker, MD      . nystatin (MYCOSTATIN/NYSTOP) topical powder 1 g  1 g Topical QID Toy Baker, MD   1 g at 02/19/16 1251  . ondansetron (ZOFRAN) tablet 4 mg  4 mg Oral Q6H PRN Toy Baker, MD       Or  . ondansetron (ZOFRAN) injection 4 mg  4 mg Intravenous Q6H PRN Toy Baker, MD      . risperiDONE (RISPERDAL) tablet 0.5 mg  0.5 mg Oral QHS Toy Baker, MD         Discharge Medications: Please see discharge summary for a list of discharge medications.  Relevant Imaging Results:  Relevant Lab Results:   Additional Information [id number]  Lia Hopping, LCSW

## 2016-02-19 NOTE — Progress Notes (Addendum)
Updated FL2.  LCSWA spoke with [facility] Rashida, informed her about patient expected disposition with Hospice and updated FL2. She provided LCSWA with appropriate fax number [phone].

## 2016-02-19 NOTE — Progress Notes (Addendum)
LCSWA contacted patient family to assist with patient disposition back to ALF. Patient daughters report at this time they want the patient to go home. She reports the patient has been diagnosed with "stage 4" and  want her close to family. The patient is followed by Hospice. LCSWA notified RNCM of family decision.  UPDATE: RNCM reports family is still undecided.  LCSWA completed FL2 for ALF.

## 2016-02-19 NOTE — Progress Notes (Signed)
MD was notified and orders was given . 500 cc iv bolus Insert foley catheter Wound consult.  >a 16 F foley catheter was successfully inserted ,patient tolerated the procedure and we will continue to monitor.

## 2016-02-19 NOTE — Progress Notes (Signed)
Received the patient from ED nurse and was given a bedside report, states that the patient is incontinent but they were not able to check her bottom . Upon assessment  a  mal odorous, weeping  pressure ulcer was noted on the patients's sacral area. Nurse Archie Patten was able to put a temporary dressing to cover the area, and notified on call MD.

## 2016-02-19 NOTE — Consult Note (Signed)
Consultation Note Date: 02/19/2016   Patient Name: Katie Dawson  DOB: [birth year]  MRN: [id number]  Age / Sex: 82 y.o., female  PCP: Biagio Borg, MD Referring Physician: Charlynne Cousins, MD  Reason for Consultation: Disposition, Establishing goals of care, Hospice Evaluation and Psychosocial/spiritual support  HPI/Patient Profile: 82 y.o. female  with past medical history of dementia, Jerrye Bushy, doubt, hypertension, hyperlipidemia, history of neck for ectomy due to cancer in 2008, obstructive sleep apnea, history of UTIs, diabetes type II, anxiety, anemia admitted on 02/18/2016 with UTI, and metastatic renal cell carcinoma extensive pulmonary metastasis, enlarging (mass.   Clinical Assessment and Goals of Care: Mrs. Rohwer is resting quietly in bed. She'll open her eyes and make eye contact but then close her eyes. She denies pain at this time. Her daughter Helayne Seminole arrives, and we discuss Mrs. Culmer's health concerns. We talk about Mrs. Levinson's living situation, [facility] for the last year and half. We also talk about Mrs. Lavine's cancer burden. Davy Pique states that they were unaware until recently that their mother had a nephrectomy due to cancer. Family also states that the metastatic cancer to lung is also a relatively new diagnosis for them.   Stacy, Education officer, museum, from hospice and palliative of [facility]. Sonya calls her sister [name] for a phone conference.  We talk about disposition, and daughters agree their best option at this time is to return to [facility], with the benefits of hospice and palliative of [place]. Marzetta Board discusses equipment needs and benefits including help with Education officer, museum for placement in home when able.  We talk about advanced directives, both [name] and Sonya become tearful. They relate a story related to their father's death, of  not attempting CPR, and their subsequent guilt. We talk about the cancer diagnosis, that CPR or intubation will not change what's happening with her cancer or her dementia. [name] states she feels she needs counseling regarding the loss of her father. I share that hospice would be happy to help with this counseling through their chaplain and social worker.  I leave Marzetta Board to continue answering Sonya's questions.  Healthcare power of attorney HCPOA - daughter Marikay Alar, she states that she makes decisions with the help of her sister Donnetta Hail.    SUMMARY OF RECOMMENDATIONS   Aldona Bar and Alleen Borne state their goal is for Mrs. poquette to return to [facility] with benefits of hospice and palliative of [employer]. Their ultimate goal is to return their mother to granddaughter Karsten Fells Parker's home on. [address] They are working to make sure they have around-the-clock care prior to disposition to home.  Code Status/Advance Care Planning:  Full code Lauro Franklin and I discuss code status. Aldona Bar and Estill Bamberg are both tearful and relying their experience of end-of-life with their father. They did not attempt resuscitation and now feel guilt. I share that when Mrs. Laminack's time has come to pass there's nothing we can do to stop that.  I share  that there's also nothing we can do to change her cancer burden.  Symptom Management:   per hospitalist  Palliative Prophylaxis:   Frequent Pain Assessment, Palliative Wound Care and Turn Reposition  Additional Recommendations (Limitations, Scope, Preferences):  At this point, daughters cannot decline CPR or intubation. I share that hospice will continue to work with them around these choices.  Psycho-social/Spiritual:   Desire for further Chaplaincy support:no  Additional Recommendations: Caregiving  Support/Resources, Education on Hospice and Grief/Bereavement Support  Prognosis:   < 6 months, quite likely 3 months or less based  on functional decline related to dementia, metastatic cancer burden.  Discharge Planning: Return to [facility], ALF or she has been residing for the last year and a half, with the benefits of hospice and palliative of [place].      Primary Diagnoses: Present on Admission: . Essential hypertension . AKI (acute kidney injury) ([place]) . Acute encephalopathy . ANEMIA-IRON DEFICIENCY . UTI (urinary tract infection) . DM type 2 causing CKD stage 3 ([place]) . Hypotension   I have reviewed the medical record, interviewed the patient and family, and examined the patient. The following aspects are pertinent.  Past Medical History:  Diagnosis Date  . ANEMIA-IRON DEFICIENCY 04/22/2007  . ANXIETY 04/22/2007  . COLONIC POLYPS, HX OF 04/22/2007  . Dementia   . DIABETES MELLITUS, TYPE II 04/22/2007  . FOOT PAIN, LEFT 12/23/2007  . GERD 04/22/2007  . GOUT 04/22/2007  . HYPERLIPIDEMIA 04/22/2007  . HYPERSOMNIA 01/11/2009  . HYPERTENSION 04/22/2007  . INTERMITTENT VERTIGO 04/20/2009  . NEPHRECTOMY, HX OF 04/22/2007  . OBSTRUCTIVE SLEEP APNEA 04/22/2007  . 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 OSTEOPENIA 02/12/2009  . RENAL INSUFFICIENCY 04/22/2007  . Right fibular fracture July 2013  . Urinary tract infection    hx of   Social History   Social History  . Marital status: Single    Spouse name: N/A  . Number of children: 0  . Years of education: N/A   Occupational History  . retired - previous. worked as a nurse's aid Retired   Social History Main Topics  . Smoking status: Never Smoker  . Smokeless tobacco: Never Used  . Alcohol use No  . Drug use: No  . Sexual activity: No   Other Topics Concern  . None   Social History Narrative  . None   Family History  Problem Relation Age of Onset  . Heart disease Mother    Scheduled Meds: . aspirin EC  81 mg Oral Daily  . atorvastatin  10 mg Oral Daily  . cefTRIAXone (ROCEPHIN)  IV  1 g Intravenous Q24H    . citalopram  10 mg Oral Daily  . cloNIDine  0.1 mg Oral BID  . docusate  0.1 mg Oral Q30 days  . donepezil  10 mg Oral Daily  . enoxaparin (LOVENOX) injection  30 mg Subcutaneous QHS  . gabapentin  300 mg Oral TID  . insulin aspart  0-5 Units Subcutaneous QHS  . insulin aspart  0-9 Units Subcutaneous TID WC  . insulin glargine  10 Units Subcutaneous QHS  . isosorbide mononitrate  60 mg Oral Daily  . labetalol  100 mg Oral BID  . nystatin  1 g Topical QID  . risperiDONE  0.5 mg Oral QHS   Continuous Infusions:  PRN Meds:.acetaminophen **OR** acetaminophen, HYDROcodone-acetaminophen, ondansetron **OR** ondansetron (ZOFRAN) IV Medications Prior to Admission:  Prior to Admission medications   Medication Sig Start Date End Date Taking? Authorizing Provider  acetaminophen (TYLENOL) 325 MG tablet Take 650 mg by mouth every 8 (eight) hours.   Yes Historical Provider, MD  amLODipine (NORVASC) 10 MG tablet Take 10 mg by mouth daily.   Yes Historical Provider, MD  aspirin EC 81 MG tablet Take 1 tablet (81 mg total) by mouth daily. 04/07/14  Yes Thurnell Lose, MD  atorvastatin (LIPITOR) 10 MG tablet Take 10 mg by mouth daily.   Yes Historical Provider, MD  B-D INS SYRINGE 0.5CC/31GX5/16 31G X 5/16" 0.5 ML MISC USE AS DIRECTED FOR INSULIN ADMINISTRATION. 01/01/16  Yes Biagio Borg, MD  BD PEN NEEDLE NANO U/F 32G X 4 MM MISC USE WITH LANTUS DAILY.   Yes Biagio Borg, MD  citalopram (CELEXA) 10 MG tablet Take 10 mg by mouth daily.   Yes Historical Provider, MD  cloNIDine (CATAPRES) 0.1 MG tablet Take 0.1 mg by mouth 2 (two) times daily.   Yes Historical Provider, MD  docusate (COLACE) 50 MG/5ML liquid Take 0.1 mg by mouth every 30 (thirty) days. In each ear once monthly. Wait for 10 minutes and then rinse   Yes Historical Provider, MD  donepezil (ARICEPT) 10 MG tablet TAKE ONE TABLET BY MOUTH EVERY DAY 02/12/16  Yes Biagio Borg, MD  gabapentin (NEURONTIN) 300 MG capsule TAKE ONE CAPSULE BY MOUTH  THREE TIMES DAILY Patient taking differently: TAKE 300mg  CAPSULE BY MOUTH THREE TIMES DAILY 02/12/16  Yes Biagio Borg, MD  glucose blood (BAYER CONTOUR NEXT TEST) test strip Use as instructed 09/16/13  Yes Biagio Borg, MD  hydrALAZINE (APRESOLINE) 25 MG tablet TAKE ONE TABLET BY MOUTH EVERY 8 HOURS Patient taking differently: TAKE 25mg  TABLET BY MOUTH EVERY 8 HOURS 02/12/16  Yes Biagio Borg, MD  isosorbide mononitrate (IMDUR) 60 MG 24 hr tablet Take 60 mg by mouth daily.    Yes Historical Provider, MD  labetalol (NORMODYNE) 200 MG tablet Take 200 mg by mouth 2 (two) times daily.   Yes Historical Provider, MD  Lancets MISC Use as directed 1 per day 09/16/13  Yes Biagio Borg, MD  LANTUS 100 UNIT/ML injection INJECT 16 UNITS SUBCUTANEOUSLY EVERY DAY AT BEDTIME. 02/12/16  Yes Biagio Borg, MD  nystatin (MYCOSTATIN/NYSTOP) powder Apply 1 g topically 4 (four) times daily.   Yes Historical Provider, MD  promethazine (PHENERGAN) 12.5 MG tablet Take 12.5 mg by mouth every 12 (twelve) hours as needed for nausea or vomiting.   Yes Historical Provider, MD  risperiDONE (RISPERDAL) 0.5 MG tablet Take 0.5 mg by mouth at bedtime.   Yes Historical Provider, MD  NON-ASPIRIN PAIN RELIEF 325 MG tablet TAKE TWO TABLETS BY MOUTH EVERY EIGHT HOURS Patient not taking: Reported on 02/18/2016 02/12/16   Biagio Borg, MD   No Known Allergies Review of Systems  Unable to perform ROS: Dementia    Physical Exam  Constitutional: No distress.  Frail, chronically ill appearing  HENT:  Head: Normocephalic and atraumatic.  Cardiovascular: Normal rate and regular rhythm.   Pulmonary/Chest: Effort normal. No respiratory distress.  Abdominal: Soft.  Obese abdomen  Neurological: She is alert.  Demented, opens eyes will make and keep eye contact  Skin: Skin is warm and dry.  Sacral and bilateral hip breakdown  Nursing note and vitals reviewed.   Vital Signs: BP (!) 148/65 (BP Location: Left Arm)   Pulse 76   Temp 97.6 F  (36.4 C) (Oral)   Resp 15   Ht 5\' 5"  (1.651 m)   Wt 85.3  kg (188 lb)   SpO2 98%   BMI 31.28 kg/m  Pain Assessment: PAINAD (if pt is touched pt states "that hurts" )   Pain Score: Asleep   SpO2: SpO2: 98 % O2 Device:SpO2: 98 % O2 Flow Rate: .   IO: Intake/output summary:  Intake/Output Summary (Last 24 hours) at 02/19/16 1524 Last data filed at 02/19/16 0533  Gross per 24 hour  Intake             3890 ml  Output             1050 ml  Net             2840 ml    LBM: Last BM Date:  (PTA) Baseline Weight: Weight: 85.3 kg (188 lb) Most recent weight: Weight: 85.3 kg (188 lb)     Palliative Assessment/Data:   Flowsheet Rows   Flowsheet Row Most Recent Value  Intake Tab  Referral Department  Hospitalist  Unit at Time of Referral  Med/Surg Unit  Palliative Care Primary Diagnosis  Cancer  Date Notified  02/18/16  Date of Admission  02/18/16  Date first seen by Palliative Care  02/19/16  # of days Palliative referral response time  1 Day(s)  # of days IP prior to Palliative referral  0  Clinical Assessment  Palliative Performance Scale Score  30%  Pain Max last 24 hours  Not able to report  Pain Min Last 24 hours  Not able to report  Dyspnea Max Last 24 Hours  Not able to report  Dyspnea Min Last 24 hours  Not able to report  Psychosocial & Spiritual Assessment  Palliative Care Outcomes  Patient/Family meeting held?  Yes  Who was at the meeting?  daughter Becky Sax  Palliative Care Outcomes  Provided end of life care assistance, Provided advance care planning, Provided psychosocial or spiritual support, Counseled regarding hospice  Palliative Care follow-up planned  -- [follow up while at [facility]]      Time In: 1405 Time Out: 1535 Time Total: 80 Minutes Greater than 50%  of this time was spent counseling and coordinating care related to the above assessment and plan. Causes care discussion shared with nursing staff, case manager Vinnie Level, and Dr. Aileen Fass. Signed  by: Drue Novel, NP   Please contact Palliative Medicine Team phone at [phone] for questions and concerns.  For individual provider: See Shea Evans

## 2016-02-19 NOTE — Progress Notes (Signed)
  PHARMACY - PHYSICIAN COMMUNICATION CRITICAL VALUE ALERT - BLOOD CULTURE IDENTIFICATION (BCID)  Results for orders placed or performed during the hospital encounter of 02/18/16  Blood Culture ID Panel (Reflexed) (Collected: 02/18/2016  4:20 PM)  Result Value Ref Range   Enterococcus species NOT DETECTED NOT DETECTED   Listeria monocytogenes NOT DETECTED NOT DETECTED   Staphylococcus species NOT DETECTED NOT DETECTED   Staphylococcus aureus NOT DETECTED NOT DETECTED   Streptococcus species NOT DETECTED NOT DETECTED   Streptococcus agalactiae NOT DETECTED NOT DETECTED   Streptococcus pneumoniae NOT DETECTED NOT DETECTED   Streptococcus pyogenes NOT DETECTED NOT DETECTED   Acinetobacter baumannii NOT DETECTED NOT DETECTED   Enterobacteriaceae species DETECTED (A) NOT DETECTED   Enterobacter cloacae complex NOT DETECTED NOT DETECTED   Escherichia coli DETECTED (A) NOT DETECTED   Klebsiella oxytoca NOT DETECTED NOT DETECTED   Klebsiella pneumoniae NOT DETECTED NOT DETECTED   Proteus species NOT DETECTED NOT DETECTED   Serratia marcescens NOT DETECTED NOT DETECTED   Carbapenem resistance NOT DETECTED NOT DETECTED   Haemophilus influenzae NOT DETECTED NOT DETECTED   Neisseria meningitidis NOT DETECTED NOT DETECTED   Pseudomonas aeruginosa NOT DETECTED NOT DETECTED   Candida albicans NOT DETECTED NOT DETECTED   Candida glabrata NOT DETECTED NOT DETECTED   Candida krusei NOT DETECTED NOT DETECTED   Candida parapsilosis NOT DETECTED NOT DETECTED   Candida tropicalis NOT DETECTED NOT DETECTED    Name of physician (or Provider) Contacted: K. Schorr  Changes to prescribed antibiotics required:  Increase Ceftriaxone 1g IV q24h --> 2g IV q24h for bacteremia coverage + UTI  Ralene Bathe, PharmD, BCPS 02/19/2016, 7:46 PM  Pager: [phone]

## 2016-02-19 NOTE — Consult Note (Signed)
[place] Nurse wound consult note Reason for Consult: Unstageable pressure injury to the sacrum, bilateral hips, heels intact.  [facility], has metastatic renal cell carcinoma.  Patient with dementia and is somewhat lethargic today. Wound type:Pressure  Pressure Ulcer POA: Yes Measurement:Two areas:  Central sacral Unstageable pressure injury measures 4.5cm x 3cm and is purple/maroon in color, consistent with a deep tissue pressure injury.  No fluctuance, no eschar at this time.  Second area is located at 7 o'clock to the central injury and measures 2cm x 3.5cm and is a purple discoloration, also consistent with deep tissue pressure injury.  Patient reports discomfort when this area is measured.  It is anticipated that this area is very likely to collapse and become a crater lesion (Stage 3 or Stage 4); depending on length of time for patient's survival, given disease process, bedbound status, nutritional intake and moisture in the area of involvement. Wound bed:As described above Drainage (amount, consistency, odor)None  Periwound:Intact, dry Dressing procedure/placement/frequency: I will provide guidance for Nursing via the Orders for side to side turning and repositioning, patient is to have time minimized in the supine position.  Daily care for the skin injury will be to cleanse the area with NS, and then cover with a silicone foam dressing.  At least a component of the skin injury is resultant from shear forces; silicone foam will allow for tissue to glide instead of drag beneath dressing as staff turn and reposition her both at home and in a skilled facility.  Bilateral heel boots are provided to the intact heels to off load/float and will prevent pressure injury. A therapeutic mattress with low air loss feature is provided. [place] nursing team will not follow, but will remain available to this patient, the nursing and medical teams.  Please re-consult if  needed. Thanks, Maudie Flakes, MSN, RN, [place], Arther Abbott  Pager# [phone]

## 2016-02-20 LAB — GLUCOSE, CAPILLARY
GLUCOSE-CAPILLARY: 227 mg/dL — AB (ref 65–99)
Glucose-Capillary: 100 mg/dL — ABNORMAL HIGH (ref 65–99)
Glucose-Capillary: 134 mg/dL — ABNORMAL HIGH (ref 65–99)
Glucose-Capillary: 144 mg/dL — ABNORMAL HIGH (ref 65–99)

## 2016-02-20 LAB — BLOOD CULTURE ID PANEL (REFLEXED)
ACINETOBACTER BAUMANNII: NOT DETECTED
CANDIDA ALBICANS: NOT DETECTED
CANDIDA PARAPSILOSIS: NOT DETECTED
Candida glabrata: NOT DETECTED
Candida krusei: NOT DETECTED
Candida tropicalis: NOT DETECTED
ENTEROBACTERIACEAE SPECIES: NOT DETECTED
Enterobacter cloacae complex: NOT DETECTED
Enterococcus species: NOT DETECTED
Escherichia coli: NOT DETECTED
HAEMOPHILUS INFLUENZAE: NOT DETECTED
KLEBSIELLA OXYTOCA: NOT DETECTED
Klebsiella pneumoniae: NOT DETECTED
Listeria monocytogenes: NOT DETECTED
METHICILLIN RESISTANCE: NOT DETECTED
Neisseria meningitidis: NOT DETECTED
PSEUDOMONAS AERUGINOSA: NOT DETECTED
Proteus species: NOT DETECTED
STREPTOCOCCUS PNEUMONIAE: NOT DETECTED
STREPTOCOCCUS PYOGENES: NOT DETECTED
STREPTOCOCCUS SPECIES: NOT DETECTED
Serratia marcescens: NOT DETECTED
Staphylococcus aureus (BCID): NOT DETECTED
Staphylococcus species: DETECTED — AB
Streptococcus agalactiae: NOT DETECTED

## 2016-02-20 LAB — URINE CULTURE

## 2016-02-20 LAB — HEMOGLOBIN A1C
Hgb A1c MFr Bld: 6.2 % — ABNORMAL HIGH (ref 4.8–5.6)
MEAN PLASMA GLUCOSE: 131 mg/dL

## 2016-02-20 MED ORDER — ACETAMINOPHEN 160 MG/5ML PO SOLN
650.0000 mg | Freq: Four times a day (QID) | ORAL | Status: DC | PRN
  Administered 2016-02-22 – 2016-02-24 (×4): 650 mg via ORAL
  Filled 2016-02-20 (×5): qty 20.3

## 2016-02-20 MED ORDER — MAGNESIUM SULFATE 2 GM/50ML IV SOLN
2.0000 g | Freq: Once | INTRAVENOUS | Status: AC
  Administered 2016-02-20: 2 g via INTRAVENOUS
  Filled 2016-02-20: qty 50

## 2016-02-20 MED ORDER — GLUCERNA SHAKE PO LIQD
237.0000 mL | ORAL | Status: DC
  Administered 2016-02-20 – 2016-03-02 (×3): 237 mL via ORAL
  Filled 2016-02-20 (×13): qty 237

## 2016-02-20 MED ORDER — SODIUM CHLORIDE 0.9 % IV BOLUS (SEPSIS)
500.0000 mL | Freq: Once | INTRAVENOUS | Status: AC
  Administered 2016-02-20: 500 mL via INTRAVENOUS

## 2016-02-20 MED ORDER — HYDRALAZINE HCL 20 MG/ML IJ SOLN
10.0000 mg | Freq: Once | INTRAMUSCULAR | Status: AC
  Administered 2016-02-20: 10 mg via INTRAVENOUS
  Filled 2016-02-20: qty 1

## 2016-02-20 NOTE — Progress Notes (Signed)
Initial Nutrition Assessment  DOCUMENTATION CODES:   Severe malnutrition in context of acute illness/injury, Obesity unspecified  INTERVENTION:   Monitor magnesium, potassium, and phosphorus daily for at least 3 days, MD to replete as needed, as pt is at risk for refeeding syndrome given severe malnutrition, low Mg levels and suspected poor PO intake.  Provide Glucerna Shake po once daily, each supplement provides 220 kcal and 10 grams of protein RD to continue to monitor  NUTRITION DIAGNOSIS:   Increased nutrient needs related to wound healing as evidenced by estimated needs.  GOAL:   Patient will meet greater than or equal to 90% of their needs  MONITOR:   PO intake, Supplement acceptance, Labs, Weight trends, Skin, I & O's  REASON FOR ASSESSMENT:   Consult Assessment of nutrition requirement/status  ASSESSMENT:   82 y.o. female past medical history significant for metastatic renal cell carcinoma on comfort care with advanced dementia with recurrent UTIs comes in for lethargy.  RD attempted to visit patient, pt having blood drawn at this time. Pt currently full code per palliative care note. PTA pt comfort care d/t renal cell cancer. Pt currently refusing most medications. Consuming 0-10% of meals at this time. Suspect this is typical intake recently. Pt with increased needs d/t sacral wound. Will order Glucerna Shake once daily but unsure if pt will accept it. Will monitor.   Per weight history, pt has lost 22 lb since 4/17 (10% wt loss x 5 months, significant for time frame). Unable to perform NFPE. Will attempt at follow-up.  Medications: Mg sulfate IV once, D5 -.45% NaCl infusion at 50 ml/hr -provides 204 kcal Labs reviewed: CBGs: 100-134 Elevated Na Low Mg Phos WNL  Diet Order:  Diet Carb Modified Fluid consistency: Thin; Room service appropriate? Yes  Skin:  Wound (see comment) (sacral pressure injury)  Last BM:  PTA  Height:   Ht Readings from Last 1  Encounters:  02/18/16 5\' 5"  (1.651 m)    Weight:   Wt Readings from Last 1 Encounters:  02/18/16 188 lb (85.3 kg)    Ideal Body Weight:  56.8 kg  BMI:  Body mass index is 31.28 kg/m.  Estimated Nutritional Needs:   Kcal:  1550-1750  Protein:  75-85g  Fluid:  1.7L/day  EDUCATION NEEDS:   No education needs identified at this time  Clayton Bibles, MS, RD, LDN Pager: [phone] After Hours Pager: [phone]

## 2016-02-20 NOTE — Progress Notes (Addendum)
[facility] Liaison Note:  Received a request from [place] to discuss home with hospice verses patient going back to facility with the support of hospice.  Joined visit with PMT, Aniceto Boss, NP to further explore options upon discharge and answer question re: hospice care.  Daughter Davy Pique present and other daughter, [name] joined visit via speaker phone.  Initiated education related to hospice philosophy, services and team approach to care.  Daughters agreed that the support of hospice in the facility would be a good first step, then would would to further explore taking their mother home with hospice eventually.  The daughters would like their mother to remain a FULL CODE at this time.  [name] shared her grief related to the loss of her father and changing his code status to DNR, and is not ready to further discuss at this time.  Per discussion, plan is to discharge back to [facility] in the next couple days via [place].  Family is requesting that the patient keep the F/C at time of discharge.  Per family request, a fully-electric hospital bed has been ordered through [facility] and will be delivered to the facility prior to discharge.  [facility] aware of the above.  Completed discharge summary will need to be faxed to [facility] at [phone] when final.   Please notify [facility] when patient is ready to leave unit at discharge-call [phone].  [facility] information and contact numbers have been given to [facility] during visit.   Please call with any questions.  Thank You,  Freddi Starr RN, [facility] Liaison  [phone]

## 2016-02-20 NOTE — Progress Notes (Signed)
PHARMACY - PHYSICIAN COMMUNICATION CRITICAL VALUE ALERT - BLOOD CULTURE IDENTIFICATION (BCID)  Results for orders placed or performed during the hospital encounter of 02/18/16  Blood Culture ID Panel (Reflexed) (Collected: 02/18/2016  5:00 PM)  Result Value Ref Range   Enterococcus species NOT DETECTED NOT DETECTED   Listeria monocytogenes NOT DETECTED NOT DETECTED   Staphylococcus species DETECTED (A) NOT DETECTED   Staphylococcus aureus NOT DETECTED NOT DETECTED   Methicillin resistance NOT DETECTED NOT DETECTED   Streptococcus species NOT DETECTED NOT DETECTED   Streptococcus agalactiae NOT DETECTED NOT DETECTED   Streptococcus pneumoniae NOT DETECTED NOT DETECTED   Streptococcus pyogenes NOT DETECTED NOT DETECTED   Acinetobacter baumannii NOT DETECTED NOT DETECTED   Enterobacteriaceae species NOT DETECTED NOT DETECTED   Enterobacter cloacae complex NOT DETECTED NOT DETECTED   Escherichia coli NOT DETECTED NOT DETECTED   Klebsiella oxytoca NOT DETECTED NOT DETECTED   Klebsiella pneumoniae NOT DETECTED NOT DETECTED   Proteus species NOT DETECTED NOT DETECTED   Serratia marcescens NOT DETECTED NOT DETECTED   Haemophilus influenzae NOT DETECTED NOT DETECTED   Neisseria meningitidis NOT DETECTED NOT DETECTED   Pseudomonas aeruginosa NOT DETECTED NOT DETECTED   Candida albicans NOT DETECTED NOT DETECTED   Candida glabrata NOT DETECTED NOT DETECTED   Candida krusei NOT DETECTED NOT DETECTED   Candida parapsilosis NOT DETECTED NOT DETECTED   Candida tropicalis NOT DETECTED NOT DETECTED    Name of physician (or Provider) Contacted: Dr. Eliseo Squires  Changes to prescribed antibiotics required: No change ( likely contaminant)   Royetta Asal, PharmD, BCPS Pager [phone] 02/20/2016 2:02 PM

## 2016-02-20 NOTE — Progress Notes (Signed)
[facility] PROGRESS NOTE    Progress Note  Katie Dawson  KKX:[id number] DOB: [birth year] DOA: 02/18/2016 PCP: Cathlean Cower, MD     Brief Narrative:   Katie Dawson is an 82 y.o. female past medical history significant for metastatic renal cell carcinoma on comfort care with advanced dementia with recurrent UTIs comes in for lethargy.  Assessment/Plan:   Acute kidney injury: In the setting of a UTI and probably mild dehydration. Likely prerenal in etiology she was started on IV fluid hydration her creatinine has improved.   Encephalopathy superimposed on advanced dementia in the setting of a UTI with ecoli bacteremia:   UTI with e coli bacteremia: IV Rocephin-- dose increase Trend CBC and repeat blood cultures   Hypotension/essential hypertension: Currently resolved with holding antihypertensive medication. Continue IV fluid hydration.  History of metastatic renal cell carcinoma with lung metastases: She is currently under hospice care but is a full code as per daughter she would never want to her mother to be a DO NOT RESUSCITATE. Family does not grasp the progression and prognosis of her renal cell carcinoma, palliative Care met with family and there has been no change  Diabetes mellitus type 2: Agree with holding glipizide continue sliding scale insulin. Blood glucose is well controlled.         DVT prophylaxis: lovenox Family Communication: daughter at bedside Disposition Plan/Barrier to D/C: 2-3 days Code Status:     Code Status Orders        Start     Ordered   02/18/16 2322  Full code  Continuous     02/18/16 2321    Code Status History    Date Active Date Inactive Code Status Order ID Comments User Context   02/05/2016 12:28 AM 02/09/2016 10:21 PM Full Code [id number]  [name], MD Inpatient   09/13/2015  3:45 PM 09/17/2015  8:36 PM Full Code [id number]  [name], PA-C ED   04/05/2014  6:19 PM 04/07/2014  3:46 PM Full Code [id number]   [name], MD Inpatient   10/16/2013  4:35 PM 10/20/2013  7:17 PM Full Code [id number]  [name], MD Inpatient   05/16/2013 12:05 AM 05/18/2013  8:59 PM Full Code [id number]  [name], MD Inpatient        IV Access:    Peripheral IV   Procedures and diagnostic studies:   Ct Head Wo Contrast  Result Date: 02/18/2016 CLINICAL DATA:  Lethargy.  Metastatic disease. EXAM: CT HEAD WITHOUT CONTRAST TECHNIQUE: Contiguous axial images were obtained from the base of the skull through the vertex without intravenous contrast. COMPARISON:  CT head 02/04/2016 FINDINGS: Brain: Moderate atrophy. Low density throughout the cerebral white matter is unchanged and appears chronic. Negative for mass lesion. No shift of the midline structures. Negative for hemorrhage. No acute infarct. Vascular: No hyperdense vessel or unexpected calcification. Skull: Normal. Negative for fracture or focal lesion. Sinuses/Orbits: No acute finding. Other: None. IMPRESSION: Atrophy and chronic white matter changes are stable. No superimposed acute abnormality. Electronically Signed   By: Franchot Gallo M.D.   On: 02/18/2016 17:44   Dg Chest Portable 1 View  Result Date: 02/18/2016 CLINICAL DATA:  Altered mental status. History of previous renal cell carcinoma. EXAM: PORTABLE CHEST 1 VIEW COMPARISON:  Chest CT April 06, 2016 FINDINGS: The previously noted mass in the left upper lobe near the apex is visualize, currently measuring 4.0 x 3.4 cm in size. There is no edema or consolidation.  There is bilateral hilar adenopathy, more pronounced on the left than on the right, stable. There are S scattered nodular opacities in both lower lobes, better appreciated on recent CT than on the current radiographic examination. No new opacity is evident. There is atherosclerotic calcification in the aortic arch region. There are postoperative clips in the midline cervical-thoracic region. No bone lesions are evident. IMPRESSION:  Evidence of metastatic disease with lung masses and adenopathy, better characterized on recent chest CT. No new opacity evident. No airspace consolidations suggestive of acute pneumonia. There is aortic atherosclerosis. Electronically Signed   By: Lowella Grip III M.D.   On: 02/18/2016 14:15     Medical Consultants:    Palliative care  Anti-Infectives:   Rocephin  Subjective:    Says she hurts all over  Objective:    Vitals:   02/19/16 1951 02/19/16 2329 02/20/16 0109 02/20/16 0400  BP: (!) 159/62 (!) 183/77  (!) 158/62  Pulse: 85 97  81  Resp: '16 18  18  ' Temp: (!) 100.7 F (38.2 C) (!) 101.4 F (38.6 C) (!) 102.4 F (39.1 C) 99.2 F (37.3 C)  TempSrc: Oral Oral Oral Oral  SpO2: 97% 100%  96%  Weight:      Height:        Intake/Output Summary (Last 24 hours) at 02/20/16 1202 Last data filed at 02/20/16 1021  Gross per 24 hour  Intake          1528.75 ml  Output             1900 ml  Net          -371.25 ml   Filed Weights   02/18/16 1250  Weight: 85.3 kg (188 lb)    Exam: General exam: In no acute distress, alert Respiratory system: Good air movement and clear to auscultation. Cardiovascular system: S1 & S2 heard, RRR. No JVD, murmurs, rubs, gallops or clicks.  Gastrointestinal system: Abdomen is nondistended, soft and nontender.  Extremities: No pedal edema. Skin: No rashes, lesions or ulcers    Data Reviewed:    Labs: Basic Metabolic Panel:  Recent Labs Lab 02/18/16 1416 02/19/16 0448  NA 141 146*  K 4.1 3.6  CL 109 117*  CO2 23 22  GLUCOSE 184* 118*  BUN 52* 44*  CREATININE 2.36* 1.67*  CALCIUM 9.3 8.6*  MG  --  1.5*  PHOS  --  3.4   GFR Estimated Creatinine Clearance: 29.5 mL/min (by C-G formula based on SCr of 1.67 mg/dL (H)). Liver Function Tests:  Recent Labs Lab 02/18/16 1416 02/19/16 0448  AST 14* 13*  ALT 22 18  ALKPHOS 87 86  BILITOT 0.6 0.3  PROT 6.7 5.5*  ALBUMIN 3.0* 2.3*   No results for input(s):  LIPASE, AMYLASE in the last 168 hours. No results for input(s): AMMONIA in the last 168 hours. Coagulation profile No results for input(s): INR, PROTIME in the last 168 hours.  CBC:  Recent Labs Lab 02/18/16 1416 02/19/16 0448  WBC 25.9* 33.2*  NEUTROABS 23.9*  --   HGB 9.9* 8.5*  HCT 31.3* 26.2*  MCV 89.4 86.8  PLT 365 305   Cardiac Enzymes:  Recent Labs Lab 02/18/16 1416  TROPONINI <0.03   BNP (last 3 results) No results for input(s): PROBNP in the last 8760 hours. CBG:  Recent Labs Lab 02/19/16 1145 02/19/16 1632 02/19/16 2039 02/20/16 0745 02/20/16 1145  GLUCAP 129* 106* 104* 100* 134*   D-Dimer: No results for input(s): DDIMER  in the last 72 hours. Hgb A1c:  Recent Labs  02/19/16 0448  HGBA1C 6.2*   Lipid Profile: No results for input(s): CHOL, HDL, LDLCALC, TRIG, CHOLHDL, LDLDIRECT in the last 72 hours. Thyroid function studies:  Recent Labs  02/19/16 0448  TSH 0.878   Anemia work up: No results for input(s): VITAMINB12, FOLATE, FERRITIN, TIBC, IRON, RETICCTPCT in the last 72 hours. Sepsis Labs:  Recent Labs Lab 02/18/16 1416 02/18/16 1426 02/18/16 1630 02/19/16 0448  WBC 25.9*  --   --  33.2*  LATICACIDVEN  --  0.90 1.30  --    Microbiology Recent Results (from the past 240 hour(s))  Urine culture     Status: Abnormal   Collection Time: 02/18/16  3:35 PM  Result Value Ref Range Status   Specimen Description URINE, CLEAN CATCH  Final   Special Requests NONE  Final   Culture >=100,000 COLONIES/mL ESCHERICHIA COLI (A)  Final   Report Status 02/20/2016 FINAL  Final   Organism ID, Bacteria ESCHERICHIA COLI (A)  Final      Susceptibility   Escherichia coli - MIC*    AMPICILLIN <=2 SENSITIVE Sensitive     CEFAZOLIN <=4 SENSITIVE Sensitive     CEFTRIAXONE <=1 SENSITIVE Sensitive     CIPROFLOXACIN <=0.25 SENSITIVE Sensitive     GENTAMICIN <=1 SENSITIVE Sensitive     IMIPENEM <=0.25 SENSITIVE Sensitive     NITROFURANTOIN <=16  SENSITIVE Sensitive     TRIMETH/SULFA <=20 SENSITIVE Sensitive     AMPICILLIN/SULBACTAM <=2 SENSITIVE Sensitive     PIP/TAZO <=4 SENSITIVE Sensitive     Extended ESBL NEGATIVE Sensitive     * >=100,000 COLONIES/mL ESCHERICHIA COLI  Blood culture (routine x 2)     Status: Abnormal (Preliminary result)   Collection Time: 02/18/16  4:20 PM  Result Value Ref Range Status   Specimen Description BLOOD RIGHT ANTECUBITAL  Final   Special Requests BOTTLES DRAWN AEROBIC ONLY 5CC  Final   Culture  Setup Time   Final    GRAM NEGATIVE RODS AEROBIC BOTTLE ONLY CRITICAL RESULT CALLED TO, READ BACK BY AND VERIFIED WITH: C. SUMME, PHARMD ([facility]) AT 1930 ON 02/19/16 BY C. JESSUP, MLT. Performed at [place] (A)  Final   Report Status PENDING  Incomplete  Blood Culture ID Panel (Reflexed)     Status: Abnormal   Collection Time: 02/18/16  4:20 PM  Result Value Ref Range Status   Enterococcus species NOT DETECTED NOT DETECTED Final   Listeria monocytogenes NOT DETECTED NOT DETECTED Final   Staphylococcus species NOT DETECTED NOT DETECTED Final   Staphylococcus aureus NOT DETECTED NOT DETECTED Final   Streptococcus species NOT DETECTED NOT DETECTED Final   Streptococcus agalactiae NOT DETECTED NOT DETECTED Final   Streptococcus pneumoniae NOT DETECTED NOT DETECTED Final   Streptococcus pyogenes NOT DETECTED NOT DETECTED Final   Acinetobacter baumannii NOT DETECTED NOT DETECTED Final   Enterobacteriaceae species DETECTED (A) NOT DETECTED Final    Comment: CRITICAL RESULT CALLED TO, READ BACK BY AND VERIFIED WITH: C. SUMME, PHARMD ([facility]) AT 1930 ON 02/19/16 BY C. JESSUP, MLT.    Enterobacter cloacae complex NOT DETECTED NOT DETECTED Final   Escherichia coli DETECTED (A) NOT DETECTED Final    Comment: CRITICAL RESULT CALLED TO, READ BACK BY AND VERIFIED WITH: C. SUMME, PHARMD ([facility]) AT 1930 ON 02/19/16 BY C. JESSUP, MLT.    Klebsiella oxytoca NOT DETECTED NOT DETECTED Final    Klebsiella pneumoniae NOT  DETECTED NOT DETECTED Final   Proteus species NOT DETECTED NOT DETECTED Final   Serratia marcescens NOT DETECTED NOT DETECTED Final   Carbapenem resistance NOT DETECTED NOT DETECTED Final   Haemophilus influenzae NOT DETECTED NOT DETECTED Final   Neisseria meningitidis NOT DETECTED NOT DETECTED Final   Pseudomonas aeruginosa NOT DETECTED NOT DETECTED Final   Candida albicans NOT DETECTED NOT DETECTED Final   Candida glabrata NOT DETECTED NOT DETECTED Final   Candida krusei NOT DETECTED NOT DETECTED Final   Candida parapsilosis NOT DETECTED NOT DETECTED Final   Candida tropicalis NOT DETECTED NOT DETECTED Final    Comment: Performed at [facility]  Blood culture (routine x 2)     Status: None (Preliminary result)   Collection Time: 02/18/16  5:00 PM  Result Value Ref Range Status   Specimen Description BLOOD LEFT ANTECUBITAL  Final   Special Requests IN PEDIATRIC BOTTLE 2 CC  Final   Culture  Setup Time   Final    GRAM POSITIVE COCCI IN CLUSTERS IN PEDIATRIC BOTTLE Organism ID to follow CRITICAL RESULT CALLED TO, READ BACK BY AND VERIFIED WITH: T. Pickering Pharm.D. 9:55 02/20/16 ([email or web address])    Culture   Final    NO GROWTH 2 DAYS Performed at [facility]    Report Status PENDING  Incomplete  Blood Culture ID Panel (Reflexed)     Status: Abnormal   Collection Time: 02/18/16  5:00 PM  Result Value Ref Range Status   Enterococcus species NOT DETECTED NOT DETECTED Final   Listeria monocytogenes NOT DETECTED NOT DETECTED Final   Staphylococcus species DETECTED (A) NOT DETECTED Final    Comment: CRITICAL RESULT CALLED TO, READ BACK BY AND VERIFIED WITH: T. Pickering Pharm.D. 9:55 02/20/16 ([email or web address])    Staphylococcus aureus NOT DETECTED NOT DETECTED Final   Methicillin resistance NOT DETECTED NOT DETECTED Final   Streptococcus species NOT DETECTED NOT DETECTED Final   Streptococcus agalactiae NOT DETECTED NOT DETECTED Final   Streptococcus  pneumoniae NOT DETECTED NOT DETECTED Final   Streptococcus pyogenes NOT DETECTED NOT DETECTED Final   Acinetobacter baumannii NOT DETECTED NOT DETECTED Final   Enterobacteriaceae species NOT DETECTED NOT DETECTED Final   Enterobacter cloacae complex NOT DETECTED NOT DETECTED Final   Escherichia coli NOT DETECTED NOT DETECTED Final   Klebsiella oxytoca NOT DETECTED NOT DETECTED Final   Klebsiella pneumoniae NOT DETECTED NOT DETECTED Final   Proteus species NOT DETECTED NOT DETECTED Final   Serratia marcescens NOT DETECTED NOT DETECTED Final   Haemophilus influenzae NOT DETECTED NOT DETECTED Final   Neisseria meningitidis NOT DETECTED NOT DETECTED Final   Pseudomonas aeruginosa NOT DETECTED NOT DETECTED Final   Candida albicans NOT DETECTED NOT DETECTED Final   Candida glabrata NOT DETECTED NOT DETECTED Final   Candida krusei NOT DETECTED NOT DETECTED Final   Candida parapsilosis NOT DETECTED NOT DETECTED Final   Candida tropicalis NOT DETECTED NOT DETECTED Final    Comment: Performed at [facility]     Medications:   . aspirin EC  81 mg Oral Daily  . atorvastatin  10 mg Oral Daily  . cefTRIAXone (ROCEPHIN)  IV  2 g Intravenous Q24H  . citalopram  10 mg Oral Daily  . cloNIDine  0.1 mg Oral BID  . docusate  0.1 mg Oral Q30 days  . donepezil  10 mg Oral Daily  . enoxaparin (LOVENOX) injection  30 mg Subcutaneous QHS  . gabapentin  300 mg Oral TID  . insulin aspart  0-5 Units Subcutaneous QHS  . insulin aspart  0-9 Units Subcutaneous TID WC  . insulin glargine  10 Units Subcutaneous QHS  . isosorbide mononitrate  60 mg Oral Daily  . labetalol  100 mg Oral BID  . nystatin  1 g Topical QID  . risperiDONE  0.5 mg Oral QHS   Continuous Infusions: . dextrose 5 % and 0.45% NaCl 75 mL/hr at 02/19/16 1707    Time spent: 25 min   LOS: 1 day   [facility] Pager [phone]  *Please refer to [email or web address], password [email or web address] to get updated schedule on who will  round on this patient, as hospitalists switch teams weekly. If 7PM-7AM, please contact night-coverage at [email or web address], password [email or web address] for any overnight needs.  02/20/2016, 12:02 PM

## 2016-02-21 DIAGNOSIS — E43 Unspecified severe protein-calorie malnutrition: Secondary | ICD-10-CM | POA: Insufficient documentation

## 2016-02-21 DIAGNOSIS — R7881 Bacteremia: Secondary | ICD-10-CM

## 2016-02-21 DIAGNOSIS — E876 Hypokalemia: Secondary | ICD-10-CM

## 2016-02-21 LAB — CULTURE, BLOOD (ROUTINE X 2)

## 2016-02-21 LAB — CBC
HEMATOCRIT: 31.7 % — AB (ref 36.0–46.0)
Hemoglobin: 9.9 g/dL — ABNORMAL LOW (ref 12.0–15.0)
MCH: 27.4 pg (ref 26.0–34.0)
MCHC: 31.2 g/dL (ref 30.0–36.0)
MCV: 87.8 fL (ref 78.0–100.0)
PLATELETS: 295 10*3/uL (ref 150–400)
RBC: 3.61 MIL/uL — ABNORMAL LOW (ref 3.87–5.11)
RDW: 14.9 % (ref 11.5–15.5)
WBC: 17.1 10*3/uL — AB (ref 4.0–10.5)

## 2016-02-21 LAB — GLUCOSE, CAPILLARY
GLUCOSE-CAPILLARY: 178 mg/dL — AB (ref 65–99)
Glucose-Capillary: 150 mg/dL — ABNORMAL HIGH (ref 65–99)
Glucose-Capillary: 116 mg/dL — ABNORMAL HIGH (ref 65–99)
Glucose-Capillary: 143 mg/dL — ABNORMAL HIGH (ref 65–99)

## 2016-02-21 LAB — BASIC METABOLIC PANEL
Anion gap: 6 (ref 5–15)
BUN: 15 mg/dL (ref 6–20)
CALCIUM: 8.7 mg/dL — AB (ref 8.9–10.3)
CO2: 25 mmol/L (ref 22–32)
CREATININE: 0.87 mg/dL (ref 0.44–1.00)
Chloride: 114 mmol/L — ABNORMAL HIGH (ref 101–111)
GFR calc Af Amer: >60 mL/min (ref >60)
GLUCOSE: 142 mg/dL — AB (ref 65–99)
Potassium: 2.9 mmol/L — ABNORMAL LOW (ref 3.5–5.1)
Sodium: 145 mmol/L (ref 135–145)

## 2016-02-21 MED ORDER — CLONIDINE HCL 0.2 MG/24HR TD PTWK
0.2000 mg | MEDICATED_PATCH | TRANSDERMAL | Status: DC
  Administered 2016-02-21: 0.2 mg via TRANSDERMAL
  Filled 2016-02-21: qty 1

## 2016-02-21 MED ORDER — MAGNESIUM SULFATE 2 GM/50ML IV SOLN
2.0000 g | Freq: Once | INTRAVENOUS | Status: AC
Start: 2016-02-21 — End: 2016-02-21
  Administered 2016-02-21: 2 g via INTRAVENOUS
  Filled 2016-02-21: qty 50

## 2016-02-21 MED ORDER — LORAZEPAM 2 MG/ML IJ SOLN
1.0000 mg | Freq: Once | INTRAMUSCULAR | Status: AC
  Administered 2016-02-21: 1 mg via INTRAVENOUS
  Filled 2016-02-21: qty 1

## 2016-02-21 MED ORDER — POTASSIUM CHLORIDE 10 MEQ/100ML IV SOLN
10.0000 meq | INTRAVENOUS | Status: AC
  Administered 2016-02-21 (×5): 10 meq via INTRAVENOUS
  Filled 2016-02-21: qty 100

## 2016-02-21 MED ORDER — ENOXAPARIN SODIUM 40 MG/0.4ML ~~LOC~~ SOLN
40.0000 mg | Freq: Every day | SUBCUTANEOUS | Status: DC
  Administered 2016-02-21 – 2016-03-02 (×11): 40 mg via SUBCUTANEOUS
  Filled 2016-02-21 (×11): qty 0.4

## 2016-02-21 MED ORDER — LIP MEDEX EX OINT
TOPICAL_OINTMENT | CUTANEOUS | Status: DC | PRN
  Filled 2016-02-21: qty 7

## 2016-02-21 NOTE — Progress Notes (Addendum)
Daily Progress Note   Patient Name: TENNISHA Dawson       Date: 02/21/2016 DOB: [birth year]  Age: 82 y.o. MRN#: [id number] Attending Physician: Geradine Girt, DO Primary Care Physician: Cathlean Cower, MD Admit Date: 02/18/2016  Reason for Consultation/Follow-up: Disposition, Establishing goals of care, Hospice Evaluation and Psychosocial/spiritual support  Subjective: Katie Dawson is resting quietly in bed. Her eyes are open, she makes and keeps eye contact as I enter. She responds to my greeting. There is no family of bedside at this time.  Katie Dawson denies pain at this time. She is unwilling to eat her pancakes, and takes just one bite of yogurt.  She drinks her entire class of tea easily.   Call to daughter [name]. We talk about the treatment plan including changing medications to liquids, RD suggestions. I share the normal trajectory of decreased PO intake with dementia. We also talk about the pland to return Katie Dawson, when she is ready, to the [facility] with the services of hospice and palliative of [place].  I encourage Katie Dawson to lean on hospice as they will be her best advocate.  [name] shares that her mother's diagnosis of cancer came as a shock to them. They were not aware that her kidney was removed because of cancer.  Conference with nursing staff, Education officer, museum, and Dr. Eliseo Squires.    Length of Stay: 2  Current Medications: Scheduled Meds:  . aspirin EC  81 mg Oral Daily  . atorvastatin  10 mg Oral Daily  . cefTRIAXone (ROCEPHIN)  IV  2 g Intravenous Q24H  . citalopram  10 mg Oral Daily  . cloNIDine  0.1 mg Oral BID  . docusate  0.1 mg Oral Q30 days  . donepezil  10 mg Oral Daily  . enoxaparin (LOVENOX) injection  40 mg Subcutaneous QHS  . feeding supplement  (GLUCERNA SHAKE)  237 mL Oral Q24H  . gabapentin  300 mg Oral TID  . insulin aspart  0-5 Units Subcutaneous QHS  . insulin aspart  0-9 Units Subcutaneous TID WC  . insulin glargine  10 Units Subcutaneous QHS  . isosorbide mononitrate  60 mg Oral Daily  . labetalol  100 mg Oral BID  . nystatin  1 g Topical QID  . potassium chloride  10 mEq Intravenous Q1 Hr x  5  . risperiDONE  0.5 mg Oral QHS    Continuous Infusions: . dextrose 5 % and 0.45% NaCl 50 mL/hr at 02/20/16 1241    PRN Meds: acetaminophen (TYLENOL) oral liquid 160 mg/5 mL, HYDROcodone-acetaminophen, lip balm, ondansetron **OR** ondansetron (ZOFRAN) IV  Physical Exam  Constitutional: No distress.  Chronically ill appearing, frail  HENT:  Head: Normocephalic and atraumatic.  Cardiovascular: Normal rate and regular rhythm.   Pulmonary/Chest: Effort normal. No respiratory distress.  Abdominal: Soft. She exhibits no distension. There is no guarding.  Neurological: She is alert.  Demented  Skin: Skin is warm and dry.  Nursing note and vitals reviewed.           Vital Signs: BP (!) 148/54   Pulse 88   Temp 99.3 F (37.4 C) (Axillary)   Resp 15   Ht 5\' 5"  (1.651 m)   Wt 85.3 kg (188 lb)   SpO2 97%   BMI 31.28 kg/m  SpO2: SpO2: 97 % O2 Device: O2 Device: Not Delivered O2 Flow Rate:    Intake/output summary:  Intake/Output Summary (Last 24 hours) at 02/21/16 1117 Last data filed at 02/21/16 0850  Gross per 24 hour  Intake          3965.41 ml  Output             3650 ml  Net           315.41 ml   LBM: Last BM Date:  (uncertain) Baseline Weight: Weight: 85.3 kg (188 lb) Most recent weight: Weight: 85.3 kg (188 lb)       Palliative Assessment/Data:    Flowsheet Rows   Flowsheet Row Most Recent Value  Intake Tab  Referral Department  Hospitalist  Unit at Time of Referral  Med/Surg Unit  Palliative Care Primary Diagnosis  Cancer  Date Notified  02/18/16  Date of Admission  02/18/16  Date first seen by  Palliative Care  02/19/16  # of days Palliative referral response time  1 Day(s)  # of days IP prior to Palliative referral  0  Clinical Assessment  Palliative Performance Scale Score  30%  Pain Max last 24 hours  Not able to report  Pain Min Last 24 hours  Not able to report  Dyspnea Max Last 24 Hours  Not able to report  Dyspnea Min Last 24 hours  Not able to report  Psychosocial & Spiritual Assessment  Palliative Care Outcomes  Patient/Family meeting held?  Yes  Who was at the meeting?  daughter Becky Sax  Palliative Care Outcomes  Provided end of life care assistance, Provided advance care planning, Provided psychosocial or spiritual support, Counseled regarding hospice  Palliative Care follow-up planned  -- [follow up while at [facility]]      Patient Active Problem List   Diagnosis Date Noted  . Palliative care encounter   . DNR (do not resuscitate) discussion   . Goals of care, counseling/discussion   . DM type 2 causing CKD stage 3 ([place]) 02/18/2016  . Hypotension 02/18/2016  . Metastatic cancer to lung ([place]) 02/08/2016  . Acute encephalopathy   . AKI (acute kidney injury) ([place])   . External hemorrhoid, thrombosed 01/04/2016  . Hematochezia 01/04/2016  . Syncope 10/07/2015  . Lethargy 09/13/2015  . Dizziness 04/05/2014  . Abnormal urine odor 02/07/2014  . Complicated UTI (urinary tract infection) 10/17/2013  . Hypoglycemia 10/16/2013  . Heart murmur 09/16/2013  . Agitation 09/16/2013  . Back pain 05/18/2013  . Altered mental state  05/15/2013  . Lower extremity weakness 05/15/2013  . ARF (acute renal failure) ([place]) 05/15/2013  . Leucocytosis 05/15/2013  . Peripheral neuropathy ([place]) 05/15/2013  . Aortic stenosis 02/10/2013  . Dementia 02/10/2013  . Vitamin D deficiency 04/10/2012  . Depression 04/10/2012  . Confusion 04/08/2012  . Closed fibular fracture 12/24/2011  . Wrist fracture, closed 12/24/2011  . Fatigue 11/05/2010  . Preventative health care 11/03/2010  .  INTERMITTENT VERTIGO 04/20/2009  . OSTEOPENIA 02/12/2009  . FOOT PAIN, LEFT 12/23/2007  . Insulin dependent diabetes mellitus ([place]) 04/22/2007  . HLD (hyperlipidemia) 04/22/2007  . GOUT 04/22/2007  . ANEMIA-IRON DEFICIENCY 04/22/2007  . ANXIETY 04/22/2007  . Sleep apnea 04/22/2007  . Essential hypertension 04/22/2007  . GERD 04/22/2007  . Disorder resulting from impaired renal function 04/22/2007  . 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 COLONIC POLYPS, HX OF 04/22/2007  . NEPHRECTOMY, HX OF 04/22/2007    Palliative Care Assessment & Plan   Patient Profile: 82 y.o. female  with past medical history of dementia, Jerrye Bushy, doubt, hypertension, hyperlipidemia, history of neck for ectomy due to cancer in 2008, obstructive sleep apnea, history of UTIs, diabetes type II, anxiety, anemia admitted on 02/18/2016 with UTI, and metastatic renal cell carcinoma extensive pulmonary metastasis, enlarging L mass.   Assessment: As above  Recommendations/Plan:  continue to treat the treatable, but no extraordinary measures such as CPR intubation. Goal is to return to [facility] with the benefits of hospice and palliative services of [place].  Goals of Care and Additional Recommendations:  Limitations on Scope of Treatment: continue to treat the treatable, but no extraordinary measures such as CPR intubation.  Code Status:    Code Status Orders        Start     Ordered   02/18/16 2322  Full code  Continuous     02/18/16 2321    Code Status History    Date Active Date Inactive Code Status Order ID Comments User Context   02/05/2016 12:28 AM 02/09/2016 10:21 PM Full Code [id number]  [name], MD Inpatient   09/13/2015  3:45 PM 09/17/2015  8:36 PM Full Code [id number]  [name], PA-C ED   04/05/2014  6:19 PM 04/07/2014  3:46 PM Full Code [id number]  [name], MD Inpatient   10/16/2013  4:35 PM 10/20/2013  7:17 PM Full Code [id number]  [name], MD  Inpatient   05/16/2013 12:05 AM 05/18/2013  8:59 PM Full Code [id number]  [name], MD Inpatient       Prognosis:   < 6 months, likely based on cancer burden with metastatic disease, and states dementia, frailty, functional decline.  Discharge Planning:  return to [facility] with the benefits of hospice and palliative services of [place].  Care plan was discussed with nursing staff, social worker, and Dr. Eliseo Squires.   Thank you for allowing the Palliative Medicine Team to assist in the care of this patient.   Time In: 0910 Time Out: 0955 Total Time 45 minutes  Prolonged Time Billed  no       Greater than 50%  of this time was spent counseling and coordinating care related to the above assessment and plan.  Drue Novel, NP  Please contact Palliative Medicine Team phone at [phone] for questions and concerns.

## 2016-02-21 NOTE — Evaluation (Signed)
Clinical/Bedside Swallow Evaluation Patient Details  Name: ALANIZ SKIDMORE MRN: [id number] Date of Birth: [birth year]  Today's Date: 02/21/2016 Time: SLP Start Time (ACUTE ONLY): 1147 SLP Stop Time (ACUTE ONLY): 1205 SLP Time Calculation (min) (ACUTE ONLY): 18 min  Past Medical History:  Past Medical History:  Diagnosis Date  . ANEMIA-IRON DEFICIENCY 04/22/2007  . ANXIETY 04/22/2007  . COLONIC POLYPS, HX OF 04/22/2007  . Dementia   . DIABETES MELLITUS, TYPE II 04/22/2007  . FOOT PAIN, LEFT 12/23/2007  . GERD 04/22/2007  . GOUT 04/22/2007  . HYPERLIPIDEMIA 04/22/2007  . HYPERSOMNIA 01/11/2009  . HYPERTENSION 04/22/2007  . INTERMITTENT VERTIGO 04/20/2009  . NEPHRECTOMY, HX OF 04/22/2007  . OBSTRUCTIVE SLEEP APNEA 04/22/2007  . 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 OSTEOPENIA 02/12/2009  . RENAL INSUFFICIENCY 04/22/2007  . Right fibular fracture July 2013  . Urinary tract infection    hx of   Past Surgical History:  Past Surgical History:  Procedure Laterality Date  . ORIF WRIST FRACTURE  01/02/2012   Procedure: OPEN REDUCTION INTERNAL FIXATION (ORIF) WRIST FRACTURE;  Surgeon: Schuyler Amor, MD;  Location: [facility];  Service: Orthopedics;  Laterality: Right;  Open Reduction Internal Fixation Right Distal Radius   . s/p parathyroid surgury    . s/p right nephrectomy     HPI:  pt is a 82 yo female adm to [facility] with CARLOTA MERWIN is an 82 y.o. female past medical history significant for metastatic renal cell carcinoma followed by hospice but full code;pt has advanced dementia with recurrent UTIs and admit for lethargy.  CXR showed mets, CT head negative for acute change.  Swallow eval ordered upon admit.    Assessment / Plan / Recommendation Clinical Impression  Pt has generalized weakness but no focal cranial nerve deficits.  She has cognitive deficits and therefore did not conduct dry swallow and has expressive language deficits *word finding deficits.  Pt provided with  soda via straw, timely swallow and clear voice throughout.  Pt adamantly declined to take ice cream or solids with this SLP.  She did not articulate reasoning for not eating solids.  Recommend to maximize liquid nutrition and order softer foods given pt's poor dentition.  SlP to sign off.     Aspiration Risk  Mild aspiration risk    Diet Recommendation Dysphagia 3 (Mech soft);Thin liquid   Liquid Administration via: Straw;Cup Medication Administration:  (consider liquid medications) Supervision: Full supervision/cueing for compensatory strategies Compensations: Slow rate;Small sips/bites Postural Changes: Seated upright at 90 degrees;Remain upright for at least 30 minutes after po intake    Other  Recommendations     Follow up Recommendations None      Frequency and Duration            Prognosis        Swallow Study   General Date of Onset: 02/21/16 HPI: pt is a 82 yo female adm to [facility] with TANYETTA QUIMBY is an 82 y.o. female past medical history significant for metastatic renal cell carcinoma followed by hospice but full code;pt has advanced dementia with recurrent UTIs and admit for lethargy.  CXR showed mets, CT head negative for acute change.  Swallow eval ordered upon admit.  Type of Study: Bedside Swallow Evaluation Diet Prior to this Study: Regular;Dysphagia 1 (puree) Temperature Spikes Noted: Yes Respiratory Status: Room air History of Recent Intubation: No Behavior/Cognition: Alert;Requires cueing;Other (Comment);Doesn't follow directions Oral Cavity Assessment: Dry Oral Care Completed by SLP: No Oral Cavity - Dentition: Edentulous;Poor  condition Self-Feeding Abilities: Total assist Patient Positioning: Upright in bed Baseline Vocal Quality: Low vocal intensity (pt with expressive language deficits) Volitional Cough: Cognitively unable to elicit Volitional Swallow: Unable to elicit    Oral/Motor/Sensory Function Overall Oral Motor/Sensory Function: Generalized  oral weakness   Ice Chips Ice chips: Not tested   Thin Liquid Thin Liquid: Within functional limits Presentation: Straw Other Comments: 2/3 of a soda    Nectar Thick Nectar Thick Liquid: Not tested   Honey Thick Honey Thick Liquid: Not tested   Puree Puree: Not tested Other Comments: pt declined to consume icecream despite saying she would initially   Solid   GO   Other Comments: pt declined to consume        Claudie Fisherman, [facility] SLP [phone]

## 2016-02-21 NOTE — Progress Notes (Signed)
[facility] PROGRESS NOTE    Progress Note  Katie Dawson  [id number] DOB: [birth year] DOA: 02/18/2016 PCP: Cathlean Cower, MD     Brief Narrative:   Katie Dawson is an 82 y.o. female past medical history significant for metastatic renal cell carcinoma with advanced dementia and recurrent UTIs comes in for lethargy.  Assessment/Plan:   Acute kidney injury: In the setting of a UTI and probably mild dehydration. Likely prerenal in etiology she was started on IV fluid hydration her creatinine has improved.  Encephalopathy superimposed on advanced dementia in the setting of a UTI with ecoli bacteremia:   UTI with e coli bacteremia: IV Rocephin-- dose increase Trend CBC and repeat blood cultures   Hypotension/essential hypertension: Currently resolved with holding antihypertensive medication. Continue IV fluid hydration.  History of metastatic renal cell carcinoma with lung metastases: She is currently under hospice care but is a full code as per daughter she would never want to her mother to be a DO NOT RESUSCITATE. Family does not grasp the progression and prognosis of her renal cell carcinoma, palliative Care met with family and there has been no change  Diabetes mellitus type 2: Agree with holding glipizide continue sliding scale insulin. Blood glucose is well controlled.  Hypokalemia/hypomagnesium -replete  Patient only taking in liquid items and refusing medications-- changed meds to liquid form or patched        DVT prophylaxis: lovenox Family Communication: daughter at bedside 9/20 Disposition Plan/Barrier to D/C: ALF in 1-2 days Code Status:     Code Status Orders        Start     Ordered   02/18/16 2322  Full code  Continuous     02/18/16 2321    Code Status History    Date Active Date Inactive Code Status Order ID Comments User Context   02/05/2016 12:28 AM 02/09/2016 10:21 PM Full Code [id number]  [name], MD Inpatient   09/13/2015   3:45 PM 09/17/2015  8:36 PM Full Code [id number]  [name], PA-C ED   04/05/2014  6:19 PM 04/07/2014  3:46 PM Full Code [id number]  [name], MD Inpatient   10/16/2013  4:35 PM 10/20/2013  7:17 PM Full Code [id number]  [name], MD Inpatient   05/16/2013 12:05 AM 05/18/2013  8:59 PM Full Code [id number]  [name], MD Inpatient        IV Access:    Peripheral IV   Procedures and diagnostic studies:   No results found.   Medical Consultants:    Palliative care  Anti-Infectives:   Rocephin  Subjective:    Feeling better  Objective:    Vitals:   02/20/16 1945 02/20/16 2256 02/21/16 0430 02/21/16 0620  BP: (!) 185/80 (!) 183/67 (!) 185/66 (!) 148/54  Pulse: 84 80 88   Resp: _0 Temp: (!) 100.5 F (38.1 C) 99.9 F (37.7 C) 99.3 F (37.4 C)   TempSrc: Axillary  Axillary   SpO2: 95% 95% 97%   Weight:      Height:        Intake/Output Summary (Last 24 hours) at 02/21/16 1242 Last data filed at 02/21/16 0850  Gross per 24 hour  Intake          3725.41 ml  Output             2800 ml  Net           925.41 ml  Filed Weights   02/18/16 1250  Weight: 85.3 kg (188 lb)    Exam: General exam: In no acute distress, alert Respiratory system: Good air movement and clear to auscultation. Cardiovascular system: S1 & S2 heard, RRR. No JVD, murmurs, rubs, gallops or clicks.  Gastrointestinal system: Abdomen is nondistended, soft and nontender.  Extremities: No pedal edema. Skin: No rashes, lesions or ulcers    Data Reviewed:    Labs: Basic Metabolic Panel:  Recent Labs Lab 02/18/16 1416 02/19/16 0448 02/21/16 0620  NA 141 146* 145  K 4.1 3.6 2.9*  CL 109 117* 114*  CO2 _0 GLUCOSE 184* 118* 142*  BUN 52* 44* 15  CREATININE 2.36* 1.67* 0.87  CALCIUM 9.3 8.6* 8.7*  MG  --  1.5*  --   PHOS  --  3.4  --    GFR Estimated Creatinine Clearance: 56.5 mL/min (by C-G formula based on SCr of 0.87 mg/dL). Liver Function  Tests:  Recent Labs Lab 02/18/16 1416 02/19/16 0448  AST 14* 13*  ALT 22 18  ALKPHOS 87 86  BILITOT 0.6 0.3  PROT 6.7 5.5*  ALBUMIN 3.0* 2.3*   No results for input(s): LIPASE, AMYLASE in the last 168 hours. No results for input(s): AMMONIA in the last 168 hours. Coagulation profile No results for input(s): INR, PROTIME in the last 168 hours.  CBC:  Recent Labs Lab 02/18/16 1416 02/19/16 0448 02/21/16 0620  WBC 25.9* 33.2* 17.1*  NEUTROABS 23.9*  --   --   HGB 9.9* 8.5* 9.9*  HCT 31.3* 26.2* 31.7*  MCV 89.4 86.8 87.8  PLT 365 305 295   Cardiac Enzymes:  Recent Labs Lab 02/18/16 1416  TROPONINI <0.03   BNP (last 3 results) No results for input(s): PROBNP in the last 8760 hours. CBG:  Recent Labs Lab 02/20/16 1145 02/20/16 1626 02/20/16 2128 02/21/16 0748 02/21/16 1146  GLUCAP 134* 144* 227* 150* 116*   D-Dimer: No results for input(s): DDIMER in the last 72 hours. Hgb A1c:  Recent Labs  02/19/16 0448  HGBA1C 6.2*   Lipid Profile: No results for input(s): CHOL, HDL, LDLCALC, TRIG, CHOLHDL, LDLDIRECT in the last 72 hours. Thyroid function studies:  Recent Labs  02/19/16 0448  TSH 0.878   Anemia work up: No results for input(s): VITAMINB12, FOLATE, FERRITIN, TIBC, IRON, RETICCTPCT in the last 72 hours. Sepsis Labs:  Recent Labs Lab 02/18/16 1416 02/18/16 1426 02/18/16 1630 02/19/16 0448 02/21/16 0620  WBC 25.9*  --   --  33.2* 17.1*  LATICACIDVEN  --  0.90 1.30  --   --    Microbiology Recent Results (from the past 240 hour(s))  Urine culture     Status: Abnormal   Collection Time: 02/18/16  3:35 PM  Result Value Ref Range Status   Specimen Description URINE, CLEAN CATCH  Final   Special Requests NONE  Final   Culture >=100,000 COLONIES/mL ESCHERICHIA COLI (A)  Final   Report Status 02/20/2016 FINAL  Final   Organism ID, Bacteria ESCHERICHIA COLI (A)  Final      Susceptibility   Escherichia coli - MIC*    AMPICILLIN <=2  SENSITIVE Sensitive     CEFAZOLIN <=4 SENSITIVE Sensitive     CEFTRIAXONE <=1 SENSITIVE Sensitive     CIPROFLOXACIN <=0.25 SENSITIVE Sensitive     GENTAMICIN <=1 SENSITIVE Sensitive     IMIPENEM <=0.25 SENSITIVE Sensitive     NITROFURANTOIN <=16 SENSITIVE Sensitive     TRIMETH/SULFA <=20 SENSITIVE Sensitive  AMPICILLIN/SULBACTAM <=2 SENSITIVE Sensitive     PIP/TAZO <=4 SENSITIVE Sensitive     Extended ESBL NEGATIVE Sensitive     * >=100,000 COLONIES/mL ESCHERICHIA COLI  Blood culture (routine x 2)     Status: Abnormal   Collection Time: 02/18/16  4:20 PM  Result Value Ref Range Status   Specimen Description BLOOD RIGHT ANTECUBITAL  Final   Special Requests BOTTLES DRAWN AEROBIC ONLY 5CC  Final   Culture  Setup Time   Final    GRAM NEGATIVE RODS AEROBIC BOTTLE ONLY CRITICAL RESULT CALLED TO, READ BACK BY AND VERIFIED WITH: C. SUMME, PHARMD (WL) AT 1930 ON 02/19/16 BY C. JESSUP, MLT. Performed at [facility] (A)  Final   Report Status 02/21/2016 FINAL  Final   Organism ID, Bacteria ESCHERICHIA COLI  Final      Susceptibility   Escherichia coli - MIC*    AMPICILLIN <=2 SENSITIVE Sensitive     CEFAZOLIN <=4 SENSITIVE Sensitive     CEFEPIME <=1 SENSITIVE Sensitive     CEFTAZIDIME <=1 SENSITIVE Sensitive     CEFTRIAXONE <=1 SENSITIVE Sensitive     CIPROFLOXACIN <=0.25 SENSITIVE Sensitive     GENTAMICIN <=1 SENSITIVE Sensitive     IMIPENEM <=0.25 SENSITIVE Sensitive     TRIMETH/SULFA <=20 SENSITIVE Sensitive     AMPICILLIN/SULBACTAM <=2 SENSITIVE Sensitive     PIP/TAZO <=4 SENSITIVE Sensitive     Extended ESBL NEGATIVE Sensitive     * ESCHERICHIA COLI  Blood Culture ID Panel (Reflexed)     Status: Abnormal   Collection Time: 02/18/16  4:20 PM  Result Value Ref Range Status   Enterococcus species NOT DETECTED NOT DETECTED Final   Listeria monocytogenes NOT DETECTED NOT DETECTED Final   Staphylococcus species NOT DETECTED NOT DETECTED Final     Staphylococcus aureus NOT DETECTED NOT DETECTED Final   Streptococcus species NOT DETECTED NOT DETECTED Final   Streptococcus agalactiae NOT DETECTED NOT DETECTED Final   Streptococcus pneumoniae NOT DETECTED NOT DETECTED Final   Streptococcus pyogenes NOT DETECTED NOT DETECTED Final   Acinetobacter baumannii NOT DETECTED NOT DETECTED Final   Enterobacteriaceae species DETECTED (A) NOT DETECTED Final    Comment: CRITICAL RESULT CALLED TO, READ BACK BY AND VERIFIED WITH: C. SUMME, PHARMD ([facility]) AT 1930 ON 02/19/16 BY C. JESSUP, MLT.    Enterobacter cloacae complex NOT DETECTED NOT DETECTED Final   Escherichia coli DETECTED (A) NOT DETECTED Final    Comment: CRITICAL RESULT CALLED TO, READ BACK BY AND VERIFIED WITH: C. SUMME, PHARMD ([facility]) AT 1930 ON 02/19/16 BY C. JESSUP, MLT.    Klebsiella oxytoca NOT DETECTED NOT DETECTED Final   Klebsiella pneumoniae NOT DETECTED NOT DETECTED Final   Proteus species NOT DETECTED NOT DETECTED Final   Serratia marcescens NOT DETECTED NOT DETECTED Final   Carbapenem resistance NOT DETECTED NOT DETECTED Final   Haemophilus influenzae NOT DETECTED NOT DETECTED Final   Neisseria meningitidis NOT DETECTED NOT DETECTED Final   Pseudomonas aeruginosa NOT DETECTED NOT DETECTED Final   Candida albicans NOT DETECTED NOT DETECTED Final   Candida glabrata NOT DETECTED NOT DETECTED Final   Candida krusei NOT DETECTED NOT DETECTED Final   Candida parapsilosis NOT DETECTED NOT DETECTED Final   Candida tropicalis NOT DETECTED NOT DETECTED Final    Comment: Performed at [facility]  Blood culture (routine x 2)     Status: Abnormal   Collection Time: 02/18/16  5:00 PM  Result Value  Ref Range Status   Specimen Description BLOOD LEFT ANTECUBITAL  Final   Special Requests IN PEDIATRIC BOTTLE 2 CC  Final   Culture  Setup Time   Final    GRAM POSITIVE COCCI IN CLUSTERS IN PEDIATRIC BOTTLE CRITICAL RESULT CALLED TO, READ BACK BY AND VERIFIED WITH: T. Pickering  Pharm.D. 9:55 02/20/16 ([email or web address])    Culture (A)  Final    STAPHYLOCOCCUS SPECIES (COAGULASE NEGATIVE) THE SIGNIFICANCE OF ISOLATING THIS ORGANISM FROM A SINGLE SET OF BLOOD CULTURES WHEN MULTIPLE SETS ARE DRAWN IS UNCERTAIN. PLEASE NOTIFY THE MICROBIOLOGY DEPARTMENT WITHIN ONE WEEK IF SPECIATION AND SENSITIVITIES ARE REQUIRED. Performed at [facility]    Report Status 02/21/2016 FINAL  Final  Blood Culture ID Panel (Reflexed)     Status: Abnormal   Collection Time: 02/18/16  5:00 PM  Result Value Ref Range Status   Enterococcus species NOT DETECTED NOT DETECTED Final   Listeria monocytogenes NOT DETECTED NOT DETECTED Final   Staphylococcus species DETECTED (A) NOT DETECTED Final    Comment: CRITICAL RESULT CALLED TO, READ BACK BY AND VERIFIED WITH: T. Pickering Pharm.D. 9:55 02/20/16 ([email or web address])    Staphylococcus aureus NOT DETECTED NOT DETECTED Final   Methicillin resistance NOT DETECTED NOT DETECTED Final   Streptococcus species NOT DETECTED NOT DETECTED Final   Streptococcus agalactiae NOT DETECTED NOT DETECTED Final   Streptococcus pneumoniae NOT DETECTED NOT DETECTED Final   Streptococcus pyogenes NOT DETECTED NOT DETECTED Final   Acinetobacter baumannii NOT DETECTED NOT DETECTED Final   Enterobacteriaceae species NOT DETECTED NOT DETECTED Final   Enterobacter cloacae complex NOT DETECTED NOT DETECTED Final   Escherichia coli NOT DETECTED NOT DETECTED Final   Klebsiella oxytoca NOT DETECTED NOT DETECTED Final   Klebsiella pneumoniae NOT DETECTED NOT DETECTED Final   Proteus species NOT DETECTED NOT DETECTED Final   Serratia marcescens NOT DETECTED NOT DETECTED Final   Haemophilus influenzae NOT DETECTED NOT DETECTED Final   Neisseria meningitidis NOT DETECTED NOT DETECTED Final   Pseudomonas aeruginosa NOT DETECTED NOT DETECTED Final   Candida albicans NOT DETECTED NOT DETECTED Final   Candida glabrata NOT DETECTED NOT DETECTED Final   Candida krusei NOT DETECTED NOT  DETECTED Final   Candida parapsilosis NOT DETECTED NOT DETECTED Final   Candida tropicalis NOT DETECTED NOT DETECTED Final    Comment: Performed at [facility]     Medications:   . aspirin EC  81 mg Oral Daily  . atorvastatin  10 mg Oral Daily  . cefTRIAXone (ROCEPHIN)  IV  2 g Intravenous Q24H  . citalopram  10 mg Oral Daily  . cloNIDine  0.2 mg Transdermal Weekly  . enoxaparin (LOVENOX) injection  40 mg Subcutaneous QHS  . feeding supplement (GLUCERNA SHAKE)  237 mL Oral Q24H  . insulin aspart  0-5 Units Subcutaneous QHS  . insulin aspart  0-9 Units Subcutaneous TID WC  . insulin glargine  10 Units Subcutaneous QHS  . isosorbide mononitrate  60 mg Oral Daily  . labetalol  100 mg Oral BID  . nystatin  1 g Topical QID  . potassium chloride  10 mEq Intravenous Q1 Hr x 5  . risperiDONE  0.5 mg Oral QHS   Continuous Infusions:    Time spent: 25 min   LOS: 2 days   [facility] Pager [phone]  *Please refer to [email or web address], password [email or web address] to get updated schedule on who will round on this patient, as hospitalists switch teams weekly. If 7PM-7AM,  please contact night-coverage at [email or web address], password [email or web address] for any overnight needs.  02/21/2016, 12:42 PM

## 2016-02-22 LAB — BASIC METABOLIC PANEL
Anion gap: 7 (ref 5–15)
BUN: 13 mg/dL (ref 6–20)
CHLORIDE: 111 mmol/L (ref 101–111)
CO2: 26 mmol/L (ref 22–32)
CREATININE: 0.87 mg/dL (ref 0.44–1.00)
Calcium: 8.6 mg/dL — ABNORMAL LOW (ref 8.9–10.3)
GFR calc Af Amer: >60 mL/min (ref >60)
GFR calc non Af Amer: >60 mL/min (ref >60)
Glucose, Bld: 169 mg/dL — ABNORMAL HIGH (ref 65–99)
Potassium: 3.1 mmol/L — ABNORMAL LOW (ref 3.5–5.1)
SODIUM: 144 mmol/L (ref 135–145)

## 2016-02-22 LAB — GLUCOSE, CAPILLARY
GLUCOSE-CAPILLARY: 96 mg/dL (ref 65–99)
Glucose-Capillary: 136 mg/dL — ABNORMAL HIGH (ref 65–99)
Glucose-Capillary: 102 mg/dL — ABNORMAL HIGH (ref 65–99)
Glucose-Capillary: 100 mg/dL — ABNORMAL HIGH (ref 65–99)

## 2016-02-22 LAB — CBC
HCT: 31.8 % — ABNORMAL LOW (ref 36.0–46.0)
HEMOGLOBIN: 10.2 g/dL — AB (ref 12.0–15.0)
MCH: 27.5 pg (ref 26.0–34.0)
MCHC: 32.1 g/dL (ref 30.0–36.0)
MCV: 85.7 fL (ref 78.0–100.0)
PLATELETS: 278 10*3/uL (ref 150–400)
RBC: 3.71 MIL/uL — ABNORMAL LOW (ref 3.87–5.11)
RDW: 14.5 % (ref 11.5–15.5)
WBC: 15.4 10*3/uL — ABNORMAL HIGH (ref 4.0–10.5)

## 2016-02-22 LAB — MAGNESIUM: MAGNESIUM: 1.8 mg/dL (ref 1.7–2.4)

## 2016-02-22 MED ORDER — POTASSIUM CHLORIDE 10 MEQ/100ML IV SOLN
10.0000 meq | INTRAVENOUS | Status: AC
  Administered 2016-02-22 (×5): 10 meq via INTRAVENOUS
  Filled 2016-02-22: qty 100

## 2016-02-22 MED ORDER — LABETALOL HCL 5 MG/ML IV SOLN
10.0000 mg | INTRAVENOUS | Status: DC | PRN
  Administered 2016-02-22 – 2016-03-03 (×4): 10 mg via INTRAVENOUS
  Filled 2016-02-22 (×5): qty 4

## 2016-02-22 MED ORDER — AMLODIPINE BESYLATE 5 MG PO TABS: 5.0000 mg | ORAL_TABLET | Freq: Every day | ORAL | Status: DC

## 2016-02-22 MED ORDER — HYDRALAZINE HCL 20 MG/ML IJ SOLN
5.0000 mg | Freq: Four times a day (QID) | INTRAMUSCULAR | Status: DC
  Administered 2016-02-22 – 2016-02-23 (×3): 5 mg via INTRAVENOUS
  Filled 2016-02-22 (×3): qty 1

## 2016-02-22 MED ORDER — ASPIRIN 300 MG RE SUPP
300.0000 mg | Freq: Every day | RECTAL | Status: DC
  Administered 2016-02-23 – 2016-03-03 (×10): 300 mg via RECTAL
  Filled 2016-02-22 (×12): qty 1

## 2016-02-22 MED ORDER — HYDRALAZINE HCL 20 MG/ML IJ SOLN
10.0000 mg | Freq: Once | INTRAMUSCULAR | Status: AC
  Administered 2016-02-22: 10 mg via INTRAVENOUS
  Filled 2016-02-22: qty 1

## 2016-02-22 NOTE — Progress Notes (Addendum)
LCSWA spoke to Mudlogger of Med Tech at [facility] [employer]. She reports the patient can receive fluids through IV but it has to be manage through their homeHealth agency-[facility]. RN notified, for MD to make orders.  Update: LCSWA received call from facility director at ALF, she reports she does not feel safe with patient returning to ALF with IV at this time because the patient will not get the one on one treatment she needs.  LCSWA spoke with patient daughter Katie Dawson and she is willing to send patient to SNF with IV, as she understands the patient has been refusing to take medication orally.  LCSWA will fax patient information to SNF and provide family with list.  Weekend CSW to follow up with patient and family.

## 2016-02-22 NOTE — Progress Notes (Signed)
Date:  February 22, 2016 Chart reviewed for concurrent status and case management needs. Will continue to follow the patient for status change:  Wbc 15.3  Iv abx and iv flds Discharge Planning: following for needs Expected discharge date: [id number] Velva Harman, [place], [place], [place]

## 2016-02-22 NOTE — Progress Notes (Signed)
[facility] PROGRESS NOTE    Progress Note  Katie Dawson  XYB:[id number] DOB: 04-20-1934 DOA: 02/18/2016 PCP: Cathlean Cower, MD     Brief Narrative:   Katie Dawson is an 82 y.o. female past medical history significant for metastatic renal cell carcinoma with advanced dementia and recurrent UTIs comes in for lethargy.  Assessment/Plan:   Acute kidney injury: In the setting of a UTI and probably mild dehydration. Likely prerenal in etiology she was started on IV fluid hydration her creatinine has improved back to baseline now.  Encephalopathy superimposed on advanced dementia in the setting of a UTI with ecoli bacteremia: She has baseline dementia with likely hospital delirium and infectious encephalopathy worsening the picture. She currently is refusing to take medications and this will be an issue with discharge going forward.  UTI with e coli bacteremia: IV Rocephin Trend CBC and repeat blood cultures should be 48 hours old today. She'll need 14 days of antibiotics and may need a PICC line if she continues to refuse PO meds.  Hypotension/essential hypertension: Currently resolved with holding antihypertensive medication, but now hypertensive as she's been refusing PO medications the last few days. Continue IV fluid hydration.  History of metastatic renal cell carcinoma with lung metastases: She is currently under hospice care but is a FULL CODE as per daughter she would never want to her mother to be a DNR. Family does not grasp the progression and prognosis of her renal cell carcinoma, palliative Care met with family and there has been no change  Diabetes mellitus type 2: Agree with holding glipizide continue sliding scale insulin. Blood glucose is well controlled.  Hypokalemia/hypomagnesium -repleted  Patient only taking in liquid items and refusing medications-- changed meds to liquid form or patched  DVT prophylaxis: lovenox Family Communication: daughter at  bedside 9/20 Disposition Plan/Barrier to D/C: ALF in 1-2 days, with hospice. But patient is refusing to take PO likely due to advanced dementia, but daughter wants her to get full care. In the current state patient is not taking her dementia medications, BP medications and we are unable to transition antibiotics to PO. We may have no option other than to send her to ALF with a PICC line via which she can get essential medications.  Code Status:     Code Status Orders        Start     Ordered   02/18/16 2322  Full code  Continuous     02/18/16 2321    Code Status History    Date Active Date Inactive Code Status Order ID Comments User Context   02/05/2016 12:28 AM 02/09/2016 10:21 PM Full Code [id number]  [name], MD Inpatient   09/13/2015  3:45 PM 09/17/2015  8:36 PM Full Code [id number]  [name], PA-C ED   04/05/2014  6:19 PM 04/07/2014  3:46 PM Full Code [id number]  [name], MD Inpatient   10/16/2013  4:35 PM 10/20/2013  7:17 PM Full Code [id number]  [name], MD Inpatient   05/16/2013 12:05 AM 05/18/2013  8:59 PM Full Code [id number]  [name], MD Inpatient        IV Access:    Peripheral IV   Procedures and diagnostic studies:   No results found.   Medical Consultants:    Palliative care  Anti-Infectives:   Rocephin  Subjective:    Feeling better, has no complaints. Resting comfortably, denies nausea, pain, etc.  Objective:    Vitals:  02/21/16 0620 02/21/16 1544 02/21/16 2021 02/22/16 0430  BP: (!) 148/54 (!) 183/77 (!) 186/102 (!) 167/89  Pulse:  84 88 84  Resp:  '18 20 18  ' Temp:  98.8 F (37.1 C) 98.3 F (36.8 C) 98.4 F (36.9 C)  TempSrc:  Oral Oral Oral  SpO2:  100% 100% 99%  Weight:      Height:        Intake/Output Summary (Last 24 hours) at 02/22/16 1004 Last data filed at 02/22/16 0430  Gross per 24 hour  Intake              110 ml  Output             1975 ml  Net            -1865 ml   Filed Weights     02/18/16 1250  Weight: 85.3 kg (188 lb)    Exam: General exam: In no acute distress, alert Respiratory system: Good air movement and clear to auscultation. Cardiovascular system: S1 & S2 heard, RRR. No JVD, murmurs, rubs, gallops or clicks.  Gastrointestinal system: Abdomen is nondistended, soft and nontender.  Extremities: No pedal edema. Skin: No rashes, lesions or ulcers  Data Reviewed:    Labs: Basic Metabolic Panel:  Recent Labs Lab 02/18/16 1416 02/19/16 0448 02/21/16 0620 02/22/16 0514  NA 141 146* 145 144  K 4.1 3.6 2.9* 3.1*  CL 109 117* 114* 111  CO2 '23 22 25 26  ' GLUCOSE 184* 118* 142* 169*  BUN 52* 44* 15 13  CREATININE 2.36* 1.67* 0.87 0.87  CALCIUM 9.3 8.6* 8.7* 8.6*  MG  --  1.5*  --  1.8  PHOS  --  3.4  --   --    GFR Estimated Creatinine Clearance: 56.5 mL/min (by C-G formula based on SCr of 0.87 mg/dL). Liver Function Tests:  Recent Labs Lab 02/18/16 1416 02/19/16 0448  AST 14* 13*  ALT 22 18  ALKPHOS 87 86  BILITOT 0.6 0.3  PROT 6.7 5.5*  ALBUMIN 3.0* 2.3*   No results for input(s): LIPASE, AMYLASE in the last 168 hours. No results for input(s): AMMONIA in the last 168 hours. Coagulation profile No results for input(s): INR, PROTIME in the last 168 hours.  CBC:  Recent Labs Lab 02/18/16 1416 02/19/16 0448 02/21/16 0620 02/22/16 0514  WBC 25.9* 33.2* 17.1* 15.4*  NEUTROABS 23.9*  --   --   --   HGB 9.9* 8.5* 9.9* 10.2*  HCT 31.3* 26.2* 31.7* 31.8*  MCV 89.4 86.8 87.8 85.7  PLT 365 305 295 278   Cardiac Enzymes:  Recent Labs Lab 02/18/16 1416  TROPONINI <0.03   BNP (last 3 results) No results for input(s): PROBNP in the last 8760 hours. CBG:  Recent Labs Lab 02/21/16 0748 02/21/16 1146 02/21/16 1637 02/21/16 2025 02/22/16 0810  GLUCAP 150* 116* 143* 178* 136*   D-Dimer: No results for input(s): DDIMER in the last 72 hours. Hgb A1c: No results for input(s): HGBA1C in the last 72 hours. Lipid Profile: No  results for input(s): CHOL, HDL, LDLCALC, TRIG, CHOLHDL, LDLDIRECT in the last 72 hours. Thyroid function studies: No results for input(s): TSH, T4TOTAL, T3FREE, THYROIDAB in the last 72 hours.  Invalid input(s): FREET3 Anemia work up: No results for input(s): VITAMINB12, FOLATE, FERRITIN, TIBC, IRON, RETICCTPCT in the last 72 hours. Sepsis Labs:  Recent Labs Lab 02/18/16 1416 02/18/16 1426 02/18/16 1630 02/19/16 0448 02/21/16 0620 02/22/16 0514  WBC 25.9*  --   --  33.2* 17.1* 15.4*  LATICACIDVEN  --  0.90 1.30  --   --   --    Microbiology Recent Results (from the past 240 hour(s))  Urine culture     Status: Abnormal   Collection Time: 02/18/16  3:35 PM  Result Value Ref Range Status   Specimen Description URINE, CLEAN CATCH  Final   Special Requests NONE  Final   Culture >=100,000 COLONIES/mL ESCHERICHIA COLI (A)  Final   Report Status 02/20/2016 FINAL  Final   Organism ID, Bacteria ESCHERICHIA COLI (A)  Final      Susceptibility   Escherichia coli - MIC*    AMPICILLIN <=2 SENSITIVE Sensitive     CEFAZOLIN <=4 SENSITIVE Sensitive     CEFTRIAXONE <=1 SENSITIVE Sensitive     CIPROFLOXACIN <=0.25 SENSITIVE Sensitive     GENTAMICIN <=1 SENSITIVE Sensitive     IMIPENEM <=0.25 SENSITIVE Sensitive     NITROFURANTOIN <=16 SENSITIVE Sensitive     TRIMETH/SULFA <=20 SENSITIVE Sensitive     AMPICILLIN/SULBACTAM <=2 SENSITIVE Sensitive     PIP/TAZO <=4 SENSITIVE Sensitive     Extended ESBL NEGATIVE Sensitive     * >=100,000 COLONIES/mL ESCHERICHIA COLI  Blood culture (routine x 2)     Status: Abnormal   Collection Time: 02/18/16  4:20 PM  Result Value Ref Range Status   Specimen Description BLOOD RIGHT ANTECUBITAL  Final   Special Requests BOTTLES DRAWN AEROBIC ONLY 5CC  Final   Culture  Setup Time   Final    GRAM NEGATIVE RODS AEROBIC BOTTLE ONLY CRITICAL RESULT CALLED TO, READ BACK BY AND VERIFIED WITH: C. SUMME, PHARMD (WL) AT 1930 ON 02/19/16 BY C. JESSUP,  MLT. Performed at [facility] (A)  Final   Report Status 02/21/2016 FINAL  Final   Organism ID, Bacteria ESCHERICHIA COLI  Final      Susceptibility   Escherichia coli - MIC*    AMPICILLIN <=2 SENSITIVE Sensitive     CEFAZOLIN <=4 SENSITIVE Sensitive     CEFEPIME <=1 SENSITIVE Sensitive     CEFTAZIDIME <=1 SENSITIVE Sensitive     CEFTRIAXONE <=1 SENSITIVE Sensitive     CIPROFLOXACIN <=0.25 SENSITIVE Sensitive     GENTAMICIN <=1 SENSITIVE Sensitive     IMIPENEM <=0.25 SENSITIVE Sensitive     TRIMETH/SULFA <=20 SENSITIVE Sensitive     AMPICILLIN/SULBACTAM <=2 SENSITIVE Sensitive     PIP/TAZO <=4 SENSITIVE Sensitive     Extended ESBL NEGATIVE Sensitive     * ESCHERICHIA COLI  Blood Culture ID Panel (Reflexed)     Status: Abnormal   Collection Time: 02/18/16  4:20 PM  Result Value Ref Range Status   Enterococcus species NOT DETECTED NOT DETECTED Final   Listeria monocytogenes NOT DETECTED NOT DETECTED Final   Staphylococcus species NOT DETECTED NOT DETECTED Final   Staphylococcus aureus NOT DETECTED NOT DETECTED Final   Streptococcus species NOT DETECTED NOT DETECTED Final   Streptococcus agalactiae NOT DETECTED NOT DETECTED Final   Streptococcus pneumoniae NOT DETECTED NOT DETECTED Final   Streptococcus pyogenes NOT DETECTED NOT DETECTED Final   Acinetobacter baumannii NOT DETECTED NOT DETECTED Final   Enterobacteriaceae species DETECTED (A) NOT DETECTED Final    Comment: CRITICAL RESULT CALLED TO, READ BACK BY AND VERIFIED WITH: C. SUMME, PHARMD ([place]) AT 1930 ON 02/19/16 BY C. JESSUP, MLT.    Enterobacter cloacae complex NOT DETECTED NOT DETECTED Final   Escherichia coli DETECTED (A) NOT DETECTED Final  Comment: CRITICAL RESULT CALLED TO, READ BACK BY AND VERIFIED WITH: C. SUMME, PHARMD ([place]) AT 1930 ON 02/19/16 BY C. JESSUP, MLT.    Klebsiella oxytoca NOT DETECTED NOT DETECTED Final   Klebsiella pneumoniae NOT DETECTED NOT DETECTED Final    Proteus species NOT DETECTED NOT DETECTED Final   Serratia marcescens NOT DETECTED NOT DETECTED Final   Carbapenem resistance NOT DETECTED NOT DETECTED Final   Haemophilus influenzae NOT DETECTED NOT DETECTED Final   Neisseria meningitidis NOT DETECTED NOT DETECTED Final   Pseudomonas aeruginosa NOT DETECTED NOT DETECTED Final   Candida albicans NOT DETECTED NOT DETECTED Final   Candida glabrata NOT DETECTED NOT DETECTED Final   Candida krusei NOT DETECTED NOT DETECTED Final   Candida parapsilosis NOT DETECTED NOT DETECTED Final   Candida tropicalis NOT DETECTED NOT DETECTED Final    Comment: Performed at [facility]  Blood culture (routine x 2)     Status: Abnormal   Collection Time: 02/18/16  5:00 PM  Result Value Ref Range Status   Specimen Description BLOOD LEFT ANTECUBITAL  Final   Special Requests IN PEDIATRIC BOTTLE 2 CC  Final   Culture  Setup Time   Final    GRAM POSITIVE COCCI IN CLUSTERS IN PEDIATRIC BOTTLE CRITICAL RESULT CALLED TO, READ BACK BY AND VERIFIED WITH: T. Pickering Pharm.D. 9:55 02/20/16 ([email or web address])    Culture (A)  Final    STAPHYLOCOCCUS SPECIES (COAGULASE NEGATIVE) THE SIGNIFICANCE OF ISOLATING THIS ORGANISM FROM A SINGLE SET OF BLOOD CULTURES WHEN MULTIPLE SETS ARE DRAWN IS UNCERTAIN. PLEASE NOTIFY THE MICROBIOLOGY DEPARTMENT WITHIN ONE WEEK IF SPECIATION AND SENSITIVITIES ARE REQUIRED. Performed at [facility]    Report Status 02/21/2016 FINAL  Final  Blood Culture ID Panel (Reflexed)     Status: Abnormal   Collection Time: 02/18/16  5:00 PM  Result Value Ref Range Status   Enterococcus species NOT DETECTED NOT DETECTED Final   Listeria monocytogenes NOT DETECTED NOT DETECTED Final   Staphylococcus species DETECTED (A) NOT DETECTED Final    Comment: CRITICAL RESULT CALLED TO, READ BACK BY AND VERIFIED WITH: T. Pickering Pharm.D. 9:55 02/20/16 ([email or web address])    Staphylococcus aureus NOT DETECTED NOT DETECTED Final   Methicillin resistance NOT  DETECTED NOT DETECTED Final   Streptococcus species NOT DETECTED NOT DETECTED Final   Streptococcus agalactiae NOT DETECTED NOT DETECTED Final   Streptococcus pneumoniae NOT DETECTED NOT DETECTED Final   Streptococcus pyogenes NOT DETECTED NOT DETECTED Final   Acinetobacter baumannii NOT DETECTED NOT DETECTED Final   Enterobacteriaceae species NOT DETECTED NOT DETECTED Final   Enterobacter cloacae complex NOT DETECTED NOT DETECTED Final   Escherichia coli NOT DETECTED NOT DETECTED Final   Klebsiella oxytoca NOT DETECTED NOT DETECTED Final   Klebsiella pneumoniae NOT DETECTED NOT DETECTED Final   Proteus species NOT DETECTED NOT DETECTED Final   Serratia marcescens NOT DETECTED NOT DETECTED Final   Haemophilus influenzae NOT DETECTED NOT DETECTED Final   Neisseria meningitidis NOT DETECTED NOT DETECTED Final   Pseudomonas aeruginosa NOT DETECTED NOT DETECTED Final   Candida albicans NOT DETECTED NOT DETECTED Final   Candida glabrata NOT DETECTED NOT DETECTED Final   Candida krusei NOT DETECTED NOT DETECTED Final   Candida parapsilosis NOT DETECTED NOT DETECTED Final   Candida tropicalis NOT DETECTED NOT DETECTED Final    Comment: Performed at [facility]  Culture, blood (Routine X 2) w Reflex to ID Panel     Status: None (Preliminary result)   Collection  Time: 02/20/16 11:47 AM  Result Value Ref Range Status   Specimen Description BLOOD RIGHT ARM  Final   Special Requests BOTTLES DRAWN AEROBIC AND ANAEROBIC 10CC  Final   Culture   Final    NO GROWTH < 24 HOURS Performed at [facility]    Report Status PENDING  Incomplete  Culture, blood (Routine X 2) w Reflex to ID Panel     Status: None (Preliminary result)   Collection Time: 02/20/16 11:48 AM  Result Value Ref Range Status   Specimen Description BLOOD RIGHT HAND  Final   Special Requests IN PEDIATRIC BOTTLE [place]  Final   Culture   Final    NO GROWTH < 24 HOURS Performed at [facility]    Report Status  PENDING  Incomplete     Medications:   . aspirin EC  81 mg Oral Daily  . atorvastatin  10 mg Oral Daily  . cefTRIAXone (ROCEPHIN)  IV  2 g Intravenous Q24H  . citalopram  10 mg Oral Daily  . cloNIDine  0.2 mg Transdermal Weekly  . enoxaparin (LOVENOX) injection  40 mg Subcutaneous QHS  . feeding supplement (GLUCERNA SHAKE)  237 mL Oral Q24H  . insulin aspart  0-5 Units Subcutaneous QHS  . insulin aspart  0-9 Units Subcutaneous TID WC  . insulin glargine  10 Units Subcutaneous QHS  . isosorbide mononitrate  60 mg Oral Daily  . labetalol  100 mg Oral BID  . nystatin  1 g Topical QID  . potassium chloride  10 mEq Intravenous Q1 Hr x 5  . risperiDONE  0.5 mg Oral QHS   Continuous Infusions:    Time spent: 21 minutes   LOS: 3 days   Mir Marry Guan  [facility] Pager [phone]  *Please refer to [email or web address], password [email or web address] to get updated schedule on who will round on this patient, as hospitalists switch teams weekly. If 7PM-7AM, please contact night-coverage at [email or web address], password [email or web address] for any overnight needs.  02/22/2016, 10:04 AM

## 2016-02-23 LAB — BASIC METABOLIC PANEL
ANION GAP: 8 (ref 5–15)
BUN: 15 mg/dL (ref 6–20)
CALCIUM: 9.1 mg/dL (ref 8.9–10.3)
CO2: 26 mmol/L (ref 22–32)
Chloride: 112 mmol/L — ABNORMAL HIGH (ref 101–111)
Creatinine, Ser: 0.87 mg/dL (ref 0.44–1.00)
Glucose, Bld: 119 mg/dL — ABNORMAL HIGH (ref 65–99)
Potassium: 3.5 mmol/L (ref 3.5–5.1)
Sodium: 146 mmol/L — ABNORMAL HIGH (ref 135–145)

## 2016-02-23 LAB — GLUCOSE, CAPILLARY
GLUCOSE-CAPILLARY: 118 mg/dL — AB (ref 65–99)
GLUCOSE-CAPILLARY: 165 mg/dL — AB (ref 65–99)
GLUCOSE-CAPILLARY: 160 mg/dL — AB (ref 65–99)
Glucose-Capillary: 127 mg/dL — ABNORMAL HIGH (ref 65–99)

## 2016-02-23 MED ORDER — HYDRALAZINE HCL 20 MG/ML IJ SOLN
10.0000 mg | Freq: Four times a day (QID) | INTRAMUSCULAR | Status: DC
  Administered 2016-02-23 – 2016-02-27 (×16): 10 mg via INTRAVENOUS
  Filled 2016-02-23 (×16): qty 1

## 2016-02-23 MED ORDER — SODIUM CHLORIDE 0.9% FLUSH
10.0000 mL | INTRAVENOUS | Status: DC | PRN
  Administered 2016-02-24 – 2016-03-01 (×7): 10 mL
  Filled 2016-02-23 (×7): qty 40

## 2016-02-23 MED ORDER — DEXTROSE-NACL 5-0.9 % IV SOLN
INTRAVENOUS | Status: DC
  Administered 2016-02-23 – 2016-02-24 (×2): via INTRAVENOUS

## 2016-02-23 MED ORDER — SODIUM CHLORIDE 0.9% FLUSH
10.0000 mL | Freq: Two times a day (BID) | INTRAVENOUS | Status: DC
  Administered 2016-02-24 – 2016-03-02 (×6): 10 mL

## 2016-02-23 MED ORDER — CLONIDINE HCL 0.3 MG/24HR TD PTWK
0.3000 mg | MEDICATED_PATCH | TRANSDERMAL | Status: DC
  Administered 2016-02-28: 0.3 mg via TRANSDERMAL
  Filled 2016-02-23: qty 1

## 2016-02-23 NOTE — Progress Notes (Signed)
Peripherally Inserted Central Catheter/Midline Placement  The IV Nurse has discussed with the patient and/or persons authorized to consent for the patient, the purpose of this procedure and the potential benefits and risks involved with this procedure.  The benefits include less needle sticks, lab draws from the catheter, and the patient may be discharged home with the catheter. Risks include, but not limited to, infection, bleeding, blood clot (thrombus formation), and puncture of an artery; nerve damage and irregular heartbeat and possibility to perform a PICC exchange if needed/ordered by physician.  Alternatives to this procedure were also discussed.  Bard Power PICC patient education guide, fact sheet on infection prevention and patient information card has been provided to patient /or left at bedside.    PICC/Midline Placement Documentation  PICC Single Lumen [id number] PICC Right Basilic 38 cm 1 cm (Active)  Indication for Insertion or Continuance of Line Home intravenous therapies (PICC only) 02/23/2016  6:32 PM  Exposed Catheter (cm) 1 cm 02/23/2016  6:32 PM  Site Assessment Clean;Dry;Intact 02/23/2016  6:32 PM  Line Status Flushed;Saline locked;Blood return noted 02/23/2016  6:32 PM  Dressing Type Transparent 02/23/2016  6:32 PM  Dressing Status Clean;Dry;Intact;Antimicrobial disc in place 02/23/2016  6:32 PM  Dressing Change Due 03/01/16 02/23/2016  6:32 PM       [name] 02/23/2016, 6:33 PM

## 2016-02-23 NOTE — Clinical Social Work Placement (Signed)
CSW called patient's daughter, [name] (ph#: [phone]) to provide SNF bed offers - at this time only [facility] [place] [facility] have offered. [name] states that [facility] is out of the question, but plans to [place]. CSW will follow-up with SNF decision.    Raynaldo Opitz, LCSW [facility] Clinical Social Worker     CLINICAL SOCIAL WORK PLACEMENT  NOTE  Date:  02/23/2016  Patient Details  Name: Katie Dawson MRN: [id number] Date of Birth: 01/23/1934  Clinical Social Work is seeking post-discharge placement for this patient at the [place] level of care (*CSW will initial, date and re-position this form in  chart as items are completed):  Yes   Patient/family provided with [place] Work Department's list of facilities offering this level of care within the geographic area requested by the patient (or if unable, by the patient's family).  Yes   Patient/family informed of their freedom to choose among providers that offer the needed level of care, that participate in Medicare, Medicaid or managed care program needed by the patient, have an available bed and are willing to accept the patient.  Yes   Patient/family informed of [place]'s ownership interest in [facility] and [facility], as well as of the fact that they are under no obligation to receive care at these facilities.  PASRR submitted to EDS on       PASRR number received on       Existing PASRR number confirmed on 02/23/16     FL2 transmitted to all facilities in geographic area requested by pt/family on 02/23/16     FL2 transmitted to all facilities within larger geographic area on       Patient informed that his/her managed care company has contracts with or will negotiate with certain facilities, including the following:        Yes   Patient/family informed of bed offers received.  Patient  chooses bed at       Physician recommends and patient chooses bed at      Patient to be transferred to   on  .  Patient to be transferred to facility by       Patient family notified on   of transfer.  Name of family member notified:        PHYSICIAN       Additional Comment:    _______________________________________________ Standley Brooking, LCSW 02/23/2016, 2:53 PM

## 2016-02-23 NOTE — Progress Notes (Signed)
[facility] PROGRESS NOTE    Progress Note  Katie Dawson  ENI:[id number] DOB: Feb 07, 1934 DOA: 02/18/2016 PCP: Cathlean Cower, MD     Brief Narrative:   Katie Dawson is an 82 y.o. female past medical history significant for metastatic renal cell carcinoma with advanced dementia and recurrent UTIs comes in for lethargy.  Assessment/Plan:   Acute kidney injury: In the setting of a UTI and probably mild dehydration. Likely prerenal in etiology she was started on IV fluid hydration her creatinine has improved back to baseline, will recheck renal function intermittently.  Encephalopathy superimposed on advanced dementia in the setting of a UTI with ecoli bacteremia: She has baseline dementia with likely hospital delirium and infectious encephalopathy worsening the picture. She currently is refusing to take medications and I feel this is indicative of end stage dementia, but daughter is adamant that we need to continue treating her. As such, we will place PICC line and SW is working on SNF placement. Palliative care has been involved and unable to make much headway with family.  UTI with e coli bacteremia: IV Rocephin Trend CBC and repeat blood cultures should be 48 hours old today. She'll need 14 days of antibiotics, repeat blood cultures are negative x2 days.  Hypotension/essential hypertension: Currently resolved with holding antihypertensive medication, but now hypertensive as she's been refusing PO medications the last few days. Increase clonidine patch, hydralazine scheduled. Continue PRN IV labetalol Will reduce IV hydration.  History of metastatic renal cell carcinoma with lung metastases: She is currently under hospice care but is a FULL CODE as per daughter she would never want to her mother to be a DNR. Family does not grasp the progression and prognosis of her renal cell carcinoma, palliative Care met with family and there has been no change  Diabetes mellitus type  2: Agree with holding glipizide continue sliding scale insulin. Blood glucose is well controlled.  Hypokalemia/hypomagnesium -repleted, recheck today  Patient only taking in liquid items and refusing medications-- changed meds to liquid form or patched  DVT prophylaxis: lovenox Family Communication: daughter at bedside 9/20 Disposition Plan/Barrier to D/C: ALF in 1-2 days, with hospice. But patient is refusing to take PO likely due to advanced dementia, but daughter wants her to get full care. In the current state patient is not taking her dementia medications, BP medications and we are unable to transition antibiotics to PO. We may have no option other than to send her to ALF with a PICC line via which she can get essential medications.  Code Status:     Code Status Orders        Start     Ordered   02/18/16 2322  Full code  Continuous     02/18/16 2321    Code Status History    Date Active Date Inactive Code Status Order ID Comments User Context   02/05/2016 12:28 AM 02/09/2016 10:21 PM Full Code [id number]  [name], MD Inpatient   09/13/2015  3:45 PM 09/17/2015  8:36 PM Full Code [id number]  [name], PA-C ED   04/05/2014  6:19 PM 04/07/2014  3:46 PM Full Code [id number]  [name], MD Inpatient   10/16/2013  4:35 PM 10/20/2013  7:17 PM Full Code [id number]  [name], MD Inpatient   05/16/2013 12:05 AM 05/18/2013  8:59 PM Full Code [id number]  [name], MD Inpatient        IV Access:    Peripheral IV  Procedures and diagnostic studies:   No results found.   Medical Consultants:    Palliative care  Anti-Infectives:   Rocephin  Subjective:    Feeling better, has no complaints. Resting comfortably, denies nausea, pain, etc.  Objective:    Vitals:   02/22/16 2111 02/22/16 2300 02/23/16 0150 02/23/16 0620  BP: (!) 206/85 (!) 191/86 (!) 164/80 (!) 205/90  Pulse:    96  Resp: 18   20  Temp: 97.4 F (36.3 C)   99.4 F (37.4  C)  TempSrc: Oral   Oral  SpO2:    98%  Weight:      Height:        Intake/Output Summary (Last 24 hours) at 02/23/16 1004 Last data filed at 02/22/16 1851  Gross per 24 hour  Intake              350 ml  Output             1200 ml  Net             -850 ml   Filed Weights   02/18/16 1250  Weight: 85.3 kg (188 lb)    Exam: General exam: In no acute distress, alert, not oriented Respiratory system: Good air movement and clear to auscultation. Cardiovascular system: S1 & S2 heard, RRR. No JVD, murmurs, rubs, gallops or clicks.  Gastrointestinal system: Abdomen is nondistended, soft and nontender.  Extremities: No pedal edema. Skin: No rashes, lesions or ulcers  Data Reviewed:    Labs: Basic Metabolic Panel:  Recent Labs Lab 02/18/16 1416 02/19/16 0448 02/21/16 0620 02/22/16 0514  NA 141 146* 145 144  K 4.1 3.6 2.9* 3.1*  CL 109 117* 114* 111  CO2 '23 22 25 26  ' GLUCOSE 184* 118* 142* 169*  BUN 52* 44* 15 13  CREATININE 2.36* 1.67* 0.87 0.87  CALCIUM 9.3 8.6* 8.7* 8.6*  MG  --  1.5*  --  1.8  PHOS  --  3.4  --   --    GFR Estimated Creatinine Clearance: 56.5 mL/min (by C-G formula based on SCr of 0.87 mg/dL). Liver Function Tests:  Recent Labs Lab 02/18/16 1416 02/19/16 0448  AST 14* 13*  ALT 22 18  ALKPHOS 87 86  BILITOT 0.6 0.3  PROT 6.7 5.5*  ALBUMIN 3.0* 2.3*   No results for input(s): LIPASE, AMYLASE in the last 168 hours. No results for input(s): AMMONIA in the last 168 hours. Coagulation profile No results for input(s): INR, PROTIME in the last 168 hours.  CBC:  Recent Labs Lab 02/18/16 1416 02/19/16 0448 02/21/16 0620 02/22/16 0514  WBC 25.9* 33.2* 17.1* 15.4*  NEUTROABS 23.9*  --   --   --   HGB 9.9* 8.5* 9.9* 10.2*  HCT 31.3* 26.2* 31.7* 31.8*  MCV 89.4 86.8 87.8 85.7  PLT 365 305 295 278   Cardiac Enzymes:  Recent Labs Lab 02/18/16 1416  TROPONINI <0.03   BNP (last 3 results) No results for input(s): PROBNP in the last  8760 hours. CBG:  Recent Labs Lab 02/22/16 0810 02/22/16 1126 02/22/16 1648 02/22/16 2228 02/23/16 0743  GLUCAP 136* 102* 100* 96 127*   D-Dimer: No results for input(s): DDIMER in the last 72 hours. Hgb A1c: No results for input(s): HGBA1C in the last 72 hours. Lipid Profile: No results for input(s): CHOL, HDL, LDLCALC, TRIG, CHOLHDL, LDLDIRECT in the last 72 hours. Thyroid function studies: No results for input(s): TSH, T4TOTAL, T3FREE, THYROIDAB in the last 72  hours.  Invalid input(s): FREET3 Anemia work up: No results for input(s): VITAMINB12, FOLATE, FERRITIN, TIBC, IRON, RETICCTPCT in the last 72 hours. Sepsis Labs:  Recent Labs Lab 02/18/16 1416 02/18/16 1426 02/18/16 1630 02/19/16 0448 02/21/16 0620 02/22/16 0514  WBC 25.9*  --   --  33.2* 17.1* 15.4*  LATICACIDVEN  --  0.90 1.30  --   --   --    Microbiology Recent Results (from the past 240 hour(s))  Urine culture     Status: Abnormal   Collection Time: 02/18/16  3:35 PM  Result Value Ref Range Status   Specimen Description URINE, CLEAN CATCH  Final   Special Requests NONE  Final   Culture >=100,000 COLONIES/mL ESCHERICHIA COLI (A)  Final   Report Status 02/20/2016 FINAL  Final   Organism ID, Bacteria ESCHERICHIA COLI (A)  Final      Susceptibility   Escherichia coli - MIC*    AMPICILLIN <=2 SENSITIVE Sensitive     CEFAZOLIN <=4 SENSITIVE Sensitive     CEFTRIAXONE <=1 SENSITIVE Sensitive     CIPROFLOXACIN <=0.25 SENSITIVE Sensitive     GENTAMICIN <=1 SENSITIVE Sensitive     IMIPENEM <=0.25 SENSITIVE Sensitive     NITROFURANTOIN <=16 SENSITIVE Sensitive     TRIMETH/SULFA <=20 SENSITIVE Sensitive     AMPICILLIN/SULBACTAM <=2 SENSITIVE Sensitive     PIP/TAZO <=4 SENSITIVE Sensitive     Extended ESBL NEGATIVE Sensitive     * >=100,000 COLONIES/mL ESCHERICHIA COLI  Blood culture (routine x 2)     Status: Abnormal   Collection Time: 02/18/16  4:20 PM  Result Value Ref Range Status   Specimen  Description BLOOD RIGHT ANTECUBITAL  Final   Special Requests BOTTLES DRAWN AEROBIC ONLY 5CC  Final   Culture  Setup Time   Final    GRAM NEGATIVE RODS AEROBIC BOTTLE ONLY CRITICAL RESULT CALLED TO, READ BACK BY AND VERIFIED WITH: C. SUMME, PHARMD ([place]) AT 1930 ON 02/19/16 BY C. JESSUP, MLT. Performed at [facility] (A)  Final   Report Status 02/21/2016 FINAL  Final   Organism ID, Bacteria ESCHERICHIA COLI  Final      Susceptibility   Escherichia coli - MIC*    AMPICILLIN <=2 SENSITIVE Sensitive     CEFAZOLIN <=4 SENSITIVE Sensitive     CEFEPIME <=1 SENSITIVE Sensitive     CEFTAZIDIME <=1 SENSITIVE Sensitive     CEFTRIAXONE <=1 SENSITIVE Sensitive     CIPROFLOXACIN <=0.25 SENSITIVE Sensitive     GENTAMICIN <=1 SENSITIVE Sensitive     IMIPENEM <=0.25 SENSITIVE Sensitive     TRIMETH/SULFA <=20 SENSITIVE Sensitive     AMPICILLIN/SULBACTAM <=2 SENSITIVE Sensitive     PIP/TAZO <=4 SENSITIVE Sensitive     Extended ESBL NEGATIVE Sensitive     * ESCHERICHIA COLI  Blood Culture ID Panel (Reflexed)     Status: Abnormal   Collection Time: 02/18/16  4:20 PM  Result Value Ref Range Status   Enterococcus species NOT DETECTED NOT DETECTED Final   Listeria monocytogenes NOT DETECTED NOT DETECTED Final   Staphylococcus species NOT DETECTED NOT DETECTED Final   Staphylococcus aureus NOT DETECTED NOT DETECTED Final   Streptococcus species NOT DETECTED NOT DETECTED Final   Streptococcus agalactiae NOT DETECTED NOT DETECTED Final   Streptococcus pneumoniae NOT DETECTED NOT DETECTED Final   Streptococcus pyogenes NOT DETECTED NOT DETECTED Final   Acinetobacter baumannii NOT DETECTED NOT DETECTED Final   Enterobacteriaceae species DETECTED (A) NOT  DETECTED Final    Comment: CRITICAL RESULT CALLED TO, READ BACK BY AND VERIFIED WITH: C. SUMME, PHARMD ([facility]) AT 1930 ON 02/19/16 BY C. JESSUP, MLT.    Enterobacter cloacae complex NOT DETECTED NOT DETECTED Final    Escherichia coli DETECTED (A) NOT DETECTED Final    Comment: CRITICAL RESULT CALLED TO, READ BACK BY AND VERIFIED WITH: C. SUMME, PHARMD ([facility]) AT 1930 ON 02/19/16 BY C. JESSUP, MLT.    Klebsiella oxytoca NOT DETECTED NOT DETECTED Final   Klebsiella pneumoniae NOT DETECTED NOT DETECTED Final   Proteus species NOT DETECTED NOT DETECTED Final   Serratia marcescens NOT DETECTED NOT DETECTED Final   Carbapenem resistance NOT DETECTED NOT DETECTED Final   Haemophilus influenzae NOT DETECTED NOT DETECTED Final   Neisseria meningitidis NOT DETECTED NOT DETECTED Final   Pseudomonas aeruginosa NOT DETECTED NOT DETECTED Final   Candida albicans NOT DETECTED NOT DETECTED Final   Candida glabrata NOT DETECTED NOT DETECTED Final   Candida krusei NOT DETECTED NOT DETECTED Final   Candida parapsilosis NOT DETECTED NOT DETECTED Final   Candida tropicalis NOT DETECTED NOT DETECTED Final    Comment: Performed at [facility]  Blood culture (routine x 2)     Status: Abnormal   Collection Time: 02/18/16  5:00 PM  Result Value Ref Range Status   Specimen Description BLOOD LEFT ANTECUBITAL  Final   Special Requests IN PEDIATRIC BOTTLE 2 CC  Final   Culture  Setup Time   Final    GRAM POSITIVE COCCI IN CLUSTERS IN PEDIATRIC BOTTLE CRITICAL RESULT CALLED TO, READ BACK BY AND VERIFIED WITH: T. Pickering Pharm.D. 9:55 02/20/16 ([email or web address])    Culture (A)  Final    STAPHYLOCOCCUS SPECIES (COAGULASE NEGATIVE) THE SIGNIFICANCE OF ISOLATING THIS ORGANISM FROM A SINGLE SET OF BLOOD CULTURES WHEN MULTIPLE SETS ARE DRAWN IS UNCERTAIN. PLEASE NOTIFY THE MICROBIOLOGY DEPARTMENT WITHIN ONE WEEK IF SPECIATION AND SENSITIVITIES ARE REQUIRED. Performed at [facility]    Report Status 02/21/2016 FINAL  Final  Blood Culture ID Panel (Reflexed)     Status: Abnormal   Collection Time: 02/18/16  5:00 PM  Result Value Ref Range Status   Enterococcus species NOT DETECTED NOT DETECTED Final   Listeria monocytogenes  NOT DETECTED NOT DETECTED Final   Staphylococcus species DETECTED (A) NOT DETECTED Final    Comment: CRITICAL RESULT CALLED TO, READ BACK BY AND VERIFIED WITH: T. Pickering Pharm.D. 9:55 02/20/16 ([email or web address])    Staphylococcus aureus NOT DETECTED NOT DETECTED Final   Methicillin resistance NOT DETECTED NOT DETECTED Final   Streptococcus species NOT DETECTED NOT DETECTED Final   Streptococcus agalactiae NOT DETECTED NOT DETECTED Final   Streptococcus pneumoniae NOT DETECTED NOT DETECTED Final   Streptococcus pyogenes NOT DETECTED NOT DETECTED Final   Acinetobacter baumannii NOT DETECTED NOT DETECTED Final   Enterobacteriaceae species NOT DETECTED NOT DETECTED Final   Enterobacter cloacae complex NOT DETECTED NOT DETECTED Final   Escherichia coli NOT DETECTED NOT DETECTED Final   Klebsiella oxytoca NOT DETECTED NOT DETECTED Final   Klebsiella pneumoniae NOT DETECTED NOT DETECTED Final   Proteus species NOT DETECTED NOT DETECTED Final   Serratia marcescens NOT DETECTED NOT DETECTED Final   Haemophilus influenzae NOT DETECTED NOT DETECTED Final   Neisseria meningitidis NOT DETECTED NOT DETECTED Final   Pseudomonas aeruginosa NOT DETECTED NOT DETECTED Final   Candida albicans NOT DETECTED NOT DETECTED Final   Candida glabrata NOT DETECTED NOT DETECTED Final   Candida krusei NOT DETECTED NOT  DETECTED Final   Candida parapsilosis NOT DETECTED NOT DETECTED Final   Candida tropicalis NOT DETECTED NOT DETECTED Final    Comment: Performed at [facility]  Culture, blood (Routine X 2) w Reflex to ID Panel     Status: None (Preliminary result)   Collection Time: 02/20/16 11:47 AM  Result Value Ref Range Status   Specimen Description BLOOD RIGHT ARM  Final   Special Requests BOTTLES DRAWN AEROBIC AND ANAEROBIC 10CC  Final   Culture   Final    NO GROWTH 2 DAYS Performed at [facility]    Report Status PENDING  Incomplete  Culture, blood (Routine X 2) w Reflex to ID Panel     Status:  None (Preliminary result)   Collection Time: 02/20/16 11:48 AM  Result Value Ref Range Status   Specimen Description BLOOD RIGHT HAND  Final   Special Requests IN PEDIATRIC BOTTLE [place]  Final   Culture   Final    NO GROWTH 2 DAYS Performed at [facility]    Report Status PENDING  Incomplete     Medications:   . aspirin  300 mg Rectal Daily  . cefTRIAXone (ROCEPHIN)  IV  2 g Intravenous Q24H  . [START ON 02/28/2016] cloNIDine  0.3 mg Transdermal Weekly  . enoxaparin (LOVENOX) injection  40 mg Subcutaneous QHS  . feeding supplement (GLUCERNA SHAKE)  237 mL Oral Q24H  . hydrALAZINE  10 mg Intravenous Q6H  . insulin aspart  0-5 Units Subcutaneous QHS  . insulin aspart  0-9 Units Subcutaneous TID WC  . insulin glargine  10 Units Subcutaneous QHS  . nystatin  1 g Topical QID   Continuous Infusions:    Time spent: 23 minutes   LOS: 4 days   Mir Marry Guan  [facility] Pager [phone]  *Please refer to [email or web address], password [email or web address] to get updated schedule on who will round on this patient, as hospitalists switch teams weekly. If 7PM-7AM, please contact night-coverage at [email or web address], password [email or web address] for any overnight needs.  02/23/2016, 10:04 AM

## 2016-02-24 LAB — CBC
HCT: 32.2 % — ABNORMAL LOW (ref 36.0–46.0)
Hemoglobin: 10.3 g/dL — ABNORMAL LOW (ref 12.0–15.0)
MCH: 27.5 pg (ref 26.0–34.0)
MCHC: 32 g/dL (ref 30.0–36.0)
MCV: 85.9 fL (ref 78.0–100.0)
PLATELETS: 341 10*3/uL (ref 150–400)
RBC: 3.75 MIL/uL — AB (ref 3.87–5.11)
RDW: 14.9 % (ref 11.5–15.5)
WBC: 27.3 10*3/uL — AB (ref 4.0–10.5)

## 2016-02-24 LAB — BASIC METABOLIC PANEL
ANION GAP: 10 (ref 5–15)
BUN: 18 mg/dL (ref 6–20)
CO2: 26 mmol/L (ref 22–32)
Calcium: 9.3 mg/dL (ref 8.9–10.3)
Chloride: 112 mmol/L — ABNORMAL HIGH (ref 101–111)
Creatinine, Ser: 0.91 mg/dL (ref 0.44–1.00)
GFR, EST NON AFRICAN AMERICAN: 58 mL/min — AB (ref >60)
Glucose, Bld: 210 mg/dL — ABNORMAL HIGH (ref 65–99)
POTASSIUM: 3.1 mmol/L — AB (ref 3.5–5.1)
SODIUM: 148 mmol/L — AB (ref 135–145)

## 2016-02-24 LAB — GLUCOSE, CAPILLARY
GLUCOSE-CAPILLARY: 168 mg/dL — AB (ref 65–99)
Glucose-Capillary: 205 mg/dL — ABNORMAL HIGH (ref 65–99)
Glucose-Capillary: 153 mg/dL — ABNORMAL HIGH (ref 65–99)
Glucose-Capillary: 156 mg/dL — ABNORMAL HIGH (ref 65–99)

## 2016-02-24 MED ORDER — HYDRALAZINE HCL 20 MG/ML IJ SOLN: 10.0000 mg | Freq: Four times a day (QID) | INTRAMUSCULAR | Status: DC

## 2016-02-24 MED ORDER — DEXTROSE 5 % IV SOLN: 2.0000 g | Freq: Every day | INTRAVENOUS | Status: DC

## 2016-02-24 MED ORDER — POTASSIUM CHLORIDE 10 MEQ/50ML IV SOLN
10.0000 meq | INTRAVENOUS | Status: AC
  Administered 2016-02-24 (×6): 10 meq via INTRAVENOUS
  Filled 2016-02-24 (×6): qty 50

## 2016-02-24 MED ORDER — GLUCERNA SHAKE PO LIQD: 237.0000 mL | ORAL | 0 refills | Status: AC

## 2016-02-24 MED ORDER — ACETAMINOPHEN 160 MG/5ML PO SOLN: 650.0000 mg | Freq: Four times a day (QID) | ORAL | 0 refills | Status: AC | PRN

## 2016-02-24 MED ORDER — ASPIRIN 300 MG RE SUPP: 300.0000 mg | Freq: Every day | RECTAL | 0 refills | Status: AC

## 2016-02-24 MED ORDER — LABETALOL HCL 5 MG/ML IV SOLN: 10.0000 mg | INTRAVENOUS | Status: DC | PRN

## 2016-02-24 MED ORDER — CLONIDINE HCL 0.3 MG/24HR TD PTWK: 0.3000 mg | MEDICATED_PATCH | TRANSDERMAL | 12 refills | Status: DC

## 2016-02-24 NOTE — Progress Notes (Addendum)
4pm CSW attempted to follow up with pt dtr Estill Bamberg in regards to choice- no answer- left voicemail  CSW informed RNCM who will also attempt to follow up and discuss DC/ provided medicare appeal number if appropriate.  CSW confirmed with [facility] on 9/24 that they had beds Sunday and could accept patient  2pm CSW informed of pt DC for today- CSW called pt dtr, Estill Bamberg, who states she is at work and has been unable to view facilities- pt dtr felt as if pt wasn't supposed to DC until Monday and is frustrated that pt is being pushed out without her having time to look at facilities.  CSW explained that if they did not pick facility today that insurance would likely not cover her stay until Monday.  Estill Bamberg is follow up with her sister and will get back with CSW this afternoon but reiterates that we cannot DC pt without consent and states she would rather deal with Medicare bill than send pt to facility she is not comfortable with  Jorge Ny, [place] Worker [phone]

## 2016-02-24 NOTE — Discharge Summary (Signed)
Discharge Summary  Katie Dawson:[id number] DOB: March 19, 1934  PCP: Cathlean Cower, MD  Admit date: 02/18/2016 Discharge date: 02/24/2016   Recommendations for Outpatient Follow-up:  1. Hospice services and SNF Medical Director   Discharge Diagnoses:  Active Hospital Problems   Diagnosis Date Noted  . Protein-calorie malnutrition, severe 02/21/2016  . Hypokalemia 02/21/2016  . Bacteremia   . Palliative care encounter   . DNR (do not resuscitate) discussion   . Goals of care, counseling/discussion   . DM type 2 causing CKD stage 3 ([place]) 02/18/2016  . Hypotension 02/18/2016  . AKI (acute kidney injury) ([place])   . Acute encephalopathy   . Complicated UTI (urinary tract infection) 10/17/2013  . Leucocytosis 05/15/2013  . Essential hypertension 04/22/2007  . ANEMIA-IRON DEFICIENCY 04/22/2007    Resolved Hospital Problems   Diagnosis Date Noted Date Resolved  No resolved problems to display.    Discharge Condition: Stable   Diet recommendation: As tolerated.   Vitals:   02/23/16 2330 02/24/16 0550  BP:  (!) 168/89  Pulse:  95  Resp:  20  Temp: 100 F (37.8 C) 99.6 F (37.6 C)    History of present illness:  Katie Dawson is an 82 y.o. female past medical history significant for metastatic renal cell carcinoma with advanced dementia and recurrent UTIs comes in for lethargy. She is being treated for UTI with bacteremia and has been refusing oral medications. PICC line was placed 9/23 in anticipation of SNF discharge on IV medications as family wants to keep patient full code and under full treatment for infections. She has known metastatic cancer for which she is comfort care.   Hospital Course:  Active Problems:   ANEMIA-IRON DEFICIENCY   Essential hypertension   Leucocytosis   Complicated UTI (urinary tract infection)   Acute encephalopathy   AKI (acute kidney injury) ([place])   DM type 2 causing CKD stage 3 ([facility])   Hypotension   Palliative care encounter   DNR  (do not resuscitate) discussion   Goals of care, counseling/discussion   Protein-calorie malnutrition, severe   Bacteremia   Hypokalemia  Acute kidney injury: In the setting of a UTI and probably mild dehydration. Likely prerenal in etiology she was started on IV fluid hydration her creatinine has improved back to baseline.  Encephalopathy superimposed on advanced dementia in the setting of a UTI with ecoli bacteremia: She has baseline dementia with likely hospital delirium and infectious encephalopathy worsening the picture. She currently is refusing to take medications and I feel this is indicative of end stage dementia, but daughter is adamant that we need to continue treating her. As such, placed PICC line and we have found SNF placement so she can get necessary medications via IV. Palliative care has been involved and unable to make much headway with family.  UTI with e coli bacteremia: IV Rocephin though 03/05/16. Note repeat blood cultures are negative. She'll need 14 days of antibiotics for her bacteremia.  Hypertension: Initially hypotensive, resolved with holding antihypertensive medication, but now hypertensive as she's been refusing PO medications the last few days. Increased clonidine patch, hydralazine scheduled. Continue PRN IV labetalol BP is overall improved now, and acceptable.  History of metastatic renal cell carcinoma with lung metastases: She is currently under hospice care but is a FULL CODE as per daughter she would never want to her mother to be a DNR. Family does not grasp the progression and prognosis of her renal cell carcinoma, palliative Care met with family and  there has been no change. Last oncology notes indicated family is aware of her diagnosis and understands no further treatment is available.   Diabetes mellitus type 2: Continue holding glipizide continue sliding scale insulin. Blood glucose is well  controlled.  Hypokalemia/hypomagnesium -repleted via IV today - would recheck and replete as needed  Procedures:  PICC line 9/23   Consultations:  None   Discharge Exam: BP (!) 168/89 (BP Location: Left Arm) Comment: notified nurse  Pulse 95   Temp 99.6 F (37.6 C) (Axillary)   Resp 20   Ht _0  (1.651 m)   Wt 85.3 kg (188 lb)   SpO2 98%   BMI 31.28 kg/m  General exam: In no acute distress, alert, not oriented Respiratory system: Good air movement and clear to auscultation. Cardiovascular system: S1 & S2 heard, RRR. No JVD, murmurs, rubs, gallops or clicks.  Gastrointestinal system: Abdomen is nondistended, soft and nontender.  Extremities: No pedal edema. Skin: No rashes, lesions or ulcers  Discharge Instructions You were cared for by a hospitalist during your hospital stay. If you have any questions about your discharge medications or the care you received while you were in the hospital after you are discharged, you can call the unit and asked to speak with the hospitalist on call if the hospitalist that took care of you is not available. Once you are discharged, your primary care physician will handle any further medical issues. Please note that NO REFILLS for any discharge medications will be authorized once you are discharged, as it is imperative that you return to your primary care physician (or establish a relationship with a primary care physician if you do not have one) for your aftercare needs so that they can reassess your need for medications and monitor your lab values.  Discharge Instructions    Diet - low sodium heart healthy    Complete by:  As directed    Increase activity slowly    Complete by:  As directed        Medication List    STOP taking these medications   acetaminophen 325 MG tablet Commonly known as:  TYLENOL   amLODipine 10 MG tablet Commonly known as:  NORVASC   aspirin EC 81 MG tablet Replaced by:  aspirin 300 MG suppository    atorvastatin 10 MG tablet Commonly known as:  LIPITOR   citalopram 10 MG tablet Commonly known as:  CELEXA   cloNIDine 0.1 MG tablet Commonly known as:  CATAPRES Replaced by:  cloNIDine 0.3 mg/24hr patch   docusate 50 MG/5ML liquid Commonly known as:  COLACE   gabapentin 300 MG capsule Commonly known as:  NEURONTIN   hydrALAZINE 25 MG tablet Commonly known as:  APRESOLINE Replaced by:  hydrALAZINE 20 MG/ML injection   isosorbide mononitrate 60 MG 24 hr tablet Commonly known as:  IMDUR   labetalol 200 MG tablet Commonly known as:  NORMODYNE Replaced by:  labetalol 5 MG/ML injection   NON-ASPIRIN PAIN RELIEF 325 MG tablet Generic drug:  acetaminophen Replaced by:  acetaminophen 160 MG/5ML solution   promethazine 12.5 MG tablet Commonly known as:  PHENERGAN   risperiDONE 0.5 MG tablet Commonly known as:  RISPERDAL     TAKE these medications   acetaminophen 160 MG/5ML solution Commonly known as:  TYLENOL Take 20.3 mLs (650 mg total) by mouth every 6 (six) hours as needed for mild pain or fever. Replaces:  NON-ASPIRIN PAIN RELIEF 325 MG tablet   aspirin 300 MG suppository Place  1 suppository (300 mg total) rectally daily. Replaces:  aspirin EC 81 MG tablet   B-D INS SYRINGE 0.5CC/31GX5/16 31G X 5/16" 0.5 ML Misc Generic drug:  Insulin Syringe-Needle U-100 USE AS DIRECTED FOR INSULIN ADMINISTRATION.   BD PEN NEEDLE NANO U/F 32G X 4 MM Misc Generic drug:  Insulin Pen Needle USE WITH LANTUS DAILY.   cefTRIAXone 2 g in dextrose 5 % 50 mL Inject 2 g into the vein daily. through 03/05/16.   cloNIDine 0.3 mg/24hr patch Commonly known as:  CATAPRES - Dosed in mg/24 hr Place 1 patch (0.3 mg total) onto the skin once a week. Start taking on:  02/28/2016 Replaces:  cloNIDine 0.1 MG tablet   donepezil 10 MG tablet Commonly known as:  ARICEPT TAKE ONE TABLET BY MOUTH EVERY DAY   feeding supplement (GLUCERNA SHAKE) Liqd Take 237 mLs by mouth daily.   glucose blood  test strip Commonly known as:  BAYER CONTOUR NEXT TEST Use as instructed   hydrALAZINE 20 MG/ML injection Commonly known as:  APRESOLINE Inject 0.5 mLs (10 mg total) into the vein every 6 (six) hours. Replaces:  hydrALAZINE 25 MG tablet   labetalol 5 MG/ML injection Commonly known as:  NORMODYNE,TRANDATE Inject 2 mLs (10 mg total) into the vein every 4 (four) hours as needed (sbp over 180). Replaces:  labetalol 200 MG tablet   Lancets Misc Use as directed 1 per day   LANTUS 100 UNIT/ML injection Generic drug:  insulin glargine INJECT 16 UNITS SUBCUTANEOUSLY EVERY DAY AT BEDTIME.   nystatin powder Commonly known as:  MYCOSTATIN/NYSTOP Apply 1 g topically 4 (four) times daily.      No Known Allergies    The results of significant diagnostics from this hospitalization (including imaging, microbiology, ancillary and laboratory) are listed below for reference.    Significant Diagnostic Studies: Ct Head Wo Contrast  Result Date: 02/18/2016 CLINICAL DATA:  Lethargy.  Metastatic disease. EXAM: CT HEAD WITHOUT CONTRAST TECHNIQUE: Contiguous axial images were obtained from the base of the skull through the vertex without intravenous contrast. COMPARISON:  CT head 02/04/2016 FINDINGS: Brain: Moderate atrophy. Low density throughout the cerebral white matter is unchanged and appears chronic. Negative for mass lesion. No shift of the midline structures. Negative for hemorrhage. No acute infarct. Vascular: No hyperdense vessel or unexpected calcification. Skull: Normal. Negative for fracture or focal lesion. Sinuses/Orbits: No acute finding. Other: None. IMPRESSION: Atrophy and chronic white matter changes are stable. No superimposed acute abnormality. Electronically Signed   By: Franchot Gallo M.D.   On: 02/18/2016 17:44   Ct Head Wo Contrast  Result Date: 02/04/2016 CLINICAL DATA:  Lethargy.  History of dementia. EXAM: CT HEAD WITHOUT CONTRAST TECHNIQUE: Contiguous axial images were  obtained from the base of the skull through the vertex without intravenous contrast. COMPARISON:  September 13, 2015 FINDINGS: Brain: No subdural, epidural, or subarachnoid hemorrhage. Prominence of the ventricles and sulci is stable. The cerebellum, brainstem, and basal cisterns are normal. Moderate two severe white matter changes again identified. No acute cortical ischemia or infarct. No mass, mass effect, or midline shift. Vascular: Atherosclerotic changes seen in the intracranial portions of the carotid arteries. Skull: Normal Sinuses/Orbits: No acute finding. Other: No other abnormalities. IMPRESSION: Moderate to severe white matter changes. No acute abnormalities identified. Electronically Signed   By: Dorise Bullion III M.D   On: 02/04/2016 20:41   Ct Chest Wo Contrast  Result Date: 02/05/2016 CLINICAL DATA:  Left upper lobe enlarging lung mass. EXAM: CT CHEST  WITHOUT CONTRAST TECHNIQUE: Multidetector CT imaging of the chest was performed following the standard protocol without IV contrast. COMPARISON:  02/04/2016 and PET-CT 11/09/2013 FINDINGS: Cardiovascular: Aortoiliac atherosclerotic vascular disease. Mediastinum/Nodes: AP window lymph node 3.4 cm in short axis, image 44/3, new compared to prior PET-CT. Left hilar and infrahilar adenopathy is suspected as well. Lungs/Pleura: Left upper lobe mass 3.7 by 2.8 cm on image 20/7, formerly 1.7 by 1.4 cm on 11/09/2013. New satellite nodules observed. Other bilateral pulmonary nodules are present including a 3 point 1 by 2.0 cm mass which previously measured 1.3 by 1.2 cm. A new nodule posteriorly in the left upper lobe measures 1.1 by 0.7 cm. Multiple scattered additional pulmonary nodules are present. Trace bilateral pleural effusions. Upper Abdomen: Right nephrectomy. Left adrenal mass, 3.2 by 3.6 cm, internal density 34 Hounsfield units, previously 3.4 by 3.2 cm. Vascular calcification of the left renal hilum. Multiple small hyperdense lesions of the left  kidney, probably complex cysts but technically nonspecific. Musculoskeletal: Subcutaneous mass along the right upper chest anterior to the pectoralis muscle, 2.1 by 1.2 cm on image 24/3, not present on the prior PET-CT. Lucency in the right proximal humerus possibly related to prior fracture. Thoracic spondylosis. Old left lateral rib fractures. IMPRESSION: 1. New and enlarging pulmonary nodules/pulmonary masses, compatible with progressive metastatic disease. 2. Stable appearance of the left adrenal mass, possibly an adenoma but technically nonspecific based on density. 3. Pathologic mediastinal and hilar adenopathy including a 3.4 cm in short axis AP window lymph node which was not present on prior PET-CT of 11/09/2013. 4. Subcutaneous mass likely a metastatic lesion in the right upper chest anterior to the pectoralis muscle. 5. Suspected small complex cysts of the left kidney. Electronically Signed   By: Van Clines M.D.   On: 02/05/2016 19:43   US Biopsy  Addendum Date: 02/20/2016   ADDENDUM REPORT: 02/20/2016 10:19 ADDENDUM: EXAM section should read as follows: EXAM: EXAM ULTRASOUND BIOPSY Electronically Signed   By: Jacqulynn Cadet M.D.   On: 02/20/2016 10:19   Result Date: 02/20/2016 INDICATION: 82 year old female with newly diagnosed widespread metastatic disease. Patient has a history of prior renal cell carcinoma in the remote past. Biopsy of superficial subcutaneous nodule of the right chest wall to evaluate tissue diagnosis. EXAM: ULTRASOUND BIOPSY CORE LIVER MEDICATIONS: None. ANESTHESIA/SEDATION: None FLUOROSCOPY TIME:  Fluoroscopy Time: 0 minutes 0 seconds (0 mGy). COMPLICATIONS: None immediate. PROCEDURE: Informed written consent was obtained from the patient after a thorough discussion of the procedural risks, benefits and alternatives. All questions were addressed. A timeout was performed prior to the initiation of the procedure. Local anesthesia was attained by infiltration with 1%  lidocaine. Under real-time sonographic guidance, multiple 18 gauge core biopsies were obtained using the bio Pince automated biopsy device. Needle placement within the subcutaneous soft tissue nodule was confirmed on all biopsy passes. Biopsy specimens were placed in formalin and delivered to pathology for further analysis. Hemostasis was attained by gentle manual pressure. There was no immediate complication. IMPRESSION: Technically successful ultrasound-guided core biopsy of superficial subcutaneous nodule in the anterior right chest wall. Electronically Signed: By: Jacqulynn Cadet M.D. On: 02/08/2016 16:14   Dg Chest Portable 1 View  Result Date: 02/18/2016 CLINICAL DATA:  Altered mental status. History of previous renal cell carcinoma. EXAM: PORTABLE CHEST 1 VIEW COMPARISON:  Chest CT April 06, 2016 FINDINGS: The previously noted mass in the left upper lobe near the apex is visualize, currently measuring 4.0 x 3.4 cm in size. There is  no edema or consolidation. There is bilateral hilar adenopathy, more pronounced on the left than on the right, stable. There are S scattered nodular opacities in both lower lobes, better appreciated on recent CT than on the current radiographic examination. No new opacity is evident. There is atherosclerotic calcification in the aortic arch region. There are postoperative clips in the midline cervical-thoracic region. No bone lesions are evident. IMPRESSION: Evidence of metastatic disease with lung masses and adenopathy, better characterized on recent chest CT. No new opacity evident. No airspace consolidations suggestive of acute pneumonia. There is aortic atherosclerosis. Electronically Signed   By: Lowella Grip III M.D.   On: 02/18/2016 14:15   Dg Chest Port 1 View  Result Date: 02/04/2016 CLINICAL DATA:  Acute onset of altered mental status. Failure to thrive. Initial encounter. EXAM: PORTABLE CHEST 1 VIEW COMPARISON:  Chest radiograph from 09/13/2015 FINDINGS:  The left apical mass has increased in size. There is also increased prominence of the left hilum. Findings are suspicious for worsening bronchogenic malignancy and metastatic disease. Known additional bilateral pulmonary nodules are not well seen. No pleural effusion or pneumothorax is seen. The cardiomediastinal silhouette is otherwise unremarkable in appearance. No displaced rib fractures are seen. IMPRESSION: **An incidental finding of potential clinical significance has been found. Left apical mass has increased in size, measuring 4.5 cm. Increased prominence of the left hilum. Findings suspicious for worsening metastatic disease. PET/CT would be helpful for further evaluation, as deemed clinically appropriate.** Electronically Signed   By: Garald Balding M.D.   On: 02/04/2016 23:26    Microbiology: Recent Results (from the past 240 hour(s))  Urine culture     Status: Abnormal   Collection Time: 02/18/16  3:35 PM  Result Value Ref Range Status   Specimen Description URINE, CLEAN CATCH  Final   Special Requests NONE  Final   Culture >=100,000 COLONIES/mL ESCHERICHIA COLI (A)  Final   Report Status 02/20/2016 FINAL  Final   Organism ID, Bacteria ESCHERICHIA COLI (A)  Final      Susceptibility   Escherichia coli - MIC*    AMPICILLIN <=2 SENSITIVE Sensitive     CEFAZOLIN <=4 SENSITIVE Sensitive     CEFTRIAXONE <=1 SENSITIVE Sensitive     CIPROFLOXACIN <=0.25 SENSITIVE Sensitive     GENTAMICIN <=1 SENSITIVE Sensitive     IMIPENEM <=0.25 SENSITIVE Sensitive     NITROFURANTOIN <=16 SENSITIVE Sensitive     TRIMETH/SULFA <=20 SENSITIVE Sensitive     AMPICILLIN/SULBACTAM <=2 SENSITIVE Sensitive     PIP/TAZO <=4 SENSITIVE Sensitive     Extended ESBL NEGATIVE Sensitive     * >=100,000 COLONIES/mL ESCHERICHIA COLI  Blood culture (routine x 2)     Status: Abnormal   Collection Time: 02/18/16  4:20 PM  Result Value Ref Range Status   Specimen Description BLOOD RIGHT ANTECUBITAL  Final   Special  Requests BOTTLES DRAWN AEROBIC ONLY 5CC  Final   Culture  Setup Time   Final    GRAM NEGATIVE RODS AEROBIC BOTTLE ONLY CRITICAL RESULT CALLED TO, READ BACK BY AND VERIFIED WITH: C. SUMME, PHARMD ([place]) AT 1930 ON 02/19/16 BY C. JESSUP, MLT. Performed at [place] (A)  Final   Report Status 02/21/2016 FINAL  Final   Organism ID, Bacteria ESCHERICHIA COLI  Final      Susceptibility   Escherichia coli - MIC*    AMPICILLIN <=2 SENSITIVE Sensitive     CEFAZOLIN <=4 SENSITIVE Sensitive  CEFEPIME <=1 SENSITIVE Sensitive     CEFTAZIDIME <=1 SENSITIVE Sensitive     CEFTRIAXONE <=1 SENSITIVE Sensitive     CIPROFLOXACIN <=0.25 SENSITIVE Sensitive     GENTAMICIN <=1 SENSITIVE Sensitive     IMIPENEM <=0.25 SENSITIVE Sensitive     TRIMETH/SULFA <=20 SENSITIVE Sensitive     AMPICILLIN/SULBACTAM <=2 SENSITIVE Sensitive     PIP/TAZO <=4 SENSITIVE Sensitive     Extended ESBL NEGATIVE Sensitive     * ESCHERICHIA COLI  Blood Culture ID Panel (Reflexed)     Status: Abnormal   Collection Time: 02/18/16  4:20 PM  Result Value Ref Range Status   Enterococcus species NOT DETECTED NOT DETECTED Final   Listeria monocytogenes NOT DETECTED NOT DETECTED Final   Staphylococcus species NOT DETECTED NOT DETECTED Final   Staphylococcus aureus NOT DETECTED NOT DETECTED Final   Streptococcus species NOT DETECTED NOT DETECTED Final   Streptococcus agalactiae NOT DETECTED NOT DETECTED Final   Streptococcus pneumoniae NOT DETECTED NOT DETECTED Final   Streptococcus pyogenes NOT DETECTED NOT DETECTED Final   Acinetobacter baumannii NOT DETECTED NOT DETECTED Final   Enterobacteriaceae species DETECTED (A) NOT DETECTED Final    Comment: CRITICAL RESULT CALLED TO, READ BACK BY AND VERIFIED WITH: C. SUMME, PHARMD ([place]) AT 1930 ON 02/19/16 BY C. JESSUP, MLT.    Enterobacter cloacae complex NOT DETECTED NOT DETECTED Final   Escherichia coli DETECTED (A) NOT DETECTED Final    Comment:  CRITICAL RESULT CALLED TO, READ BACK BY AND VERIFIED WITH: C. SUMME, PHARMD ([place]) AT 1930 ON 02/19/16 BY C. JESSUP, MLT.    Klebsiella oxytoca NOT DETECTED NOT DETECTED Final   Klebsiella pneumoniae NOT DETECTED NOT DETECTED Final   Proteus species NOT DETECTED NOT DETECTED Final   Serratia marcescens NOT DETECTED NOT DETECTED Final   Carbapenem resistance NOT DETECTED NOT DETECTED Final   Haemophilus influenzae NOT DETECTED NOT DETECTED Final   Neisseria meningitidis NOT DETECTED NOT DETECTED Final   Pseudomonas aeruginosa NOT DETECTED NOT DETECTED Final   Candida albicans NOT DETECTED NOT DETECTED Final   Candida glabrata NOT DETECTED NOT DETECTED Final   Candida krusei NOT DETECTED NOT DETECTED Final   Candida parapsilosis NOT DETECTED NOT DETECTED Final   Candida tropicalis NOT DETECTED NOT DETECTED Final    Comment: Performed at [facility]  Blood culture (routine x 2)     Status: Abnormal   Collection Time: 02/18/16  5:00 PM  Result Value Ref Range Status   Specimen Description BLOOD LEFT ANTECUBITAL  Final   Special Requests IN PEDIATRIC BOTTLE 2 CC  Final   Culture  Setup Time   Final    GRAM POSITIVE COCCI IN CLUSTERS IN PEDIATRIC BOTTLE CRITICAL RESULT CALLED TO, READ BACK BY AND VERIFIED WITH: T. Pickering Pharm.D. 9:55 02/20/16 ([email or web address])    Culture (A)  Final    STAPHYLOCOCCUS SPECIES (COAGULASE NEGATIVE) THE SIGNIFICANCE OF ISOLATING THIS ORGANISM FROM A SINGLE SET OF BLOOD CULTURES WHEN MULTIPLE SETS ARE DRAWN IS UNCERTAIN. PLEASE NOTIFY THE MICROBIOLOGY DEPARTMENT WITHIN ONE WEEK IF SPECIATION AND SENSITIVITIES ARE REQUIRED. Performed at [facility]    Report Status 02/21/2016 FINAL  Final  Blood Culture ID Panel (Reflexed)     Status: Abnormal   Collection Time: 02/18/16  5:00 PM  Result Value Ref Range Status   Enterococcus species NOT DETECTED NOT DETECTED Final   Listeria monocytogenes NOT DETECTED NOT DETECTED Final   Staphylococcus species  DETECTED (A) NOT DETECTED Final  Comment: CRITICAL RESULT CALLED TO, READ BACK BY AND VERIFIED WITH: T. Pickering Pharm.D. 9:55 02/20/16 ([email or web address])    Staphylococcus aureus NOT DETECTED NOT DETECTED Final   Methicillin resistance NOT DETECTED NOT DETECTED Final   Streptococcus species NOT DETECTED NOT DETECTED Final   Streptococcus agalactiae NOT DETECTED NOT DETECTED Final   Streptococcus pneumoniae NOT DETECTED NOT DETECTED Final   Streptococcus pyogenes NOT DETECTED NOT DETECTED Final   Acinetobacter baumannii NOT DETECTED NOT DETECTED Final   Enterobacteriaceae species NOT DETECTED NOT DETECTED Final   Enterobacter cloacae complex NOT DETECTED NOT DETECTED Final   Escherichia coli NOT DETECTED NOT DETECTED Final   Klebsiella oxytoca NOT DETECTED NOT DETECTED Final   Klebsiella pneumoniae NOT DETECTED NOT DETECTED Final   Proteus species NOT DETECTED NOT DETECTED Final   Serratia marcescens NOT DETECTED NOT DETECTED Final   Haemophilus influenzae NOT DETECTED NOT DETECTED Final   Neisseria meningitidis NOT DETECTED NOT DETECTED Final   Pseudomonas aeruginosa NOT DETECTED NOT DETECTED Final   Candida albicans NOT DETECTED NOT DETECTED Final   Candida glabrata NOT DETECTED NOT DETECTED Final   Candida krusei NOT DETECTED NOT DETECTED Final   Candida parapsilosis NOT DETECTED NOT DETECTED Final   Candida tropicalis NOT DETECTED NOT DETECTED Final    Comment: Performed at [facility]  Culture, blood (Routine X 2) w Reflex to ID Panel     Status: None (Preliminary result)   Collection Time: 02/20/16 11:47 AM  Result Value Ref Range Status   Specimen Description BLOOD RIGHT ARM  Final   Special Requests BOTTLES DRAWN AEROBIC AND ANAEROBIC 10CC  Final   Culture   Final    NO GROWTH 4 DAYS Performed at [facility]    Report Status PENDING  Incomplete  Culture, blood (Routine X 2) w Reflex to ID Panel     Status: None (Preliminary result)   Collection Time: 02/20/16  11:48 AM  Result Value Ref Range Status   Specimen Description BLOOD RIGHT HAND  Final   Special Requests IN PEDIATRIC BOTTLE 1CC  Final   Culture   Final    NO GROWTH 4 DAYS Performed at [facility]    Report Status PENDING  Incomplete     Labs: Basic Metabolic Panel:  Recent Labs Lab 02/19/16 0448 02/21/16 0620 02/22/16 0514 02/23/16 1035 02/24/16 0605  NA 146* 145 144 146* 148*  K 3.6 2.9* 3.1* 3.5 3.1*  CL 117* 114* 111 112* 112*  CO2 _0 GLUCOSE 118* 142* 169* 119* 210*  BUN 44* _1 CREATININE 1.67* 0.87 0.87 0.87 0.91  CALCIUM 8.6* 8.7* 8.6* 9.1 9.3  MG 1.5*  --  1.8  --   --   PHOS 3.4  --   --   --   --    Liver Function Tests:  Recent Labs Lab 02/18/16 1416 02/19/16 0448  AST 14* 13*  ALT 22 18  ALKPHOS 87 86  BILITOT 0.6 0.3  PROT 6.7 5.5*  ALBUMIN 3.0* 2.3*   No results for input(s): LIPASE, AMYLASE in the last 168 hours. No results for input(s): AMMONIA in the last 168 hours. CBC:  Recent Labs Lab 02/18/16 1416 02/19/16 0448 02/21/16 0620 02/22/16 0514 02/24/16 0605  WBC 25.9* 33.2* 17.1* 15.4* 27.3*  NEUTROABS 23.9*  --   --   --   --   HGB 9.9* 8.5* 9.9* 10.2* 10.3*  HCT 31.3* 26.2* 31.7* 31.8* 32.2*  MCV 89.4 86.8 87.8 85.7 85.9  PLT 365 305 295 278 341   Cardiac Enzymes:  Recent Labs Lab 02/18/16 1416  TROPONINI <0.03   BNP: BNP (last 3 results) No results for input(s): BNP in the last 8760 hours.  ProBNP (last 3 results) No results for input(s): PROBNP in the last 8760 hours.  CBG:  Recent Labs Lab 02/23/16 1143 02/23/16 1847 02/23/16 2144 02/24/16 0723 02/24/16 1148  GLUCAP 118* 165* 160* 205* 168*    Time spent: 35 minutes were spent in preparing this discharge including medication reconciliation, counseling, and coordination of care.  Signed:  [employer]  [facility] 02/24/2016, 1:58 PM

## 2016-02-24 NOTE — Progress Notes (Signed)
[facility] PROGRESS NOTE    Progress Note  Katie Dawson  KCL:[id number] DOB: 09-05-1933 DOA: 02/18/2016 PCP: Cathlean Cower, MD   Brief Narrative:   Katie Dawson is an 82 y.o. female past medical history significant for metastatic renal cell carcinoma with advanced dementia and recurrent UTIs comes in for lethargy. She is being treated for UTI with bacteremia and has been refusing oral medications. PICC line was placed 9/23 in anticipation of SNF discharge on IV medications as family wants to keep patient full code and under full treatment.  Assessment/Plan:   Acute kidney injury: In the setting of a UTI and probably mild dehydration. Likely prerenal in etiology she was started on IV fluid hydration her creatinine has improved back to baseline, will recheck renal function intermittently.  Encephalopathy superimposed on advanced dementia in the setting of a UTI with ecoli bacteremia: She has baseline dementia with likely hospital delirium and infectious encephalopathy worsening the picture. She currently is refusing to take medications and I feel this is indicative of end stage dementia, but daughter is adamant that we need to continue treating her. As such, placed PICC line and SW is working on SNF placement. Palliative care has been involved and unable to make much headway with family.  UTI with e coli bacteremia: IV Rocephin Trend CBC and repeat blood cultures are negative. She'll need 14 days of antibiotics for her bacteremia.  Hypotension/essential hypertension: Currently resolved with holding antihypertensive medication, but now hypertensive as she's been refusing PO medications the last few days. Increase clonidine patch, hydralazine scheduled. Continue PRN IV labetalol BP is overall improved now, and acceptable.  History of metastatic renal cell carcinoma with lung metastases: She is currently under hospice care but is a FULL CODE as per daughter she would never want  to her mother to be a DNR. Family does not grasp the progression and prognosis of her renal cell carcinoma, palliative Care met with family and there has been no change. Last oncology notes indicated family is aware of her diagnosis and understands no further treatment is available.   Diabetes mellitus type 2: Continue holding glipizide continue sliding scale insulin. Blood glucose is well controlled.  Hypokalemia/hypomagnesium -repleted, recheck today  DVT prophylaxis: lovenox Family Communication: daughter updated by phone 9/22  Disposition Plan/Barrier to D/C: SNF with hospice. Family has been made aware of bed offers and patient is medically ready for discharge.  Code Status:     Code Status Orders        Start     Ordered   02/18/16 2322  Full code  Continuous     02/18/16 2321    Code Status History    Date Active Date Inactive Code Status Order ID Comments User Context   02/05/2016 12:28 AM 02/09/2016 10:21 PM Full Code [id number]  [name], MD Inpatient   09/13/2015  3:45 PM 09/17/2015  8:36 PM Full Code [id number]  [name], PA-C ED   04/05/2014  6:19 PM 04/07/2014  3:46 PM Full Code [id number]  [name], MD Inpatient   10/16/2013  4:35 PM 10/20/2013  7:17 PM Full Code [id number]  [name], MD Inpatient   05/16/2013 12:05 AM 05/18/2013  8:59 PM Full Code [id number]  [name], MD Inpatient        IV Access:    Peripheral IV   Procedures and diagnostic studies:   No results found.   Medical Consultants:    Palliative care  Anti-Infectives:  Rocephin  Subjective:    Resting, no acute events and no complaints. She is confused, not taking anything PO now.  Objective:    Vitals:   02/23/16 1351 02/23/16 2049 02/23/16 2330 02/24/16 0550  BP: (!) 168/74 (!) 179/71  (!) 168/89  Pulse: (!) 104 (!) 109  95  Resp: '20 20  20  ' Temp: 99 F (37.2 C) (!) 101 F (38.3 C) 100 F (37.8 C) 99.6 F (37.6 C)  TempSrc: Axillary  Axillary Axillary Axillary  SpO2: 100% 98%  98%  Weight:      Height:        Intake/Output Summary (Last 24 hours) at 02/24/16 1318 Last data filed at 02/24/16 0605  Gross per 24 hour  Intake              510 ml  Output              500 ml  Net               10 ml   Filed Weights   02/18/16 1250  Weight: 85.3 kg (188 lb)    Exam: General exam: In no acute distress, alert, not oriented Respiratory system: Good air movement and clear to auscultation. Cardiovascular system: S1 & S2 heard, RRR. No JVD, murmurs, rubs, gallops or clicks.  Gastrointestinal system: Abdomen is nondistended, soft and nontender.  Extremities: No pedal edema. Skin: No rashes, lesions or ulcers  Data Reviewed:    Labs: Basic Metabolic Panel:  Recent Labs Lab 02/19/16 0448 02/21/16 0620 02/22/16 0514 02/23/16 1035 02/24/16 0605  NA 146* 145 144 146* 148*  K 3.6 2.9* 3.1* 3.5 3.1*  CL 117* 114* 111 112* 112*  CO2 '22 25 26 26 26  ' GLUCOSE 118* 142* 169* 119* 210*  BUN 44* '15 13 15 18  ' CREATININE 1.67* 0.87 0.87 0.87 0.91  CALCIUM 8.6* 8.7* 8.6* 9.1 9.3  MG 1.5*  --  1.8  --   --   PHOS 3.4  --   --   --   --    GFR Estimated Creatinine Clearance: 54 mL/min (by C-G formula based on SCr of 0.91 mg/dL). Liver Function Tests:  Recent Labs Lab 02/18/16 1416 02/19/16 0448  AST 14* 13*  ALT 22 18  ALKPHOS 87 86  BILITOT 0.6 0.3  PROT 6.7 5.5*  ALBUMIN 3.0* 2.3*   No results for input(s): LIPASE, AMYLASE in the last 168 hours. No results for input(s): AMMONIA in the last 168 hours. Coagulation profile No results for input(s): INR, PROTIME in the last 168 hours.  CBC:  Recent Labs Lab 02/18/16 1416 02/19/16 0448 02/21/16 0620 02/22/16 0514 02/24/16 0605  WBC 25.9* 33.2* 17.1* 15.4* 27.3*  NEUTROABS 23.9*  --   --   --   --   HGB 9.9* 8.5* 9.9* 10.2* 10.3*  HCT 31.3* 26.2* 31.7* 31.8* 32.2*  MCV 89.4 86.8 87.8 85.7 85.9  PLT 365 305 295 278 341   Cardiac Enzymes:  Recent  Labs Lab 02/18/16 1416  TROPONINI <0.03   BNP (last 3 results) No results for input(s): PROBNP in the last 8760 hours. CBG:  Recent Labs Lab 02/23/16 1143 02/23/16 1847 02/23/16 2144 02/24/16 0723 02/24/16 1148  GLUCAP 118* 165* 160* 205* 168*   D-Dimer: No results for input(s): DDIMER in the last 72 hours. Hgb A1c: No results for input(s): HGBA1C in the last 72 hours. Lipid Profile: No results for input(s): CHOL, HDL, LDLCALC, TRIG, CHOLHDL, LDLDIRECT in  the last 72 hours. Thyroid function studies: No results for input(s): TSH, T4TOTAL, T3FREE, THYROIDAB in the last 72 hours.  Invalid input(s): FREET3 Anemia work up: No results for input(s): VITAMINB12, FOLATE, FERRITIN, TIBC, IRON, RETICCTPCT in the last 72 hours. Sepsis Labs:  Recent Labs Lab 02/18/16 1426 02/18/16 1630 02/19/16 0448 02/21/16 0620 02/22/16 0514 02/24/16 0605  WBC  --   --  33.2* 17.1* 15.4* 27.3*  LATICACIDVEN 0.90 1.30  --   --   --   --    Microbiology Recent Results (from the past 240 hour(s))  Urine culture     Status: Abnormal   Collection Time: 02/18/16  3:35 PM  Result Value Ref Range Status   Specimen Description URINE, CLEAN CATCH  Final   Special Requests NONE  Final   Culture >=100,000 COLONIES/mL ESCHERICHIA COLI (A)  Final   Report Status 02/20/2016 FINAL  Final   Organism ID, Bacteria ESCHERICHIA COLI (A)  Final      Susceptibility   Escherichia coli - MIC*    AMPICILLIN <=2 SENSITIVE Sensitive     CEFAZOLIN <=4 SENSITIVE Sensitive     CEFTRIAXONE <=1 SENSITIVE Sensitive     CIPROFLOXACIN <=0.25 SENSITIVE Sensitive     GENTAMICIN <=1 SENSITIVE Sensitive     IMIPENEM <=0.25 SENSITIVE Sensitive     NITROFURANTOIN <=16 SENSITIVE Sensitive     TRIMETH/SULFA <=20 SENSITIVE Sensitive     AMPICILLIN/SULBACTAM <=2 SENSITIVE Sensitive     PIP/TAZO <=4 SENSITIVE Sensitive     Extended ESBL NEGATIVE Sensitive     * >=100,000 COLONIES/mL ESCHERICHIA COLI  Blood culture (routine  x 2)     Status: Abnormal   Collection Time: 02/18/16  4:20 PM  Result Value Ref Range Status   Specimen Description BLOOD RIGHT ANTECUBITAL  Final   Special Requests BOTTLES DRAWN AEROBIC ONLY 5CC  Final   Culture  Setup Time   Final    GRAM NEGATIVE RODS AEROBIC BOTTLE ONLY CRITICAL RESULT CALLED TO, READ BACK BY AND VERIFIED WITH: C. SUMME, PHARMD ([place]) AT 1930 ON 02/19/16 BY C. JESSUP, MLT. Performed at [place] (A)  Final   Report Status 02/21/2016 FINAL  Final   Organism ID, Bacteria ESCHERICHIA COLI  Final      Susceptibility   Escherichia coli - MIC*    AMPICILLIN <=2 SENSITIVE Sensitive     CEFAZOLIN <=4 SENSITIVE Sensitive     CEFEPIME <=1 SENSITIVE Sensitive     CEFTAZIDIME <=1 SENSITIVE Sensitive     CEFTRIAXONE <=1 SENSITIVE Sensitive     CIPROFLOXACIN <=0.25 SENSITIVE Sensitive     GENTAMICIN <=1 SENSITIVE Sensitive     IMIPENEM <=0.25 SENSITIVE Sensitive     TRIMETH/SULFA <=20 SENSITIVE Sensitive     AMPICILLIN/SULBACTAM <=2 SENSITIVE Sensitive     PIP/TAZO <=4 SENSITIVE Sensitive     Extended ESBL NEGATIVE Sensitive     * ESCHERICHIA COLI  Blood Culture ID Panel (Reflexed)     Status: Abnormal   Collection Time: 02/18/16  4:20 PM  Result Value Ref Range Status   Enterococcus species NOT DETECTED NOT DETECTED Final   Listeria monocytogenes NOT DETECTED NOT DETECTED Final   Staphylococcus species NOT DETECTED NOT DETECTED Final   Staphylococcus aureus NOT DETECTED NOT DETECTED Final   Streptococcus species NOT DETECTED NOT DETECTED Final   Streptococcus agalactiae NOT DETECTED NOT DETECTED Final   Streptococcus pneumoniae NOT DETECTED NOT DETECTED Final   Streptococcus pyogenes NOT DETECTED  NOT DETECTED Final   Acinetobacter baumannii NOT DETECTED NOT DETECTED Final   Enterobacteriaceae species DETECTED (A) NOT DETECTED Final    Comment: CRITICAL RESULT CALLED TO, READ BACK BY AND VERIFIED WITH: C. SUMME, PHARMD ([place]) AT  1930 ON 02/19/16 BY C. JESSUP, MLT.    Enterobacter cloacae complex NOT DETECTED NOT DETECTED Final   Escherichia coli DETECTED (A) NOT DETECTED Final    Comment: CRITICAL RESULT CALLED TO, READ BACK BY AND VERIFIED WITH: C. SUMME, PHARMD ([place]) AT 1930 ON 02/19/16 BY C. JESSUP, MLT.    Klebsiella oxytoca NOT DETECTED NOT DETECTED Final   Klebsiella pneumoniae NOT DETECTED NOT DETECTED Final   Proteus species NOT DETECTED NOT DETECTED Final   Serratia marcescens NOT DETECTED NOT DETECTED Final   Carbapenem resistance NOT DETECTED NOT DETECTED Final   Haemophilus influenzae NOT DETECTED NOT DETECTED Final   Neisseria meningitidis NOT DETECTED NOT DETECTED Final   Pseudomonas aeruginosa NOT DETECTED NOT DETECTED Final   Candida albicans NOT DETECTED NOT DETECTED Final   Candida glabrata NOT DETECTED NOT DETECTED Final   Candida krusei NOT DETECTED NOT DETECTED Final   Candida parapsilosis NOT DETECTED NOT DETECTED Final   Candida tropicalis NOT DETECTED NOT DETECTED Final    Comment: Performed at [facility]  Blood culture (routine x 2)     Status: Abnormal   Collection Time: 02/18/16  5:00 PM  Result Value Ref Range Status   Specimen Description BLOOD LEFT ANTECUBITAL  Final   Special Requests IN PEDIATRIC BOTTLE 2 CC  Final   Culture  Setup Time   Final    GRAM POSITIVE COCCI IN CLUSTERS IN PEDIATRIC BOTTLE CRITICAL RESULT CALLED TO, READ BACK BY AND VERIFIED WITH: T. Pickering Pharm.D. 9:55 02/20/16 ([email or web address])    Culture (A)  Final    STAPHYLOCOCCUS SPECIES (COAGULASE NEGATIVE) THE SIGNIFICANCE OF ISOLATING THIS ORGANISM FROM A SINGLE SET OF BLOOD CULTURES WHEN MULTIPLE SETS ARE DRAWN IS UNCERTAIN. PLEASE NOTIFY THE MICROBIOLOGY DEPARTMENT WITHIN ONE WEEK IF SPECIATION AND SENSITIVITIES ARE REQUIRED. Performed at [facility]    Report Status 02/21/2016 FINAL  Final  Blood Culture ID Panel (Reflexed)     Status: Abnormal   Collection Time: 02/18/16  5:00 PM  Result  Value Ref Range Status   Enterococcus species NOT DETECTED NOT DETECTED Final   Listeria monocytogenes NOT DETECTED NOT DETECTED Final   Staphylococcus species DETECTED (A) NOT DETECTED Final    Comment: CRITICAL RESULT CALLED TO, READ BACK BY AND VERIFIED WITH: T. Pickering Pharm.D. 9:55 02/20/16 ([email or web address])    Staphylococcus aureus NOT DETECTED NOT DETECTED Final   Methicillin resistance NOT DETECTED NOT DETECTED Final   Streptococcus species NOT DETECTED NOT DETECTED Final   Streptococcus agalactiae NOT DETECTED NOT DETECTED Final   Streptococcus pneumoniae NOT DETECTED NOT DETECTED Final   Streptococcus pyogenes NOT DETECTED NOT DETECTED Final   Acinetobacter baumannii NOT DETECTED NOT DETECTED Final   Enterobacteriaceae species NOT DETECTED NOT DETECTED Final   Enterobacter cloacae complex NOT DETECTED NOT DETECTED Final   Escherichia coli NOT DETECTED NOT DETECTED Final   Klebsiella oxytoca NOT DETECTED NOT DETECTED Final   Klebsiella pneumoniae NOT DETECTED NOT DETECTED Final   Proteus species NOT DETECTED NOT DETECTED Final   Serratia marcescens NOT DETECTED NOT DETECTED Final   Haemophilus influenzae NOT DETECTED NOT DETECTED Final   Neisseria meningitidis NOT DETECTED NOT DETECTED Final   Pseudomonas aeruginosa NOT DETECTED NOT DETECTED Final   Candida albicans NOT DETECTED  NOT DETECTED Final   Candida glabrata NOT DETECTED NOT DETECTED Final   Candida krusei NOT DETECTED NOT DETECTED Final   Candida parapsilosis NOT DETECTED NOT DETECTED Final   Candida tropicalis NOT DETECTED NOT DETECTED Final    Comment: Performed at [facility]  Culture, blood (Routine X 2) w Reflex to ID Panel     Status: None (Preliminary result)   Collection Time: 02/20/16 11:47 AM  Result Value Ref Range Status   Specimen Description BLOOD RIGHT ARM  Final   Special Requests BOTTLES DRAWN AEROBIC AND ANAEROBIC 10CC  Final   Culture   Final    NO GROWTH 4 DAYS Performed at [facility]    Report Status PENDING  Incomplete  Culture, blood (Routine X 2) w Reflex to ID Panel     Status: None (Preliminary result)   Collection Time: 02/20/16 11:48 AM  Result Value Ref Range Status   Specimen Description BLOOD RIGHT HAND  Final   Special Requests IN PEDIATRIC BOTTLE [place]  Final   Culture   Final    NO GROWTH 4 DAYS Performed at [facility]    Report Status PENDING  Incomplete     Medications:   . aspirin  300 mg Rectal Daily  . cefTRIAXone (ROCEPHIN)  IV  2 g Intravenous Q24H  . [START ON 02/28/2016] cloNIDine  0.3 mg Transdermal Weekly  . enoxaparin (LOVENOX) injection  40 mg Subcutaneous QHS  . feeding supplement (GLUCERNA SHAKE)  237 mL Oral Q24H  . hydrALAZINE  10 mg Intravenous Q6H  . insulin aspart  0-5 Units Subcutaneous QHS  . insulin aspart  0-9 Units Subcutaneous TID WC  . insulin glargine  10 Units Subcutaneous QHS  . nystatin  1 g Topical QID  . potassium chloride  10 mEq Intravenous Q1 Hr x 6  . sodium chloride flush  10-40 mL Intracatheter Q12H   Continuous Infusions: . dextrose 5 % and 0.9% NaCl 40 mL/hr at 02/23/16 1105    Time spent: 28 minutes   LOS: 5 days   Mir Marry Guan  [facility] Pager [phone]  *Please refer to [email or web address], password [email or web address] to get updated schedule on who will round on this patient, as hospitalists switch teams weekly. If 7PM-7AM, please contact night-coverage at [email or web address], password [email or web address] for any overnight needs.  02/24/2016, 1:18 PM

## 2016-02-24 NOTE — Care Management (Signed)
CM spoke with patient's daughter Estill Bamberg in reference to discharge. Estill Bamberg states she is at work. She reports she went to a wedding yesterday after being notified by CSW of the 3 beds available at [facility]. She states she does not approve of 2 of the facilities. She went to work today and is at work now. She states she will not be able to leave work now. Her sister is supposed to come to the hospital.  She is unsure whether her sister has arrived at the hospital yet. She states she called her sister but she did not answer. Discussed the Medicare Important Message, right to appeal and financial responsibility for the days not approved if the appeal is denied. She states she is not concerned about a bill." Estill Bamberg appears to be upset, states we are calling her every 5 minutes. Reports she was told the discharge would be in a few days when she spoke with someone yesterday. Explained the patient's physician has discharged her. She states she will talk to [place] later. CM went to the patient's room. No family in the room to explain the IM. The patient is asleep. Presenter, broadcasting BSN CCM

## 2016-02-24 NOTE — Care Management Important Message (Signed)
Important Message  Patient Details  Name: Katie Dawson MRN: [id number] Date of Birth: [birth year]   Medicare Important Message Given:  Yes (explained to [place], daughter via telephone, copy left in patient's room)    Apolonio Schneiders, RN 02/24/2016, 4:37 PM

## 2016-02-25 ENCOUNTER — Telehealth: Payer: Self-pay

## 2016-02-25 LAB — BASIC METABOLIC PANEL
ANION GAP: 7 (ref 5–15)
BUN: 22 mg/dL — ABNORMAL HIGH (ref 6–20)
CALCIUM: 9.5 mg/dL (ref 8.9–10.3)
CO2: 26 mmol/L (ref 22–32)
CREATININE: 0.91 mg/dL (ref 0.44–1.00)
Chloride: 120 mmol/L — ABNORMAL HIGH (ref 101–111)
GFR calc non Af Amer: 58 mL/min — ABNORMAL LOW (ref >60)
Glucose, Bld: 144 mg/dL — ABNORMAL HIGH (ref 65–99)
Potassium: 3.6 mmol/L (ref 3.5–5.1)
SODIUM: 153 mmol/L — AB (ref 135–145)

## 2016-02-25 LAB — CULTURE, BLOOD (ROUTINE X 2)
CULTURE: NO GROWTH
Culture: NO GROWTH

## 2016-02-25 LAB — GLUCOSE, CAPILLARY
GLUCOSE-CAPILLARY: 140 mg/dL — AB (ref 65–99)
Glucose-Capillary: 122 mg/dL — ABNORMAL HIGH (ref 65–99)
Glucose-Capillary: 176 mg/dL — ABNORMAL HIGH (ref 65–99)
Glucose-Capillary: 150 mg/dL — ABNORMAL HIGH (ref 65–99)

## 2016-02-25 MED ORDER — LIP MEDEX EX OINT
TOPICAL_OINTMENT | CUTANEOUS | Status: AC
  Filled 2016-02-25: qty 7

## 2016-02-25 MED ORDER — FUROSEMIDE 10 MG/ML IJ SOLN
20.0000 mg | Freq: Once | INTRAMUSCULAR | Status: AC
  Administered 2016-02-25: 20 mg via INTRAVENOUS
  Filled 2016-02-25: qty 2

## 2016-02-25 MED ORDER — SODIUM CHLORIDE 0.45 % IV SOLN
INTRAVENOUS | Status: DC
  Administered 2016-02-25 – 2016-03-01 (×8): via INTRAVENOUS

## 2016-02-25 NOTE — Telephone Encounter (Signed)
Home Health Cert/Plan of Care received (02/11/2016 - 04/10/2016) and placed on MD's desk for signature

## 2016-02-25 NOTE — Progress Notes (Signed)
[facility] PROGRESS NOTE    Progress Note  Katie Dawson  YCX:[id number] DOB: Jul 31, 1933 DOA: 02/18/2016 PCP: Cathlean Cower, MD   Brief Narrative:   Katie Dawson is an 82 y.o. female past medical history significant for metastatic renal cell carcinoma with advanced dementia and recurrent UTIs comes in for lethargy. She is being treated for UTI with bacteremia and has been refusing oral medications. PICC line was placed 9/23 in anticipation of SNF discharge on IV medications as family wants to keep patient full code and under full treatment. Ready for d/c to SNF 9/25 but Na level has been rising and we need to correct it prior to discharge.  Assessment/Plan:   Acute kidney injury: In the setting of a UTI and probably mild dehydration. Likely prerenal in etiology she was started on IV fluid hydration her creatinine has improved back to baseline, will recheck renal function intermittently.  Hypernatremia Has been rising steadily as she was placed on D5NACL due to some hypoglycemia when she stopped taking PO. Change fluids to 1/2 NS today. Give a dose of Lasix x1 given hypertension and hypernatremia.  Encephalopathy superimposed on advanced dementia in the setting of a UTI with ecoli bacteremia: She has baseline dementia with likely hospital delirium and infectious encephalopathy worsening the picture. She currently is refusing to take medications and I feel this is indicative of end stage dementia, but daughter is adamant that we need to continue treating her. As such, placed PICC line and SW has found a bed for SNF placement.  Palliative care has been involved and unable to make much headway with family.  UTI with e coli bacteremia: IV Rocephin Trend CBC and repeat blood cultures are negative. She'll need 14 days of antibiotics for her bacteremia.  Hypotension/essential hypertension: Currently resolved with holding antihypertensive medication, but now hypertensive as she's  been refusing PO medications the last few days. Increase clonidine patch, hydralazine scheduled. Continue PRN IV labetalol BP is overall improved now, and acceptable.  History of metastatic renal cell carcinoma with lung metastases: She is currently under hospice care but is a FULL CODE as per daughter she would never want to her mother to be a DNR. Family does not grasp the progression and prognosis of her renal cell carcinoma, palliative Care met with family and there has been no change. Last oncology notes indicated family is aware of her diagnosis and understands no further treatment is available.   Diabetes mellitus type 2: Continue holding glipizide continue sliding scale insulin. Blood glucose is well controlled.  Hypokalemia/hypomagnesium -repleted, stable  DVT prophylaxis: lovenox Family Communication: daughter updated by phone 9/22  Disposition Plan/Barrier to D/C: SNF with hospice services likely 9/26.  Code Status:     Code Status Orders        Start     Ordered   02/18/16 2322  Full code  Continuous     02/18/16 2321    Code Status History    Date Active Date Inactive Code Status Order ID Comments User Context   02/05/2016 12:28 AM 02/09/2016 10:21 PM Full Code [id number]  [name], MD Inpatient   09/13/2015  3:45 PM 09/17/2015  8:36 PM Full Code [id number]  [name], PA-C ED   04/05/2014  6:19 PM 04/07/2014  3:46 PM Full Code [id number]  [name], MD Inpatient   10/16/2013  4:35 PM 10/20/2013  7:17 PM Full Code [id number]  [name], MD Inpatient   05/16/2013 12:05 AM 05/18/2013  8:59 PM Full Code [id number]  [name], MD Inpatient        IV Access:    Peripheral IV   Procedures and diagnostic studies:   No results found.   Medical Consultants:    Palliative care  Anti-Infectives:   Rocephin  Subjective:    Resting, no acute events and no complaints. She is confused, not taking anything PO now. No change in last  24 hours.  Objective:    Vitals:   02/24/16 1400 02/24/16 2128 02/25/16 0000 02/25/16 0550  BP: (!) 135/52 (!) 116/47 (!) 167/67 (!) 161/65  Pulse: 94 88  90  Resp: _0 Temp: 98.7 F (37.1 C) 98.8 F (37.1 C)  99.6 F (37.6 C)  TempSrc: Axillary Axillary  Axillary  SpO2: 98% 96%  98%  Weight:      Height:        Intake/Output Summary (Last 24 hours) at 02/25/16 2633 Last data filed at 02/25/16 0600  Gross per 24 hour  Intake          1056.67 ml  Output              975 ml  Net            81.67 ml   Filed Weights   02/18/16 1250  Weight: 85.3 kg (188 lb)    Exam: General exam: In no acute distress, alert, not oriented Respiratory system: Good air movement and clear to auscultation. Cardiovascular system: S1 & S2 heard, RRR. No JVD, murmurs, rubs, gallops or clicks.  Gastrointestinal system: Abdomen is nondistended, soft and nontender.  Extremities: No pedal edema. Skin: No rashes, lesions or ulcers  Data Reviewed:    Labs: Basic Metabolic Panel:  Recent Labs Lab 02/19/16 0448 02/21/16 0620 02/22/16 0514 02/23/16 1035 02/24/16 0605 02/25/16 0510  NA 146* 145 144 146* 148* 153*  K 3.6 2.9* 3.1* 3.5 3.1* 3.6  CL 117* 114* 111 112* 112* 120*  CO2 _1 GLUCOSE 118* 142* 169* 119* 210* 144*  BUN 44* _2 22*  CREATININE 1.67* 0.87 0.87 0.87 0.91 0.91  CALCIUM 8.6* 8.7* 8.6* 9.1 9.3 9.5  MG 1.5*  --  1.8  --   --   --   PHOS 3.4  --   --   --   --   --    GFR Estimated Creatinine Clearance: 54 mL/min (by C-G formula based on SCr of 0.91 mg/dL). Liver Function Tests:  Recent Labs Lab 02/18/16 1416 02/19/16 0448  AST 14* 13*  ALT 22 18  ALKPHOS 87 86  BILITOT 0.6 0.3  PROT 6.7 5.5*  ALBUMIN 3.0* 2.3*   No results for input(s): LIPASE, AMYLASE in the last 168 hours. No results for input(s): AMMONIA in the last 168 hours. Coagulation profile No results for input(s): INR, PROTIME in the last 168 hours.  CBC:  Recent  Labs Lab 02/18/16 1416 02/19/16 0448 02/21/16 0620 02/22/16 0514 02/24/16 0605  WBC 25.9* 33.2* 17.1* 15.4* 27.3*  NEUTROABS 23.9*  --   --   --   --   HGB 9.9* 8.5* 9.9* 10.2* 10.3*  HCT 31.3* 26.2* 31.7* 31.8* 32.2*  MCV 89.4 86.8 87.8 85.7 85.9  PLT 365 305 295 278 341   Cardiac Enzymes:  Recent Labs Lab 02/18/16 1416  TROPONINI <0.03   BNP (last 3 results) No results for input(s): PROBNP in the last 8760 hours. CBG:  Recent Labs Lab 02/24/16 0723 02/24/16 1148 02/24/16 1720 02/24/16 2124 02/25/16 0731  GLUCAP 205* 168* 153* 156* 122*   D-Dimer: No results for input(s): DDIMER in the last 72 hours. Hgb A1c: No results for input(s): HGBA1C in the last 72 hours. Lipid Profile: No results for input(s): CHOL, HDL, LDLCALC, TRIG, CHOLHDL, LDLDIRECT in the last 72 hours. Thyroid function studies: No results for input(s): TSH, T4TOTAL, T3FREE, THYROIDAB in the last 72 hours.  Invalid input(s): FREET3 Anemia work up: No results for input(s): VITAMINB12, FOLATE, FERRITIN, TIBC, IRON, RETICCTPCT in the last 72 hours. Sepsis Labs:  Recent Labs Lab 02/18/16 1426 02/18/16 1630 02/19/16 0448 02/21/16 0620 02/22/16 0514 02/24/16 0605  WBC  --   --  33.2* 17.1* 15.4* 27.3*  LATICACIDVEN 0.90 1.30  --   --   --   --    Microbiology Recent Results (from the past 240 hour(s))  Urine culture     Status: Abnormal   Collection Time: 02/18/16  3:35 PM  Result Value Ref Range Status   Specimen Description URINE, CLEAN CATCH  Final   Special Requests NONE  Final   Culture >=100,000 COLONIES/mL ESCHERICHIA COLI (A)  Final   Report Status 02/20/2016 FINAL  Final   Organism ID, Bacteria ESCHERICHIA COLI (A)  Final      Susceptibility   Escherichia coli - MIC*    AMPICILLIN <=2 SENSITIVE Sensitive     CEFAZOLIN <=4 SENSITIVE Sensitive     CEFTRIAXONE <=1 SENSITIVE Sensitive     CIPROFLOXACIN <=0.25 SENSITIVE Sensitive     GENTAMICIN <=1 SENSITIVE Sensitive      IMIPENEM <=0.25 SENSITIVE Sensitive     NITROFURANTOIN <=16 SENSITIVE Sensitive     TRIMETH/SULFA <=20 SENSITIVE Sensitive     AMPICILLIN/SULBACTAM <=2 SENSITIVE Sensitive     PIP/TAZO <=4 SENSITIVE Sensitive     Extended ESBL NEGATIVE Sensitive     * >=100,000 COLONIES/mL ESCHERICHIA COLI  Blood culture (routine x 2)     Status: Abnormal   Collection Time: 02/18/16  4:20 PM  Result Value Ref Range Status   Specimen Description BLOOD RIGHT ANTECUBITAL  Final   Special Requests BOTTLES DRAWN AEROBIC ONLY 5CC  Final   Culture  Setup Time   Final    GRAM NEGATIVE RODS AEROBIC BOTTLE ONLY CRITICAL RESULT CALLED TO, READ BACK BY AND VERIFIED WITH: C. SUMME, PHARMD ([place]) AT 1930 ON 02/19/16 BY C. JESSUP, MLT. Performed at [place] (A)  Final   Report Status 02/21/2016 FINAL  Final   Organism ID, Bacteria ESCHERICHIA COLI  Final      Susceptibility   Escherichia coli - MIC*    AMPICILLIN <=2 SENSITIVE Sensitive     CEFAZOLIN <=4 SENSITIVE Sensitive     CEFEPIME <=1 SENSITIVE Sensitive     CEFTAZIDIME <=1 SENSITIVE Sensitive     CEFTRIAXONE <=1 SENSITIVE Sensitive     CIPROFLOXACIN <=0.25 SENSITIVE Sensitive     GENTAMICIN <=1 SENSITIVE Sensitive     IMIPENEM <=0.25 SENSITIVE Sensitive     TRIMETH/SULFA <=20 SENSITIVE Sensitive     AMPICILLIN/SULBACTAM <=2 SENSITIVE Sensitive     PIP/TAZO <=4 SENSITIVE Sensitive     Extended ESBL NEGATIVE Sensitive     * ESCHERICHIA COLI  Blood Culture ID Panel (Reflexed)     Status: Abnormal   Collection Time: 02/18/16  4:20 PM  Result Value Ref Range Status   Enterococcus species NOT DETECTED NOT DETECTED Final  Listeria monocytogenes NOT DETECTED NOT DETECTED Final   Staphylococcus species NOT DETECTED NOT DETECTED Final   Staphylococcus aureus NOT DETECTED NOT DETECTED Final   Streptococcus species NOT DETECTED NOT DETECTED Final   Streptococcus agalactiae NOT DETECTED NOT DETECTED Final   Streptococcus  pneumoniae NOT DETECTED NOT DETECTED Final   Streptococcus pyogenes NOT DETECTED NOT DETECTED Final   Acinetobacter baumannii NOT DETECTED NOT DETECTED Final   Enterobacteriaceae species DETECTED (A) NOT DETECTED Final    Comment: CRITICAL RESULT CALLED TO, READ BACK BY AND VERIFIED WITH: C. SUMME, PHARMD ([facility]) AT 1930 ON 02/19/16 BY C. JESSUP, MLT.    Enterobacter cloacae complex NOT DETECTED NOT DETECTED Final   Escherichia coli DETECTED (A) NOT DETECTED Final    Comment: CRITICAL RESULT CALLED TO, READ BACK BY AND VERIFIED WITH: C. SUMME, PHARMD ([facility]) AT 1930 ON 02/19/16 BY C. JESSUP, MLT.    Klebsiella oxytoca NOT DETECTED NOT DETECTED Final   Klebsiella pneumoniae NOT DETECTED NOT DETECTED Final   Proteus species NOT DETECTED NOT DETECTED Final   Serratia marcescens NOT DETECTED NOT DETECTED Final   Carbapenem resistance NOT DETECTED NOT DETECTED Final   Haemophilus influenzae NOT DETECTED NOT DETECTED Final   Neisseria meningitidis NOT DETECTED NOT DETECTED Final   Pseudomonas aeruginosa NOT DETECTED NOT DETECTED Final   Candida albicans NOT DETECTED NOT DETECTED Final   Candida glabrata NOT DETECTED NOT DETECTED Final   Candida krusei NOT DETECTED NOT DETECTED Final   Candida parapsilosis NOT DETECTED NOT DETECTED Final   Candida tropicalis NOT DETECTED NOT DETECTED Final    Comment: Performed at [facility]  Blood culture (routine x 2)     Status: Abnormal   Collection Time: 02/18/16  5:00 PM  Result Value Ref Range Status   Specimen Description BLOOD LEFT ANTECUBITAL  Final   Special Requests IN PEDIATRIC BOTTLE 2 CC  Final   Culture  Setup Time   Final    GRAM POSITIVE COCCI IN CLUSTERS IN PEDIATRIC BOTTLE CRITICAL RESULT CALLED TO, READ BACK BY AND VERIFIED WITH: T. Pickering Pharm.D. 9:55 02/20/16 ([email or web address])    Culture (A)  Final    STAPHYLOCOCCUS SPECIES (COAGULASE NEGATIVE) THE SIGNIFICANCE OF ISOLATING THIS ORGANISM FROM A SINGLE SET OF BLOOD CULTURES WHEN  MULTIPLE SETS ARE DRAWN IS UNCERTAIN. PLEASE NOTIFY THE MICROBIOLOGY DEPARTMENT WITHIN ONE WEEK IF SPECIATION AND SENSITIVITIES ARE REQUIRED. Performed at [facility]    Report Status 02/21/2016 FINAL  Final  Blood Culture ID Panel (Reflexed)     Status: Abnormal   Collection Time: 02/18/16  5:00 PM  Result Value Ref Range Status   Enterococcus species NOT DETECTED NOT DETECTED Final   Listeria monocytogenes NOT DETECTED NOT DETECTED Final   Staphylococcus species DETECTED (A) NOT DETECTED Final    Comment: CRITICAL RESULT CALLED TO, READ BACK BY AND VERIFIED WITH: T. Pickering Pharm.D. 9:55 02/20/16 ([email or web address])    Staphylococcus aureus NOT DETECTED NOT DETECTED Final   Methicillin resistance NOT DETECTED NOT DETECTED Final   Streptococcus species NOT DETECTED NOT DETECTED Final   Streptococcus agalactiae NOT DETECTED NOT DETECTED Final   Streptococcus pneumoniae NOT DETECTED NOT DETECTED Final   Streptococcus pyogenes NOT DETECTED NOT DETECTED Final   Acinetobacter baumannii NOT DETECTED NOT DETECTED Final   Enterobacteriaceae species NOT DETECTED NOT DETECTED Final   Enterobacter cloacae complex NOT DETECTED NOT DETECTED Final   Escherichia coli NOT DETECTED NOT DETECTED Final   Klebsiella oxytoca NOT DETECTED NOT DETECTED Final  Klebsiella pneumoniae NOT DETECTED NOT DETECTED Final   Proteus species NOT DETECTED NOT DETECTED Final   Serratia marcescens NOT DETECTED NOT DETECTED Final   Haemophilus influenzae NOT DETECTED NOT DETECTED Final   Neisseria meningitidis NOT DETECTED NOT DETECTED Final   Pseudomonas aeruginosa NOT DETECTED NOT DETECTED Final   Candida albicans NOT DETECTED NOT DETECTED Final   Candida glabrata NOT DETECTED NOT DETECTED Final   Candida krusei NOT DETECTED NOT DETECTED Final   Candida parapsilosis NOT DETECTED NOT DETECTED Final   Candida tropicalis NOT DETECTED NOT DETECTED Final    Comment: Performed at [facility]  Culture, blood  (Routine X 2) w Reflex to ID Panel     Status: None (Preliminary result)   Collection Time: 02/20/16 11:47 AM  Result Value Ref Range Status   Specimen Description BLOOD RIGHT ARM  Final   Special Requests BOTTLES DRAWN AEROBIC AND ANAEROBIC 10CC  Final   Culture   Final    NO GROWTH 4 DAYS Performed at [facility]    Report Status PENDING  Incomplete  Culture, blood (Routine X 2) w Reflex to ID Panel     Status: None (Preliminary result)   Collection Time: 02/20/16 11:48 AM  Result Value Ref Range Status   Specimen Description BLOOD RIGHT HAND  Final   Special Requests IN PEDIATRIC BOTTLE [place]  Final   Culture   Final    NO GROWTH 4 DAYS Performed at [facility]    Report Status PENDING  Incomplete     Medications:   . aspirin  300 mg Rectal Daily  . cefTRIAXone (ROCEPHIN)  IV  2 g Intravenous Q24H  . [START ON 02/28/2016] cloNIDine  0.3 mg Transdermal Weekly  . enoxaparin (LOVENOX) injection  40 mg Subcutaneous QHS  . feeding supplement (GLUCERNA SHAKE)  237 mL Oral Q24H  . furosemide  20 mg Intravenous Once  . hydrALAZINE  10 mg Intravenous Q6H  . insulin aspart  0-5 Units Subcutaneous QHS  . insulin aspart  0-9 Units Subcutaneous TID WC  . insulin glargine  10 Units Subcutaneous QHS  . nystatin  1 g Topical QID  . sodium chloride flush  10-40 mL Intracatheter Q12H   Continuous Infusions: . sodium chloride      Time spent: 21 minutes   LOS: 6 days   Mir Marry Guan  [facility] Pager [phone]  *Please refer to [email or web address], password [email or web address] to get updated schedule on who will round on this patient, as hospitalists switch teams weekly. If 7PM-7AM, please contact night-coverage at [email or web address], password [email or web address] for any overnight needs.  02/25/2016, 9:38 AM

## 2016-02-25 NOTE — Progress Notes (Signed)
[facility] provided patient daughter Davy Pique with updated 9/25 SNF list. Patient daughter reports she will have a SNF decision tomorrow morning. L[facility]WA provided daughter with [facility] contact information/ will follow up in the a.m

## 2016-02-26 ENCOUNTER — Encounter (HOSPITAL_COMMUNITY): Payer: Self-pay

## 2016-02-26 LAB — BASIC METABOLIC PANEL
ANION GAP: 8 (ref 5–15)
BUN: 25 mg/dL — ABNORMAL HIGH (ref 6–20)
CO2: 23 mmol/L (ref 22–32)
Calcium: 9.3 mg/dL (ref 8.9–10.3)
Chloride: 120 mmol/L — ABNORMAL HIGH (ref 101–111)
Creatinine, Ser: 0.99 mg/dL (ref 0.44–1.00)
GFR calc Af Amer: >60 mL/min (ref >60)
GFR, EST NON AFRICAN AMERICAN: 53 mL/min — AB (ref >60)
GLUCOSE: 130 mg/dL — AB (ref 65–99)
POTASSIUM: 3.2 mmol/L — AB (ref 3.5–5.1)
Sodium: 151 mmol/L — ABNORMAL HIGH (ref 135–145)

## 2016-02-26 LAB — GLUCOSE, CAPILLARY
GLUCOSE-CAPILLARY: 143 mg/dL — AB (ref 65–99)
GLUCOSE-CAPILLARY: 125 mg/dL — AB (ref 65–99)
Glucose-Capillary: 141 mg/dL — ABNORMAL HIGH (ref 65–99)
Glucose-Capillary: 122 mg/dL — ABNORMAL HIGH (ref 65–99)

## 2016-02-26 MED ORDER — POTASSIUM CHLORIDE 10 MEQ/50ML IV SOLN
10.0000 meq | INTRAVENOUS | Status: AC
  Administered 2016-02-26 (×4): 10 meq via INTRAVENOUS
  Filled 2016-02-26 (×4): qty 50

## 2016-02-26 NOTE — Progress Notes (Signed)
We have set up family meeting for tomorrow at 9:00 AM.  Continue current care plan for now.  She remains full code.  Micheline Rough, MD [place] Team [phone]

## 2016-02-26 NOTE — Progress Notes (Signed)
[facility] PROGRESS NOTE    Progress Note  Katie Dawson  EVO:[id number] DOB: 28-Sep-1933 DOA: 02/18/2016 PCP: Cathlean Cower, MD   Brief Narrative:   Katie Dawson is an 82 y.o. female past medical history significant for metastatic renal cell carcinoma with advanced dementia and recurrent UTIs comes in for lethargy. She is being treated for UTI with bacteremia and has been refusing oral medications. PICC line was placed 9/23 in anticipation of SNF discharge on IV medications as family wants to keep patient full code and under full treatment. Ready for d/c to SNF 9/25 but Na level has been rising and we need to correct it prior to discharge. Today I had a chance to talk to daughter Katie Dawson face to face and we had a long conversation about her mother's situation. I am concerned that we may be forced to do more harm than good if we are not realistic about her illness and prognosis. She was seen by Palliative care earlier in her stay, but today I've spoken with Dr. Domingo Cocking who will talk with family again.  Assessment/Plan:   Acute kidney injury: In the setting of a UTI and probably mild dehydration. Likely prerenal in etiology she was started on IV fluid hydration her creatinine has improved back to baseline, will recheck renal function intermittently.  Hypernatremia Has been rising steadily as she was placed on D5NACL due to some hypoglycemia when she stopped taking PO. Change fluids to 1/2 NS 9/25. Also gave a dose of Lasix x1 given hypertension and hypernatremia. Continue IVF for now.  Encephalopathy superimposed on advanced dementia in the setting of a UTI with ecoli bacteremia: She has baseline dementia with likely hospital delirium and infectious encephalopathy worsening the picture. She currently is refusing to take medications and I feel this is indicative of end stage dementia, but daughter is adamant that we need to continue treating her. As such, placed PICC line and SW has  found a bed for SNF placement.  Palliative care has been involved and unable to make much headway with family.  UTI with e coli bacteremia: IV Rocephin Trend CBC and repeat blood cultures are negative. She'll need 14 days of antibiotics for her bacteremia.  Hypotension/essential hypertension: Currently resolved with holding antihypertensive medication, but now hypertensive as she's been refusing PO medications the last few days. Increase clonidine patch, hydralazine scheduled. Continue PRN IV labetalol BP is overall improved now, and acceptable.  History of metastatic renal cell carcinoma with lung metastases: She is currently under hospice care but is a FULL CODE as per daughter she would never want to her mother to be a DNR. Family does not grasp the progression and prognosis of her renal cell carcinoma, palliative Care met with family and there has been no change. Last oncology notes indicated family is aware of her diagnosis and understands no further treatment is available.   Diabetes mellitus type 2: Continue holding glipizide continue sliding scale insulin. Blood glucose is well controlled.  Hypokalemia/hypomagnesium -repleted, stable  DVT prophylaxis: lovenox Family Communication: Daughter updated by phone 9/22, discussed in person 9/26.  Disposition Plan/Barrier to D/C: SNF with hospice services likely 9/26.  Family meeting with daughter Katie Dawson today. We had a long and somewhat productive discussion. She feels guilt that she was convinced to make her father a DNR and he did at [facility]. I advised her that no matter what we do, her advanced dementia and advancing cancer will kill her mother sooner or later, and that we  need to do no harm, while doing what her mother would want done if she could communicate her wishes with us. She seemed to have good insight into her mother's disease but doesn't want to feel like she's giving up on her mother. I appreciate Dr. Freeman's plan to  contact the family today and have a family meeting tomorrow. I hope the patient can be made comfort care and discharge to a facility with hospice services soon.  Code Status:     Code Status Orders        Start     Ordered   02/18/16 2322  Full code  Continuous     02/18/16 2321    Code Status History    Date Active Date Inactive Code Status Order ID Comments User Context   02/05/2016 12:28 AM 02/09/2016 10:21 PM Full Code [id number]  David Manuel Ortiz, MD Inpatient   09/13/2015  3:45 PM 09/17/2015  8:36 PM Full Code [id number]  Sara E Wertman, PA-C ED   04/05/2014  6:19 PM 04/07/2014  3:46 PM Full Code [id number]  Ripudeep K Rai, MD Inpatient   10/16/2013  4:35 PM 10/20/2013  7:17 PM Full Code [id number]  Shanker M Ghimire, MD Inpatient   05/16/2013 12:05 AM 05/18/2013  8:59 PM Full Code [id number]  Arshad N Kakrakandy, MD Inpatient        IV Access:    Peripheral IV   Procedures and diagnostic studies:   No results found.   Medical Consultants:    Palliative care  Anti-Infectives:   Rocephin  Subjective:    Resting, no acute events and no complaints. She is confused, not taking anything PO now. No change in last 24 hours.  Objective:    Vitals:   02/25/16 0550 02/25/16 1645 02/25/16 2118 02/26/16 0520  BP: (!) 161/65 (!) 163/113 (!) 161/59 (!) 164/74  Pulse: 90 (!) 125 86 97  Resp: 19 (!) 32 (!) 34 20  Temp: 99.6 F (37.6 C) 99.2 F (37.3 C) 98.9 F (37.2 C) 99 F (37.2 C)  TempSrc: Axillary Oral Oral Axillary  SpO2: 98% 98% 100% 98%  Weight:      Height:        Intake/Output Summary (Last 24 hours) at 02/26/16 1128 Last data filed at 02/26/16 0525  Gross per 24 hour  Intake              960 ml  Output             1200 ml  Net             -240 ml   Filed Weights   02/18/16 1250  Weight: 85.3 kg (188 lb)    Exam: General exam: In no acute distress, alert, not oriented Respiratory system: Good air movement and clear to auscultation. Cardiovascular  system: S1 & S2 heard, RRR. No JVD, murmurs, rubs, gallops or clicks.  Gastrointestinal system: Abdomen is nondistended, soft and nontender.  Extremities: No pedal edema. Skin: No rashes, lesions or ulcers  Data Reviewed:    Labs: Basic Metabolic Panel:  Recent Labs Lab 02/22/16 0514 02/23/16 1035 02/24/16 0605 02/25/16 0510 02/26/16 0405  NA 144 146* 148* 153* 151*  K 3.1* 3.5 3.1* 3.6 3.2*  CL 111 112* 112* 120* 120*  CO2 26 26 26 26 23  GLUCOSE 169* 119* 210* 144* 130*  BUN 13 15 18 22* 25*  CREATININE 0.87 0.87 0.91 0.91 0.99  CALCIUM 8.6* 9.1 9.3 9.5 9.3  MG   1.8  --   --   --   --    GFR Estimated Creatinine Clearance: 49.7 mL/min (by C-G formula based on SCr of 0.99 mg/dL). Liver Function Tests: No results for input(s): AST, ALT, ALKPHOS, BILITOT, PROT, ALBUMIN in the last 168 hours. No results for input(s): LIPASE, AMYLASE in the last 168 hours. No results for input(s): AMMONIA in the last 168 hours. Coagulation profile No results for input(s): INR, PROTIME in the last 168 hours.  CBC:  Recent Labs Lab 02/21/16 0620 02/22/16 0514 02/24/16 0605  WBC 17.1* 15.4* 27.3*  HGB 9.9* 10.2* 10.3*  HCT 31.7* 31.8* 32.2*  MCV 87.8 85.7 85.9  PLT 295 278 341   Cardiac Enzymes: No results for input(s): CKTOTAL, CKMB, CKMBINDEX, TROPONINI in the last 168 hours. BNP (last 3 results) No results for input(s): PROBNP in the last 8760 hours. CBG:  Recent Labs Lab 02/25/16 1307 02/25/16 1735 02/25/16 2113 02/26/16 0740 02/26/16 1111  GLUCAP 176* 150* 140* 141* 143*   D-Dimer: No results for input(s): DDIMER in the last 72 hours. Hgb A1c: No results for input(s): HGBA1C in the last 72 hours. Lipid Profile: No results for input(s): CHOL, HDL, LDLCALC, TRIG, CHOLHDL, LDLDIRECT in the last 72 hours. Thyroid function studies: No results for input(s): TSH, T4TOTAL, T3FREE, THYROIDAB in the last 72 hours.  Invalid input(s): FREET3 Anemia work up: No results for  input(s): VITAMINB12, FOLATE, FERRITIN, TIBC, IRON, RETICCTPCT in the last 72 hours. Sepsis Labs:  Recent Labs Lab 02/21/16 0620 02/22/16 0514 02/24/16 0605  WBC 17.1* 15.4* 27.3*   Microbiology Recent Results (from the past 240 hour(s))  Urine culture     Status: Abnormal   Collection Time: 02/18/16  3:35 PM  Result Value Ref Range Status   Specimen Description URINE, CLEAN CATCH  Final   Special Requests NONE  Final   Culture >=100,000 COLONIES/mL ESCHERICHIA COLI (A)  Final   Report Status 02/20/2016 FINAL  Final   Organism ID, Bacteria ESCHERICHIA COLI (A)  Final      Susceptibility   Escherichia coli - MIC*    AMPICILLIN <=2 SENSITIVE Sensitive     CEFAZOLIN <=4 SENSITIVE Sensitive     CEFTRIAXONE <=1 SENSITIVE Sensitive     CIPROFLOXACIN <=0.25 SENSITIVE Sensitive     GENTAMICIN <=1 SENSITIVE Sensitive     IMIPENEM <=0.25 SENSITIVE Sensitive     NITROFURANTOIN <=16 SENSITIVE Sensitive     TRIMETH/SULFA <=20 SENSITIVE Sensitive     AMPICILLIN/SULBACTAM <=2 SENSITIVE Sensitive     PIP/TAZO <=4 SENSITIVE Sensitive     Extended ESBL NEGATIVE Sensitive     * >=100,000 COLONIES/mL ESCHERICHIA COLI  Blood culture (routine x 2)     Status: Abnormal   Collection Time: 02/18/16  4:20 PM  Result Value Ref Range Status   Specimen Description BLOOD RIGHT ANTECUBITAL  Final   Special Requests BOTTLES DRAWN AEROBIC ONLY 5CC  Final   Culture  Setup Time   Final    GRAM NEGATIVE RODS AEROBIC BOTTLE ONLY CRITICAL RESULT CALLED TO, READ BACK BY AND VERIFIED WITH: C. SUMME, PHARMD ([place]) AT 1930 ON 02/19/16 BY C. JESSUP, MLT. Performed at [facility] (A)  Final   Report Status 02/21/2016 FINAL  Final   Organism ID, Bacteria ESCHERICHIA COLI  Final      Susceptibility   Escherichia coli - MIC*    AMPICILLIN <=2 SENSITIVE Sensitive     CEFAZOLIN <=4 SENSITIVE Sensitive  CEFEPIME <=1 SENSITIVE Sensitive     CEFTAZIDIME <=1 SENSITIVE Sensitive       CEFTRIAXONE <=1 SENSITIVE Sensitive     CIPROFLOXACIN <=0.25 SENSITIVE Sensitive     GENTAMICIN <=1 SENSITIVE Sensitive     IMIPENEM <=0.25 SENSITIVE Sensitive     TRIMETH/SULFA <=20 SENSITIVE Sensitive     AMPICILLIN/SULBACTAM <=2 SENSITIVE Sensitive     PIP/TAZO <=4 SENSITIVE Sensitive     Extended ESBL NEGATIVE Sensitive     * ESCHERICHIA COLI  Blood Culture ID Panel (Reflexed)     Status: Abnormal   Collection Time: 02/18/16  4:20 PM  Result Value Ref Range Status   Enterococcus species NOT DETECTED NOT DETECTED Final   Listeria monocytogenes NOT DETECTED NOT DETECTED Final   Staphylococcus species NOT DETECTED NOT DETECTED Final   Staphylococcus aureus NOT DETECTED NOT DETECTED Final   Streptococcus species NOT DETECTED NOT DETECTED Final   Streptococcus agalactiae NOT DETECTED NOT DETECTED Final   Streptococcus pneumoniae NOT DETECTED NOT DETECTED Final   Streptococcus pyogenes NOT DETECTED NOT DETECTED Final   Acinetobacter baumannii NOT DETECTED NOT DETECTED Final   Enterobacteriaceae species DETECTED (A) NOT DETECTED Final    Comment: CRITICAL RESULT CALLED TO, READ BACK BY AND VERIFIED WITH: C. SUMME, PHARMD ([place]) AT 1930 ON 02/19/16 BY C. JESSUP, MLT.    Enterobacter cloacae complex NOT DETECTED NOT DETECTED Final   Escherichia coli DETECTED (A) NOT DETECTED Final    Comment: CRITICAL RESULT CALLED TO, READ BACK BY AND VERIFIED WITH: C. SUMME, PHARMD ([facility]) AT 1930 ON 02/19/16 BY C. JESSUP, MLT.    Klebsiella oxytoca NOT DETECTED NOT DETECTED Final   Klebsiella pneumoniae NOT DETECTED NOT DETECTED Final   Proteus species NOT DETECTED NOT DETECTED Final   Serratia marcescens NOT DETECTED NOT DETECTED Final   Carbapenem resistance NOT DETECTED NOT DETECTED Final   Haemophilus influenzae NOT DETECTED NOT DETECTED Final   Neisseria meningitidis NOT DETECTED NOT DETECTED Final   Pseudomonas aeruginosa NOT DETECTED NOT DETECTED Final   Candida albicans NOT DETECTED NOT  DETECTED Final   Candida glabrata NOT DETECTED NOT DETECTED Final   Candida krusei NOT DETECTED NOT DETECTED Final   Candida parapsilosis NOT DETECTED NOT DETECTED Final   Candida tropicalis NOT DETECTED NOT DETECTED Final    Comment: Performed at [facility]  Blood culture (routine x 2)     Status: Abnormal   Collection Time: 02/18/16  5:00 PM  Result Value Ref Range Status   Specimen Description BLOOD LEFT ANTECUBITAL  Final   Special Requests IN PEDIATRIC BOTTLE 2 CC  Final   Culture  Setup Time   Final    GRAM POSITIVE COCCI IN CLUSTERS IN PEDIATRIC BOTTLE CRITICAL RESULT CALLED TO, READ BACK BY AND VERIFIED WITH: T. Pickering Pharm.D. 9:55 02/20/16 ([email or web address])    Culture (A)  Final    STAPHYLOCOCCUS SPECIES (COAGULASE NEGATIVE) THE SIGNIFICANCE OF ISOLATING THIS ORGANISM FROM A SINGLE SET OF BLOOD CULTURES WHEN MULTIPLE SETS ARE DRAWN IS UNCERTAIN. PLEASE NOTIFY THE MICROBIOLOGY DEPARTMENT WITHIN ONE WEEK IF SPECIATION AND SENSITIVITIES ARE REQUIRED. Performed at [facility]    Report Status 02/21/2016 FINAL  Final  Blood Culture ID Panel (Reflexed)     Status: Abnormal   Collection Time: 02/18/16  5:00 PM  Result Value Ref Range Status   Enterococcus species NOT DETECTED NOT DETECTED Final   Listeria monocytogenes NOT DETECTED NOT DETECTED Final   Staphylococcus species DETECTED (A) NOT DETECTED Final      Comment: CRITICAL RESULT CALLED TO, READ BACK BY AND VERIFIED WITH: T. Pickering Pharm.D. 9:55 02/20/16 ([email or web address])    Staphylococcus aureus NOT DETECTED NOT DETECTED Final   Methicillin resistance NOT DETECTED NOT DETECTED Final   Streptococcus species NOT DETECTED NOT DETECTED Final   Streptococcus agalactiae NOT DETECTED NOT DETECTED Final   Streptococcus pneumoniae NOT DETECTED NOT DETECTED Final   Streptococcus pyogenes NOT DETECTED NOT DETECTED Final   Acinetobacter baumannii NOT DETECTED NOT DETECTED Final   Enterobacteriaceae species NOT DETECTED NOT  DETECTED Final   Enterobacter cloacae complex NOT DETECTED NOT DETECTED Final   Escherichia coli NOT DETECTED NOT DETECTED Final   Klebsiella oxytoca NOT DETECTED NOT DETECTED Final   Klebsiella pneumoniae NOT DETECTED NOT DETECTED Final   Proteus species NOT DETECTED NOT DETECTED Final   Serratia marcescens NOT DETECTED NOT DETECTED Final   Haemophilus influenzae NOT DETECTED NOT DETECTED Final   Neisseria meningitidis NOT DETECTED NOT DETECTED Final   Pseudomonas aeruginosa NOT DETECTED NOT DETECTED Final   Candida albicans NOT DETECTED NOT DETECTED Final   Candida glabrata NOT DETECTED NOT DETECTED Final   Candida krusei NOT DETECTED NOT DETECTED Final   Candida parapsilosis NOT DETECTED NOT DETECTED Final   Candida tropicalis NOT DETECTED NOT DETECTED Final    Comment: Performed at [facility]  Culture, blood (Routine X 2) w Reflex to ID Panel     Status: None   Collection Time: 02/20/16 11:47 AM  Result Value Ref Range Status   Specimen Description BLOOD RIGHT ARM  Final   Special Requests BOTTLES DRAWN AEROBIC AND ANAEROBIC 10CC  Final   Culture   Final    NO GROWTH 5 DAYS Performed at [facility]    Report Status 02/25/2016 FINAL  Final  Culture, blood (Routine X 2) w Reflex to ID Panel     Status: None   Collection Time: 02/20/16 11:48 AM  Result Value Ref Range Status   Specimen Description BLOOD RIGHT HAND  Final   Special Requests IN PEDIATRIC BOTTLE 1CC  Final   Culture   Final    NO GROWTH 5 DAYS Performed at [facility]    Report Status 02/25/2016 FINAL  Final     Medications:   . aspirin  300 mg Rectal Daily  . cefTRIAXone (ROCEPHIN)  IV  2 g Intravenous Q24H  . [START ON 02/28/2016] cloNIDine  0.3 mg Transdermal Weekly  . enoxaparin (LOVENOX) injection  40 mg Subcutaneous QHS  . feeding supplement (GLUCERNA SHAKE)  237 mL Oral Q24H  . hydrALAZINE  10 mg Intravenous Q6H  . insulin aspart  0-5 Units Subcutaneous QHS  . insulin  aspart  0-9 Units Subcutaneous TID WC  . insulin glargine  10 Units Subcutaneous QHS  . nystatin  1 g Topical QID  . potassium chloride  10 mEq Intravenous Q1 Hr x 4  . sodium chloride flush  10-40 mL Intracatheter Q12H   Continuous Infusions: . sodium chloride 75 mL/hr at 02/26/16 0403    Time spent: 49 minutes   LOS: 7 days    Mohammed   [facility] Pager [phone]  *Please refer to [email or web address], password [email or web address] to get updated schedule on who will round on this patient, as hospitalists switch teams weekly. If 7PM-7AM, please contact night-coverage at [email or web address], password [email or web address] for any overnight needs.  02/26/2016, 11:28 AM        

## 2016-02-27 ENCOUNTER — Ambulatory Visit: Payer: Medicare Other | Admitting: Internal Medicine

## 2016-02-27 DIAGNOSIS — G9340 Encephalopathy, unspecified: Secondary | ICD-10-CM

## 2016-02-27 DIAGNOSIS — N189 Chronic kidney disease, unspecified: Secondary | ICD-10-CM

## 2016-02-27 DIAGNOSIS — N179 Acute kidney failure, unspecified: Secondary | ICD-10-CM

## 2016-02-27 DIAGNOSIS — N390 Urinary tract infection, site not specified: Secondary | ICD-10-CM

## 2016-02-27 LAB — BASIC METABOLIC PANEL
ANION GAP: 8 (ref 5–15)
BUN: 24 mg/dL — ABNORMAL HIGH (ref 6–20)
CALCIUM: 9.1 mg/dL (ref 8.9–10.3)
CO2: 24 mmol/L (ref 22–32)
Chloride: 119 mmol/L — ABNORMAL HIGH (ref 101–111)
Creatinine, Ser: 0.88 mg/dL (ref 0.44–1.00)
GFR calc non Af Amer: >60 mL/min (ref >60)
Glucose, Bld: 100 mg/dL — ABNORMAL HIGH (ref 65–99)
POTASSIUM: 3.5 mmol/L (ref 3.5–5.1)
Sodium: 151 mmol/L — ABNORMAL HIGH (ref 135–145)

## 2016-02-27 LAB — GLUCOSE, CAPILLARY
GLUCOSE-CAPILLARY: 111 mg/dL — AB (ref 65–99)
GLUCOSE-CAPILLARY: 103 mg/dL — AB (ref 65–99)
Glucose-Capillary: 89 mg/dL (ref 65–99)
Glucose-Capillary: 92 mg/dL (ref 65–99)

## 2016-02-27 LAB — SODIUM: SODIUM: 152 mmol/L — AB (ref 135–145)

## 2016-02-27 MED ORDER — ENALAPRILAT 1.25 MG/ML IV SOLN
0.6250 mg | Freq: Four times a day (QID) | INTRAVENOUS | Status: DC
  Administered 2016-02-27 – 2016-02-28 (×5): 0.625 mg via INTRAVENOUS
  Filled 2016-02-27 (×5): qty 0.5

## 2016-02-27 MED ORDER — HYDRALAZINE HCL 20 MG/ML IJ SOLN
20.0000 mg | Freq: Four times a day (QID) | INTRAMUSCULAR | Status: DC
  Administered 2016-02-27 – 2016-03-01 (×11): 20 mg via INTRAVENOUS
  Filled 2016-02-27 (×11): qty 1

## 2016-02-27 NOTE — Progress Notes (Signed)
Daily Progress Note   Patient Name: Katie Dawson       Date: 02/27/2016 DOB: Jun 05, 1933  Age: 82 y.o. MRN#: [id number] Attending Physician: Donne Hazel, MD Primary Care Physician: Cathlean Cower, MD Admit Date: 02/18/2016  Reason for Consultation/Follow-up: Establishing goals of care  Subjective: Met this morning with patient's family including her 2 daughters, her sons-in-law, and 2 grandchildren.  Continue discussion about her clinical course to this point in time and reviewed the fact that she has had significant change to her cognition, functional status, and nutrition over the past several months. We also talked about the continued decline that she has shown during this hospitalization and the fact that she remains unable to maintain her own nutrition and hydration orally. We talked about recommendation against tube feeding and pathways forward including continue with current care versus refocusing care with a comfort based approach.  We discussed CODE STATUS and the fact that increasing the aggressiveness of the care that she receives, particularly in the event of cardiac or respiratory arrest, would not be a pathway towards her every get well enough to leave the hospital.  Her family suffered significant trauma emotionally with the death of the patient's husband 4 years ago. One daughter in particular admits that she still feels a lot of guilt regarding ending this and this is likely influencing her decision when she states that she believes her mother would want to maintain aggressive care as she approached the end of her life.  We discussed pathways forward at length including potential for residential hospice. Family reports needing time to process this may want to see how she does over  the next 24 hours with a plan to try and correct her hypernatremia to see if this improves her mental status.  Length of Stay: 8  Current Medications: Scheduled Meds:  . aspirin  300 mg Rectal Daily  . cefTRIAXone (ROCEPHIN)  IV  2 g Intravenous Q24H  . [START ON 02/28/2016] cloNIDine  0.3 mg Transdermal Weekly  . enalaprilat  0.625 mg Intravenous Q6H  . enoxaparin (LOVENOX) injection  40 mg Subcutaneous QHS  . feeding supplement (GLUCERNA SHAKE)  237 mL Oral Q24H  . hydrALAZINE  20 mg Intravenous Q6H  . insulin aspart  0-5 Units Subcutaneous QHS  . insulin aspart  0-9 Units Subcutaneous  TID WC  . insulin glargine  10 Units Subcutaneous QHS  . nystatin  1 g Topical QID  . sodium chloride flush  10-40 mL Intracatheter Q12H    Continuous Infusions: . sodium chloride 100 mL/hr at 02/27/16 1017    PRN Meds: acetaminophen (TYLENOL) oral liquid 160 mg/5 mL, labetalol, lip balm, ondansetron **OR** ondansetron (ZOFRAN) IV, sodium chloride flush  Physical Exam    General exam: In no acute distress, alert, not oriented Respiratory system: Good air movement and clear to auscultation. Cardiovascular system: S1 & S2 heard, RRR. No JVD, murmurs, rubs, gallops or clicks.  Gastrointestinal system: Abdomen is nondistended, soft and nontender.  Extremities: Some pedal edema. Skin: No rashes, lesions or ulcers       Vital Signs: BP (!) 181/67   Pulse 80   Temp 98.8 F (37.1 C) (Oral)   Resp 20   Ht _0  (1.651 m)   Wt 85.3 kg (188 lb)   SpO2 97%   BMI 31.28 kg/m  SpO2: SpO2: 97 % O2 Device: O2 Device: Not Delivered O2 Flow Rate:    Intake/output summary:  Intake/Output Summary (Last 24 hours) at 02/27/16 1359 Last data filed at 02/27/16 1321  Gross per 24 hour  Intake          1663.42 ml  Output             1075 ml  Net           588.42 ml   LBM: Last BM Date:  (PTA?) Baseline Weight: Weight: 85.3 kg (188 lb) Most recent weight: Weight: 85.3 kg (188 lb)       Palliative  Assessment/Data:    Flowsheet Rows   Flowsheet Row Most Recent Value  Intake Tab  Referral Department  Hospitalist  Unit at Time of Referral  Med/Surg Unit  Palliative Care Primary Diagnosis  Cancer  Date Notified  02/18/16  Palliative Care Type  New Palliative care  Reason for referral  Clarify Goals of Care  Date of Admission  02/18/16  Date first seen by Palliative Care  02/19/16  # of days Palliative referral response time  1 Day(s)  # of days IP prior to Palliative referral  0  Clinical Assessment  Palliative Performance Scale Score  30%  Pain Max last 24 hours  Not able to report  Pain Min Last 24 hours  Not able to report  Dyspnea Max Last 24 Hours  Not able to report  Dyspnea Min Last 24 hours  Not able to report  Psychosocial & Spiritual Assessment  Palliative Care Outcomes  Patient/Family meeting held?  Yes  Who was at the meeting?  daughter Katie Dawson  Palliative Care Outcomes  Provided end of life care assistance, Provided advance care planning, Provided psychosocial or spiritual support, Counseled regarding hospice  Palliative Care follow-up planned  -- [follow up while at [facility]]      Patient Active Problem List   Diagnosis Date Noted  . Protein-calorie malnutrition, severe 02/21/2016  . Hypokalemia 02/21/2016  . Bacteremia   . Palliative care encounter   . DNR (do not resuscitate) discussion   . Goals of care, counseling/discussion   . DM type 2 causing CKD stage 3 ([place]) 02/18/2016  . Hypotension 02/18/2016  . Metastatic cancer to lung ([place]) 02/08/2016  . Acute encephalopathy   . AKI (acute kidney injury) ([place])   . External hemorrhoid, thrombosed 01/04/2016  . Hematochezia 01/04/2016  . Syncope 10/07/2015  . Lethargy 09/13/2015  .  Dizziness 04/05/2014  . Abnormal urine odor 02/07/2014  . Complicated UTI (urinary tract infection) 10/17/2013  . Hypoglycemia 10/16/2013  . Heart murmur 09/16/2013  . Agitation 09/16/2013  . Back pain 05/18/2013  . Altered  mental state 05/15/2013  . Lower extremity weakness 05/15/2013  . ARF (acute renal failure) ([place]) 05/15/2013  . Leucocytosis 05/15/2013  . Peripheral neuropathy ([place]) 05/15/2013  . Aortic stenosis 02/10/2013  . Dementia 02/10/2013  . Vitamin D deficiency 04/10/2012  . Depression 04/10/2012  . Confusion 04/08/2012  . Closed fibular fracture 12/24/2011  . Wrist fracture, closed 12/24/2011  . Fatigue 11/05/2010  . Preventative health care 11/03/2010  . INTERMITTENT VERTIGO 04/20/2009  . OSTEOPENIA 02/12/2009  . FOOT PAIN, LEFT 12/23/2007  . Insulin dependent diabetes mellitus ([place]) 04/22/2007  . HLD (hyperlipidemia) 04/22/2007  . GOUT 04/22/2007  . ANEMIA-IRON DEFICIENCY 04/22/2007  . ANXIETY 04/22/2007  . Sleep apnea 04/22/2007  . Essential hypertension 04/22/2007  . GERD 04/22/2007  . Disorder resulting from impaired renal function 04/22/2007  . 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 COLONIC POLYPS, HX OF 04/22/2007  . NEPHRECTOMY, HX OF 04/22/2007    Palliative Care Assessment & Plan   Recommendations/Plan:  Family is clear that patient would not want feeding tube.  We discussed her current condition including hypernatremia and the fact that this is related to poor intake at length today.  Plan to work to correct hypernatremia to see if this is reversible cause of her decreased mental status.  I shared with family that I did not believe that this was the case and this was more progression of her underlying disease processes.  If no reversible causes with improvement in hypernatremia, we will need to develop plan on how to best move forward understanding that she is approaching the end of her life.    Again touched on CODE STATUS with family but did not pursue conversation aggressively as this has been an emotional block for family. No change at this time.  Repeat meeting tomorrow at 9 AM to continue conversation   Goals of Care and Additional  Recommendations:  Limitations on Scope of Treatment: Full Scope Treatment  Code Status:    Code Status Orders        Start     Ordered   02/18/16 2322  Full code  Continuous     02/18/16 2321    Code Status History    Date Active Date Inactive Code Status Order ID Comments User Context   02/05/2016 12:28 AM 02/09/2016 10:21 PM Full Code [id number]  [name], MD Inpatient   09/13/2015  3:45 PM 09/17/2015  8:36 PM Full Code [id number]  [name], PA-C ED   04/05/2014  6:19 PM 04/07/2014  3:46 PM Full Code [id number]  [name], MD Inpatient   10/16/2013  4:35 PM 10/20/2013  7:17 PM Full Code [id number]  [name], MD Inpatient   05/16/2013 12:05 AM 05/18/2013  8:59 PM Full Code [id number]  [name], MD Inpatient       Prognosis:   Unable to determine, however her prognosis would be less than 2 weeks if focus were to transition to comfort as she Is already hypernatremic and without significant PO intake.  Discharge Planning:  To Be Determined  Care plan was discussed with family, Dr. Wyline Copas  Thank you for allowing the Palliative Medicine Team to assist in the care of this patient.   Time In: 0900 Time Out: 1000 Total Time 60  Prolonged Time Billed No      Greater than 50%  of this time was spent counseling and coordinating care related to the above assessment and plan.  Micheline Rough, MD  Please contact Palliative Medicine Team phone at [phone] for questions and concerns.

## 2016-02-27 NOTE — Progress Notes (Signed)
[facility] PROGRESS NOTE    Progress Note  Katie Dawson  [id number] DOB: Jul 06, 1933 DOA: 02/18/2016 PCP: Cathlean Cower, MD   Brief Narrative:   Katie Dawson is an 82 y.o. female past medical history significant for metastatic renal cell carcinoma with advanced dementia and recurrent UTIs comes in for lethargy. She is being treated for UTI with bacteremia and has been refusing oral medications. PICC line was placed 9/23 in anticipation of SNF discharge on IV medications as family wants to keep patient full code and under full treatment. Ready for d/c to SNF 9/25 but Na level has been rising and we need to correct it prior to discharge. Today I had a chance to talk to daughter [name] face to face and we had a long conversation about her mother's situation. I am concerned that we may be forced to do more harm than good if we are not realistic about her illness and prognosis. She was seen by Palliative care earlier in her stay, but today I've spoken with Dr. Domingo Cocking who will talk with family again.  Assessment/Plan:   Acute kidney injury: Suspect secondary to UTI and probably mild dehydration. Likely prerenal in etiology Patient Continued on IV fluids. BUN is worsening and trending up (peak of 25, today 24) Continue IV fluids  Hypernatremia Suspect secondary to decreased by mouth intake. Sodium had worsened 153 remains elevated today. Patient currently on half-normal saline started on 925. Will increase rate to 100 mL now her today. We'll repeat sodium level this afternoon.  Encephalopathy superimposed on advanced dementia in the setting of a UTI with ecoli bacteremia: She has baseline dementia with likely hospital delirium and infectious encephalopathy worsening the picture. She currently is refusing to take medications and I feel this is indicative of end stage dementia, but daughter is adamant that we need to continue treating her. As such, placed PICC line and SW has found a  bed for SNF placement.  Palliative care consulted. Appreciate input. Patient remains full code at this time.  UTI with e coli bacteremia: We'll continue on IV Rocephin Most recent white blood count of 27.3 thousand which is elevated. She'll need 14 days of antibiotics for her bacteremia.  Hypotension/essential hypertension: Currently resolved with holding antihypertensive medication, but now hypertensive as she's been refusing PO medications the last few days. Increase clonidine patch, hydralazine scheduled. Continue PRN IV labetalol This morning, patient's blood pressure noted be markedly elevated. Have added enalaprilat and increased scheduled hydralazine to 20 mg every 6  History of metastatic renal cell carcinoma with lung metastases: Appreciate input by palliative care. There seems to be an element of denial regarding patient's long-term prognosis. For now, patient remains full CODE STATUS. Palliative care to continue to readdress CODE STATUS and even possible hospice care down the road.  Diabetes mellitus type 2: Continue holding glipizide continue sliding scale insulin. Blood glucose is well controlled.  Hypokalemia/hypomagnesium -Replaced  DVT prophylaxis: lovenox Family Communication: Patient in room, family not at bedside  Disposition Plan/Barrier to D/C: SNF with hospice services anticipated, timing uncertain.   Code Status:     Code Status Orders        Start     Ordered   02/18/16 2322  Full code  Continuous     02/18/16 2321    Code Status History    Date Active Date Inactive Code Status Order ID Comments User Context   02/05/2016 12:28 AM 02/09/2016 10:21 PM Full Code [id number]  [name],  MD Inpatient   09/13/2015  3:45 PM 09/17/2015  8:36 PM Full Code [id number]  [name], PA-C ED   04/05/2014  6:19 PM 04/07/2014  3:46 PM Full Code [id number]  [name], MD Inpatient   10/16/2013  4:35 PM 10/20/2013  7:17 PM Full Code [id number]  [name], MD Inpatient   05/16/2013 12:05 AM 05/18/2013  8:59 PM Full Code [id number]  [name], MD Inpatient        IV Access:    Peripheral IV   Procedures and diagnostic studies:   No results found.   Medical Consultants:    Palliative care  Anti-Infectives:   Rocephin  Subjective:    Patient remains confused this morning. No complaints to me.  Objective:    Vitals:   02/27/16 0650 02/27/16 0829 02/27/16 1222 02/27/16 1300  BP: (!) 166/86 (!) 188/98 (!) 189/115 (!) 181/67  Pulse:  87  80  Resp:      Temp:      TempSrc:      SpO2:      Weight:      Height:        Intake/Output Summary (Last 24 hours) at 02/27/16 1443 Last data filed at 02/27/16 1321  Gross per 24 hour  Intake          1663.42 ml  Output             1075 ml  Net           588.42 ml   Filed Weights   02/18/16 1250  Weight: 85.3 kg (188 lb)    Exam: General exam: In no acute distress, alert, not oriented, Lying in bed Respiratory system: Good air movement and clear to auscultation, no wheezing normal respiratory effort Cardiovascular system: RRR. S1-S2.  Gastrointestinal system: Abdomen is nondistended, soft  Extremities: No pedal edema, perfused. Skin: No rashes, lesions or ulcers, no ecchymosis  Data Reviewed:    Labs: Basic Metabolic Panel:  Recent Labs Lab 02/22/16 0514 02/23/16 1035 02/24/16 0605 02/25/16 0510 02/26/16 0405 02/27/16 0520  NA 144 146* 148* 153* 151* 151*  K 3.1* 3.5 3.1* 3.6 3.2* 3.5  CL 111 112* 112* 120* 120* 119*  CO2 26 26 26 26 23 24   GLUCOSE 169* 119* 210* 144* 130* 100*  BUN 13 15 18  22* 25* 24*  CREATININE 0.87 0.87 0.91 0.91 0.99 0.88  CALCIUM 8.6* 9.1 9.3 9.5 9.3 9.1  MG 1.8  --   --   --   --   --    GFR Estimated Creatinine Clearance: 55.9 mL/min (by C-G formula based on SCr of 0.88 mg/dL). Liver Function Tests: No results for input(s): AST, ALT, ALKPHOS, BILITOT, PROT, ALBUMIN in the last 168 hours. No results for  input(s): LIPASE, AMYLASE in the last 168 hours. No results for input(s): AMMONIA in the last 168 hours. Coagulation profile No results for input(s): INR, PROTIME in the last 168 hours.  CBC:  Recent Labs Lab 02/21/16 0620 02/22/16 0514 02/24/16 0605  WBC 17.1* 15.4* 27.3*  HGB 9.9* 10.2* 10.3*  HCT 31.7* 31.8* 32.2*  MCV 87.8 85.7 85.9  PLT 295 278 341   Cardiac Enzymes: No results for input(s): CKTOTAL, CKMB, CKMBINDEX, TROPONINI in the last 168 hours. BNP (last 3 results) No results for input(s): PROBNP in the last 8760 hours. CBG:  Recent Labs Lab 02/26/16 1111 02/26/16 1738 02/26/16 2104 02/27/16 0724 02/27/16 1136  GLUCAP 143* 122* 125* 89  111*   D-Dimer: No results for input(s): DDIMER in the last 72 hours. Hgb A1c: No results for input(s): HGBA1C in the last 72 hours. Lipid Profile: No results for input(s): CHOL, HDL, LDLCALC, TRIG, CHOLHDL, LDLDIRECT in the last 72 hours. Thyroid function studies: No results for input(s): TSH, T4TOTAL, T3FREE, THYROIDAB in the last 72 hours.  Invalid input(s): FREET3 Anemia work up: No results for input(s): VITAMINB12, FOLATE, FERRITIN, TIBC, IRON, RETICCTPCT in the last 72 hours. Sepsis Labs:  Recent Labs Lab 02/21/16 0620 02/22/16 0514 02/24/16 0605  WBC 17.1* 15.4* 27.3*   Microbiology Recent Results (from the past 240 hour(s))  Urine culture     Status: Abnormal   Collection Time: 02/18/16  3:35 PM  Result Value Ref Range Status   Specimen Description URINE, CLEAN CATCH  Final   Special Requests NONE  Final   Culture >=100,000 COLONIES/mL ESCHERICHIA COLI (A)  Final   Report Status 02/20/2016 FINAL  Final   Organism ID, Bacteria ESCHERICHIA COLI (A)  Final      Susceptibility   Escherichia coli - MIC*    AMPICILLIN <=2 SENSITIVE Sensitive     CEFAZOLIN <=4 SENSITIVE Sensitive     CEFTRIAXONE <=1 SENSITIVE Sensitive     CIPROFLOXACIN <=0.25 SENSITIVE Sensitive     GENTAMICIN <=1 SENSITIVE Sensitive      IMIPENEM <=0.25 SENSITIVE Sensitive     NITROFURANTOIN <=16 SENSITIVE Sensitive     TRIMETH/SULFA <=20 SENSITIVE Sensitive     AMPICILLIN/SULBACTAM <=2 SENSITIVE Sensitive     PIP/TAZO <=4 SENSITIVE Sensitive     Extended ESBL NEGATIVE Sensitive     * >=100,000 COLONIES/mL ESCHERICHIA COLI  Blood culture (routine x 2)     Status: Abnormal   Collection Time: 02/18/16  4:20 PM  Result Value Ref Range Status   Specimen Description BLOOD RIGHT ANTECUBITAL  Final   Special Requests BOTTLES DRAWN AEROBIC ONLY 5CC  Final   Culture  Setup Time   Final    GRAM NEGATIVE RODS AEROBIC BOTTLE ONLY CRITICAL RESULT CALLED TO, READ BACK BY AND VERIFIED WITH: C. SUMME, PHARMD ([facility]) AT 1930 ON 02/19/16 BY C. JESSUP, MLT. Performed at [place] (A)  Final   Report Status 02/21/2016 FINAL  Final   Organism ID, Bacteria ESCHERICHIA COLI  Final      Susceptibility   Escherichia coli - MIC*    AMPICILLIN <=2 SENSITIVE Sensitive     CEFAZOLIN <=4 SENSITIVE Sensitive     CEFEPIME <=1 SENSITIVE Sensitive     CEFTAZIDIME <=1 SENSITIVE Sensitive     CEFTRIAXONE <=1 SENSITIVE Sensitive     CIPROFLOXACIN <=0.25 SENSITIVE Sensitive     GENTAMICIN <=1 SENSITIVE Sensitive     IMIPENEM <=0.25 SENSITIVE Sensitive     TRIMETH/SULFA <=20 SENSITIVE Sensitive     AMPICILLIN/SULBACTAM <=2 SENSITIVE Sensitive     PIP/TAZO <=4 SENSITIVE Sensitive     Extended ESBL NEGATIVE Sensitive     * ESCHERICHIA COLI  Blood Culture ID Panel (Reflexed)     Status: Abnormal   Collection Time: 02/18/16  4:20 PM  Result Value Ref Range Status   Enterococcus species NOT DETECTED NOT DETECTED Final   Listeria monocytogenes NOT DETECTED NOT DETECTED Final   Staphylococcus species NOT DETECTED NOT DETECTED Final   Staphylococcus aureus NOT DETECTED NOT DETECTED Final   Streptococcus species NOT DETECTED NOT DETECTED Final   Streptococcus agalactiae NOT DETECTED NOT DETECTED Final   Streptococcus  pneumoniae  NOT DETECTED NOT DETECTED Final   Streptococcus pyogenes NOT DETECTED NOT DETECTED Final   Acinetobacter baumannii NOT DETECTED NOT DETECTED Final   Enterobacteriaceae species DETECTED (A) NOT DETECTED Final    Comment: CRITICAL RESULT CALLED TO, READ BACK BY AND VERIFIED WITH: C. SUMME, PHARMD ([facility]) AT 1930 ON 02/19/16 BY C. JESSUP, MLT.    Enterobacter cloacae complex NOT DETECTED NOT DETECTED Final   Escherichia coli DETECTED (A) NOT DETECTED Final    Comment: CRITICAL RESULT CALLED TO, READ BACK BY AND VERIFIED WITH: C. SUMME, PHARMD ([facility]) AT 1930 ON 02/19/16 BY C. JESSUP, MLT.    Klebsiella oxytoca NOT DETECTED NOT DETECTED Final   Klebsiella pneumoniae NOT DETECTED NOT DETECTED Final   Proteus species NOT DETECTED NOT DETECTED Final   Serratia marcescens NOT DETECTED NOT DETECTED Final   Carbapenem resistance NOT DETECTED NOT DETECTED Final   Haemophilus influenzae NOT DETECTED NOT DETECTED Final   Neisseria meningitidis NOT DETECTED NOT DETECTED Final   Pseudomonas aeruginosa NOT DETECTED NOT DETECTED Final   Candida albicans NOT DETECTED NOT DETECTED Final   Candida glabrata NOT DETECTED NOT DETECTED Final   Candida krusei NOT DETECTED NOT DETECTED Final   Candida parapsilosis NOT DETECTED NOT DETECTED Final   Candida tropicalis NOT DETECTED NOT DETECTED Final    Comment: Performed at [facility]  Blood culture (routine x 2)     Status: Abnormal   Collection Time: 02/18/16  5:00 PM  Result Value Ref Range Status   Specimen Description BLOOD LEFT ANTECUBITAL  Final   Special Requests IN PEDIATRIC BOTTLE 2 CC  Final   Culture  Setup Time   Final    GRAM POSITIVE COCCI IN CLUSTERS IN PEDIATRIC BOTTLE CRITICAL RESULT CALLED TO, READ BACK BY AND VERIFIED WITH: T. Pickering Pharm.D. 9:55 02/20/16 ([email or web address])    Culture (A)  Final    STAPHYLOCOCCUS SPECIES (COAGULASE NEGATIVE) THE SIGNIFICANCE OF ISOLATING THIS ORGANISM FROM A SINGLE SET OF BLOOD CULTURES WHEN  MULTIPLE SETS ARE DRAWN IS UNCERTAIN. PLEASE NOTIFY THE MICROBIOLOGY DEPARTMENT WITHIN ONE WEEK IF SPECIATION AND SENSITIVITIES ARE REQUIRED. Performed at [facility]    Report Status 02/21/2016 FINAL  Final  Blood Culture ID Panel (Reflexed)     Status: Abnormal   Collection Time: 02/18/16  5:00 PM  Result Value Ref Range Status   Enterococcus species NOT DETECTED NOT DETECTED Final   Listeria monocytogenes NOT DETECTED NOT DETECTED Final   Staphylococcus species DETECTED (A) NOT DETECTED Final    Comment: CRITICAL RESULT CALLED TO, READ BACK BY AND VERIFIED WITH: T. Pickering Pharm.D. 9:55 02/20/16 ([email or web address])    Staphylococcus aureus NOT DETECTED NOT DETECTED Final   Methicillin resistance NOT DETECTED NOT DETECTED Final   Streptococcus species NOT DETECTED NOT DETECTED Final   Streptococcus agalactiae NOT DETECTED NOT DETECTED Final   Streptococcus pneumoniae NOT DETECTED NOT DETECTED Final   Streptococcus pyogenes NOT DETECTED NOT DETECTED Final   Acinetobacter baumannii NOT DETECTED NOT DETECTED Final   Enterobacteriaceae species NOT DETECTED NOT DETECTED Final   Enterobacter cloacae complex NOT DETECTED NOT DETECTED Final   Escherichia coli NOT DETECTED NOT DETECTED Final   Klebsiella oxytoca NOT DETECTED NOT DETECTED Final   Klebsiella pneumoniae NOT DETECTED NOT DETECTED Final   Proteus species NOT DETECTED NOT DETECTED Final   Serratia marcescens NOT DETECTED NOT DETECTED Final   Haemophilus influenzae NOT DETECTED NOT DETECTED Final   Neisseria meningitidis NOT DETECTED NOT DETECTED Final   Pseudomonas aeruginosa NOT  DETECTED NOT DETECTED Final   Candida albicans NOT DETECTED NOT DETECTED Final   Candida glabrata NOT DETECTED NOT DETECTED Final   Candida krusei NOT DETECTED NOT DETECTED Final   Candida parapsilosis NOT DETECTED NOT DETECTED Final   Candida tropicalis NOT DETECTED NOT DETECTED Final    Comment: Performed at [facility]  Culture, blood  (Routine X 2) w Reflex to ID Panel     Status: None   Collection Time: 02/20/16 11:47 AM  Result Value Ref Range Status   Specimen Description BLOOD RIGHT ARM  Final   Special Requests BOTTLES DRAWN AEROBIC AND ANAEROBIC 10CC  Final   Culture   Final    NO GROWTH 5 DAYS Performed at [facility]    Report Status 02/25/2016 FINAL  Final  Culture, blood (Routine X 2) w Reflex to ID Panel     Status: None   Collection Time: 02/20/16 11:48 AM  Result Value Ref Range Status   Specimen Description BLOOD RIGHT HAND  Final   Special Requests IN PEDIATRIC BOTTLE [place]  Final   Culture   Final    NO GROWTH 5 DAYS Performed at [facility]    Report Status 02/25/2016 FINAL  Final     Medications:   . aspirin  300 mg Rectal Daily  . cefTRIAXone (ROCEPHIN)  IV  2 g Intravenous Q24H  . [START ON 02/28/2016] cloNIDine  0.3 mg Transdermal Weekly  . enalaprilat  0.625 mg Intravenous Q6H  . enoxaparin (LOVENOX) injection  40 mg Subcutaneous QHS  . feeding supplement (GLUCERNA SHAKE)  237 mL Oral Q24H  . hydrALAZINE  20 mg Intravenous Q6H  . insulin aspart  0-5 Units Subcutaneous QHS  . insulin aspart  0-9 Units Subcutaneous TID WC  . insulin glargine  10 Units Subcutaneous QHS  . nystatin  1 g Topical QID  . sodium chloride flush  10-40 mL Intracatheter Q12H   Continuous Infusions: . sodium chloride 100 mL/hr at 02/27/16 1017      LOS: 8 days   King Pinzon K  [facility] Pager [phone]  *Please refer to [email or web address], password [email or web address] to get updated schedule on who will round on this patient, as hospitalists switch teams weekly. If 7PM-7AM, please contact night-coverage at [email or web address], password [email or web address] for any overnight needs.  02/27/2016, 2:43 PM

## 2016-02-27 NOTE — Telephone Encounter (Signed)
Paperwork signed, faxed, copy sent to scan 

## 2016-02-27 NOTE — Progress Notes (Signed)
Nutrition Follow-up  DOCUMENTATION CODES:   Severe malnutrition in context of acute illness/injury, Obesity unspecified  INTERVENTION:  Continue Glucerna Shake po once daily, each supplement provides 220 kcal and 10 grams of protein.  Although patient has been 7 days without adequate nutrition, do not recommend more aggressive nutrition intervention of tube feeding in setting of advanced disease (met renal cell carcinoma with lung mets) and advanced dementia + encephalopathy.   Will continue to monitor intake and goals of care discussions with family.  NUTRITION DIAGNOSIS:   Increased nutrient needs related to wound healing as evidenced by estimated needs.  Ongoing.  GOAL:   Patient will meet greater than or equal to 90% of their needs  Not met.  MONITOR:   PO intake, Supplement acceptance, Labs, Weight trends, Skin, I & O's  REASON FOR ASSESSMENT:   Consult Assessment of nutrition requirement/status  ASSESSMENT:   82 y.o. female past medical history significant for metastatic renal cell carcinoma on comfort care with advanced dementia with recurrent UTIs comes in for lethargy.  Patient sleeping upon assessment and did not awake to name call x 5. No family present. Discussed plan with RN. Pt remains with very poor intake. There was a family meeting yesterday but no decisions have been made.   Diet changed to Dysphagia 1 on 9/24 per SLP recommendations. Per chart, pt was ready for d/c to SNF on 9/25 but Na level rising, correcting prior to d/c. Prognosis remains poor. Patient refusing medications. Palliative care has been involved, but pt remains full code.   Meal Completion: 0-10% per chart  Medications reviewed and include: Novolog sliding scale TID with meals, Lantus 10 units daily, 1/2NS @ 100 ml/hr.  Labs reviewed: CBG 89-143 past 24 hrs, Sodium 151, Chloride 119.   Diet Order:  Diet - low sodium heart healthy DIET - DYS 1 Room service appropriate? Yes; Fluid  consistency: Thin  Skin:  Wound (see comment) (sacral pressure injury)  Last BM:  02/25/2016  Height:   Ht Readings from Last 1 Encounters:  02/18/16 _0  (1.651 m)    Weight:   Wt Readings from Last 1 Encounters:  02/18/16 188 lb (85.3 kg)    Ideal Body Weight:  56.8 kg  BMI:  Body mass index is 31.28 kg/m.  Estimated Nutritional Needs:   Kcal:  1550-1750  Protein:  75-85g  Fluid:  1.7L/day  EDUCATION NEEDS:   No education needs identified at this time  Willey Blade, MS, RD, LDN Pager: [phone] After Hours Pager: [phone]

## 2016-02-28 ENCOUNTER — Encounter (HOSPITAL_COMMUNITY): Payer: Self-pay | Admitting: Student

## 2016-02-28 DIAGNOSIS — R7881 Bacteremia: Secondary | ICD-10-CM

## 2016-02-28 DIAGNOSIS — I10 Essential (primary) hypertension: Secondary | ICD-10-CM

## 2016-02-28 LAB — BASIC METABOLIC PANEL
ANION GAP: 7 (ref 5–15)
BUN: 21 mg/dL — ABNORMAL HIGH (ref 6–20)
CHLORIDE: 117 mmol/L — AB (ref 101–111)
CO2: 25 mmol/L (ref 22–32)
Calcium: 9 mg/dL (ref 8.9–10.3)
Creatinine, Ser: 0.73 mg/dL (ref 0.44–1.00)
GFR calc non Af Amer: >60 mL/min (ref >60)
GLUCOSE: 123 mg/dL — AB (ref 65–99)
Potassium: 3.6 mmol/L (ref 3.5–5.1)
Sodium: 149 mmol/L — ABNORMAL HIGH (ref 135–145)

## 2016-02-28 LAB — GLUCOSE, CAPILLARY
GLUCOSE-CAPILLARY: 159 mg/dL — AB (ref 65–99)
GLUCOSE-CAPILLARY: 126 mg/dL — AB (ref 65–99)
GLUCOSE-CAPILLARY: 157 mg/dL — AB (ref 65–99)
GLUCOSE-CAPILLARY: 163 mg/dL — AB (ref 65–99)
Glucose-Capillary: 130 mg/dL — ABNORMAL HIGH (ref 65–99)

## 2016-02-28 MED ORDER — ENALAPRILAT 1.25 MG/ML IV SOLN
1.2500 mg | Freq: Four times a day (QID) | INTRAVENOUS | Status: DC
  Administered 2016-02-28 – 2016-03-03 (×10): 1.25 mg via INTRAVENOUS
  Filled 2016-02-28 (×19): qty 1

## 2016-02-28 MED ORDER — LIP MEDEX EX OINT
TOPICAL_OINTMENT | CUTANEOUS | Status: AC
  Administered 2016-02-28: 21:00:00
  Filled 2016-02-28: qty 7

## 2016-02-28 NOTE — Progress Notes (Signed)
LCSWA met with patient daughters, they are agreeable to meet with [place] tomorrow at 11:00am.  LCSWA confirmed appointment with [facility].

## 2016-02-28 NOTE — Progress Notes (Signed)
[facility] PROGRESS NOTE    Progress Note  AMAANI Dawson  [id number] DOB: [birth year] DOA: 02/18/2016 PCP: Cathlean Cower, MD   Brief Narrative:   Katie Dawson is an 82 y.o. female past medical history significant for metastatic renal cell carcinoma with advanced dementia and recurrent UTIs comes in for lethargy. She is being treated for UTI with bacteremia and has been refusing oral medications. PICC line was placed 9/23 in anticipation of SNF discharge on IV medications as family wants to keep patient full code and under full treatment. Ready for d/c to SNF 9/25 but Na level has been rising and we need to correct it prior to discharge. Today I had a chance to talk to daughter Katie Dawson face to face and we had a long conversation about her mother's situation. I am concerned that we may be forced to do more harm than good if we are not realistic about her illness and prognosis. She was seen by Palliative care earlier in her stay, but today I've spoken with Dr. Domingo Dawson who will talk with family again.  Assessment/Plan:   Acute kidney injury: Likely related to UTI and probably mild dehydration. Suspect prerenal in etiology Patient Continued on IV fluids. Continue IV fluids Renal function stable thus far.  Hypernatremia Suspect secondary to decreased by mouth intake. Sodium had peaked to 153 Patient is continued on normal saline. Sodium has improved to 149 this morning.  We'll repeat basic metabolic panel in morning   Encephalopathy superimposed on advanced dementia in the setting of a UTI with ecoli bacteremia: She has baseline dementia with likely hospital delirium and infectious encephalopathy worsening the picture. Patient continues to refuse to take medications  UTI with e coli bacteremia: Patient is continued on IV Rocephin Afebrile Most recent white blood count of 27.3 thousand which is elevated. Anticipate patient would require 14 days of antibiotics for her  bacteremia.  Hypotension/essential hypertension: Currently resolved with holding antihypertensive medication, but now hypertensive as she's been refusing PO medications thus far Clonidine patch dose was recently increased, hydralazine scheduled.  Continue PRN IV labetalol This morning, patient's blood pressure remains elevated will increase dose of enalaprilat  History of metastatic renal cell carcinoma with lung metastases: Appreciate input by palliative care. There seems to be an element of denial regarding patient's long-term prognosis. Discussed case with palliative care service. Appreciate their assistance. After family meeting this morning, namely has agreed to DO NOT RESUSCITATE status per patient. Discussed case with social worker who states [place] place to speak with family tomorrow  Diabetes mellitus type 2: Continue holding glipizide continue sliding scale insulin. Blood glucose is well controlled.  Hypokalemia/hypomagnesium -Replaced  DVT prophylaxis: lovenox Family Communication: Patient in room, family at bedside  Disposition Plan/Barrier to D/C: Uncertain at this time   Code Status:     Code Status Orders        Start     Ordered   02/18/16 2322  Full code  Continuous     02/18/16 2321    Code Status History    Date Active Date Inactive Code Status Order ID Comments User Context   02/05/2016 12:28 AM 02/09/2016 10:21 PM Full Code [id number]  [name], MD Inpatient   09/13/2015  3:45 PM 09/17/2015  8:36 PM Full Code [id number]  [name], PA-C ED   04/05/2014  6:19 PM 04/07/2014  3:46 PM Full Code [id number]  [name], MD Inpatient   10/16/2013  4:35 PM 10/20/2013  7:17  PM Full Code [id number]  [name], MD Inpatient   05/16/2013 12:05 AM 05/18/2013  8:59 PM Full Code [id number]  [name], MD Inpatient        IV Access:    Peripheral IV   Procedures and diagnostic studies:   No results found.   Medical Consultants:     Palliative care  Anti-Infectives:   Rocephin  Subjective:    Patient remains confused however she reports no complaints.  Objective:    Vitals:   02/27/16 2057 02/28/16 0100 02/28/16 0644 02/28/16 1533  BP: (!) 183/68 (!) 180/72 (!) 188/67 (!) 179/63  Pulse: 87 80 84 89  Resp: 18  18   Temp: 98.7 F (37.1 C)  99.1 F (37.3 C)   TempSrc: Oral  Oral   SpO2: 99%  95%   Weight:      Height:        Intake/Output Summary (Last 24 hours) at 02/28/16 1605 Last data filed at 02/28/16 1103  Gross per 24 hour  Intake                0 ml  Output             1400 ml  Net            -1400 ml   Filed Weights   02/18/16 1250  Weight: 85.3 kg (188 lb)    Exam: General exam: Asleep initially, easily arousable, no acute distress Respiratory system: Normal respiratory effort, no end expiratory wheezing Cardiovascular system: Regular rate, no murmurs Gastrointestinal system: Obese, soft, nondistended Extremities: No clubbing or cyanosis Skin: No lacerations, rashes normal skin turgor  Data Reviewed:    Labs: Basic Metabolic Panel:  Recent Labs Lab 02/22/16 0514  02/24/16 0605 02/25/16 0510 02/26/16 0405 02/27/16 0520 02/27/16 1615 02/28/16 0435  NA 144  < > 148* 153* 151* 151* 152* 149*  K 3.1*  < > 3.1* 3.6 3.2* 3.5  --  3.6  CL 111  < > 112* 120* 120* 119*  --  117*  CO2 26  < > 26 26 23 24   --  25  GLUCOSE 169*  < > 210* 144* 130* 100*  --  123*  BUN 13  < > 18 22* 25* 24*  --  21*  CREATININE 0.87  < > 0.91 0.91 0.99 0.88  --  0.73  CALCIUM 8.6*  < > 9.3 9.5 9.3 9.1  --  9.0  MG 1.8  --   --   --   --   --   --   --   < > = values in this interval not displayed. GFR Estimated Creatinine Clearance: 61.5 mL/min (by C-G formula based on SCr of 0.73 mg/dL). Liver Function Tests: No results for input(s): AST, ALT, ALKPHOS, BILITOT, PROT, ALBUMIN in the last 168 hours. No results for input(s): LIPASE, AMYLASE in the last 168 hours. No results for input(s):  AMMONIA in the last 168 hours. Coagulation profile No results for input(s): INR, PROTIME in the last 168 hours.  CBC:  Recent Labs Lab 02/22/16 0514 02/24/16 0605  WBC 15.4* 27.3*  HGB 10.2* 10.3*  HCT 31.8* 32.2*  MCV 85.7 85.9  PLT 278 341   Cardiac Enzymes: No results for input(s): CKTOTAL, CKMB, CKMBINDEX, TROPONINI in the last 168 hours. BNP (last 3 results) No results for input(s): PROBNP in the last 8760 hours. CBG:  Recent Labs Lab 02/27/16 1136 02/27/16 1744 02/27/16 2055 02/28/16  [id number] 02/28/16 1204  GLUCAP 111* 103* 92 130* 159*   D-Dimer: No results for input(s): DDIMER in the last 72 hours. Hgb A1c: No results for input(s): HGBA1C in the last 72 hours. Lipid Profile: No results for input(s): CHOL, HDL, LDLCALC, TRIG, CHOLHDL, LDLDIRECT in the last 72 hours. Thyroid function studies: No results for input(s): TSH, T4TOTAL, T3FREE, THYROIDAB in the last 72 hours.  Invalid input(s): FREET3 Anemia work up: No results for input(s): VITAMINB12, FOLATE, FERRITIN, TIBC, IRON, RETICCTPCT in the last 72 hours. Sepsis Labs:  Recent Labs Lab 02/22/16 0514 02/24/16 0605  WBC 15.4* 27.3*   Microbiology Recent Results (from the past 240 hour(s))  Blood culture (routine x 2)     Status: Abnormal   Collection Time: 02/18/16  4:20 PM  Result Value Ref Range Status   Specimen Description BLOOD RIGHT ANTECUBITAL  Final   Special Requests BOTTLES DRAWN AEROBIC ONLY 5CC  Final   Culture  Setup Time   Final    GRAM NEGATIVE RODS AEROBIC BOTTLE ONLY CRITICAL RESULT CALLED TO, READ BACK BY AND VERIFIED WITH: C. SUMME, PHARMD ([place]) AT 1930 ON 02/19/16 BY C. JESSUP, MLT. Performed at [place] (A)  Final   Report Status 02/21/2016 FINAL  Final   Organism ID, Bacteria ESCHERICHIA COLI  Final      Susceptibility   Escherichia coli - MIC*    AMPICILLIN <=2 SENSITIVE Sensitive     CEFAZOLIN <=4 SENSITIVE Sensitive     CEFEPIME <=1  SENSITIVE Sensitive     CEFTAZIDIME <=1 SENSITIVE Sensitive     CEFTRIAXONE <=1 SENSITIVE Sensitive     CIPROFLOXACIN <=0.25 SENSITIVE Sensitive     GENTAMICIN <=1 SENSITIVE Sensitive     IMIPENEM <=0.25 SENSITIVE Sensitive     TRIMETH/SULFA <=20 SENSITIVE Sensitive     AMPICILLIN/SULBACTAM <=2 SENSITIVE Sensitive     PIP/TAZO <=4 SENSITIVE Sensitive     Extended ESBL NEGATIVE Sensitive     * ESCHERICHIA COLI  Blood Culture ID Panel (Reflexed)     Status: Abnormal   Collection Time: 02/18/16  4:20 PM  Result Value Ref Range Status   Enterococcus species NOT DETECTED NOT DETECTED Final   Listeria monocytogenes NOT DETECTED NOT DETECTED Final   Staphylococcus species NOT DETECTED NOT DETECTED Final   Staphylococcus aureus NOT DETECTED NOT DETECTED Final   Streptococcus species NOT DETECTED NOT DETECTED Final   Streptococcus agalactiae NOT DETECTED NOT DETECTED Final   Streptococcus pneumoniae NOT DETECTED NOT DETECTED Final   Streptococcus pyogenes NOT DETECTED NOT DETECTED Final   Acinetobacter baumannii NOT DETECTED NOT DETECTED Final   Enterobacteriaceae species DETECTED (A) NOT DETECTED Final    Comment: CRITICAL RESULT CALLED TO, READ BACK BY AND VERIFIED WITH: C. SUMME, PHARMD ([place]) AT 1930 ON 02/19/16 BY C. JESSUP, MLT.    Enterobacter cloacae complex NOT DETECTED NOT DETECTED Final   Escherichia coli DETECTED (A) NOT DETECTED Final    Comment: CRITICAL RESULT CALLED TO, READ BACK BY AND VERIFIED WITH: C. SUMME, PHARMD ([facility]) AT 1930 ON 02/19/16 BY C. JESSUP, MLT.    Klebsiella oxytoca NOT DETECTED NOT DETECTED Final   Klebsiella pneumoniae NOT DETECTED NOT DETECTED Final   Proteus species NOT DETECTED NOT DETECTED Final   Serratia marcescens NOT DETECTED NOT DETECTED Final   Carbapenem resistance NOT DETECTED NOT DETECTED Final   Haemophilus influenzae NOT DETECTED NOT DETECTED Final   Neisseria meningitidis NOT DETECTED NOT DETECTED Final  Pseudomonas aeruginosa NOT DETECTED  NOT DETECTED Final   Candida albicans NOT DETECTED NOT DETECTED Final   Candida glabrata NOT DETECTED NOT DETECTED Final   Candida krusei NOT DETECTED NOT DETECTED Final   Candida parapsilosis NOT DETECTED NOT DETECTED Final   Candida tropicalis NOT DETECTED NOT DETECTED Final    Comment: Performed at [facility]  Blood culture (routine x 2)     Status: Abnormal   Collection Time: 02/18/16  5:00 PM  Result Value Ref Range Status   Specimen Description BLOOD LEFT ANTECUBITAL  Final   Special Requests IN PEDIATRIC BOTTLE 2 CC  Final   Culture  Setup Time   Final    GRAM POSITIVE COCCI IN CLUSTERS IN PEDIATRIC BOTTLE CRITICAL RESULT CALLED TO, READ BACK BY AND VERIFIED WITH: T. Pickering Pharm.D. 9:55 02/20/16 ([email or web address])    Culture (A)  Final    STAPHYLOCOCCUS SPECIES (COAGULASE NEGATIVE) THE SIGNIFICANCE OF ISOLATING THIS ORGANISM FROM A SINGLE SET OF BLOOD CULTURES WHEN MULTIPLE SETS ARE DRAWN IS UNCERTAIN. PLEASE NOTIFY THE MICROBIOLOGY DEPARTMENT WITHIN ONE WEEK IF SPECIATION AND SENSITIVITIES ARE REQUIRED. Performed at [facility]    Report Status 02/21/2016 FINAL  Final  Blood Culture ID Panel (Reflexed)     Status: Abnormal   Collection Time: 02/18/16  5:00 PM  Result Value Ref Range Status   Enterococcus species NOT DETECTED NOT DETECTED Final   Listeria monocytogenes NOT DETECTED NOT DETECTED Final   Staphylococcus species DETECTED (A) NOT DETECTED Final    Comment: CRITICAL RESULT CALLED TO, READ BACK BY AND VERIFIED WITH: T. Pickering Pharm.D. 9:55 02/20/16 ([email or web address])    Staphylococcus aureus NOT DETECTED NOT DETECTED Final   Methicillin resistance NOT DETECTED NOT DETECTED Final   Streptococcus species NOT DETECTED NOT DETECTED Final   Streptococcus agalactiae NOT DETECTED NOT DETECTED Final   Streptococcus pneumoniae NOT DETECTED NOT DETECTED Final   Streptococcus pyogenes NOT DETECTED NOT DETECTED Final   Acinetobacter baumannii NOT DETECTED NOT DETECTED  Final   Enterobacteriaceae species NOT DETECTED NOT DETECTED Final   Enterobacter cloacae complex NOT DETECTED NOT DETECTED Final   Escherichia coli NOT DETECTED NOT DETECTED Final   Klebsiella oxytoca NOT DETECTED NOT DETECTED Final   Klebsiella pneumoniae NOT DETECTED NOT DETECTED Final   Proteus species NOT DETECTED NOT DETECTED Final   Serratia marcescens NOT DETECTED NOT DETECTED Final   Haemophilus influenzae NOT DETECTED NOT DETECTED Final   Neisseria meningitidis NOT DETECTED NOT DETECTED Final   Pseudomonas aeruginosa NOT DETECTED NOT DETECTED Final   Candida albicans NOT DETECTED NOT DETECTED Final   Candida glabrata NOT DETECTED NOT DETECTED Final   Candida krusei NOT DETECTED NOT DETECTED Final   Candida parapsilosis NOT DETECTED NOT DETECTED Final   Candida tropicalis NOT DETECTED NOT DETECTED Final    Comment: Performed at [facility]  Culture, blood (Routine X 2) w Reflex to ID Panel     Status: None   Collection Time: 02/20/16 11:47 AM  Result Value Ref Range Status   Specimen Description BLOOD RIGHT ARM  Final   Special Requests BOTTLES DRAWN AEROBIC AND ANAEROBIC 10CC  Final   Culture   Final    NO GROWTH 5 DAYS Performed at [facility]    Report Status 02/25/2016 FINAL  Final  Culture, blood (Routine X 2) w Reflex to ID Panel     Status: None   Collection Time: 02/20/16 11:48 AM  Result Value Ref Range Status   Specimen  Description BLOOD RIGHT HAND  Final   Special Requests IN PEDIATRIC BOTTLE [place]  Final   Culture   Final    NO GROWTH 5 DAYS Performed at [facility]    Report Status 02/25/2016 FINAL  Final     Medications:   . aspirin  300 mg Rectal Daily  . cefTRIAXone (ROCEPHIN)  IV  2 g Intravenous Q24H  . cloNIDine  0.3 mg Transdermal Weekly  . enalaprilat  1.25 mg Intravenous Q6H  . enoxaparin (LOVENOX) injection  40 mg Subcutaneous QHS  . feeding supplement (GLUCERNA SHAKE)  237 mL Oral Q24H  . hydrALAZINE  20 mg  Intravenous Q6H  . insulin aspart  0-5 Units Subcutaneous QHS  . insulin aspart  0-9 Units Subcutaneous TID WC  . insulin glargine  10 Units Subcutaneous QHS  . nystatin  1 g Topical QID  . sodium chloride flush  10-40 mL Intracatheter Q12H   Continuous Infusions: . sodium chloride 100 mL/hr at 02/28/16 0348      LOS: 9 days   CHIU, STEPHEN K  [facility] Pager [phone]  *Please refer to [email or web address], password [email or web address] to get updated schedule on who will round on this patient, as hospitalists switch teams weekly. If 7PM-7AM, please contact night-coverage at [email or web address], password [email or web address] for any overnight needs.  02/28/2016, 4:05 PM

## 2016-02-28 NOTE — Progress Notes (Signed)
Daily Progress Note   Patient Name: Katie Dawson       Date: 02/28/2016 DOB: 23-Aug-1933  Age: 82 y.o. MRN#: [id number] Attending Physician: Donne Hazel, MD Primary Care Physician: Cathlean Cower, MD Admit Date: 02/18/2016  Reason for Consultation/Follow-up: Establishing goals of care  Subjective: Met this morning with patient's family including her 2 daughters, her sons-in-law, and 2 grandchildren.  Continued discussion about the decline that she has shown during this hospitalization and the fact that she remains unable to maintain her own nutrition and hydration orally. We talked about recommendation against tube feeding and pathways forward including continue with current care versus refocusing care with a comfort based approach.  We discussed CODE STATUS and the fact that increasing the aggressiveness of the care that she receives, particularly in the event of cardiac or respiratory arrest, would not be a pathway towards her every get well enough to leave the hospital.  Family has had time to process and transitioned to DNR today.  We discussed pathways forward at length including potential for residential hospice. Family is open to meeting with representative to discuss, but report that they do not want to commit prior to talking to [facility] about residential hospice.  Length of Stay: 9  Current Medications: Scheduled Meds:  . aspirin  300 mg Rectal Daily  . cefTRIAXone (ROCEPHIN)  IV  2 g Intravenous Q24H  . cloNIDine  0.3 mg Transdermal Weekly  . enalaprilat  1.25 mg Intravenous Q6H  . enoxaparin (LOVENOX) injection  40 mg Subcutaneous QHS  . feeding supplement (GLUCERNA SHAKE)  237 mL Oral Q24H  . hydrALAZINE  20 mg Intravenous Q6H  . insulin aspart  0-5 Units Subcutaneous QHS  .  insulin aspart  0-9 Units Subcutaneous TID WC  . insulin glargine  10 Units Subcutaneous QHS  . nystatin  1 g Topical QID  . sodium chloride flush  10-40 mL Intracatheter Q12H    Continuous Infusions: . sodium chloride 100 mL/hr at 02/28/16 0348    PRN Meds: acetaminophen (TYLENOL) oral liquid 160 mg/5 mL, labetalol, lip balm, ondansetron **OR** ondansetron (ZOFRAN) IV, sodium chloride flush  Physical Exam    General exam: In no acute distress, alert, not oriented Respiratory system: Good air movement and clear to auscultation. Cardiovascular system: S1 & S2 heard, RRR. No JVD,  murmurs, rubs, gallops or clicks.  Gastrointestinal system: Abdomen is nondistended, soft and nontender.  Extremities: Some pedal edema. Skin: No rashes, lesions or ulcers       Vital Signs: BP (!) 179/63 (BP Location: Left Arm)   Pulse 89   Temp 99.1 F (37.3 C) (Oral)   Resp 18   Ht '5\' 5"'  (1.651 m)   Wt 85.3 kg (188 lb)   SpO2 95%   BMI 31.28 kg/m  SpO2: SpO2: 95 % O2 Device: O2 Device: Not Delivered O2 Flow Rate:    Intake/output summary:   Intake/Output Summary (Last 24 hours) at 02/28/16 1611 Last data filed at 02/28/16 1103  Gross per 24 hour  Intake                0 ml  Output             1400 ml  Net            -1400 ml   LBM: Last BM Date:  (PTA) Baseline Weight: Weight: 85.3 kg (188 lb) Most recent weight: Weight: 85.3 kg (188 lb)       Palliative Assessment/Data:    Flowsheet Rows   Flowsheet Row Most Recent Value  Intake Tab  Referral Department  Hospitalist  Unit at Time of Referral  Med/Surg Unit  Palliative Care Primary Diagnosis  Cancer  Date Notified  02/18/16  Palliative Care Type  New Palliative care  Reason for referral  Clarify Goals of Care  Date of Admission  02/18/16  Date first seen by Palliative Care  02/19/16  # of days Palliative referral response time  1 Day(s)  # of days IP prior to Palliative referral  0  Clinical Assessment  Palliative  Performance Scale Score  30%  Pain Max last 24 hours  Not able to report  Pain Min Last 24 hours  Not able to report  Dyspnea Max Last 24 Hours  Not able to report  Dyspnea Min Last 24 hours  Not able to report  Psychosocial & Spiritual Assessment  Palliative Care Outcomes  Patient/Family meeting held?  Yes  Who was at the meeting?  daughter Becky Sax  Palliative Care Outcomes  Provided end of life care assistance, Provided advance care planning, Provided psychosocial or spiritual support, Counseled regarding hospice  Palliative Care follow-up planned  -- [follow up while at [facility]]      Patient Active Problem List   Diagnosis Date Noted  . Acute-on-chronic kidney injury ([place])   . Protein-calorie malnutrition, severe 02/21/2016  . Hypokalemia 02/21/2016  . Bacteremia   . Palliative care encounter   . DNR (do not resuscitate) discussion   . Goals of care, counseling/discussion   . DM type 2 causing CKD stage 3 ([place]) 02/18/2016  . Hypotension 02/18/2016  . Metastatic cancer to lung ([place]) 02/08/2016  . Acute encephalopathy   . AKI (acute kidney injury) ([place])   . External hemorrhoid, thrombosed 01/04/2016  . Hematochezia 01/04/2016  . Syncope 10/07/2015  . Lethargy 09/13/2015  . Dizziness 04/05/2014  . Abnormal urine odor 02/07/2014  . Complicated UTI (urinary tract infection) 10/17/2013  . Hypoglycemia 10/16/2013  . Heart murmur 09/16/2013  . Agitation 09/16/2013  . Back pain 05/18/2013  . Altered mental state 05/15/2013  . Lower extremity weakness 05/15/2013  . ARF (acute renal failure) ([place]) 05/15/2013  . Leucocytosis 05/15/2013  . Peripheral neuropathy ([place]) 05/15/2013  . Aortic stenosis 02/10/2013  . Dementia 02/10/2013  . Vitamin D deficiency 04/10/2012  .  Depression 04/10/2012  . Confusion 04/08/2012  . Closed fibular fracture 12/24/2011  . Wrist fracture, closed 12/24/2011  . Fatigue 11/05/2010  . Preventative health care 11/03/2010  . INTERMITTENT VERTIGO 04/20/2009    . OSTEOPENIA 02/12/2009  . FOOT PAIN, LEFT 12/23/2007  . Insulin dependent diabetes mellitus ([place]) 04/22/2007  . HLD (hyperlipidemia) 04/22/2007  . GOUT 04/22/2007  . ANEMIA-IRON DEFICIENCY 04/22/2007  . ANXIETY 04/22/2007  . Sleep apnea 04/22/2007  . Essential hypertension 04/22/2007  . GERD 04/22/2007  . Disorder resulting from impaired renal function 04/22/2007  . 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 COLONIC POLYPS, HX OF 04/22/2007  . NEPHRECTOMY, HX OF 04/22/2007    Palliative Care Assessment & Plan   Recommendations/Plan: Family has been having difficulty processing the fact that Ms. Sankey is coming to the end of her life.  Today her daughters are much more open to discussion of comfort care and pursuing placement at residential hospice for end-of-life care as they report that she would not want to live her life going back and forth between a hospital and nursing home if she does not have reversible problems from which she can recover.  Family has already met with [facility] to discuss hospice, and will ask to revisit to discuss residential hospice placement.  Family reports needing to speak to [facility] liaison prior to making final decision.  I discussed again with family regarding heroic interventions at the end-of-life and they agree this would not be in line with prior expressed wishes for a natural death or be likely to lead to getting well enough to go back home. They were in agreement with changing CODE STATUS to DO NOT RESUSCITATE.   Code Status:    Code Status Orders        Start     Ordered   02/18/16 2322  Full code  Continuous     02/18/16 2321    Code Status History    Date Active Date Inactive Code Status Order ID Comments User Context   02/05/2016 12:28 AM 02/09/2016 10:21 PM Full Code [id number]  [name], MD Inpatient   09/13/2015  3:45 PM 09/17/2015  8:36 PM Full Code [id number]  [name], PA-C ED   04/05/2014  6:19 PM 04/07/2014  3:46 PM Full  Code [id number]  [name], MD Inpatient   10/16/2013  4:35 PM 10/20/2013  7:17 PM Full Code [id number]  [name], MD Inpatient   05/16/2013 12:05 AM 05/18/2013  8:59 PM Full Code [id number]  [name], MD Inpatient       Prognosis:   Unable to determine, however her prognosis would be less than 2 weeks if focus were to transition to comfort as she Is already hypernatremic and without significant PO intake.  Discharge Planning:  To Be Determined  Care plan was discussed with family, Dr. Wyline Copas  Thank you for allowing the Palliative Medicine Team to assist in the care of this patient.   Time In: 0900 Time Out: 1000 Total Time 60 Prolonged Time Billed No      Greater than 50%  of this time was spent counseling and coordinating care related to the above assessment and plan.  Micheline Rough, MD  Please contact Palliative Medicine Team phone at [phone] for questions and concerns.

## 2016-02-29 ENCOUNTER — Inpatient Hospital Stay (HOSPITAL_COMMUNITY): Payer: Medicare Other

## 2016-02-29 DIAGNOSIS — D72829 Elevated white blood cell count, unspecified: Secondary | ICD-10-CM

## 2016-02-29 DIAGNOSIS — G9340 Encephalopathy, unspecified: Secondary | ICD-10-CM

## 2016-02-29 LAB — CBC
HEMATOCRIT: 27.9 % — AB (ref 36.0–46.0)
Hemoglobin: 8.8 g/dL — ABNORMAL LOW (ref 12.0–15.0)
MCH: 28 pg (ref 26.0–34.0)
MCHC: 31.5 g/dL (ref 30.0–36.0)
MCV: 88.9 fL (ref 78.0–100.0)
PLATELETS: 442 10*3/uL — AB (ref 150–400)
RBC: 3.14 MIL/uL — AB (ref 3.87–5.11)
RDW: 15.9 % — ABNORMAL HIGH (ref 11.5–15.5)
WBC: 33.9 10*3/uL — AB (ref 4.0–10.5)

## 2016-02-29 LAB — GLUCOSE, CAPILLARY
GLUCOSE-CAPILLARY: 101 mg/dL — AB (ref 65–99)
GLUCOSE-CAPILLARY: 121 mg/dL — AB (ref 65–99)
Glucose-Capillary: 102 mg/dL — ABNORMAL HIGH (ref 65–99)
Glucose-Capillary: 104 mg/dL — ABNORMAL HIGH (ref 65–99)

## 2016-02-29 LAB — BASIC METABOLIC PANEL
ANION GAP: 6 (ref 5–15)
Anion gap: 6 (ref 5–15)
BUN: 16 mg/dL (ref 6–20)
BUN: 16 mg/dL (ref 6–20)
CALCIUM: 8.7 mg/dL — AB (ref 8.9–10.3)
CHLORIDE: 117 mmol/L — AB (ref 101–111)
CO2: 24 mmol/L (ref 22–32)
CO2: 22 mmol/L (ref 22–32)
CREATININE: 0.75 mg/dL (ref 0.44–1.00)
Calcium: 8.7 mg/dL — ABNORMAL LOW (ref 8.9–10.3)
Chloride: 116 mmol/L — ABNORMAL HIGH (ref 101–111)
Creatinine, Ser: 0.7 mg/dL (ref 0.44–1.00)
GFR calc Af Amer: >60 mL/min (ref >60)
GFR calc non Af Amer: >60 mL/min (ref >60)
GLUCOSE: 135 mg/dL — AB (ref 65–99)
Glucose, Bld: 108 mg/dL — ABNORMAL HIGH (ref 65–99)
POTASSIUM: 2.7 mmol/L — AB (ref 3.5–5.1)
Potassium: 3.2 mmol/L — ABNORMAL LOW (ref 3.5–5.1)
SODIUM: 147 mmol/L — AB (ref 135–145)
Sodium: 144 mmol/L (ref 135–145)

## 2016-02-29 LAB — LACTIC ACID, PLASMA
LACTIC ACID, VENOUS: 1.2 mmol/L (ref 0.5–1.9)
LACTIC ACID, VENOUS: 1.5 mmol/L (ref 0.5–1.9)

## 2016-02-29 LAB — MAGNESIUM: Magnesium: 1.7 mg/dL (ref 1.7–2.4)

## 2016-02-29 MED ORDER — PIPERACILLIN-TAZOBACTAM 3.375 G IVPB
3.3750 g | Freq: Three times a day (TID) | INTRAVENOUS | Status: DC
  Administered 2016-02-29 – 2016-03-03 (×9): 3.375 g via INTRAVENOUS
  Filled 2016-02-29 (×8): qty 50

## 2016-02-29 MED ORDER — POTASSIUM CHLORIDE 10 MEQ/100ML IV SOLN
10.0000 meq | INTRAVENOUS | Status: AC
  Administered 2016-02-29 (×4): 10 meq via INTRAVENOUS
  Filled 2016-02-29 (×3): qty 100

## 2016-02-29 MED ORDER — POTASSIUM CHLORIDE CRYS ER 20 MEQ PO TBCR: 40.0000 meq | EXTENDED_RELEASE_TABLET | Freq: Once | ORAL | Status: DC

## 2016-02-29 MED ORDER — MAGNESIUM SULFATE 2 GM/50ML IV SOLN
2.0000 g | Freq: Once | INTRAVENOUS | Status: AC
  Administered 2016-02-29: 2 g via INTRAVENOUS
  Filled 2016-02-29 (×2): qty 50

## 2016-02-29 MED ORDER — METOPROLOL TARTRATE 5 MG/5ML IV SOLN
5.0000 mg | Freq: Four times a day (QID) | INTRAVENOUS | Status: DC
  Administered 2016-02-29 (×3): 5 mg via INTRAVENOUS
  Filled 2016-02-29 (×3): qty 5

## 2016-02-29 MED ORDER — POTASSIUM CHLORIDE 10 MEQ/100ML IV SOLN
10.0000 meq | INTRAVENOUS | Status: AC
  Administered 2016-02-29 (×6): 10 meq via INTRAVENOUS
  Filled 2016-02-29 (×6): qty 100

## 2016-02-29 MED ORDER — METOPROLOL TARTRATE 5 MG/5ML IV SOLN
10.0000 mg | Freq: Four times a day (QID) | INTRAVENOUS | Status: DC
  Administered 2016-03-01 – 2016-03-03 (×8): 10 mg via INTRAVENOUS
  Filled 2016-02-29 (×9): qty 10

## 2016-02-29 NOTE — Treatment Plan (Signed)
Called by staff that patient developed "shakes" with increased mental status change. Vital signs obtained. Essentially, pt remains hypertensive, afebrile, mildly tachycardic. Met with family. Concerns for sepsis in the setting of UTI, failing IV abx. Changed abx to zosyn. Also initially added vanc, however family declines this, stating they would not want to risk harming patient's solitary kidney. Sepsis order set initiated. Lactate drawn and is pending.  Updated family at bedside. All are aware of grim prognosis. Outside of room, patient's daughter expressed wishes for hospital death, understanding that patient may not survive transport to residential hospice. Family to notify staff when they are ready to pursue full comfort measures.

## 2016-02-29 NOTE — Progress Notes (Signed)
[facility] PROGRESS NOTE    Progress Note  Katie Dawson  [id number] DOB: 03/12/1934 DOA: 02/18/2016 PCP: Cathlean Cower, MD   Brief Narrative:   Katie Dawson is an 82 y.o. female past medical history significant for metastatic renal cell carcinoma with advanced dementia and recurrent UTIs comes in for lethargy. She is being treated for UTI with bacteremia and has been refusing oral medications. PICC line was placed 9/23 in anticipation of SNF discharge on IV medications as family wants to keep patient full code and under full treatment. Ready for d/c to SNF 9/25 but Na level has been rising and we need to correct it prior to discharge. Today I had a chance to talk to daughter Katie Dawson face to face and we had a long conversation about her mother's situation. I am concerned that we may be forced to do more harm than good if we are not realistic about her illness and prognosis. Palliative care is following.  Assessment/Plan:   Acute kidney injury: Suspect secondary to UTI and probably mild dehydration.with prerenal etiology Patient is continued on IV fluids. Continue IV fluids Renal function remains stable thus far.  Hypernatremia Suspect secondary to decreased by mouth intake. Sodium had peaked to 153 Patient is continued on normal saline. Sodium has improved to 147 this morning.  Will check basic metabolic panel in morning   Encephalopathy superimposed on advanced dementia in the setting of a UTI with ecoli bacteremia: Worse today Possible toxic metabolic in etiology given concurrent UTI Will check CT head without contrast  UTI with e coli bacteremia: Recent urine culture reviewed - organism is pan-sensitive Patient is continued on IV Rocephin Most recent white blood count of 27.3 thousand which is elevated. Leukocytosis worsened to over 33k with encephalopathy Will change abx to zosyn Recheck pan-cultures Obtained CXR which I reviewed. Stable  findings  Hypotension/essential hypertension: Remains poorly controlled Clonidine patch dose was recently increased, hydralazine scheduled.  Continue PRN IV labetalol Have added scheduled IV metoprolol  History of metastatic renal cell carcinoma with lung metastases: Appreciate input by palliative care. There seems to be an element of denial regarding patient's long-term prognosis. Discussed case with palliative care service. Appreciate their assistance. After family meeting this morning, namely has agreed to DO NOT RESUSCITATE status per patient. Family to meet with hospice tomorrow  Diabetes mellitus type 2: Continue holding glipizide continue sliding scale insulin. Blood glucose is well controlled.  Hypokalemia/hypomagnesium -Replaced  RUE swelling -Will check doppler   DVT prophylaxis: lovenox Family Communication: Patient in room, family at bedside  Disposition Plan/Barrier to D/C: Uncertain at this time   Code Status:     Code Status Orders        Start     Ordered   02/18/16 2322  Full code  Continuous     02/18/16 2321    Code Status History    Date Active Date Inactive Code Status Order ID Comments User Context   02/05/2016 12:28 AM 02/09/2016 10:21 PM Full Code [id number]  [name], MD Inpatient   09/13/2015  3:45 PM 09/17/2015  8:36 PM Full Code [id number]  [name], PA-C ED   04/05/2014  6:19 PM 04/07/2014  3:46 PM Full Code [id number]  [name], MD Inpatient   10/16/2013  4:35 PM 10/20/2013  7:17 PM Full Code [id number]  [name], MD Inpatient   05/16/2013 12:05 AM 05/18/2013  8:59 PM Full Code [id number]  [name], MD Inpatient  IV Access:    Peripheral IV   Procedures and diagnostic studies:   Dg Chest Port 1 View  Result Date: 02/29/2016 CLINICAL DATA:  Leukocytosis, UTI. History of renal cancer status post right nephrectomy. EXAM: PORTABLE CHEST 1 VIEW COMPARISON:  02/18/2016 FINDINGS: 4.0 cm left upper  lobe mass, grossly unchanged. Additional 1.7 cm right lower lobe nodule. These findings are compatible with known metastatic disease. No pleural effusion or pneumothorax. Additional mediastinal/hilar lymphadenopathy. Cardiomegaly. Right arm PICC terminates in the upper SVC. Atherosclerotic calcifications of the aortic arch. IMPRESSION: Pulmonary metastases, measuring up to 4.0 cm in the left upper lobe, grossly unchanged. Associated mediastinal/ hilar lymphadenopathy. Right arm PICC terminates in the upper SVC. Aortic atherosclerosis. Electronically Signed   By: Julian Hy M.D.   On: 02/29/2016 12:25     Medical Consultants:    Palliative care  Anti-Infectives:   Rocephin  Subjective:    More confused today  Objective:    Vitals:   02/29/16 0902 02/29/16 0950 02/29/16 1149 02/29/16 1401  BP: (!) 144/54 (!) 134/56 (!) 168/68 (!) 152/58  Pulse: 92  82 77  Resp:   (!) 21 20  Temp: 98.6 F (37 C)  99.6 F (37.6 C) 100.1 F (37.8 C)  TempSrc: Axillary  Axillary Axillary  SpO2: 98%  98% 100%  Weight:      Height:        Intake/Output Summary (Last 24 hours) at 02/29/16 1619 Last data filed at 02/29/16 1500  Gross per 24 hour  Intake             2800 ml  Output             1225 ml  Net             1575 ml   Filed Weights   02/18/16 1250  Weight: 85.3 kg (188 lb)    Exam: General exam: lethargic, laying in bed Respiratory system: mildly increased resp effort, no crackles Cardiovascular system: Regular rhytm, s1, s2 Gastrointestinal system: Obese, soft, no organomegaly Extremities: RUE with increased swelling, no clubbing Skin: IV in place, rashes normal skin turgor  Data Reviewed:    Labs: Basic Metabolic Panel:  Recent Labs Lab 02/25/16 0510 02/26/16 0405 02/27/16 0520 02/27/16 1615 02/28/16 0435 02/29/16 0452  NA 153* 151* 151* 152* 149* 147*  K 3.6 3.2* 3.5  --  3.6 2.7*  CL 120* 120* 119*  --  117* 117*  CO2 26 23 24   --  25 24  GLUCOSE  144* 130* 100*  --  123* 108*  BUN 22* 25* 24*  --  21* 16  CREATININE 0.91 0.99 0.88  --  0.73 0.70  CALCIUM 9.5 9.3 9.1  --  9.0 8.7*  MG  --   --   --   --   --  1.7   GFR Estimated Creatinine Clearance: 61.5 mL/min (by C-G formula based on SCr of 0.7 mg/dL). Liver Function Tests: No results for input(s): AST, ALT, ALKPHOS, BILITOT, PROT, ALBUMIN in the last 168 hours. No results for input(s): LIPASE, AMYLASE in the last 168 hours. No results for input(s): AMMONIA in the last 168 hours. Coagulation profile No results for input(s): INR, PROTIME in the last 168 hours.  CBC:  Recent Labs Lab 02/24/16 0605 02/29/16 0452  WBC 27.3* 33.9*  HGB 10.3* 8.8*  HCT 32.2* 27.9*  MCV 85.9 88.9  PLT 341 442*   Cardiac Enzymes: No results for input(s): CKTOTAL, CKMB, CKMBINDEX,  TROPONINI in the last 168 hours. BNP (last 3 results) No results for input(s): PROBNP in the last 8760 hours. CBG:  Recent Labs Lab 02/28/16 1623 02/28/16 1845 02/28/16 2153 02/29/16 0732 02/29/16 1127  GLUCAP 126* 157* 163* 101* 102*   D-Dimer: No results for input(s): DDIMER in the last 72 hours. Hgb A1c: No results for input(s): HGBA1C in the last 72 hours. Lipid Profile: No results for input(s): CHOL, HDL, LDLCALC, TRIG, CHOLHDL, LDLDIRECT in the last 72 hours. Thyroid function studies: No results for input(s): TSH, T4TOTAL, T3FREE, THYROIDAB in the last 72 hours.  Invalid input(s): FREET3 Anemia work up: No results for input(s): VITAMINB12, FOLATE, FERRITIN, TIBC, IRON, RETICCTPCT in the last 72 hours. Sepsis Labs:  Recent Labs Lab 02/24/16 0605 02/29/16 0452  WBC 27.3* 33.9*   Microbiology Recent Results (from the past 240 hour(s))  Culture, blood (Routine X 2) w Reflex to ID Panel     Status: None   Collection Time: 02/20/16 11:47 AM  Result Value Ref Range Status   Specimen Description BLOOD RIGHT ARM  Final   Special Requests BOTTLES DRAWN AEROBIC AND ANAEROBIC 10CC  Final    Culture   Final    NO GROWTH 5 DAYS Performed at [facility]    Report Status 02/25/2016 FINAL  Final  Culture, blood (Routine X 2) w Reflex to ID Panel     Status: None   Collection Time: 02/20/16 11:48 AM  Result Value Ref Range Status   Specimen Description BLOOD RIGHT HAND  Final   Special Requests IN PEDIATRIC BOTTLE [place]  Final   Culture   Final    NO GROWTH 5 DAYS Performed at [facility]    Report Status 02/25/2016 FINAL  Final     Medications:   . aspirin  300 mg Rectal Daily  . cloNIDine  0.3 mg Transdermal Weekly  . enalaprilat  1.25 mg Intravenous Q6H  . enoxaparin (LOVENOX) injection  40 mg Subcutaneous QHS  . feeding supplement (GLUCERNA SHAKE)  237 mL Oral Q24H  . hydrALAZINE  20 mg Intravenous Q6H  . insulin aspart  0-5 Units Subcutaneous QHS  . insulin aspart  0-9 Units Subcutaneous TID WC  . insulin glargine  10 Units Subcutaneous QHS  . metoprolol  5 mg Intravenous Q6H  . nystatin  1 g Topical QID  . piperacillin-tazobactam (ZOSYN)  IV  3.375 g Intravenous Q8H  . sodium chloride flush  10-40 mL Intracatheter Q12H   Continuous Infusions: . sodium chloride 100 mL/hr at 02/28/16 2347      LOS: 10 days   Hassel Uphoff K  [facility] Pager [phone]  *Please refer to [email or web address], password [email or web address] to get updated schedule on who will round on this patient, as hospitalists switch teams weekly. If 7PM-7AM, please contact night-coverage at [email or web address], password [email or web address] for any overnight needs.  02/29/2016, 4:19 PM

## 2016-02-29 NOTE — Progress Notes (Signed)
LCSWA spoke with Daughter-[name] this a.m. There was miscommunication between CSW and family about time confirmation. LCSWA confirmed again, the family will meet with [facility] tomorrow at 10:00am.

## 2016-02-29 NOTE — Progress Notes (Signed)
Daily Progress Note   Patient Name: Katie Dawson       Date: 02/29/2016 DOB: February 08, 1934  Age: 82 y.o. MRN#: [id number] Attending Physician: Donne Hazel, MD Primary Care Physician: Cathlean Cower, MD Admit Date: 02/18/2016  Reason for Consultation/Follow-up: Establishing goals of care  Subjective: Chart reviewed and discussed acute worsening with Dr. Wyline Copas.  Met with family who was present at that site including one of the patient's 2 daughters. See below for further details conversation.  Length of Stay: 10  Current Medications: Scheduled Meds:  . aspirin  300 mg Rectal Daily  . cloNIDine  0.3 mg Transdermal Weekly  . enalaprilat  1.25 mg Intravenous Q6H  . enoxaparin (LOVENOX) injection  40 mg Subcutaneous QHS  . feeding supplement (GLUCERNA SHAKE)  237 mL Oral Q24H  . hydrALAZINE  20 mg Intravenous Q6H  . insulin aspart  0-5 Units Subcutaneous QHS  . insulin aspart  0-9 Units Subcutaneous TID WC  . insulin glargine  10 Units Subcutaneous QHS  . metoprolol  5 mg Intravenous Q6H  . nystatin  1 g Topical QID  . piperacillin-tazobactam (ZOSYN)  IV  3.375 g Intravenous Q8H  . sodium chloride flush  10-40 mL Intracatheter Q12H    Continuous Infusions: . sodium chloride 100 mL/hr at 02/28/16 2347    PRN Meds: acetaminophen (TYLENOL) oral liquid 160 mg/5 mL, labetalol, lip balm, ondansetron **OR** ondansetron (ZOFRAN) IV, sodium chloride flush  Physical Exam    General exam: In no acute distress, responded to some yes/no questions but did not open eyes throughout encounter Respiratory system: Fair air movement and clear to auscultation. Cardiovascular system: S1 & S2 heard, RRR. No JVD, murmurs, rubs, gallops or clicks.  Gastrointestinal system: Abdomen is nondistended, soft and  nontender.  Extremities: Some pedal edema. Skin: No rashes, lesions or ulcers       Vital Signs: BP (!) 185/70 (BP Location: Left Arm)   Pulse (!) 104   Temp 98.3 F (36.8 C) (Oral)   Resp 20   Ht '5\' 5"'  (1.651 m)   Wt 85.3 kg (188 lb)   SpO2 100%   BMI 31.28 kg/m  SpO2: SpO2: 100 % O2 Device: O2 Device: Not Delivered O2 Flow Rate:    Intake/output summary:   Intake/Output Summary (Last 24 hours) at 02/29/16 1826  Last data filed at 02/29/16 1500  Gross per 24 hour  Intake             2800 ml  Output             1225 ml  Net             1575 ml   LBM: Last BM Date: 02/28/16 Baseline Weight: Weight: 85.3 kg (188 lb) Most recent weight: Weight: 85.3 kg (188 lb)       Palliative Assessment/Data:    Flowsheet Rows   Flowsheet Row Most Recent Value  Intake Tab  Referral Department  Hospitalist  Unit at Time of Referral  Med/Surg Unit  Palliative Care Primary Diagnosis  Cancer  Date Notified  02/18/16  Palliative Care Type  New Palliative care  Reason for referral  Clarify Goals of Care  Date of Admission  02/18/16  Date first seen by Palliative Care  02/19/16  # of days Palliative referral response time  1 Day(s)  # of days IP prior to Palliative referral  0  Clinical Assessment  Palliative Performance Scale Score  30%  Pain Max last 24 hours  Not able to report  Pain Min Last 24 hours  Not able to report  Dyspnea Max Last 24 Hours  Not able to report  Dyspnea Min Last 24 hours  Not able to report  Psychosocial & Spiritual Assessment  Palliative Care Outcomes  Patient/Family meeting held?  Yes  Who was at the meeting?  daughter Becky Sax  Palliative Care Outcomes  Provided end of life care assistance, Provided advance care planning, Provided psychosocial or spiritual support, Counseled regarding hospice  Palliative Care follow-up planned  -- [follow up while at [facility]]      Patient Active Problem List   Diagnosis Date Noted  . Encephalopathy   . Acute-on-chronic  kidney injury ([place])   . Protein-calorie malnutrition, severe 02/21/2016  . Hypokalemia 02/21/2016  . Bacteremia   . Palliative care encounter   . DNR (do not resuscitate) discussion   . Goals of care, counseling/discussion   . DM type 2 causing CKD stage 3 ([place]) 02/18/2016  . Hypotension 02/18/2016  . Metastatic cancer to lung ([place]) 02/08/2016  . Acute encephalopathy   . AKI (acute kidney injury) ([place])   . External hemorrhoid, thrombosed 01/04/2016  . Hematochezia 01/04/2016  . Syncope 10/07/2015  . Lethargy 09/13/2015  . Dizziness 04/05/2014  . Abnormal urine odor 02/07/2014  . Complicated UTI (urinary tract infection) 10/17/2013  . Hypoglycemia 10/16/2013  . Heart murmur 09/16/2013  . Agitation 09/16/2013  . Back pain 05/18/2013  . Altered mental state 05/15/2013  . Lower extremity weakness 05/15/2013  . ARF (acute renal failure) ([place]) 05/15/2013  . Leukocytosis 05/15/2013  . Peripheral neuropathy ([place]) 05/15/2013  . Aortic stenosis 02/10/2013  . Dementia 02/10/2013  . Vitamin D deficiency 04/10/2012  . Depression 04/10/2012  . Confusion 04/08/2012  . Closed fibular fracture 12/24/2011  . Wrist fracture, closed 12/24/2011  . Fatigue 11/05/2010  . Preventative health care 11/03/2010  . INTERMITTENT VERTIGO 04/20/2009  . OSTEOPENIA 02/12/2009  . FOOT PAIN, LEFT 12/23/2007  . Insulin dependent diabetes mellitus ([place]) 04/22/2007  . HLD (hyperlipidemia) 04/22/2007  . GOUT 04/22/2007  . ANEMIA-IRON DEFICIENCY 04/22/2007  . ANXIETY 04/22/2007  . Sleep apnea 04/22/2007  . Essential hypertension 04/22/2007  . GERD 04/22/2007  . Disorder resulting from impaired renal function 04/22/2007  . 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 COLONIC POLYPS, HX OF 04/22/2007  .  NEPHRECTOMY, HX OF 04/22/2007    Palliative Care Assessment & Plan   Recommendations/Plan: Family has been having difficulty processing the fact that Ms. Keilman is coming to the end of her life.   Yesterday, her daughters were much more open to discussion of comfort care and pursuing placement at residential hospice for end-of-life care as they report that she would not want to live her life going back and forth between a hospital and nursing home if she does not have reversible problems from which she can recover.  Family has already met with [facility] to discuss hospice, and asked to revisit to discuss residential hospice placement.  Family reports needing to speak to [facility] liaison prior to making final decision.  Unfortunately, there was confusion over meeting scheduled for this AM and it did not occur.  Meeting scheduled for tomorrow AM.  I discussed with family regarding changes in her condition today.  While they are still [place] to discuss residential hospice with [facility] tomorrow, they want to continue with current care plan as outlined including continuation of Zosyn and plan for CT of the head.   Code Status:    Code Status Orders        Start     Ordered   02/18/16 2322  Full code  Continuous     02/18/16 2321    Code Status History    Date Active Date Inactive Code Status Order ID Comments User Context   02/05/2016 12:28 AM 02/09/2016 10:21 PM Full Code [id number]  [name], MD Inpatient   09/13/2015  3:45 PM 09/17/2015  8:36 PM Full Code [id number]  [name], PA-C ED   04/05/2014  6:19 PM 04/07/2014  3:46 PM Full Code [id number]  [name], MD Inpatient   10/16/2013  4:35 PM 10/20/2013  7:17 PM Full Code [id number]  [name], MD Inpatient   05/16/2013 12:05 AM 05/18/2013  8:59 PM Full Code [id number]  [name], MD Inpatient       Prognosis:   Poor with acute change today. Her prognosis is likely less than 2 weeks regardless of care given moving forward.    Discharge Planning:  To Be Determined  Care plan was discussed with family, Dr. Wyline Copas  Thank you for allowing the Palliative Medicine Team to assist in the care of this patient.   Time  In: 1600 Time Out: 1620 Total Time 20 Prolonged Time Billed No      Greater than 50%  of this time was spent counseling and coordinating care related to the above assessment and plan.  Micheline Rough, MD  Please contact Palliative Medicine Team phone at [phone] for questions and concerns.

## 2016-02-29 NOTE — Progress Notes (Signed)
Pharmacy Antibiotic Note  Katie Dawson is a 82 y.o. female with metastatic renal cell carcinoma admitted on 02/18/2016 lethargy.  Patient is currently being treated with IV ceftriaxone for E.coli bacteremia.  Antibiotic coverage is now being broadened as patient is febrile, WBC rising, repeat blood and urine cultures pending.  Pharmacy is consulted for Zosyn dosing.  Plan:  Zosyn 3.375gm IV q8h (4hr extended infusions)  Follow up repeat cultures/sensitivities for de-escalation as appropriate  Height: 5\' 5"  (165.1 cm) Weight: 188 lb (85.3 kg) IBW/kg (Calculated) : 57  Temp (24hrs), Avg:99.5 F (37.5 C), Min:98.6 F (37 C), Max:100.1 F (37.8 C)   Recent Labs Lab 02/24/16 0605 02/25/16 0510 02/26/16 0405 02/27/16 0520 02/28/16 0435 02/29/16 0452  WBC 27.3*  --   --   --   --  33.9*  CREATININE 0.91 0.91 0.99 0.88 0.73 0.70    Estimated Creatinine Clearance: 61.5 mL/min (by C-G formula based on SCr of 0.7 mg/dL).    No Known Allergies  Antimicrobials this admission:  9/18 Ceftriaxone >> 9/29 9/29 Zosyn >>  Dose adjustments this admission:  9/20 CTX to 2gm q24   Microbiology results:  9/18 BCx: 1/2 E coli (pansensitive) 9/18 BCx: 1/2 Coag neg Staph 9/18 UCx: E coli (pansensitive) 9/20 rpt BCx: NGF 9/29 rpt BCx: sent 9/29 rpt UCx: sent  Thank you for allowing pharmacy to be a part of this patient's care.  Peggyann Juba, PharmD, BCPS Pager: [phone] 02/29/2016 2:49 PM

## 2016-03-01 ENCOUNTER — Inpatient Hospital Stay (HOSPITAL_COMMUNITY): Payer: Medicare Other

## 2016-03-01 DIAGNOSIS — M7989 Other specified soft tissue disorders: Secondary | ICD-10-CM

## 2016-03-01 DIAGNOSIS — D649 Anemia, unspecified: Secondary | ICD-10-CM

## 2016-03-01 DIAGNOSIS — A498 Other bacterial infections of unspecified site: Secondary | ICD-10-CM

## 2016-03-01 DIAGNOSIS — C7800 Secondary malignant neoplasm of unspecified lung: Secondary | ICD-10-CM

## 2016-03-01 DIAGNOSIS — C649 Malignant neoplasm of unspecified kidney, except renal pelvis: Secondary | ICD-10-CM

## 2016-03-01 DIAGNOSIS — L89159 Pressure ulcer of sacral region, unspecified stage: Secondary | ICD-10-CM

## 2016-03-01 DIAGNOSIS — R5383 Other fatigue: Secondary | ICD-10-CM

## 2016-03-01 DIAGNOSIS — I82621 Acute embolism and thrombosis of deep veins of right upper extremity: Secondary | ICD-10-CM

## 2016-03-01 DIAGNOSIS — F0390 Unspecified dementia without behavioral disturbance: Secondary | ICD-10-CM

## 2016-03-01 DIAGNOSIS — M79609 Pain in unspecified limb: Secondary | ICD-10-CM

## 2016-03-01 LAB — CBC WITH DIFFERENTIAL/PLATELET
BASOS PCT: 0 %
Basophils Absolute: 0 10*3/uL (ref 0.0–0.1)
EOS ABS: 0 10*3/uL (ref 0.0–0.7)
EOS PCT: 0 %
HCT: 24.3 % — ABNORMAL LOW (ref 36.0–46.0)
HEMOGLOBIN: 7.7 g/dL — AB (ref 12.0–15.0)
LYMPHS ABS: 1 10*3/uL (ref 0.7–4.0)
Lymphocytes Relative: 3 %
MCH: 27.1 pg (ref 26.0–34.0)
MCHC: 31.7 g/dL (ref 30.0–36.0)
MCV: 85.6 fL (ref 78.0–100.0)
MONO ABS: 0.6 10*3/uL (ref 0.1–1.0)
Monocytes Relative: 2 %
Neutro Abs: 27 10*3/uL — ABNORMAL HIGH (ref 1.7–7.7)
Neutrophils Relative %: 95 %
PLATELETS: 434 10*3/uL — AB (ref 150–400)
RBC: 2.84 MIL/uL — ABNORMAL LOW (ref 3.87–5.11)
RDW: 15.6 % — AB (ref 11.5–15.5)
WBC: 28.6 10*3/uL — AB (ref 4.0–10.5)

## 2016-03-01 LAB — GLUCOSE, CAPILLARY
GLUCOSE-CAPILLARY: 112 mg/dL — AB (ref 65–99)
GLUCOSE-CAPILLARY: 126 mg/dL — AB (ref 65–99)
Glucose-Capillary: 113 mg/dL — ABNORMAL HIGH (ref 65–99)
Glucose-Capillary: 107 mg/dL — ABNORMAL HIGH (ref 65–99)

## 2016-03-01 LAB — COMPREHENSIVE METABOLIC PANEL
ALBUMIN: 1.7 g/dL — AB (ref 3.5–5.0)
ALK PHOS: 165 U/L — AB (ref 38–126)
ALT: 39 U/L (ref 14–54)
ANION GAP: 7 (ref 5–15)
AST: 30 U/L (ref 15–41)
BUN: 19 mg/dL (ref 6–20)
CHLORIDE: 116 mmol/L — AB (ref 101–111)
CO2: 22 mmol/L (ref 22–32)
Calcium: 8.5 mg/dL — ABNORMAL LOW (ref 8.9–10.3)
Creatinine, Ser: 0.76 mg/dL (ref 0.44–1.00)
GFR calc Af Amer: >60 mL/min (ref >60)
GFR calc non Af Amer: >60 mL/min (ref >60)
GLUCOSE: 102 mg/dL — AB (ref 65–99)
POTASSIUM: 3.5 mmol/L (ref 3.5–5.1)
SODIUM: 145 mmol/L (ref 135–145)
Total Bilirubin: 1.1 mg/dL (ref 0.3–1.2)
Total Protein: 5.2 g/dL — ABNORMAL LOW (ref 6.5–8.1)

## 2016-03-01 LAB — URINE CULTURE: Culture: NO GROWTH

## 2016-03-01 MED ORDER — HYDRALAZINE HCL 20 MG/ML IJ SOLN
10.0000 mg | Freq: Four times a day (QID) | INTRAMUSCULAR | Status: DC
  Administered 2016-03-03: 10 mg via INTRAVENOUS
  Filled 2016-03-01: qty 1

## 2016-03-01 NOTE — Progress Notes (Signed)
[facility] - [facility] RN note for [employer] received from [place] to meet with family to give information about [employer] as family is undecided about whether or not they would be interested in [facility] for their mother.    I did meet with two daughters, [name] and another daughter as well as two granddaughters to explain hospice services and provide information as well as offer support.   Education provided to them regarding comfort care vs curative care.   Daughter listened very carefully and seemed very appreciative for the information.  They are familiar with [facility] as they have had family members there in the past.  At this time they were not willing to make a decision; they would like some time to discuss it between themselves and talk with Dr. Domingo Cocking again.     They seem to be aware that there mother is dying and not responding to curative measures; but continue to struggle with stopping treatments.   I have left my contact information with them and they will call if a decision was made.  I will follow up with them again tomorrow; hopefully they will be in agreement .  Of course we will need to verify bed availability if they should decide.   Dr. Domingo Cocking was updated on the conversation.      Mickie Kay, [facility] Liaison  [phone]

## 2016-03-01 NOTE — Progress Notes (Signed)
VASCULAR LAB PRELIMINARY  PRELIMINARY  PRELIMINARY  PRELIMINARY  Right upper extremity venous duplex completed.    Preliminary report: RIGHT- Positive for a deep vein thrombosis forming around the Picc line at the level of the axillary vein. Also noted is a near occlusive superficial thrombus along the basilic vein coursing from just below the [facility] through the upper arm.  Ashea Winiarski, RVS 03/01/2016, 9:10 AM

## 2016-03-01 NOTE — Progress Notes (Signed)
Daily Progress Note   Patient Name: Katie Dawson       Date: 03/01/2016 DOB: [birth year]  Age: 82 y.o. MRN#: [id number] Attending Physician: Donne Hazel, MD Primary Care Physician: Cathlean Cower, MD Admit Date: 02/18/2016  Reason for Consultation/Follow-up: Establishing goals of care  Subjective: Chart reviewed and discussed acute worsening with Dr. Wyline Copas.    Met with family who was present at that site including 3 of the patient's daughters in conjunction with Dr. Wyline Copas. See below for further details conversation.  Length of Stay: 11  Current Medications: Scheduled Meds:  . aspirin  300 mg Rectal Daily  . cloNIDine  0.3 mg Transdermal Weekly  . enalaprilat  1.25 mg Intravenous Q6H  . enoxaparin (LOVENOX) injection  40 mg Subcutaneous QHS  . feeding supplement (GLUCERNA SHAKE)  237 mL Oral Q24H  . hydrALAZINE  10 mg Intravenous Q6H  . insulin aspart  0-5 Units Subcutaneous QHS  . insulin aspart  0-9 Units Subcutaneous TID WC  . insulin glargine  10 Units Subcutaneous QHS  . metoprolol  10 mg Intravenous Q6H  . nystatin  1 g Topical QID  . piperacillin-tazobactam (ZOSYN)  IV  3.375 g Intravenous Q8H  . sodium chloride flush  10-40 mL Intracatheter Q12H    Continuous Infusions: . sodium chloride 100 mL/hr at 03/01/16 1615    PRN Meds: acetaminophen (TYLENOL) oral liquid 160 mg/5 mL, labetalol, lip balm, ondansetron **OR** ondansetron (ZOFRAN) IV, sodium chloride flush  Physical Exam    General exam: In no acute distress, responded to some yes/no questions but did not open eyes throughout encounter Respiratory system: Fair air movement and clear to auscultation. Cardiovascular system: S1 & S2 heard, RRR. No JVD, murmurs, rubs, gallops or clicks.  Gastrointestinal system:  Abdomen is nondistended, soft and nontender.  Extremities: Some pedal edema. Skin: No rashes, lesions or ulcers       Vital Signs: BP (!) 143/48 (BP Location: Left Arm)   Pulse 69   Temp 98.6 F (37 C) (Oral)   Resp 18   Ht _0  (1.651 m)   Wt 85.3 kg (188 lb)   SpO2 97%   BMI 31.28 kg/m  SpO2: SpO2: 97 % O2 Device: O2 Device: Not Delivered O2 Flow Rate:    Intake/output summary:   Intake/Output Summary (Last  24 hours) at 03/01/16 2233 Last data filed at 03/01/16 1700  Gross per 24 hour  Intake                0 ml  Output              400 ml  Net             -400 ml   LBM: Last BM Date: 02/28/16 Baseline Weight: Weight: 85.3 kg (188 lb) Most recent weight: Weight: 85.3 kg (188 lb)       Palliative Assessment/Data:    Flowsheet Rows   Flowsheet Row Most Recent Value  Intake Tab  Referral Department  Hospitalist  Unit at Time of Referral  Med/Surg Unit  Palliative Care Primary Diagnosis  Cancer  Date Notified  02/18/16  Palliative Care Type  New Palliative care  Reason for referral  Clarify Goals of Care  Date of Admission  02/18/16  Date first seen by Palliative Care  02/19/16  # of days Palliative referral response time  1 Day(s)  # of days IP prior to Palliative referral  0  Clinical Assessment  Palliative Performance Scale Score  30%  Pain Max last 24 hours  Not able to report  Pain Min Last 24 hours  Not able to report  Dyspnea Max Last 24 Hours  Not able to report  Dyspnea Min Last 24 hours  Not able to report  Psychosocial & Spiritual Assessment  Palliative Care Outcomes  Patient/Family meeting held?  Yes  Who was at the meeting?  daughter Becky Sax  Palliative Care Outcomes  Provided end of life care assistance, Provided advance care planning, Provided psychosocial or spiritual support, Counseled regarding hospice  Palliative Care follow-up planned  -- [follow up while at [facility]]      Patient Active Problem List   Diagnosis Date Noted  . Encephalopathy    . Acute-on-chronic kidney injury ([place])   . Protein-calorie malnutrition, severe 02/21/2016  . Hypokalemia 02/21/2016  . Bacteremia   . Palliative care encounter   . DNR (do not resuscitate) discussion   . Goals of care, counseling/discussion   . DM type 2 causing CKD stage 3 ([place]) 02/18/2016  . Hypotension 02/18/2016  . Metastatic cancer to lung ([place]) 02/08/2016  . Acute encephalopathy   . AKI (acute kidney injury) ([place])   . External hemorrhoid, thrombosed 01/04/2016  . Hematochezia 01/04/2016  . Syncope 10/07/2015  . Lethargy 09/13/2015  . Dizziness 04/05/2014  . Abnormal urine odor 02/07/2014  . Complicated UTI (urinary tract infection) 10/17/2013  . Hypoglycemia 10/16/2013  . Heart murmur 09/16/2013  . Agitation 09/16/2013  . Back pain 05/18/2013  . Altered mental state 05/15/2013  . Lower extremity weakness 05/15/2013  . ARF (acute renal failure) ([place]) 05/15/2013  . Leukocytosis 05/15/2013  . Peripheral neuropathy ([place]) 05/15/2013  . Aortic stenosis 02/10/2013  . Dementia 02/10/2013  . Vitamin D deficiency 04/10/2012  . Depression 04/10/2012  . Confusion 04/08/2012  . Closed fibular fracture 12/24/2011  . Wrist fracture, closed 12/24/2011  . Fatigue 11/05/2010  . Preventative health care 11/03/2010  . INTERMITTENT VERTIGO 04/20/2009  . OSTEOPENIA 02/12/2009  . FOOT PAIN, LEFT 12/23/2007  . Insulin dependent diabetes mellitus ([place]) 04/22/2007  . HLD (hyperlipidemia) 04/22/2007  . GOUT 04/22/2007  . ANEMIA-IRON DEFICIENCY 04/22/2007  . ANXIETY 04/22/2007  . Sleep apnea 04/22/2007  . Essential hypertension 04/22/2007  . GERD 04/22/2007  . Disorder resulting from impaired renal function 04/22/2007  . OSTEOARTHRITIS, KNEES,  BILATERAL 04/22/2007  . COLONIC POLYPS, HX OF 04/22/2007  . NEPHRECTOMY, HX OF 04/22/2007    Palliative Care Assessment & Plan   Recommendations/Plan: Family has been having difficulty processing the fact that Ms. Feggins is coming to the  end of her life.  Her daughters have been more open to discussion of comfort care and pursuing placement at residential hospice for end-of-life care as they report that she would not want to live her life going back and forth between a hospital and nursing home if she does not have reversible problems from which she can recover.  At the same time, there seems to be concern that, while family does not deny that she is dying, family does not want to stop interventions as they had previously noted this would feel like "giving up."  Family has already met with [facility] to discuss residential hospice this morning.  Family is still trying to process this.  I met with family this afternoon in conjunction with Dr. Wyline Copas to discuss current treatment, newly found DVT, and pathways forward.    Continue current care.  No anticoagulation for DVT.  Family to discuss long term goals.  Will revisit tomorrow.    Code Status:    Code Status Orders        Start     Ordered   02/18/16 2322  Full code  Continuous     02/18/16 2321    Code Status History    Date Active Date Inactive Code Status Order ID Comments User Context   02/05/2016 12:28 AM 02/09/2016 10:21 PM Full Code [id number]  [name], MD Inpatient   09/13/2015  3:45 PM 09/17/2015  8:36 PM Full Code [id number]  [name], PA-C ED   04/05/2014  6:19 PM 04/07/2014  3:46 PM Full Code [id number]  [name], MD Inpatient   10/16/2013  4:35 PM 10/20/2013  7:17 PM Full Code [id number]  [name], MD Inpatient   05/16/2013 12:05 AM 05/18/2013  8:59 PM Full Code [id number]  [name], MD Inpatient       Prognosis:   Her prognosis is likely less than 2 weeks regardless of care given moving forward.    Discharge Planning:  To Be Determined  Care plan was discussed with family, Dr. Wyline Copas  Thank you for allowing the Palliative Medicine Team to assist in the care of this patient.   Time In: 1420 Time Out: 1500 Total Time 40  Prolonged Time Billed No      Greater than 50%  of this time was spent counseling and coordinating care related to the above assessment and plan.  Micheline Rough, MD  Please contact Palliative Medicine Team phone at [phone] for questions and concerns.

## 2016-03-01 NOTE — Progress Notes (Signed)
[facility] PROGRESS NOTE    Progress Note  Katie Dawson  ZDG:[id number] DOB: 29-Jun-1933 DOA: 02/18/2016 PCP: Cathlean Cower, MD   Brief Narrative:   Katie Dawson is an 82 y.o. female past medical history significant for metastatic renal cell carcinoma with advanced dementia and recurrent UTIs comes in for lethargy. She is being treated for UTI with bacteremia and has been refusing oral medications. PICC line was placed 9/23 in anticipation of SNF discharge on IV medications as family wants to keep patient full code and under full treatment. Ready for d/c to SNF 9/25 but Na level has been rising and we need to correct it prior to discharge. Today I had a chance to talk to daughter [name] face to face and we had a long conversation about her mother's situation. I am concerned that we may be forced to do more harm than good if we are not realistic about her illness and prognosis. Palliative care is following.  Assessment/Plan:   Acute kidney injury: -Worsened renal function was likely secondary to UTI and probably mild dehydration.with prerenal etiology -Patient remains on IV fluids, and is presently dependent on them. -Renal function remains stable thus far with continuous IV fluids.  Hypernatremia -Suspect related to poor by mouth intake -Sodium had peaked to 153, now resolved with IV fluids -Repeat basic metabolic panel in the morning   Encephalopathy superimposed on advanced dementia in the setting of a UTI with ecoli bacteremia: -Mental status remains persistently altered -Possible toxic metabolic in etiology given concurrent UTI in the setting of advanced dementia -CT head personally reviewed by myself, with findings of mild chronic ischemic white matter disease otherwise no acute intracranial abnormality.  UTI with e coli bacteremia: -Recent urine culture reviewed  -Escherichia coli noted to be pan-sensitive -Patient was originally continued on Rocephin. However, patient's  condition worsened with increased white blood count 33,000 -Patient's antibiotic was changed to Zosyn. White blood count today 28,000.  Hypotension/essential hypertension: -Blood pressure now much improved. -Continued on clonidine patch today -Continue PRN IV labetalol -Continued on scheduled IV metoprolol with IV enalaprilat  History of metastatic renal cell carcinoma with lung metastases: -Appreciate input by palliative care. There seems to be an element of denial regarding patient's long-term prognosis. -Met with family with palliative care. Patient remains DO NOT RESUSCITATE -I have discussed the case with Dr. Benay Spice who has seen patient in consultation today. Plans for transition to residential hospice tomorrow with full comfort measures at that time.  Diabetes mellitus type 2: Continue holding glipizide continue sliding scale insulin. Blood glucose stable  Hypokalemia/hypomagnesium -Replaced  RUE swelling secondary to upper extremity DVT -DVT is new finding today. -Discussed consideration for anticoagulation with family, who declines giving therapeutic anticoagulation given increased risk over potential benefits. -This afternoon, patient was seen by Dr. Benay Spice who does not recommend a minute of right upper extremity DVT or continuing IV hydration at [facility] place.   DVT prophylaxis: lovenox Family Communication: Patient in room, multiple family members at bedside  Disposition Plan/Barrier to D/C: Uncertain at this time   Code Status:     Code Status Orders        Start     Ordered   02/18/16 2322  Full code  Continuous     02/18/16 2321    Code Status History    Date Active Date Inactive Code Status Order ID Comments User Context   02/05/2016 12:28 AM 02/09/2016 10:21 PM Full Code [id number]  [name], MD  Inpatient   09/13/2015  3:45 PM 09/17/2015  8:36 PM Full Code [id number]  [name], PA-C ED   04/05/2014  6:19 PM 04/07/2014  3:46 PM Full Code  [id number]  [name], MD Inpatient   10/16/2013  4:35 PM 10/20/2013  7:17 PM Full Code [id number]  [name], MD Inpatient   05/16/2013 12:05 AM 05/18/2013  8:59 PM Full Code [id number]  [name], MD Inpatient        IV Access:    Peripheral IV   Procedures and diagnostic studies:   Ct Head Wo Contrast  Result Date: 02/29/2016 CLINICAL DATA:  Encephalopathy. EXAM: CT HEAD WITHOUT CONTRAST TECHNIQUE: Contiguous axial images were obtained from the base of the skull through the vertex without intravenous contrast. COMPARISON:  CT scan of February 18, 2016. FINDINGS: Brain: Mild diffuse cortical atrophy is noted. Mild chronic ischemic white matter disease is noted. No mass effect or midline shift is noted. Ventricular size is within normal limits. There is no evidence of mass lesion, hemorrhage or acute infarction. Vascular: Atherosclerotic calcifications of internal carotid arteries is noted. Skull: Bony calvarium is unremarkable. Sinuses/Orbits: Visualized sinuses appear normal. Other: None. IMPRESSION: Mild diffuse cortical atrophy. Mild chronic ischemic white matter disease. No acute intracranial abnormality seen. Electronically Signed   By: Marijo Conception, M.D.   On: 02/29/2016 17:36   Dg Chest Port 1 View  Result Date: 02/29/2016 CLINICAL DATA:  Leukocytosis, UTI. History of renal cancer status post right nephrectomy. EXAM: PORTABLE CHEST 1 VIEW COMPARISON:  02/18/2016 FINDINGS: 4.0 cm left upper lobe mass, grossly unchanged. Additional 1.7 cm right lower lobe nodule. These findings are compatible with known metastatic disease. No pleural effusion or pneumothorax. Additional mediastinal/hilar lymphadenopathy. Cardiomegaly. Right arm PICC terminates in the upper SVC. Atherosclerotic calcifications of the aortic arch. IMPRESSION: Pulmonary metastases, measuring up to 4.0 cm in the left upper lobe, grossly unchanged. Associated mediastinal/ hilar lymphadenopathy. Right arm  PICC terminates in the upper SVC. Aortic atherosclerosis. Electronically Signed   By: Julian Hy M.D.   On: 02/29/2016 12:25     Medical Consultants:    Palliative care  Oncology  Anti-Infectives:   Rocephin  Subjective:    Unable to obtain given patient's lethargy  Objective:    Vitals:   03/01/16 0630 03/01/16 0938 03/01/16 1144 03/01/16 1424  BP: (!) 118/48 (!) 110/39 (!) 140/43 (!) 153/63  Pulse: 82 61 83 75  Resp: (!) '24  16 18  ' Temp: 98.7 F (37.1 C)   98.6 F (37 C)  TempSrc: Axillary   Oral  SpO2: 99% 98% 98% 97%  Weight:      Height:        Intake/Output Summary (Last 24 hours) at 03/01/16 1709 Last data filed at 03/01/16 6767  Gross per 24 hour  Intake              460 ml  Output              800 ml  Net             -340 ml   Filed Weights   02/18/16 1250  Weight: 85.3 kg (188 lb)    Exam: General exam: Remains lethargic, lying in bed, no acute distress Respiratory system: Normal respiratory effort, no audible wheezing Cardiovascular system: Normal rate, regular rhythm Gastrointestinal system: Positive bowel sounds, no masses Extremities: No cyanosis, no joint deformities Skin: Perfused, no pallor  Data Reviewed:    Labs:  Basic Metabolic Panel:  Recent Labs Lab 02/27/16 0520 02/27/16 1615 02/28/16 0435 02/29/16 0452 02/29/16 1830 03/01/16 0500  NA 151* 152* 149* 147* 144 145  K 3.5  --  3.6 2.7* 3.2* 3.5  CL 119*  --  117* 117* 116* 116*  CO2 24  --  '25 24 22 22  ' GLUCOSE 100*  --  123* 108* 135* 102*  BUN 24*  --  21* '16 16 19  ' CREATININE 0.88  --  0.73 0.70 0.75 0.76  CALCIUM 9.1  --  9.0 8.7* 8.7* 8.5*  MG  --   --   --  1.7  --   --    GFR Estimated Creatinine Clearance: 61.5 mL/min (by C-G formula based on SCr of 0.76 mg/dL). Liver Function Tests:  Recent Labs Lab 03/01/16 0500  AST 30  ALT 39  ALKPHOS 165*  BILITOT 1.1  PROT 5.2*  ALBUMIN 1.7*   No results for input(s): LIPASE, AMYLASE in the last 168  hours. No results for input(s): AMMONIA in the last 168 hours. Coagulation profile No results for input(s): INR, PROTIME in the last 168 hours.  CBC:  Recent Labs Lab 02/24/16 0605 02/29/16 0452 03/01/16 0500  WBC 27.3* 33.9* 28.6*  NEUTROABS  --   --  27.0*  HGB 10.3* 8.8* 7.7*  HCT 32.2* 27.9* 24.3*  MCV 85.9 88.9 85.6  PLT 341 442* 434*   Cardiac Enzymes: No results for input(s): CKTOTAL, CKMB, CKMBINDEX, TROPONINI in the last 168 hours. BNP (last 3 results) No results for input(s): PROBNP in the last 8760 hours. CBG:  Recent Labs Lab 02/29/16 1716 02/29/16 2122 03/01/16 0735 03/01/16 1209 03/01/16 1701  GLUCAP 121* 104* 113* 107* 112*   D-Dimer: No results for input(s): DDIMER in the last 72 hours. Hgb A1c: No results for input(s): HGBA1C in the last 72 hours. Lipid Profile: No results for input(s): CHOL, HDL, LDLCALC, TRIG, CHOLHDL, LDLDIRECT in the last 72 hours. Thyroid function studies: No results for input(s): TSH, T4TOTAL, T3FREE, THYROIDAB in the last 72 hours.  Invalid input(s): FREET3 Anemia work up: No results for input(s): VITAMINB12, FOLATE, FERRITIN, TIBC, IRON, RETICCTPCT in the last 72 hours. Sepsis Labs:  Recent Labs Lab 02/24/16 0605 02/29/16 0452 02/29/16 1823 02/29/16 2135 03/01/16 0500  WBC 27.3* 33.9*  --   --  28.6*  LATICACIDVEN  --   --  1.2 1.5  --    Microbiology Recent Results (from the past 240 hour(s))  Culture, blood (routine x 2)     Status: None (Preliminary result)   Collection Time: 02/29/16  8:36 AM  Result Value Ref Range Status   Specimen Description BLOOD LEFT HAND  Final   Special Requests IN PEDIATRIC BOTTLE 4CC  Final   Culture   Final    NO GROWTH 1 DAY Performed at [facility]    Report Status PENDING  Incomplete  Culture, blood (routine x 2)     Status: None (Preliminary result)   Collection Time: 02/29/16  8:36 AM  Result Value Ref Range Status   Specimen Description BLOOD LEFT HAND   Final   Special Requests BOTTLES DRAWN AEROBIC AND ANAEROBIC [place]  Final   Culture   Final    NO GROWTH 1 DAY Performed at [facility]    Report Status PENDING  Incomplete  Culture, Urine     Status: None   Collection Time: 02/29/16 11:09 AM  Result Value Ref Range Status   Specimen Description  URINE, CATHETERIZED  Final   Special Requests NONE  Final   Culture NO GROWTH Performed at [facility]   Final   Report Status 03/01/2016 FINAL  Final     Medications:   . aspirin  300 mg Rectal Daily  . cloNIDine  0.3 mg Transdermal Weekly  . enalaprilat  1.25 mg Intravenous Q6H  . enoxaparin (LOVENOX) injection  40 mg Subcutaneous QHS  . feeding supplement (GLUCERNA SHAKE)  237 mL Oral Q24H  . hydrALAZINE  10 mg Intravenous Q6H  . insulin aspart  0-5 Units Subcutaneous QHS  . insulin aspart  0-9 Units Subcutaneous TID WC  . insulin glargine  10 Units Subcutaneous QHS  . metoprolol  10 mg Intravenous Q6H  . nystatin  1 g Topical QID  . piperacillin-tazobactam (ZOSYN)  IV  3.375 g Intravenous Q8H  . sodium chloride flush  10-40 mL Intracatheter Q12H   Continuous Infusions: . sodium chloride 100 mL/hr at 03/01/16 1615      LOS: 11 days   Djuana Littleton K  [facility] Pager [phone]  *Please refer to [email or web address], password [email or web address] to get updated schedule on who will round on this patient, as hospitalists switch teams weekly. If 7PM-7AM, please contact night-coverage at [email or web address], password [email or web address] for any overnight needs.  03/01/2016, 5:09 PM

## 2016-03-01 NOTE — Progress Notes (Signed)
IP PROGRESS NOTE  Subjective:   I saw Ms. Cona earlier this month when she was diagnosed with metastatic renal cell carcinoma. I discussed the case with her daughter by telephone. She has severe dementia and is not a candidate for treatment of the renal cell carcinoma. She enrolled in the [facility] hospice program.  She was admitted with Escherichia coli bacteremia on 02/18/2016. She has been treated with antibiotics and was diagnosed with a PICC related right arm DVT 03/01/2016.  The family has met with the medical service and palliative care to discuss end-of-life issues. I was asked to see her to further discuss treatment goals with the family.  Multiple family members are present this afternoon including 2 daughters.   Objective: Vital signs in last 24 hours: Blood pressure (!) 153/63, pulse 75, temperature 98.6 F (37 C), temperature source Oral, resp. rate 18, height '5\' 5"'  (1.651 m), weight 188 lb (85.3 kg), SpO2 97 %.  Intake/Output from previous day: 09/29 0701 - 09/30 0700 In: 1441.7 [P.O.:60; I.V.:1281.7; IV Piggyback:100] Out: 1200 [Urine:1200]  Physical Exam:  Lungs: Clear anteriorly Cardiac: Regular rate and rhythm Abdomen: Soft Extremities: No apparent arm edema Neurologic: She moans with a sternal rub, otherwise not communicating, eyes are closed  Portacath/PICC-without erythema  Lab Results:  Recent Labs  02/29/16 0452 03/01/16 0500  WBC 33.9* 28.6*  HGB 8.8* 7.7*  HCT 27.9* 24.3*  PLT 442* 434*    BMET  Recent Labs  02/29/16 1830 03/01/16 0500  NA 144 145  K 3.2* 3.5  CL 116* 116*  CO2 22 22  GLUCOSE 135* 102*  BUN 16 19  CREATININE 0.75 0.76  CALCIUM 8.7* 8.5*    Studies/Results: Ct Head Wo Contrast  Result Date: 02/29/2016 CLINICAL DATA:  Encephalopathy. EXAM: CT HEAD WITHOUT CONTRAST TECHNIQUE: Contiguous axial images were obtained from the base of the skull through the vertex without intravenous contrast. COMPARISON:  CT scan of  February 18, 2016. FINDINGS: Brain: Mild diffuse cortical atrophy is noted. Mild chronic ischemic white matter disease is noted. No mass effect or midline shift is noted. Ventricular size is within normal limits. There is no evidence of mass lesion, hemorrhage or acute infarction. Vascular: Atherosclerotic calcifications of internal carotid arteries is noted. Skull: Bony calvarium is unremarkable. Sinuses/Orbits: Visualized sinuses appear normal. Other: None. IMPRESSION: Mild diffuse cortical atrophy. Mild chronic ischemic white matter disease. No acute intracranial abnormality seen. Electronically Signed   By: Marijo Conception, M.D.   On: 02/29/2016 17:36   Dg Chest Port 1 View  Result Date: 02/29/2016 CLINICAL DATA:  Leukocytosis, UTI. History of renal cancer status post right nephrectomy. EXAM: PORTABLE CHEST 1 VIEW COMPARISON:  02/18/2016 FINDINGS: 4.0 cm left upper lobe mass, grossly unchanged. Additional 1.7 cm right lower lobe nodule. These findings are compatible with known metastatic disease. No pleural effusion or pneumothorax. Additional mediastinal/hilar lymphadenopathy. Cardiomegaly. Right arm PICC terminates in the upper SVC. Atherosclerotic calcifications of the aortic arch. IMPRESSION: Pulmonary metastases, measuring up to 4.0 cm in the left upper lobe, grossly unchanged. Associated mediastinal/ hilar lymphadenopathy. Right arm PICC terminates in the upper SVC. Aortic atherosclerosis. Electronically Signed   By: Julian Hy M.D.   On: 02/29/2016 12:25    Medications: I have reviewed the patient's current medications.  Assessment/Plan: 1. Metastatic renal cell carcinoma   Bilateral lung masses, chest lymphadenopathy, and a right chest wall mass on CT 02/05/2016-progressive compared to a PET CT from 2015  Ultrasound-guided biopsy of a right chest wall mass  on 02/08/2016 confirmed metastatic renal cell carcinoma 2. history of a right nephrectomy-? For treatment of a malignancy  3.  Dementia 5. Diabetes  6. Urinary tract infection May 2015 7. Anemia  8. Admission with Escherichia coli bacteremia 02/18/2016 9. Sacral decubitus ulcers  Ms. Greear has metastatic renal cell carcinoma and advanced dementia. She is currently admitted with an Escherichia coli urinary tract infection and bacteremia. She has been treated with a course of antibiotics.  I discussed the overall prognosis with her family. She has metastatic renal cell carcinoma in the setting of advanced dementia. She has a poor performance status. The family acknowledges her quality of life is poor. She is not a candidate for treatment of the renal cell carcinoma.  They understand additional invasive medical procedures and diagnostic test are unlikely to improve her quality of life. I recommend comfort care. The daughters appear to understand and agree with a no CODE BLUE status and Hospice care. They appear to be in agreement with transfer to [facility] place for comfort care. I do not recommend treatment of the right upper extremity DVT or continuing intravenous hydration at [facility] place.  Please call Oncology as needed. I will be available to see her at [facility] as needed.     LOS: 11 days   Betsy Coder, MD   03/01/2016, 5:25 PM

## 2016-03-02 LAB — GLUCOSE, CAPILLARY
GLUCOSE-CAPILLARY: 93 mg/dL (ref 65–99)
GLUCOSE-CAPILLARY: 127 mg/dL — AB (ref 65–99)
Glucose-Capillary: 91 mg/dL (ref 65–99)
Glucose-Capillary: 149 mg/dL — ABNORMAL HIGH (ref 65–99)

## 2016-03-02 NOTE — Progress Notes (Signed)
Met briefly with family today.  They report that plan is for transition to [facility].  NO CHARGE VISIT.  Micheline Rough, MD [place] Team [phone]

## 2016-03-02 NOTE — Progress Notes (Signed)
[facility] PROGRESS NOTE    Progress Note  Katie Dawson  [id number] DOB: [birth year] DOA: 02/18/2016 PCP: Cathlean Cower, MD   Brief Narrative:   Katie Dawson is an 82 y.o. female past medical history significant for metastatic renal cell carcinoma with advanced dementia and recurrent UTIs comes in for lethargy. She is being treated for UTI with bacteremia and has been refusing oral medications. PICC line was placed 9/23 in anticipation of SNF discharge on IV medications as family wants to keep patient full code and under full treatment. Ready for d/c to SNF 9/25 but Na level has been rising and we need to correct it prior to discharge. Today I had a chance to talk to daughter Katie Dawson face to face and we had a long conversation about her mother's situation. I am concerned that we may be forced to do more harm than good if we are not realistic about her illness and prognosis. Palliative care is following.  Assessment/Plan:   Acute kidney injury: -Patient earlier noted to have renal function which was likely secondary to UTI and probably mild dehydration.with prerenal etiology -At present, patient remains on IV fluids, and is presently dependent on them. -Renal function remains stable while on continuous IV fluids continue Hypernatremia -Suspect related to poor by mouth intake -Sodium had peaked to 153 -Sodium has normalized with hydration  Encephalopathy superimposed on advanced dementia in the setting of a UTI with ecoli bacteremia: -Mental status remains persistently altered -Suspecting toxic metabolic encephalopathy in etiology given concurrent UTI in the setting of advanced dementia  UTI with e coli bacteremia: -Recent urine culture was reviewed  -Escherichia coli noted to be pan-sensitive -Patient was originally continued on Rocephin. However, patient's condition worsened with increased white blood count 33,000 -Patient has has been continued on Zosyn -Seems stable at  this time  Hypotension/essential hypertension: -Blood pressure now much improved. -We'll continue on clonidine patch today -We'll continue with PRN IV labetalol -Continue on scheduled IV metoprolol with IV enalaprilat  History of metastatic renal cell carcinoma with lung metastases: -Appreciate input by palliative care and oncology. There seems to be an element of denial regarding patient's long-term prognosis. -Patient remains DO NOT RESUSCITATE -Discussed with oncology. Recommendations for full comfort.  Diabetes mellitus type 2: Continue holding glipizide continue sliding scale insulin. Blood glucose stable  Hypokalemia/hypomagnesium -Replaced  RUE swelling secondary to upper extremity DVT -DVT is new finding -Appreciate input by oncology. Dr. Benay Spice does not recommend treating with full anticoagulation. Family also agrees against therapeutic anticoagulation  End-of-life -Per above, patient is DO NOT RESUSCITATE -Initial plans were for transfer [facility] today, however family has requested 1 more day with continued IV fluids with transfer to residential hospice tomorrow instead   DVT prophylaxis: lovenox Family Communication: Patient in room, multiple family members at bedside  Disposition Plan/Barrier to D/C: Uncertain at this time   Code Status:     Code Status Orders        Start     Ordered   02/18/16 2322  Full code  Continuous     02/18/16 2321    Code Status History    Date Active Date Inactive Code Status Order ID Comments User Context   02/05/2016 12:28 AM 02/09/2016 10:21 PM Full Code [id number]  Katie Milan, MD Inpatient   09/13/2015  3:45 PM 09/17/2015  8:36 PM Full Code [id number]  Katie Jumbo, PA-C ED   04/05/2014  6:19 PM 04/07/2014  3:46 PM Full  Code [id number]  Katie Corning, MD Inpatient   10/16/2013  4:35 PM 10/20/2013  7:17 PM Full Code [id number]  Katie Osgood, MD Inpatient   05/16/2013 12:05 AM 05/18/2013  8:59 PM Full Code  [id number]  Katie Patience, MD Inpatient        IV Access:    Peripheral IV   Procedures and diagnostic studies:   Ct Head Wo Contrast  Result Date: 02/29/2016 CLINICAL DATA:  Encephalopathy. EXAM: CT HEAD WITHOUT CONTRAST TECHNIQUE: Contiguous axial images were obtained from the base of the skull through the vertex without intravenous contrast. COMPARISON:  CT scan of February 18, 2016. FINDINGS: Brain: Mild diffuse cortical atrophy is noted. Mild chronic ischemic white matter disease is noted. No mass effect or midline shift is noted. Ventricular size is within normal limits. There is no evidence of mass lesion, hemorrhage or acute infarction. Vascular: Atherosclerotic calcifications of internal carotid arteries is noted. Skull: Bony calvarium is unremarkable. Sinuses/Orbits: Visualized sinuses appear normal. Other: None. IMPRESSION: Mild diffuse cortical atrophy. Mild chronic ischemic white matter disease. No acute intracranial abnormality seen. Electronically Signed   By: Marijo Conception, M.D.   On: 02/29/2016 17:36     Medical Consultants:    Palliative care  Oncology  Anti-Infectives:   Rocephin  Subjective:    Cannot obtain given patient's altered mental status  Objective:    Vitals:   03/01/16 1828 03/01/16 2300 03/02/16 0617 03/02/16 1300  BP: (!) 143/48 (!) 152/52 (!) 168/57 131/65  Pulse: 69 82 79 83  Resp:  18 16 18   Temp:  99.1 F (37.3 C) 99.6 F (37.6 C) 98.3 F (36.8 C)  TempSrc:  Axillary Axillary Oral  SpO2:  98% 98% 100%  Weight:      Height:        Intake/Output Summary (Last 24 hours) at 03/02/16 1515 Last data filed at 03/02/16 1000  Gross per 24 hour  Intake               10 ml  Output              350 ml  Net             -340 ml   Filed Weights   02/18/16 1250  Weight: 85.3 kg (188 lb)    Exam: General exam: Remains confused, lethargic, in no acute distress Respiratory system: Normal chest Katie, no  crackles Cardiovascular system: Perfused, normal rate Gastrointestinal system: Obese, nondistended Extremities: No clubbing, PICC line in place, no cyanosis Skin: No notable skin lesions seen, normal skin turgor  Data Reviewed:    Labs: Basic Metabolic Panel:  Recent Labs Lab 02/27/16 0520 02/27/16 1615 02/28/16 0435 02/29/16 0452 02/29/16 1830 03/01/16 0500  NA 151* 152* 149* 147* 144 145  K 3.5  --  3.6 2.7* 3.2* 3.5  CL 119*  --  117* 117* 116* 116*  CO2 24  --  25 24 22 22   GLUCOSE 100*  --  123* 108* 135* 102*  BUN 24*  --  21* 16 16 19   CREATININE 0.88  --  0.73 0.70 0.75 0.76  CALCIUM 9.1  --  9.0 8.7* 8.7* 8.5*  MG  --   --   --  1.7  --   --    GFR Estimated Creatinine Clearance: 61.5 mL/min (by C-G formula based on SCr of 0.76 mg/dL). Liver Function Tests:  Recent Labs Lab 03/01/16 0500  AST 30  ALT 39  ALKPHOS 165*  BILITOT 1.1  PROT 5.2*  ALBUMIN 1.7*   No results for input(s): LIPASE, AMYLASE in the last 168 hours. No results for input(s): AMMONIA in the last 168 hours. Coagulation profile No results for input(s): INR, PROTIME in the last 168 hours.  CBC:  Recent Labs Lab 02/29/16 0452 03/01/16 0500  WBC 33.9* 28.6*  NEUTROABS  --  27.0*  HGB 8.8* 7.7*  HCT 27.9* 24.3*  MCV 88.9 85.6  PLT 442* 434*   Cardiac Enzymes: No results for input(s): CKTOTAL, CKMB, CKMBINDEX, TROPONINI in the last 168 hours. BNP (last 3 results) No results for input(s): PROBNP in the last 8760 hours. CBG:  Recent Labs Lab 03/01/16 1209 03/01/16 1701 03/01/16 2309 03/02/16 0814 03/02/16 1152  GLUCAP 107* 112* 126* 91 93   D-Dimer: No results for input(s): DDIMER in the last 72 hours. Hgb A1c: No results for input(s): HGBA1C in the last 72 hours. Lipid Profile: No results for input(s): CHOL, HDL, LDLCALC, TRIG, CHOLHDL, LDLDIRECT in the last 72 hours. Thyroid function studies: No results for input(s): TSH, T4TOTAL, T3FREE, THYROIDAB in the last 72  hours.  Invalid input(s): FREET3 Anemia work up: No results for input(s): VITAMINB12, FOLATE, FERRITIN, TIBC, IRON, RETICCTPCT in the last 72 hours. Sepsis Labs:  Recent Labs Lab 02/29/16 0452 02/29/16 1823 02/29/16 2135 03/01/16 0500  WBC 33.9*  --   --  28.6*  LATICACIDVEN  --  1.2 1.5  --    Microbiology Recent Results (from the past 240 hour(s))  Culture, blood (routine x 2)     Status: None (Preliminary result)   Collection Time: 02/29/16  8:36 AM  Result Value Ref Range Status   Specimen Description BLOOD LEFT HAND  Final   Special Requests IN PEDIATRIC BOTTLE 4CC  Final   Culture   Final    NO GROWTH 1 DAY Performed at [facility]    Report Status PENDING  Incomplete  Culture, blood (routine x 2)     Status: None (Preliminary result)   Collection Time: 02/29/16  8:36 AM  Result Value Ref Range Status   Specimen Description BLOOD LEFT HAND  Final   Special Requests BOTTLES DRAWN AEROBIC AND ANAEROBIC [place]  Final   Culture   Final    NO GROWTH 1 DAY Performed at [facility]    Report Status PENDING  Incomplete  Culture, Urine     Status: None   Collection Time: 02/29/16 11:09 AM  Result Value Ref Range Status   Specimen Description URINE, CATHETERIZED  Final   Special Requests NONE  Final   Culture NO GROWTH Performed at [facility]   Final   Report Status 03/01/2016 FINAL  Final     Medications:   . aspirin  300 mg Rectal Daily  . cloNIDine  0.3 mg Transdermal Weekly  . enalaprilat  1.25 mg Intravenous Q6H  . enoxaparin (LOVENOX) injection  40 mg Subcutaneous QHS  . feeding supplement (GLUCERNA SHAKE)  237 mL Oral Q24H  . hydrALAZINE  10 mg Intravenous Q6H  . insulin aspart  0-5 Units Subcutaneous QHS  . insulin aspart  0-9 Units Subcutaneous TID WC  . insulin glargine  10 Units Subcutaneous QHS  . metoprolol  10 mg Intravenous Q6H  . nystatin  1 g Topical QID  . piperacillin-tazobactam (ZOSYN)  IV  3.375 g Intravenous Q8H   . sodium chloride flush  10-40 mL Intracatheter Q12H   Continuous Infusions: . sodium chloride 100  mL/hr at 03/01/16 1615      LOS: 12 days   CHIU, Orpah Melter  [facility] Pager [phone]  *Please refer to [email or web address], password [email or web address] to get updated schedule on who will round on this patient, as hospitalists switch teams weekly. If 7PM-7AM, please contact night-coverage at [email or web address], password [email or web address] for any overnight needs.  03/02/2016, 3:15 PM

## 2016-03-02 NOTE — Progress Notes (Signed)
[facility] RN Note for [employer]   Visit made to meet with family with intention of completing necessary paperwork for transfer to [facility].   Daughter [name] refused stating she needs "one more day in the hospital".  Encouraged family to accept Bed at [facility] while available as we are unable to hold a bed for her  - daughter states "I am willing to take that chance".    Dr. Wyline Copas notified and will f/u with this family.   [facility] Liaison  [phone]

## 2016-03-03 ENCOUNTER — Telehealth: Payer: Self-pay | Admitting: *Deleted

## 2016-03-03 LAB — GLUCOSE, CAPILLARY: GLUCOSE-CAPILLARY: 111 mg/dL — AB (ref 65–99)

## 2016-03-03 MED ORDER — ONDANSETRON HCL 4 MG/2ML IJ SOLN: 4.0000 mg | Freq: Four times a day (QID) | INTRAMUSCULAR | 0 refills | Status: AC | PRN

## 2016-03-03 NOTE — Telephone Encounter (Signed)
Message from [place] at [facility] reporting pt is being transferred there today. Family is requesting Dr. Benay Spice continue as attending. Message forwarded to Dr. Benay Spice.

## 2016-03-03 NOTE — Progress Notes (Signed)
[place] [place] Liaison Visit at 11:00am.   Met with family and completed consents for [facility].  Pt to transfer today. Updated SW Christianne Borrow. [name] on plan to transfer.   Please call with any questions. Thank Dennis Bast Gar Ponto RN  [facility] Liaison    [phone]

## 2016-03-03 NOTE — Progress Notes (Signed)
DNR Form signed and placed.  [facility] called for [employer].  No other needs identified for patient.

## 2016-03-03 NOTE — Progress Notes (Signed)
Report called to Nira Conn, Therapist, sports at [facility]. Patient transported via EMS.

## 2016-03-03 NOTE — Discharge Summary (Signed)
Physician Discharge Summary  Katie Dawson YIF:[id number] DOB: [birth year] DOA: 02/18/2016  PCP: Cathlean Cower, MD  Admit date: 02/18/2016 Discharge date: 03/03/2016  Admitted From: Home Disposition:  [place]  Recommendations for Outpatient Follow-up:  1. Follow up on as needed basis  CODE STATUS:DNR Diet recommendation: comfort   Brief/Interim Summary: 82 y.o. female past medical history significant for metastatic renal cell carcinoma with advanced dementia and recurrent UTIs comes in for lethargy. She is being treated for UTI with bacteremia and has been refusing oral medications. PICC line was placed 9/23 in anticipation of SNF discharge on IV medications as family wants to keep patient full code and under full treatment. Ready for d/c to SNF 9/25 but Na level has been rising and we need to correct it prior to discharge. Today I had a chance to talk to daughter [name] face to face and we had a long conversation about her mother's situation. I am concerned that we may be forced to do more harm than good if we are not realistic about her illness and prognosis. Palliative care is following.  Acute kidney injury: -Patient earlier noted to have renal function which was likely secondary to UTI and probably mild dehydration.with prerenal etiology -At present, patient remains on IV fluids, and is presently dependent on them. -Renal function had remained stable while on continuous IV fluids  Hypernatremia -Suspect related to poor by mouth intake -Sodium had peaked to 153 -Sodium had normalized with hydration  Encephalopathy superimposed on advanced dementia in the setting of a UTI with ecoli bacteremia: -Mental status remains persistently altered -Suspecting toxic metabolic encephalopathy in etiology given concurrent UTI in the setting of advanced dementia  UTI with e coli bacteremia: -Recent urine culture was reviewed  -Escherichia coli noted to be pan-sensitive -Patient was  originally continued on Rocephin. However, patient's condition worsened with increased white blood count 33,000 -Patient has since been continued on Zosyn  Hypotension/essential hypertension: -Blood pressure poorly controlled -while admitted, continued on clonidine patch, PRN IV labetalol, scheduled IV metoprolol with IV enalaprilat -Meds to be stopped on transition to residential hospice  History of metastatic renal cell carcinoma with lung metastases: -Appreciate input by palliative care and oncology. There seems to be an element of denial regarding patient's long-term prognosis. -Patient remains DO NOT RESUSCITATE -Discussed with oncology. Recommendations for full comfort.  Diabetes mellitus type 2: Continued holding glipizide continue sliding scale insulin while admitted.  Hypokalemia/hypomagnesium -Replaced  RUE swelling secondary to upper extremity DVT -DVT is new finding -Appreciate input by oncology. Dr. Benay Spice does not recommend treating with full anticoagulation. Family also agrees against therapeutic anticoagulation -Oncology recommendation to remove PICC  End-of-life -Per above, patient is DO NOT RESUSCITATE -Met with family again today. Plan to [facility] to [facility]. Discussed with Print production planner.  Discharge Diagnoses:  Active Problems:   ANEMIA-IRON DEFICIENCY   Essential hypertension   Leukocytosis   Complicated UTI (urinary tract infection)   Acute encephalopathy   AKI (acute kidney injury) ([place])   DM type 2 causing CKD stage 3 (HCC)   Hypotension   Palliative care encounter   DNR (do not resuscitate) discussion   Goals of care, counseling/discussion   Protein-calorie malnutrition, severe   Bacteremia   Hypokalemia   Acute-on-chronic kidney injury ([place])   Encephalopathy    Discharge Instructions  Discharge Instructions    Diet - low sodium heart healthy    Complete by:  As directed    Increase activity slowly  Complete by:  As  directed        Medication List    STOP taking these medications   acetaminophen 325 MG tablet Commonly known as:  TYLENOL   amLODipine 10 MG tablet Commonly known as:  NORVASC   aspirin EC 81 MG tablet Replaced by:  aspirin 300 MG suppository   atorvastatin 10 MG tablet Commonly known as:  LIPITOR   B-D INS SYRINGE 0.5CC/31GX5/16 31G X 5/16" 0.5 ML Misc Generic drug:  Insulin Syringe-Needle U-100   BD PEN NEEDLE NANO U/F 32G X 4 MM Misc Generic drug:  Insulin Pen Needle   citalopram 10 MG tablet Commonly known as:  CELEXA   cloNIDine 0.1 MG tablet Commonly known as:  CATAPRES   docusate 50 MG/5ML liquid Commonly known as:  COLACE   donepezil 10 MG tablet Commonly known as:  ARICEPT   gabapentin 300 MG capsule Commonly known as:  NEURONTIN   glucose blood test strip Commonly known as:  BAYER CONTOUR NEXT TEST   hydrALAZINE 25 MG tablet Commonly known as:  APRESOLINE   isosorbide mononitrate 60 MG 24 hr tablet Commonly known as:  IMDUR   labetalol 200 MG tablet Commonly known as:  NORMODYNE   Lancets Misc   LANTUS 100 UNIT/ML injection Generic drug:  insulin glargine   NON-ASPIRIN PAIN RELIEF 325 MG tablet Generic drug:  acetaminophen Replaced by:  acetaminophen 160 MG/5ML solution   promethazine 12.5 MG tablet Commonly known as:  PHENERGAN   risperiDONE 0.5 MG tablet Commonly known as:  RISPERDAL     TAKE these medications   acetaminophen 160 MG/5ML solution Commonly known as:  TYLENOL Take 20.3 mLs (650 mg total) by mouth every 6 (six) hours as needed for mild pain or fever. Replaces:  NON-ASPIRIN PAIN RELIEF 325 MG tablet   aspirin 300 MG suppository Place 1 suppository (300 mg total) rectally daily. Replaces:  aspirin EC 81 MG tablet   feeding supplement (GLUCERNA SHAKE) Liqd Take 237 mLs by mouth daily.   nystatin powder Commonly known as:  MYCOSTATIN/NYSTOP Apply 1 g topically 4 (four) times daily.   ondansetron 4 MG/2ML Soln  injection Commonly known as:  ZOFRAN Inject 2 mLs (4 mg total) into the vein every 6 (six) hours as needed for nausea.       No Known Allergies  Consultations:  Palliative Care  Oncology  Procedures/Studies: Ct Head Wo Contrast  Result Date: 02/29/2016 CLINICAL DATA:  Encephalopathy. EXAM: CT HEAD WITHOUT CONTRAST TECHNIQUE: Contiguous axial images were obtained from the base of the skull through the vertex without intravenous contrast. COMPARISON:  CT scan of February 18, 2016. FINDINGS: Brain: Mild diffuse cortical atrophy is noted. Mild chronic ischemic white matter disease is noted. No mass effect or midline shift is noted. Ventricular size is within normal limits. There is no evidence of mass lesion, hemorrhage or acute infarction. Vascular: Atherosclerotic calcifications of internal carotid arteries is noted. Skull: Bony calvarium is unremarkable. Sinuses/Orbits: Visualized sinuses appear normal. Other: None. IMPRESSION: Mild diffuse cortical atrophy. Mild chronic ischemic white matter disease. No acute intracranial abnormality seen. Electronically Signed   By: Marijo Conception, M.D.   On: 02/29/2016 17:36   Ct Head Wo Contrast  Result Date: 02/18/2016 CLINICAL DATA:  Lethargy.  Metastatic disease. EXAM: CT HEAD WITHOUT CONTRAST TECHNIQUE: Contiguous axial images were obtained from the base of the skull through the vertex without intravenous contrast. COMPARISON:  CT head 02/04/2016 FINDINGS: Brain: Moderate atrophy. Low density throughout the cerebral  white matter is unchanged and appears chronic. Negative for mass lesion. No shift of the midline structures. Negative for hemorrhage. No acute infarct. Vascular: No hyperdense vessel or unexpected calcification. Skull: Normal. Negative for fracture or focal lesion. Sinuses/Orbits: No acute finding. Other: None. IMPRESSION: Atrophy and chronic white matter changes are stable. No superimposed acute abnormality. Electronically Signed   By:  Franchot Gallo M.D.   On: 02/18/2016 17:44   Ct Head Wo Contrast  Result Date: 02/04/2016 CLINICAL DATA:  Lethargy.  History of dementia. EXAM: CT HEAD WITHOUT CONTRAST TECHNIQUE: Contiguous axial images were obtained from the base of the skull through the vertex without intravenous contrast. COMPARISON:  September 13, 2015 FINDINGS: Brain: No subdural, epidural, or subarachnoid hemorrhage. Prominence of the ventricles and sulci is stable. The cerebellum, brainstem, and basal cisterns are normal. Moderate two severe white matter changes again identified. No acute cortical ischemia or infarct. No mass, mass effect, or midline shift. Vascular: Atherosclerotic changes seen in the intracranial portions of the carotid arteries. Skull: Normal Sinuses/Orbits: No acute finding. Other: No other abnormalities. IMPRESSION: Moderate to severe white matter changes. No acute abnormalities identified. Electronically Signed   By: Dorise Bullion III M.D   On: 02/04/2016 20:41   Ct Chest Wo Contrast  Result Date: 02/05/2016 CLINICAL DATA:  Left upper lobe enlarging lung mass. EXAM: CT CHEST WITHOUT CONTRAST TECHNIQUE: Multidetector CT imaging of the chest was performed following the standard protocol without IV contrast. COMPARISON:  02/04/2016 and PET-CT 11/09/2013 FINDINGS: Cardiovascular: Aortoiliac atherosclerotic vascular disease. Mediastinum/Nodes: AP window lymph node 3.4 cm in short axis, image 44/3, new compared to prior PET-CT. Left hilar and infrahilar adenopathy is suspected as well. Lungs/Pleura: Left upper lobe mass 3.7 by 2.8 cm on image 20/7, formerly 1.7 by 1.4 cm on 11/09/2013. New satellite nodules observed. Other bilateral pulmonary nodules are present including a 3 point 1 by 2.0 cm mass which previously measured 1.3 by 1.2 cm. A new nodule posteriorly in the left upper lobe measures 1.1 by 0.7 cm. Multiple scattered additional pulmonary nodules are present. Trace bilateral pleural effusions. Upper Abdomen:  Right nephrectomy. Left adrenal mass, 3.2 by 3.6 cm, internal density 34 Hounsfield units, previously 3.4 by 3.2 cm. Vascular calcification of the left renal hilum. Multiple small hyperdense lesions of the left kidney, probably complex cysts but technically nonspecific. Musculoskeletal: Subcutaneous mass along the right upper chest anterior to the pectoralis muscle, 2.1 by 1.2 cm on image 24/3, not present on the prior PET-CT. Lucency in the right proximal humerus possibly related to prior fracture. Thoracic spondylosis. Old left lateral rib fractures. IMPRESSION: 1. New and enlarging pulmonary nodules/pulmonary masses, compatible with progressive metastatic disease. 2. Stable appearance of the left adrenal mass, possibly an adenoma but technically nonspecific based on density. 3. Pathologic mediastinal and hilar adenopathy including a 3.4 cm in short axis AP window lymph node which was not present on prior PET-CT of 11/09/2013. 4. Subcutaneous mass likely a metastatic lesion in the right upper chest anterior to the pectoralis muscle. 5. Suspected small complex cysts of the left kidney. Electronically Signed   By: Van Clines M.D.   On: 02/05/2016 19:43   US Biopsy  Addendum Date: 02/20/2016   ADDENDUM REPORT: 02/20/2016 10:19 ADDENDUM: EXAM section should read as follows: EXAM: EXAM ULTRASOUND BIOPSY Electronically Signed   By: Jacqulynn Cadet M.D.   On: 02/20/2016 10:19   Result Date: 02/20/2016 INDICATION: 82 year old female with newly diagnosed widespread metastatic disease. Patient has a history of  prior renal cell carcinoma in the remote past. Biopsy of superficial subcutaneous nodule of the right chest wall to evaluate tissue diagnosis. EXAM: ULTRASOUND BIOPSY CORE LIVER MEDICATIONS: None. ANESTHESIA/SEDATION: None FLUOROSCOPY TIME:  Fluoroscopy Time: 0 minutes 0 seconds (0 mGy). COMPLICATIONS: None immediate. PROCEDURE: Informed written consent was obtained from the patient after a thorough  discussion of the procedural risks, benefits and alternatives. All questions were addressed. A timeout was performed prior to the initiation of the procedure. Local anesthesia was attained by infiltration with 1% lidocaine. Under real-time sonographic guidance, multiple 18 gauge core biopsies were obtained using the bio Pince automated biopsy device. Needle placement within the subcutaneous soft tissue nodule was confirmed on all biopsy passes. Biopsy specimens were placed in formalin and delivered to pathology for further analysis. Hemostasis was attained by gentle manual pressure. There was no immediate complication. IMPRESSION: Technically successful ultrasound-guided core biopsy of superficial subcutaneous nodule in the anterior right chest wall. Electronically Signed: By: Jacqulynn Cadet M.D. On: 02/08/2016 16:14   Dg Chest Port 1 View  Result Date: 02/29/2016 CLINICAL DATA:  Leukocytosis, UTI. History of renal cancer status post right nephrectomy. EXAM: PORTABLE CHEST 1 VIEW COMPARISON:  02/18/2016 FINDINGS: 4.0 cm left upper lobe mass, grossly unchanged. Additional 1.7 cm right lower lobe nodule. These findings are compatible with known metastatic disease. No pleural effusion or pneumothorax. Additional mediastinal/hilar lymphadenopathy. Cardiomegaly. Right arm PICC terminates in the upper SVC. Atherosclerotic calcifications of the aortic arch. IMPRESSION: Pulmonary metastases, measuring up to 4.0 cm in the left upper lobe, grossly unchanged. Associated mediastinal/ hilar lymphadenopathy. Right arm PICC terminates in the upper SVC. Aortic atherosclerosis. Electronically Signed   By: Julian Hy M.D.   On: 02/29/2016 12:25   Dg Chest Portable 1 View  Result Date: 02/18/2016 CLINICAL DATA:  Altered mental status. History of previous renal cell carcinoma. EXAM: PORTABLE CHEST 1 VIEW COMPARISON:  Chest CT April 06, 2016 FINDINGS: The previously noted mass in the left upper lobe near the apex is  visualize, currently measuring 4.0 x 3.4 cm in size. There is no edema or consolidation. There is bilateral hilar adenopathy, more pronounced on the left than on the right, stable. There are S scattered nodular opacities in both lower lobes, better appreciated on recent CT than on the current radiographic examination. No new opacity is evident. There is atherosclerotic calcification in the aortic arch region. There are postoperative clips in the midline cervical-thoracic region. No bone lesions are evident. IMPRESSION: Evidence of metastatic disease with lung masses and adenopathy, better characterized on recent chest CT. No new opacity evident. No airspace consolidations suggestive of acute pneumonia. There is aortic atherosclerosis. Electronically Signed   By: Lowella Grip III M.D.   On: 02/18/2016 14:15   Dg Chest Port 1 View  Result Date: 02/04/2016 CLINICAL DATA:  Acute onset of altered mental status. Failure to thrive. Initial encounter. EXAM: PORTABLE CHEST 1 VIEW COMPARISON:  Chest radiograph from 09/13/2015 FINDINGS: The left apical mass has increased in size. There is also increased prominence of the left hilum. Findings are suspicious for worsening bronchogenic malignancy and metastatic disease. Known additional bilateral pulmonary nodules are not well seen. No pleural effusion or pneumothorax is seen. The cardiomediastinal silhouette is otherwise unremarkable in appearance. No displaced rib fractures are seen. IMPRESSION: **An incidental finding of potential clinical significance has been found. Left apical mass has increased in size, measuring 4.5 cm. Increased prominence of the left hilum. Findings suspicious for worsening metastatic disease. PET/CT would be  helpful for further evaluation, as deemed clinically appropriate.** Electronically Signed   By: Garald Balding M.D.   On: 02/04/2016 23:26     Subjective: Poorly responsive  Discharge Exam: Vitals:   03/03/16 0527 03/03/16 0700   BP: (!) 177/56 (!) 181/50  Pulse: 80   Resp:    Temp: 98.9 F (37.2 C)    Vitals:   03/02/16 1714 03/02/16 2035 03/03/16 0527 03/03/16 0700  BP: (!) 148/52 (!) 158/78 (!) 177/56 (!) 181/50  Pulse: 74 80 80   Resp:      Temp:  99.1 F (37.3 C) 98.9 F (37.2 C)   TempSrc:  Oral Oral   SpO2:  95% 97%   Weight:      Height:        General: Pt is lethargic, not in acute distress Cardiovascular: Perfused, regular Respiratory: normal resp effort,  no audible rhonchi Abdominal: Nondistended, obese Extremities: no edema, no cyanosis   The results of significant diagnostics from this hospitalization (including imaging, microbiology, ancillary and laboratory) are listed below for reference.     Microbiology: Recent Results (from the past 240 hour(s))  Culture, blood (routine x 2)     Status: None (Preliminary result)   Collection Time: 02/29/16  8:36 AM  Result Value Ref Range Status   Specimen Description BLOOD LEFT HAND  Final   Special Requests IN PEDIATRIC BOTTLE 4CC  Final   Culture   Final    NO GROWTH 2 DAYS Performed at [facility]    Report Status PENDING  Incomplete  Culture, blood (routine x 2)     Status: None (Preliminary result)   Collection Time: 02/29/16  8:36 AM  Result Value Ref Range Status   Specimen Description BLOOD LEFT HAND  Final   Special Requests BOTTLES DRAWN AEROBIC AND ANAEROBIC [place]  Final   Culture   Final    NO GROWTH 2 DAYS Performed at [facility]    Report Status PENDING  Incomplete  Culture, Urine     Status: None   Collection Time: 02/29/16 11:09 AM  Result Value Ref Range Status   Specimen Description URINE, CATHETERIZED  Final   Special Requests NONE  Final   Culture NO GROWTH Performed at [facility]   Final   Report Status 03/01/2016 FINAL  Final     Labs: BNP (last 3 results) No results for input(s): BNP in the last 8760 hours. Basic Metabolic Panel:  Recent Labs Lab 02/27/16 0520  02/27/16 1615 02/28/16 0435 02/29/16 0452 02/29/16 1830 03/01/16 0500  NA 151* 152* 149* 147* 144 145  K 3.5  --  3.6 2.7* 3.2* 3.5  CL 119*  --  117* 117* 116* 116*  CO2 24  --  '25 24 22 22  ' GLUCOSE 100*  --  123* 108* 135* 102*  BUN 24*  --  21* '16 16 19  ' CREATININE 0.88  --  0.73 0.70 0.75 0.76  CALCIUM 9.1  --  9.0 8.7* 8.7* 8.5*  MG  --   --   --  1.7  --   --    Liver Function Tests:  Recent Labs Lab 03/01/16 0500  AST 30  ALT 39  ALKPHOS 165*  BILITOT 1.1  PROT 5.2*  ALBUMIN 1.7*   No results for input(s): LIPASE, AMYLASE in the last 168 hours. No results for input(s): AMMONIA in the last 168 hours. CBC:  Recent Labs Lab 02/29/16 0452 03/01/16 0500  WBC 33.9*  28.6*  NEUTROABS  --  27.0*  HGB 8.8* 7.7*  HCT 27.9* 24.3*  MCV 88.9 85.6  PLT 442* 434*   Cardiac Enzymes: No results for input(s): CKTOTAL, CKMB, CKMBINDEX, TROPONINI in the last 168 hours. BNP: Invalid input(s): POCBNP CBG:  Recent Labs Lab 03/02/16 0814 03/02/16 1152 03/02/16 1630 03/02/16 2140 03/03/16 0752  GLUCAP 91 93 127* 149* 111*   D-Dimer No results for input(s): DDIMER in the last 72 hours. Hgb A1c No results for input(s): HGBA1C in the last 72 hours. Lipid Profile No results for input(s): CHOL, HDL, LDLCALC, TRIG, CHOLHDL, LDLDIRECT in the last 72 hours. Thyroid function studies No results for input(s): TSH, T4TOTAL, T3FREE, THYROIDAB in the last 72 hours.  Invalid input(s): FREET3 Anemia work up No results for input(s): VITAMINB12, FOLATE, FERRITIN, TIBC, IRON, RETICCTPCT in the last 72 hours. Urinalysis    Component Value Date/Time   COLORURINE AMBER (A) 02/18/2016 1334   APPEARANCEUR CLOUDY (A) 02/18/2016 1334   LABSPEC 1.013 02/18/2016 1334   PHURINE 6.0 02/18/2016 1334   GLUCOSEU NEGATIVE 02/18/2016 1334   GLUCOSEU NEGATIVE 02/10/2013 1024   HGBUR NEGATIVE 02/18/2016 1334   BILIRUBINUR NEGATIVE 02/18/2016 1334   KETONESUR NEGATIVE 02/18/2016 1334    PROTEINUR 100 (A) 02/18/2016 1334   UROBILINOGEN 1.0 04/05/2014 1220   NITRITE NEGATIVE 02/18/2016 1334   LEUKOCYTESUR MODERATE (A) 02/18/2016 1334   Sepsis Labs Invalid input(s): PROCALCITONIN,  WBC,  LACTICIDVEN Microbiology Recent Results (from the past 240 hour(s))  Culture, blood (routine x 2)     Status: None (Preliminary result)   Collection Time: 02/29/16  8:36 AM  Result Value Ref Range Status   Specimen Description BLOOD LEFT HAND  Final   Special Requests IN PEDIATRIC BOTTLE 4CC  Final   Culture   Final    NO GROWTH 2 DAYS Performed at [facility]    Report Status PENDING  Incomplete  Culture, blood (routine x 2)     Status: None (Preliminary result)   Collection Time: 02/29/16  8:36 AM  Result Value Ref Range Status   Specimen Description BLOOD LEFT HAND  Final   Special Requests BOTTLES DRAWN AEROBIC AND ANAEROBIC [place]  Final   Culture   Final    NO GROWTH 2 DAYS Performed at [facility]    Report Status PENDING  Incomplete  Culture, Urine     Status: None   Collection Time: 02/29/16 11:09 AM  Result Value Ref Range Status   Specimen Description URINE, CATHETERIZED  Final   Special Requests NONE  Final   Culture NO GROWTH Performed at [facility]   Final   Report Status 03/01/2016 FINAL  Final     SIGNED:   Donne Hazel, MD  [facility] 03/03/2016, 11:13 AM  If 7PM-7AM, please contact night-coverage [email or web address] Password [email or web address]

## 2016-03-03 NOTE — Progress Notes (Signed)
[facility] will assist with patient disposition to [facility]  Per Robinson, patient will have a bed at [facility] today.

## 2016-03-05 LAB — CULTURE, BLOOD (ROUTINE X 2)
Culture: NO GROWTH
Culture: NO GROWTH

## 2016-03-17 ENCOUNTER — Telehealth: Payer: Self-pay | Admitting: Medical Oncology

## 2016-03-25 DIAGNOSIS — F0390 Unspecified dementia without behavioral disturbance: Secondary | ICD-10-CM | POA: Diagnosis not present

## 2016-03-25 DIAGNOSIS — Z794 Long term (current) use of insulin: Secondary | ICD-10-CM

## 2016-03-25 DIAGNOSIS — I10 Essential (primary) hypertension: Secondary | ICD-10-CM | POA: Diagnosis not present

## 2016-03-25 DIAGNOSIS — F329 Major depressive disorder, single episode, unspecified: Secondary | ICD-10-CM

## 2016-03-25 DIAGNOSIS — M6281 Muscle weakness (generalized): Secondary | ICD-10-CM | POA: Diagnosis not present

## 2016-03-25 DIAGNOSIS — E119 Type 2 diabetes mellitus without complications: Secondary | ICD-10-CM | POA: Diagnosis not present

## 2016-03-25 DIAGNOSIS — R293 Abnormal posture: Secondary | ICD-10-CM

## 2016-03-25 DIAGNOSIS — Z7982 Long term (current) use of aspirin: Secondary | ICD-10-CM

## 2016-03-25 DIAGNOSIS — Z905 Acquired absence of kidney: Secondary | ICD-10-CM

## 2016-03-25 DIAGNOSIS — F419 Anxiety disorder, unspecified: Secondary | ICD-10-CM

## 2016-04-02 NOTE — Telephone Encounter (Signed)
Pt died today -on her birthday.Thank Dr Benay Spice for his care.

## 2016-04-02 DEATH — deceased

## 2016-04-03 ENCOUNTER — Ambulatory Visit: Payer: Medicare Other | Admitting: Internal Medicine

## 2016-05-02 IMAGING — CR DG CHEST 2V
1 series · 1 of 1 positions shown · non-contrast
Comparison: Portable chest x-ray October 16, 2013

CLINICAL DATA: Dizziness hypertension nausea and weakness

EXAM:
CHEST  2 VIEW

[view not recorded]
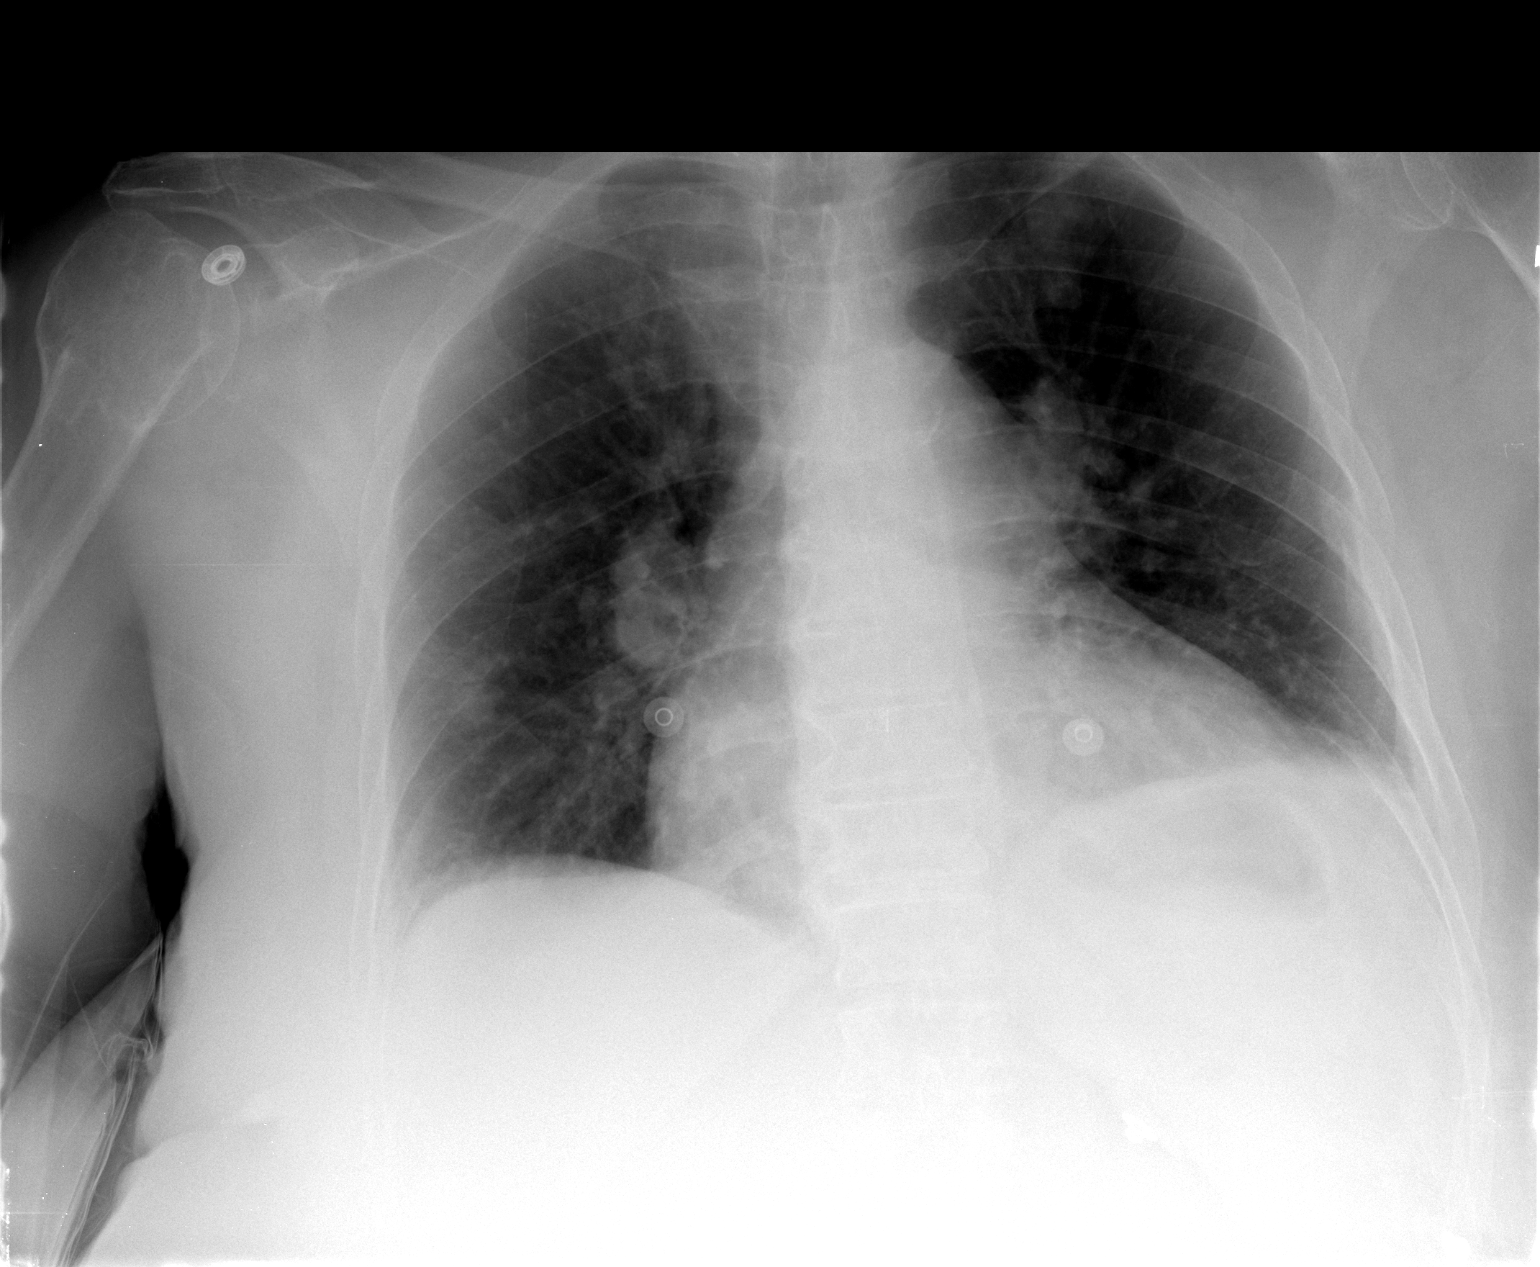

[1 of 1 positions shown; findings below may reference images not displayed]

FINDINGS: The lungs are slightly less well inflated today. Subtle nodularity
projects in the lower third of the right lung. There is soft tissue
fullness in the hilar regions bilaterally which is stable. The
cardiac silhouette is enlarged. The pulmonary vascularity is not
engorged. A trace of pleural fluid. Blunts the lateral costophrenic
angles.
IMPRESSION: There is no evidence of pneumonia nor significant pulmonary edema.
Subtle nodularity projects in the lower third of the right lung and
appears unchanged.
